# Patient Record
Sex: Male | Born: 1945 | Race: Black or African American | Hispanic: No | Marital: Single | Smoking: Current some day smoker
Health system: Southern US, Community
[De-identification: ages and names within clinical notes are randomized; demographics above are authoritative.]

## PROBLEM LIST (undated history)

## (undated) DIAGNOSIS — F1490 Cocaine use, unspecified, uncomplicated: Secondary | ICD-10-CM

## (undated) DIAGNOSIS — I509 Heart failure, unspecified: Secondary | ICD-10-CM

## (undated) DIAGNOSIS — D696 Thrombocytopenia, unspecified: Secondary | ICD-10-CM

## (undated) DIAGNOSIS — I2510 Atherosclerotic heart disease of native coronary artery without angina pectoris: Secondary | ICD-10-CM

## (undated) DIAGNOSIS — B1920 Unspecified viral hepatitis C without hepatic coma: Secondary | ICD-10-CM

## (undated) DIAGNOSIS — I447 Left bundle-branch block, unspecified: Secondary | ICD-10-CM

## (undated) DIAGNOSIS — I428 Other cardiomyopathies: Secondary | ICD-10-CM

## (undated) DIAGNOSIS — I739 Peripheral vascular disease, unspecified: Secondary | ICD-10-CM

## (undated) DIAGNOSIS — E785 Hyperlipidemia, unspecified: Secondary | ICD-10-CM

## (undated) DIAGNOSIS — Z720 Tobacco use: Secondary | ICD-10-CM

## (undated) DIAGNOSIS — I10 Essential (primary) hypertension: Secondary | ICD-10-CM

## (undated) DIAGNOSIS — T7840XA Allergy, unspecified, initial encounter: Secondary | ICD-10-CM

## (undated) HISTORY — DX: Heart failure, unspecified: I50.9

## (undated) HISTORY — DX: Tobacco use: Z72.0

## (undated) HISTORY — DX: Atherosclerotic heart disease of native coronary artery without angina pectoris: I25.10

## (undated) HISTORY — DX: Cocaine use, unspecified, uncomplicated: F14.90

## (undated) HISTORY — DX: Hyperlipidemia, unspecified: E78.5

## (undated) HISTORY — DX: Allergy, unspecified, initial encounter: T78.40XA

## (undated) HISTORY — PX: OTHER SURGICAL HISTORY: SHX169

## (undated) HISTORY — DX: Unspecified viral hepatitis C without hepatic coma: B19.20

## (undated) HISTORY — DX: Thrombocytopenia, unspecified: D69.6

## (undated) HISTORY — DX: Peripheral vascular disease, unspecified: I73.9

## (undated) HISTORY — DX: Essential (primary) hypertension: I10

## (undated) HISTORY — DX: Left bundle-branch block, unspecified: I44.7

---

## 2005-07-04 ENCOUNTER — Encounter
Admission: RE | Admit: 2005-07-04 | Discharge: 2005-10-02 | Payer: Self-pay | Admitting: Physical Medicine & Rehabilitation

## 2005-08-15 ENCOUNTER — Ambulatory Visit: Payer: Self-pay | Admitting: Physical Medicine & Rehabilitation

## 2005-08-25 ENCOUNTER — Ambulatory Visit: Payer: Self-pay | Admitting: Internal Medicine

## 2005-09-04 ENCOUNTER — Emergency Department (HOSPITAL_COMMUNITY): Admission: EM | Admit: 2005-09-04 | Discharge: 2005-09-05 | Payer: Self-pay | Admitting: Emergency Medicine

## 2005-09-15 ENCOUNTER — Ambulatory Visit: Payer: Self-pay | Admitting: Physical Medicine & Rehabilitation

## 2005-10-06 ENCOUNTER — Encounter: Admission: RE | Admit: 2005-10-06 | Discharge: 2006-01-04 | Payer: Self-pay | Admitting: Internal Medicine

## 2005-10-08 ENCOUNTER — Ambulatory Visit: Payer: Self-pay | Admitting: Internal Medicine

## 2005-10-13 ENCOUNTER — Ambulatory Visit: Payer: Self-pay | Admitting: Physical Medicine & Rehabilitation

## 2005-10-13 ENCOUNTER — Encounter
Admission: RE | Admit: 2005-10-13 | Discharge: 2006-01-11 | Payer: Self-pay | Admitting: Physical Medicine & Rehabilitation

## 2005-10-16 ENCOUNTER — Ambulatory Visit: Payer: Self-pay | Admitting: Internal Medicine

## 2005-11-06 ENCOUNTER — Ambulatory Visit: Payer: Self-pay | Admitting: Physical Medicine & Rehabilitation

## 2005-12-02 ENCOUNTER — Ambulatory Visit (HOSPITAL_COMMUNITY): Admission: RE | Admit: 2005-12-02 | Discharge: 2005-12-02 | Payer: Self-pay | Admitting: Internal Medicine

## 2005-12-02 ENCOUNTER — Encounter (INDEPENDENT_AMBULATORY_CARE_PROVIDER_SITE_OTHER): Payer: Self-pay | Admitting: Cardiology

## 2005-12-02 ENCOUNTER — Encounter (INDEPENDENT_AMBULATORY_CARE_PROVIDER_SITE_OTHER): Payer: Self-pay | Admitting: *Deleted

## 2005-12-08 ENCOUNTER — Ambulatory Visit: Payer: Self-pay | Admitting: Physical Medicine & Rehabilitation

## 2006-01-05 ENCOUNTER — Encounter: Admission: RE | Admit: 2006-01-05 | Discharge: 2006-01-13 | Payer: Self-pay | Admitting: Internal Medicine

## 2006-02-04 ENCOUNTER — Encounter
Admission: RE | Admit: 2006-02-04 | Discharge: 2006-05-05 | Payer: Self-pay | Admitting: Physical Medicine & Rehabilitation

## 2006-02-04 ENCOUNTER — Ambulatory Visit: Payer: Self-pay | Admitting: Physical Medicine & Rehabilitation

## 2006-02-19 ENCOUNTER — Ambulatory Visit: Payer: Self-pay | Admitting: Internal Medicine

## 2006-03-12 ENCOUNTER — Ambulatory Visit: Payer: Self-pay | Admitting: Internal Medicine

## 2006-03-31 ENCOUNTER — Ambulatory Visit: Payer: Self-pay | Admitting: Physical Medicine & Rehabilitation

## 2006-04-22 DIAGNOSIS — I10 Essential (primary) hypertension: Secondary | ICD-10-CM | POA: Insufficient documentation

## 2006-04-22 DIAGNOSIS — J45909 Unspecified asthma, uncomplicated: Secondary | ICD-10-CM | POA: Insufficient documentation

## 2006-04-22 DIAGNOSIS — E785 Hyperlipidemia, unspecified: Secondary | ICD-10-CM | POA: Insufficient documentation

## 2006-04-22 DIAGNOSIS — B182 Chronic viral hepatitis C: Secondary | ICD-10-CM | POA: Insufficient documentation

## 2006-05-13 DIAGNOSIS — J309 Allergic rhinitis, unspecified: Secondary | ICD-10-CM | POA: Insufficient documentation

## 2006-05-27 ENCOUNTER — Encounter
Admission: RE | Admit: 2006-05-27 | Discharge: 2006-08-25 | Payer: Self-pay | Admitting: Physical Medicine & Rehabilitation

## 2006-05-27 ENCOUNTER — Ambulatory Visit: Payer: Self-pay | Admitting: Physical Medicine & Rehabilitation

## 2006-07-07 ENCOUNTER — Ambulatory Visit: Payer: Self-pay | Admitting: Gastroenterology

## 2006-07-22 ENCOUNTER — Ambulatory Visit: Payer: Self-pay | Admitting: Physical Medicine & Rehabilitation

## 2006-09-07 ENCOUNTER — Encounter
Admission: RE | Admit: 2006-09-07 | Discharge: 2006-12-06 | Payer: Self-pay | Admitting: Physical Medicine & Rehabilitation

## 2006-09-07 ENCOUNTER — Ambulatory Visit: Payer: Self-pay | Admitting: Physical Medicine & Rehabilitation

## 2006-09-25 ENCOUNTER — Telehealth: Payer: Self-pay | Admitting: *Deleted

## 2006-10-27 ENCOUNTER — Ambulatory Visit: Payer: Self-pay | Admitting: Physical Medicine & Rehabilitation

## 2006-11-03 ENCOUNTER — Encounter (INDEPENDENT_AMBULATORY_CARE_PROVIDER_SITE_OTHER): Payer: Self-pay | Admitting: *Deleted

## 2006-11-30 ENCOUNTER — Encounter
Admission: RE | Admit: 2006-11-30 | Discharge: 2007-02-28 | Payer: Self-pay | Admitting: Physical Medicine & Rehabilitation

## 2006-11-30 ENCOUNTER — Ambulatory Visit: Payer: Self-pay | Admitting: Physical Medicine & Rehabilitation

## 2007-01-04 ENCOUNTER — Ambulatory Visit: Payer: Self-pay | Admitting: Physical Medicine & Rehabilitation

## 2007-01-29 ENCOUNTER — Telehealth (INDEPENDENT_AMBULATORY_CARE_PROVIDER_SITE_OTHER): Payer: Self-pay | Admitting: Pharmacy Technician

## 2007-02-02 ENCOUNTER — Ambulatory Visit: Payer: Self-pay | Admitting: Infectious Disease

## 2007-02-15 ENCOUNTER — Ambulatory Visit: Payer: Self-pay | Admitting: Internal Medicine

## 2007-02-15 ENCOUNTER — Encounter (INDEPENDENT_AMBULATORY_CARE_PROVIDER_SITE_OTHER): Payer: Self-pay | Admitting: *Deleted

## 2007-02-15 LAB — CONVERTED CEMR LAB
ALT: 38 units/L (ref 0–53)
BUN: 11 mg/dL (ref 6–23)
CO2: 27 meq/L (ref 19–32)
Cholesterol: 138 mg/dL (ref 0–200)
Creatinine, Ser: 0.94 mg/dL (ref 0.40–1.50)
HDL: 30 mg/dL — ABNORMAL LOW (ref >39)
Total Bilirubin: 0.6 mg/dL (ref 0.3–1.2)
Total CHOL/HDL Ratio: 4.6
Triglycerides: 122 mg/dL (ref <150)
VLDL: 24 mg/dL (ref 0–40)

## 2007-02-23 ENCOUNTER — Ambulatory Visit: Payer: Self-pay | Admitting: Hospitalist

## 2007-02-23 DIAGNOSIS — F17200 Nicotine dependence, unspecified, uncomplicated: Secondary | ICD-10-CM | POA: Insufficient documentation

## 2007-02-23 DIAGNOSIS — I739 Peripheral vascular disease, unspecified: Secondary | ICD-10-CM | POA: Insufficient documentation

## 2007-03-05 ENCOUNTER — Encounter
Admission: RE | Admit: 2007-03-05 | Discharge: 2007-06-03 | Payer: Self-pay | Admitting: Physical Medicine & Rehabilitation

## 2007-03-05 ENCOUNTER — Ambulatory Visit: Payer: Self-pay | Admitting: Physical Medicine & Rehabilitation

## 2007-05-06 ENCOUNTER — Ambulatory Visit: Payer: Self-pay | Admitting: Physical Medicine & Rehabilitation

## 2007-06-30 ENCOUNTER — Ambulatory Visit: Payer: Self-pay | Admitting: Physical Medicine & Rehabilitation

## 2007-06-30 ENCOUNTER — Encounter
Admission: RE | Admit: 2007-06-30 | Discharge: 2007-09-28 | Payer: Self-pay | Admitting: Physical Medicine & Rehabilitation

## 2007-07-02 ENCOUNTER — Emergency Department (HOSPITAL_COMMUNITY): Admission: EM | Admit: 2007-07-02 | Discharge: 2007-07-02 | Payer: Self-pay | Admitting: Emergency Medicine

## 2007-07-08 ENCOUNTER — Encounter (INDEPENDENT_AMBULATORY_CARE_PROVIDER_SITE_OTHER): Payer: Self-pay | Admitting: *Deleted

## 2007-07-15 ENCOUNTER — Encounter (INDEPENDENT_AMBULATORY_CARE_PROVIDER_SITE_OTHER): Payer: Self-pay | Admitting: *Deleted

## 2007-08-02 ENCOUNTER — Telehealth (INDEPENDENT_AMBULATORY_CARE_PROVIDER_SITE_OTHER): Payer: Self-pay | Admitting: *Deleted

## 2007-08-10 DIAGNOSIS — E119 Type 2 diabetes mellitus without complications: Secondary | ICD-10-CM

## 2007-08-11 ENCOUNTER — Encounter (INDEPENDENT_AMBULATORY_CARE_PROVIDER_SITE_OTHER): Payer: Self-pay | Admitting: *Deleted

## 2007-08-11 ENCOUNTER — Ambulatory Visit: Payer: Self-pay | Admitting: Cardiology

## 2007-08-11 ENCOUNTER — Inpatient Hospital Stay (HOSPITAL_COMMUNITY): Admission: EM | Admit: 2007-08-11 | Discharge: 2007-08-16 | Payer: Self-pay | Admitting: Emergency Medicine

## 2007-08-11 ENCOUNTER — Ambulatory Visit: Payer: Self-pay | Admitting: *Deleted

## 2007-08-24 ENCOUNTER — Ambulatory Visit: Payer: Self-pay | Admitting: Physical Medicine & Rehabilitation

## 2007-08-26 ENCOUNTER — Ambulatory Visit: Payer: Self-pay | Admitting: Hospitalist

## 2007-08-26 ENCOUNTER — Ambulatory Visit: Payer: Self-pay | Admitting: Infectious Diseases

## 2007-08-26 ENCOUNTER — Encounter (INDEPENDENT_AMBULATORY_CARE_PROVIDER_SITE_OTHER): Payer: Self-pay | Admitting: *Deleted

## 2007-08-26 DIAGNOSIS — D696 Thrombocytopenia, unspecified: Secondary | ICD-10-CM

## 2007-08-26 LAB — CONVERTED CEMR LAB
Blood Glucose, Home Monitor: 2 mg/dL
Eosinophils Absolute: 0.3 10*3/uL (ref 0.0–0.7)
Eosinophils Relative: 4 % (ref 0–5)
HCT: 35.2 % — ABNORMAL LOW (ref 39.0–52.0)
Hemoglobin: 11.7 g/dL — ABNORMAL LOW (ref 13.0–17.0)
Lymphocytes Relative: 30 % (ref 12–46)
Lymphs Abs: 2.3 10*3/uL (ref 0.7–4.0)
MCV: 84.2 fL (ref 78.0–100.0)
Monocytes Absolute: 0.6 10*3/uL (ref 0.1–1.0)
Monocytes Relative: 8 % (ref 3–12)
RBC: 4.18 M/uL — ABNORMAL LOW (ref 4.22–5.81)
WBC: 7.7 10*3/uL (ref 4.0–10.5)

## 2007-09-06 ENCOUNTER — Encounter (INDEPENDENT_AMBULATORY_CARE_PROVIDER_SITE_OTHER): Payer: Self-pay | Admitting: *Deleted

## 2007-09-10 ENCOUNTER — Ambulatory Visit (HOSPITAL_COMMUNITY): Admission: RE | Admit: 2007-09-10 | Discharge: 2007-09-10 | Payer: Self-pay | Admitting: Ophthalmology

## 2007-09-14 ENCOUNTER — Encounter (INDEPENDENT_AMBULATORY_CARE_PROVIDER_SITE_OTHER): Payer: Self-pay | Admitting: *Deleted

## 2007-09-23 ENCOUNTER — Encounter (INDEPENDENT_AMBULATORY_CARE_PROVIDER_SITE_OTHER): Payer: Self-pay | Admitting: *Deleted

## 2007-09-23 ENCOUNTER — Ambulatory Visit: Payer: Self-pay | Admitting: Physical Medicine & Rehabilitation

## 2007-09-23 DIAGNOSIS — I509 Heart failure, unspecified: Secondary | ICD-10-CM | POA: Insufficient documentation

## 2007-09-24 ENCOUNTER — Telehealth (INDEPENDENT_AMBULATORY_CARE_PROVIDER_SITE_OTHER): Payer: Self-pay | Admitting: *Deleted

## 2007-09-27 ENCOUNTER — Encounter (INDEPENDENT_AMBULATORY_CARE_PROVIDER_SITE_OTHER): Payer: Self-pay | Admitting: *Deleted

## 2007-10-15 ENCOUNTER — Encounter
Admission: RE | Admit: 2007-10-15 | Discharge: 2008-01-13 | Payer: Self-pay | Admitting: Physical Medicine & Rehabilitation

## 2007-10-19 ENCOUNTER — Ambulatory Visit: Payer: Self-pay | Admitting: Physical Medicine & Rehabilitation

## 2007-10-27 ENCOUNTER — Ambulatory Visit: Payer: Self-pay | Admitting: *Deleted

## 2007-11-16 ENCOUNTER — Ambulatory Visit: Payer: Self-pay | Admitting: Physical Medicine & Rehabilitation

## 2007-11-30 ENCOUNTER — Ambulatory Visit: Payer: Self-pay | Admitting: Internal Medicine

## 2007-12-14 ENCOUNTER — Ambulatory Visit: Payer: Self-pay | Admitting: Physical Medicine & Rehabilitation

## 2007-12-15 ENCOUNTER — Telehealth: Payer: Self-pay | Admitting: *Deleted

## 2008-01-07 ENCOUNTER — Encounter
Admission: RE | Admit: 2008-01-07 | Discharge: 2008-03-08 | Payer: Self-pay | Admitting: Physical Medicine & Rehabilitation

## 2008-01-10 ENCOUNTER — Ambulatory Visit: Payer: Self-pay | Admitting: Physical Medicine & Rehabilitation

## 2008-01-17 ENCOUNTER — Telehealth (INDEPENDENT_AMBULATORY_CARE_PROVIDER_SITE_OTHER): Payer: Self-pay | Admitting: *Deleted

## 2008-01-24 ENCOUNTER — Encounter (INDEPENDENT_AMBULATORY_CARE_PROVIDER_SITE_OTHER): Payer: Self-pay | Admitting: *Deleted

## 2008-01-25 ENCOUNTER — Ambulatory Visit: Payer: Self-pay | Admitting: *Deleted

## 2008-01-25 ENCOUNTER — Telehealth (INDEPENDENT_AMBULATORY_CARE_PROVIDER_SITE_OTHER): Payer: Self-pay | Admitting: *Deleted

## 2008-01-27 ENCOUNTER — Encounter (INDEPENDENT_AMBULATORY_CARE_PROVIDER_SITE_OTHER): Payer: Self-pay | Admitting: *Deleted

## 2008-02-04 ENCOUNTER — Encounter (INDEPENDENT_AMBULATORY_CARE_PROVIDER_SITE_OTHER): Payer: Self-pay | Admitting: *Deleted

## 2008-02-08 ENCOUNTER — Ambulatory Visit: Payer: Self-pay | Admitting: Physical Medicine & Rehabilitation

## 2008-02-18 ENCOUNTER — Encounter (INDEPENDENT_AMBULATORY_CARE_PROVIDER_SITE_OTHER): Payer: Self-pay | Admitting: *Deleted

## 2008-03-01 ENCOUNTER — Encounter (INDEPENDENT_AMBULATORY_CARE_PROVIDER_SITE_OTHER): Payer: Self-pay | Admitting: *Deleted

## 2008-03-08 ENCOUNTER — Ambulatory Visit: Payer: Self-pay | Admitting: Physical Medicine & Rehabilitation

## 2008-03-14 ENCOUNTER — Encounter (INDEPENDENT_AMBULATORY_CARE_PROVIDER_SITE_OTHER): Payer: Self-pay | Admitting: *Deleted

## 2008-04-04 ENCOUNTER — Encounter
Admission: RE | Admit: 2008-04-04 | Discharge: 2008-07-03 | Payer: Self-pay | Admitting: Physical Medicine & Rehabilitation

## 2008-04-05 ENCOUNTER — Ambulatory Visit: Payer: Self-pay | Admitting: Physical Medicine & Rehabilitation

## 2008-05-03 ENCOUNTER — Ambulatory Visit: Payer: Self-pay | Admitting: Physical Medicine & Rehabilitation

## 2008-05-31 ENCOUNTER — Ambulatory Visit: Payer: Self-pay | Admitting: Physical Medicine & Rehabilitation

## 2008-06-08 ENCOUNTER — Encounter (INDEPENDENT_AMBULATORY_CARE_PROVIDER_SITE_OTHER): Payer: Self-pay | Admitting: *Deleted

## 2008-06-30 ENCOUNTER — Telehealth: Payer: Self-pay | Admitting: Infectious Disease

## 2008-07-03 ENCOUNTER — Encounter
Admission: RE | Admit: 2008-07-03 | Discharge: 2008-10-01 | Payer: Self-pay | Admitting: Physical Medicine & Rehabilitation

## 2008-07-04 ENCOUNTER — Ambulatory Visit: Payer: Self-pay | Admitting: Physical Medicine & Rehabilitation

## 2008-07-07 ENCOUNTER — Encounter (INDEPENDENT_AMBULATORY_CARE_PROVIDER_SITE_OTHER): Payer: Self-pay | Admitting: *Deleted

## 2008-07-07 ENCOUNTER — Ambulatory Visit: Payer: Self-pay | Admitting: Internal Medicine

## 2008-07-07 LAB — CONVERTED CEMR LAB
ALT: 42 units/L (ref 0–53)
Albumin: 4 g/dL (ref 3.5–5.2)
Blood Glucose, Fingerstick: 87
CO2: 25 meq/L (ref 19–32)
Cholesterol: 115 mg/dL (ref 0–200)
Glucose, Bld: 86 mg/dL (ref 70–99)
Hgb A1c MFr Bld: 6.3 %
LDL Cholesterol: 52 mg/dL (ref 0–99)
Potassium: 3.9 meq/L (ref 3.5–5.3)
Sodium: 143 meq/L (ref 135–145)
Total Bilirubin: 0.4 mg/dL (ref 0.3–1.2)
Total Protein: 6.6 g/dL (ref 6.0–8.3)
Triglycerides: 162 mg/dL — ABNORMAL HIGH (ref <150)
VLDL: 32 mg/dL (ref 0–40)

## 2008-08-01 ENCOUNTER — Ambulatory Visit: Payer: Self-pay | Admitting: Physical Medicine & Rehabilitation

## 2008-08-07 ENCOUNTER — Encounter (INDEPENDENT_AMBULATORY_CARE_PROVIDER_SITE_OTHER): Payer: Self-pay | Admitting: *Deleted

## 2008-09-01 ENCOUNTER — Ambulatory Visit: Payer: Self-pay | Admitting: Physical Medicine & Rehabilitation

## 2008-09-11 ENCOUNTER — Encounter (INDEPENDENT_AMBULATORY_CARE_PROVIDER_SITE_OTHER): Payer: Self-pay | Admitting: *Deleted

## 2008-09-15 ENCOUNTER — Encounter (INDEPENDENT_AMBULATORY_CARE_PROVIDER_SITE_OTHER): Payer: Self-pay | Admitting: *Deleted

## 2008-10-12 ENCOUNTER — Emergency Department (HOSPITAL_COMMUNITY): Admission: EM | Admit: 2008-10-12 | Discharge: 2008-10-12 | Payer: Self-pay | Admitting: Emergency Medicine

## 2008-10-19 ENCOUNTER — Ambulatory Visit: Payer: Self-pay | Admitting: Infectious Disease

## 2008-10-19 ENCOUNTER — Encounter (INDEPENDENT_AMBULATORY_CARE_PROVIDER_SITE_OTHER): Payer: Self-pay | Admitting: *Deleted

## 2008-10-19 LAB — CONVERTED CEMR LAB
Blood Glucose, Fingerstick: 143
Hgb A1c MFr Bld: 6.3 %

## 2008-10-31 ENCOUNTER — Encounter (INDEPENDENT_AMBULATORY_CARE_PROVIDER_SITE_OTHER): Payer: Self-pay | Admitting: *Deleted

## 2008-11-15 ENCOUNTER — Telehealth (INDEPENDENT_AMBULATORY_CARE_PROVIDER_SITE_OTHER): Payer: Self-pay | Admitting: *Deleted

## 2008-11-16 ENCOUNTER — Encounter (INDEPENDENT_AMBULATORY_CARE_PROVIDER_SITE_OTHER): Payer: Self-pay | Admitting: *Deleted

## 2008-12-19 ENCOUNTER — Telehealth: Payer: Self-pay | Admitting: *Deleted

## 2008-12-19 ENCOUNTER — Encounter: Payer: Self-pay | Admitting: Internal Medicine

## 2009-01-16 ENCOUNTER — Telehealth: Payer: Self-pay | Admitting: *Deleted

## 2009-01-17 ENCOUNTER — Encounter: Payer: Self-pay | Admitting: Internal Medicine

## 2009-01-18 ENCOUNTER — Encounter (INDEPENDENT_AMBULATORY_CARE_PROVIDER_SITE_OTHER): Payer: Self-pay | Admitting: Internal Medicine

## 2009-01-18 ENCOUNTER — Ambulatory Visit: Payer: Self-pay | Admitting: Internal Medicine

## 2009-01-18 LAB — CONVERTED CEMR LAB
Amphetamine Screen, Ur: NEGATIVE
Barbiturate Quant, Ur: NEGATIVE
Benzodiazepines.: NEGATIVE
Cocaine Metabolites: NEGATIVE
Creatinine,U: 50.6 mg/dL
Methadone: NEGATIVE

## 2009-01-22 ENCOUNTER — Encounter (INDEPENDENT_AMBULATORY_CARE_PROVIDER_SITE_OTHER): Payer: Self-pay | Admitting: Internal Medicine

## 2009-02-12 ENCOUNTER — Telehealth (INDEPENDENT_AMBULATORY_CARE_PROVIDER_SITE_OTHER): Payer: Self-pay | Admitting: Internal Medicine

## 2009-02-12 ENCOUNTER — Encounter (INDEPENDENT_AMBULATORY_CARE_PROVIDER_SITE_OTHER): Payer: Self-pay | Admitting: Internal Medicine

## 2009-02-27 ENCOUNTER — Telehealth (INDEPENDENT_AMBULATORY_CARE_PROVIDER_SITE_OTHER): Payer: Self-pay | Admitting: *Deleted

## 2009-03-16 ENCOUNTER — Ambulatory Visit (HOSPITAL_COMMUNITY): Admission: RE | Admit: 2009-03-16 | Discharge: 2009-03-16 | Payer: Self-pay | Admitting: Ophthalmology

## 2009-03-19 ENCOUNTER — Telehealth: Payer: Self-pay | Admitting: *Deleted

## 2009-03-21 ENCOUNTER — Ambulatory Visit: Payer: Self-pay | Admitting: Internal Medicine

## 2009-03-21 LAB — CONVERTED CEMR LAB
Benzodiazepines.: NEGATIVE
Creatinine,U: 79.4 mg/dL
Methadone: NEGATIVE
Phencyclidine (PCP): NEGATIVE
Propoxyphene: NEGATIVE

## 2009-04-11 ENCOUNTER — Encounter: Payer: Self-pay | Admitting: Internal Medicine

## 2009-04-18 ENCOUNTER — Telehealth (INDEPENDENT_AMBULATORY_CARE_PROVIDER_SITE_OTHER): Payer: Self-pay | Admitting: *Deleted

## 2009-04-26 ENCOUNTER — Ambulatory Visit: Payer: Self-pay | Admitting: Infectious Disease

## 2009-04-26 LAB — CONVERTED CEMR LAB
Blood Glucose, Fingerstick: 95
Hgb A1c MFr Bld: 6.3 %

## 2009-05-03 ENCOUNTER — Encounter (INDEPENDENT_AMBULATORY_CARE_PROVIDER_SITE_OTHER): Payer: Self-pay | Admitting: Internal Medicine

## 2009-05-21 ENCOUNTER — Ambulatory Visit: Payer: Self-pay | Admitting: Internal Medicine

## 2009-05-21 ENCOUNTER — Telehealth: Payer: Self-pay | Admitting: Internal Medicine

## 2009-05-21 ENCOUNTER — Encounter: Payer: Self-pay | Admitting: Internal Medicine

## 2009-05-21 LAB — CONVERTED CEMR LAB
Amphetamine Screen, Ur: NEGATIVE
Barbiturate Quant, Ur: NEGATIVE
Benzodiazepines.: NEGATIVE
Creatinine,U: 135.5 mg/dL
Methadone: NEGATIVE
Propoxyphene: NEGATIVE

## 2009-05-23 LAB — CONVERTED CEMR LAB
BUN: 8 mg/dL (ref 6–23)
CO2: 28 meq/L (ref 19–32)
Calcium: 9.2 mg/dL (ref 8.4–10.5)
Creatinine, Ser: 1.09 mg/dL (ref 0.40–1.50)
Glucose, Bld: 83 mg/dL (ref 70–99)
Microalb, Ur: 27.88 mg/dL — ABNORMAL HIGH (ref 0.00–1.89)

## 2009-06-12 ENCOUNTER — Encounter: Payer: Self-pay | Admitting: Internal Medicine

## 2009-06-19 ENCOUNTER — Telehealth (INDEPENDENT_AMBULATORY_CARE_PROVIDER_SITE_OTHER): Payer: Self-pay | Admitting: *Deleted

## 2009-07-17 ENCOUNTER — Telehealth: Payer: Self-pay | Admitting: Internal Medicine

## 2009-07-19 ENCOUNTER — Encounter (INDEPENDENT_AMBULATORY_CARE_PROVIDER_SITE_OTHER): Payer: Self-pay | Admitting: Internal Medicine

## 2009-07-20 ENCOUNTER — Encounter (INDEPENDENT_AMBULATORY_CARE_PROVIDER_SITE_OTHER): Payer: Self-pay | Admitting: Internal Medicine

## 2009-07-20 ENCOUNTER — Encounter: Payer: Self-pay | Admitting: Internal Medicine

## 2009-08-16 ENCOUNTER — Telehealth: Payer: Self-pay | Admitting: *Deleted

## 2009-08-17 ENCOUNTER — Telehealth (INDEPENDENT_AMBULATORY_CARE_PROVIDER_SITE_OTHER): Payer: Self-pay | Admitting: Internal Medicine

## 2009-08-20 ENCOUNTER — Encounter: Payer: Self-pay | Admitting: Internal Medicine

## 2009-08-20 ENCOUNTER — Telehealth (INDEPENDENT_AMBULATORY_CARE_PROVIDER_SITE_OTHER): Payer: Self-pay | Admitting: *Deleted

## 2009-09-07 ENCOUNTER — Ambulatory Visit: Payer: Self-pay | Admitting: Internal Medicine

## 2009-09-07 ENCOUNTER — Encounter (INDEPENDENT_AMBULATORY_CARE_PROVIDER_SITE_OTHER): Payer: Self-pay | Admitting: Internal Medicine

## 2009-09-07 ENCOUNTER — Encounter (INDEPENDENT_AMBULATORY_CARE_PROVIDER_SITE_OTHER): Payer: Self-pay | Admitting: *Deleted

## 2009-09-07 LAB — CONVERTED CEMR LAB
BUN: 6 mg/dL (ref 6–23)
Blood Glucose, Fingerstick: 81
Calcium: 8.6 mg/dL (ref 8.4–10.5)
Chloride: 105 meq/L (ref 96–112)
Creatinine, Ser: 1.09 mg/dL (ref 0.40–1.50)
Creatinine,U: 59.3 mg/dL
Hgb A1c MFr Bld: 7.4 %
Opiates: POSITIVE — AB
Phencyclidine (PCP): NEGATIVE
Propoxyphene: NEGATIVE

## 2009-09-18 ENCOUNTER — Ambulatory Visit: Payer: Self-pay | Admitting: Internal Medicine

## 2009-09-20 ENCOUNTER — Ambulatory Visit: Payer: Self-pay | Admitting: Internal Medicine

## 2009-09-20 ENCOUNTER — Encounter (INDEPENDENT_AMBULATORY_CARE_PROVIDER_SITE_OTHER): Payer: Self-pay | Admitting: *Deleted

## 2009-09-26 ENCOUNTER — Encounter (INDEPENDENT_AMBULATORY_CARE_PROVIDER_SITE_OTHER): Payer: Self-pay | Admitting: *Deleted

## 2009-09-28 ENCOUNTER — Encounter: Payer: Self-pay | Admitting: Internal Medicine

## 2009-09-28 ENCOUNTER — Telehealth (INDEPENDENT_AMBULATORY_CARE_PROVIDER_SITE_OTHER): Payer: Self-pay | Admitting: *Deleted

## 2009-10-01 ENCOUNTER — Encounter: Payer: Self-pay | Admitting: Internal Medicine

## 2009-10-01 ENCOUNTER — Telehealth: Payer: Self-pay | Admitting: Internal Medicine

## 2009-10-02 ENCOUNTER — Ambulatory Visit: Payer: Self-pay | Admitting: Internal Medicine

## 2009-10-02 LAB — CONVERTED CEMR LAB: Pro B Natriuretic peptide (BNP): 160 pg/mL — ABNORMAL HIGH (ref 0.0–100.0)

## 2009-10-03 ENCOUNTER — Encounter (INDEPENDENT_AMBULATORY_CARE_PROVIDER_SITE_OTHER): Payer: Self-pay | Admitting: Internal Medicine

## 2009-10-09 ENCOUNTER — Ambulatory Visit: Payer: Self-pay | Admitting: Internal Medicine

## 2009-10-09 ENCOUNTER — Encounter (INDEPENDENT_AMBULATORY_CARE_PROVIDER_SITE_OTHER): Payer: Self-pay | Admitting: Internal Medicine

## 2009-10-09 ENCOUNTER — Encounter: Payer: Self-pay | Admitting: Internal Medicine

## 2009-10-09 ENCOUNTER — Ambulatory Visit (HOSPITAL_COMMUNITY): Admission: RE | Admit: 2009-10-09 | Discharge: 2009-10-09 | Payer: Self-pay | Admitting: Internal Medicine

## 2009-10-09 ENCOUNTER — Ambulatory Visit: Payer: Self-pay | Admitting: Vascular Surgery

## 2009-10-10 ENCOUNTER — Encounter (INDEPENDENT_AMBULATORY_CARE_PROVIDER_SITE_OTHER): Payer: Self-pay | Admitting: *Deleted

## 2009-10-10 ENCOUNTER — Encounter (INDEPENDENT_AMBULATORY_CARE_PROVIDER_SITE_OTHER): Payer: Self-pay | Admitting: Internal Medicine

## 2009-10-10 ENCOUNTER — Encounter: Payer: Self-pay | Admitting: Internal Medicine

## 2009-10-17 ENCOUNTER — Encounter (INDEPENDENT_AMBULATORY_CARE_PROVIDER_SITE_OTHER): Payer: Self-pay | Admitting: Internal Medicine

## 2009-10-17 ENCOUNTER — Ambulatory Visit: Payer: Self-pay | Admitting: Internal Medicine

## 2009-10-18 ENCOUNTER — Telehealth: Payer: Self-pay | Admitting: *Deleted

## 2009-10-19 ENCOUNTER — Ambulatory Visit: Payer: Self-pay | Admitting: Internal Medicine

## 2009-10-19 LAB — CONVERTED CEMR LAB
Amphetamine Screen, Ur: NEGATIVE
Barbiturate Quant, Ur: NEGATIVE
Benzodiazepines.: NEGATIVE
Cocaine Metabolites: NEGATIVE
Creatinine,U: 180.2 mg/dL
Marijuana Metabolite: NEGATIVE
Methadone: NEGATIVE
Opiates: POSITIVE — AB

## 2009-10-23 ENCOUNTER — Ambulatory Visit: Payer: Self-pay | Admitting: Internal Medicine

## 2009-10-25 ENCOUNTER — Ambulatory Visit (HOSPITAL_COMMUNITY): Admission: RE | Admit: 2009-10-25 | Discharge: 2009-10-25 | Payer: Self-pay | Admitting: Internal Medicine

## 2009-10-30 ENCOUNTER — Encounter (INDEPENDENT_AMBULATORY_CARE_PROVIDER_SITE_OTHER): Payer: Self-pay | Admitting: *Deleted

## 2009-11-02 ENCOUNTER — Ambulatory Visit: Payer: Self-pay | Admitting: Internal Medicine

## 2009-11-12 ENCOUNTER — Telehealth (INDEPENDENT_AMBULATORY_CARE_PROVIDER_SITE_OTHER): Payer: Self-pay | Admitting: *Deleted

## 2009-11-14 ENCOUNTER — Telehealth (INDEPENDENT_AMBULATORY_CARE_PROVIDER_SITE_OTHER): Payer: Self-pay | Admitting: Internal Medicine

## 2009-11-16 ENCOUNTER — Encounter (INDEPENDENT_AMBULATORY_CARE_PROVIDER_SITE_OTHER): Payer: Self-pay | Admitting: Internal Medicine

## 2009-12-05 ENCOUNTER — Telehealth: Payer: Self-pay | Admitting: Internal Medicine

## 2009-12-17 ENCOUNTER — Telehealth: Payer: Self-pay | Admitting: Internal Medicine

## 2009-12-18 ENCOUNTER — Encounter (INDEPENDENT_AMBULATORY_CARE_PROVIDER_SITE_OTHER): Payer: Self-pay | Admitting: *Deleted

## 2009-12-19 ENCOUNTER — Encounter: Payer: Self-pay | Admitting: Internal Medicine

## 2010-01-03 ENCOUNTER — Telehealth: Payer: Self-pay | Admitting: Internal Medicine

## 2010-01-08 ENCOUNTER — Encounter: Payer: Self-pay | Admitting: Internal Medicine

## 2010-01-16 ENCOUNTER — Telehealth: Payer: Self-pay | Admitting: Internal Medicine

## 2010-01-18 ENCOUNTER — Encounter: Payer: Self-pay | Admitting: Internal Medicine

## 2010-01-25 ENCOUNTER — Encounter: Payer: Self-pay | Admitting: Internal Medicine

## 2010-02-15 ENCOUNTER — Telehealth: Payer: Self-pay | Admitting: Internal Medicine

## 2010-02-15 ENCOUNTER — Encounter: Payer: Self-pay | Admitting: Internal Medicine

## 2010-02-28 ENCOUNTER — Telehealth: Payer: Self-pay | Admitting: Internal Medicine

## 2010-03-04 ENCOUNTER — Ambulatory Visit: Payer: Self-pay | Admitting: Internal Medicine

## 2010-03-04 LAB — CONVERTED CEMR LAB
AST: 42 units/L — ABNORMAL HIGH (ref 0–37)
Albumin: 3.7 g/dL (ref 3.5–5.2)
Amphetamine Screen, Ur: NEGATIVE
BUN: 6 mg/dL (ref 6–23)
Barbiturate Quant, Ur: NEGATIVE
Calcium: 9.1 mg/dL (ref 8.4–10.5)
Chloride: 103 meq/L (ref 96–112)
Cocaine Metabolites: NEGATIVE
Creatinine, Ser: 1.02 mg/dL (ref 0.40–1.50)
Eosinophils Relative: 5 % (ref 0–5)
Glucose, Bld: 122 mg/dL — ABNORMAL HIGH (ref 70–99)
HCT: 44.2 % (ref 39.0–52.0)
Hemoglobin: 14.4 g/dL (ref 13.0–17.0)
Hgb A1c MFr Bld: 7.5 %
Lymphocytes Relative: 43 % (ref 12–46)
Lymphs Abs: 3.7 10*3/uL (ref 0.7–4.0)
Marijuana Metabolite: NEGATIVE
Methadone: NEGATIVE
Monocytes Absolute: 0.7 10*3/uL (ref 0.1–1.0)
Monocytes Relative: 9 % (ref 3–12)
Neutro Abs: 3.7 10*3/uL (ref 1.7–7.7)
Opiates: POSITIVE — AB
Potassium: 3.6 meq/L (ref 3.5–5.3)
RBC: 5.49 M/uL (ref 4.22–5.81)
RDW: 14.1 % (ref 11.5–15.5)

## 2010-03-12 ENCOUNTER — Telehealth: Payer: Self-pay | Admitting: Internal Medicine

## 2010-03-18 ENCOUNTER — Telehealth: Payer: Self-pay | Admitting: Internal Medicine

## 2010-03-18 ENCOUNTER — Encounter: Payer: Self-pay | Admitting: Internal Medicine

## 2010-03-22 ENCOUNTER — Telehealth: Payer: Self-pay | Admitting: Internal Medicine

## 2010-03-28 ENCOUNTER — Telehealth: Payer: Self-pay | Admitting: Internal Medicine

## 2010-04-08 ENCOUNTER — Ambulatory Visit: Payer: Self-pay | Admitting: Internal Medicine

## 2010-04-08 LAB — CONVERTED CEMR LAB
Amphetamine Screen, Ur: NEGATIVE
Barbiturate Quant, Ur: NEGATIVE
Benzodiazepines.: NEGATIVE
Benzodiazepines.: NEGATIVE
Cocaine Metabolites: NEGATIVE
Methadone: NEGATIVE
Opiates: POSITIVE
Phencyclidine (PCP): NEGATIVE
Propoxyphene: NEGATIVE
Specific Gravity, Urine: NORMAL
pH: NORMAL

## 2010-04-09 ENCOUNTER — Encounter: Payer: Self-pay | Admitting: Internal Medicine

## 2010-04-11 ENCOUNTER — Telehealth: Payer: Self-pay | Admitting: Internal Medicine

## 2010-04-11 ENCOUNTER — Ambulatory Visit: Admission: RE | Admit: 2010-04-11 | Discharge: 2010-04-11 | Payer: Self-pay | Admitting: Internal Medicine

## 2010-04-11 ENCOUNTER — Encounter: Payer: Self-pay | Admitting: Internal Medicine

## 2010-04-11 ENCOUNTER — Ambulatory Visit: Payer: Self-pay | Admitting: Surgery

## 2010-04-11 ENCOUNTER — Ambulatory Visit: Payer: Self-pay | Admitting: Internal Medicine

## 2010-04-11 LAB — CONVERTED CEMR LAB
MCHC: 33.7 g/dL (ref 30.0–36.0)
MCV: 80 fL (ref >=78.0)
Platelets: 223 10*3/uL (ref 150–400)
WBC: 9.1 10*3/uL (ref 4.0–10.5)

## 2010-04-17 ENCOUNTER — Encounter: Payer: Self-pay | Admitting: Internal Medicine

## 2010-04-17 ENCOUNTER — Telehealth: Payer: Self-pay | Admitting: Internal Medicine

## 2010-04-29 ENCOUNTER — Ambulatory Visit: Payer: Self-pay | Admitting: Internal Medicine

## 2010-04-30 LAB — CONVERTED CEMR LAB
BUN: 7 mg/dL (ref 6–23)
Calcium: 9 mg/dL (ref 8.4–10.5)
Creatinine, Urine: 61.3 mg/dL
Glucose, Bld: 276 mg/dL — ABNORMAL HIGH (ref 70–99)
Microalb, Ur: 58.41 mg/dL — ABNORMAL HIGH (ref 0.00–1.89)
Sodium: 140 meq/L (ref 135–145)

## 2010-05-20 ENCOUNTER — Ambulatory Visit: Payer: Self-pay | Admitting: Internal Medicine

## 2010-05-20 DIAGNOSIS — I2510 Atherosclerotic heart disease of native coronary artery without angina pectoris: Secondary | ICD-10-CM | POA: Insufficient documentation

## 2010-05-20 LAB — CONVERTED CEMR LAB: Blood Glucose, Fingerstick: 185

## 2010-05-21 ENCOUNTER — Encounter: Payer: Self-pay | Admitting: Internal Medicine

## 2010-05-21 ENCOUNTER — Telehealth: Payer: Self-pay | Admitting: *Deleted

## 2010-05-21 LAB — CONVERTED CEMR LAB: Cholesterol: 130 mg/dL (ref 0–200)

## 2010-05-31 ENCOUNTER — Encounter: Payer: Self-pay | Admitting: Internal Medicine

## 2010-06-13 ENCOUNTER — Encounter
Admission: RE | Admit: 2010-06-13 | Discharge: 2010-06-13 | Payer: Self-pay | Source: Home / Self Care | Attending: Cardiovascular Disease | Admitting: Cardiovascular Disease

## 2010-06-18 ENCOUNTER — Encounter: Payer: Self-pay | Admitting: Internal Medicine

## 2010-06-18 ENCOUNTER — Telehealth: Payer: Self-pay | Admitting: Internal Medicine

## 2010-06-20 ENCOUNTER — Ambulatory Visit (HOSPITAL_COMMUNITY)
Admission: RE | Admit: 2010-06-20 | Discharge: 2010-06-20 | Payer: Self-pay | Source: Home / Self Care | Attending: Cardiovascular Disease | Admitting: Cardiovascular Disease

## 2010-07-17 ENCOUNTER — Telehealth: Payer: Self-pay | Admitting: *Deleted

## 2010-07-19 LAB — CBC
HCT: 40.2 % (ref 39.0–52.0)
MCHC: 33.6 g/dL (ref 30.0–36.0)
MCV: 80.6 fL (ref 78.0–100.0)
RDW: 14 % (ref 11.5–15.5)

## 2010-07-19 LAB — BASIC METABOLIC PANEL
BUN: 4 mg/dL — ABNORMAL LOW (ref 6–23)
CO2: 28 mEq/L (ref 19–32)
Calcium: 9.2 mg/dL (ref 8.4–10.5)
GFR calc non Af Amer: >60 mL/min (ref >60)
Glucose, Bld: 109 mg/dL — ABNORMAL HIGH (ref 70–99)
Potassium: 3.5 mEq/L (ref 3.5–5.1)

## 2010-07-22 ENCOUNTER — Ambulatory Visit (HOSPITAL_COMMUNITY)
Admission: RE | Admit: 2010-07-22 | Discharge: 2010-07-22 | Payer: Self-pay | Source: Home / Self Care | Attending: Oral Surgery | Admitting: Oral Surgery

## 2010-07-22 LAB — GLUCOSE, CAPILLARY

## 2010-07-23 NOTE — Op Note (Signed)
NAME:  Jonathon Galvan, Jonathon Galvan NO.:  [id number]  MEDICAL RECORD NO.:  [id number]          PATIENT TYPE:  AMB  LOCATION:  SDS                          FACILITY:  [facility]  PHYSICIAN:  Georgia Lopes, M.D.  DATE OF BIRTH:  [birth year]  DATE OF PROCEDURE:  07/22/2010 DATE OF DISCHARGE:  07/22/2010                              OPERATIVE REPORT   PREOPERATIVE DIAGNOSIS:  Nonrestorable teeth #22, #23, #24, #25, #26, bilateral mandibular tori.  POSTOPERATIVE DIAGNOSIS:  Nonrestorable teeth #22, #23, #24, #25, #26, bilateral mandibular tori plus nonrestorable teeth #20 and #27.  PROCEDURES:  Removal of teeth #20, #22, #23, #24, #25, #26, #27, removal of bilateral mandibular lingual tori.  SURGEON:  Georgia Lopes, MD  ANESTHESIA:  General nasal.  ASSISTANTS:  Luberta Mutter, DOMA and Marlana Latus, [place].  INDICATIONS FOR PROCEDURE:  The patient is a 65 year old white male with cardiomyopathy, congestive heart failure, history of hypertension, non- insulin-dependent diabetes who presented to the office at the request of his general dentist for removal of multiple nonrestorable lower teeth and reduction of bilateral mandibular tori, so that his lower denture could be fabricated.  Because of the patient's need for general anesthesia and medical risk, it was elected to perform the procedure at [facility], so an endotracheal tube could be used for airway protection.  PROCEDURE:  The patient was taken to the operating room, placed on the table in a supine position.  General anesthesia was administered intravenously and a nasal endotracheal tube was placed and secured.  The eyes were protected.  The patient was draped for the procedure. Posterior pharynx was suctioned.  Throat pack was placed.  2% lidocaine in 1:100,000 epinephrine was infiltrated in inferior alveolar block on the right and left side and in buccal and lingual infiltration around the anterior mandible.  Total of  16 mL was utilized.  Then, a #15 blade was used to make full-thickness incision overlying the crest of the alveolar ridge.  Skin in the area of approximately tooth #19 carried anteriorly and around teeth #22, #23, #24, #25, and #26.  The periosteum was reflected buccally and lingually with a periosteal elevator.  Root of tooth #20 was then visualized below the gingiva and tooth #27 was also visualized when the reflection was carried on to the right side.  A striker handpiece with a crosscut fissure bur was used to remove bone around teeth #20, #22, #27.  Then, the teeth were elevated with a 301 elevator and removed from the mouth with the Ashe forceps.  The sockets were then curetted.  Then, the periosteum was further reflected lingually and buccally to allow for smoothening of the alveolar ridge. Then, a Selden retractor was placed lingually and the bony torus was removed using an egg-shaped bur under copious irrigation.  Then, the area was further smoothed with a bone file and then irrigated and closed up to the midpoint over the midline with 3-0 chromic.  Then, the bite block was repositioned and attention was turned to the right side of the mouth.  The dissection was used with a #15 blade to carry more proximally  from the initial incision to the area of the first molar on the right side.  The periosteum was reflected with a periosteal elevator.  Bone was removed with an egg-shaped bur to smooth the alveolus on the right side and then the bony torus was removed with the egg-shaped bur and the bone file.  The area was irrigated and closed with 3-0 chromic.  The patient was irrigated and suctioned.  This throat pack was removed.  The patient was awakened, extubated, taken to the recovery room breathing spontaneously in good condition.  ESTIMATED BLOOD LOSS:  Minimum.  COMPLICATIONS:  None.  SPECIMENS:  None.     Georgia Lopes, M.D.     SMJ/MEDQ  D:  07/22/2010  T:   07/22/2010  Job:  [id number]  Electronically Signed by Ocie Doyne M.D. on 07/23/2010 07:22:47 AM

## 2010-07-25 NOTE — Assessment & Plan Note (Signed)
Summary: EST-ROUTINE CHECKUP/CH   Vital Signs:  Patient profile:   65 year old male Height:      69.5 inches (176.53 cm) Weight:      217 pounds (98.64 kg) BMI:     31.70 Temp:     97 degrees F (36.11 degrees C) oral Pulse rate:   65 / minute BP sitting:   133 / 72  (right arm)  Vitals Entered By: Angelina Ok RN (September 07, 2009 2:16 PM) Is Patient Diabetic? Yes Did you bring your meter with you today? No Pain Assessment Patient in pain? no      Nutritional Status BMI of > 30 = obese CBG Result 81  Have you ever been in a relationship where you felt threatened, hurt or afraid?No   Does patient need assistance? Functional Status Self care Ambulation Impaired:Risk for fall Comments Uses a walker.  Check up.   Primary Care Provider:  Olene Craven MD   History of Present Illness: Pt is a 65 yo M with DM, HTN, HLD, CHF, PVD who is here for a f/u.  Pt has been taking his DM meds a prescribed.  Denies polyuria/polydipsia.  Also denies any symptoms of hypoglycemia.  His lowest CBG was 86.  He is mostly in the low 100's.  He has an appointment on Tuesday with Dr. Mitzi Davenport.  He has had cataract surgery on his left eye 1 yr ago.  He denies parasthesias or numbness in his lower extremities.  He checks his feet and has not seen any sores.  He has a home health person who comes to help him around the house. He has been taking his BP meds regularly. He denies CP, swelling in his legs, shortness of breath.  No PND/orthopnea. 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  He takes MS contin and percocet which helps him with his pain.  Pain is pretty well controlled on current regimen.  Depression History:      The patient denies a depressed mood most of the day and a diminished interest in his usual daily activities.         Preventive Screening-Counseling & Management  Alcohol-Tobacco     Smoking Status: current     Smoking Cessation Counseling: yes     Packs/Day:  0.5  Current Medications (verified): 1)  Plavix 75 Mg Tabs (Clopidogrel Bisulfate) .... Take One Tablet By Mouth Daily 2)  Enalapril Maleate 20 Mg Tabs (Enalapril Maleate) .... Take One Tablet By Mouth Daily 3)  Norvasc 10 Mg Tabs (Amlodipine Besylate) .... Take One Tablet By Mouth Daily 4)  Lipitor 10 Mg Tabs (Atorvastatin Calcium) .... Take One Tablet  By Mouth Daily 5)  Niacor 500 Mg Tabs (Niacin) .... Take 1 Tablet By Mouth Once A Day 6)  Coreg 25 Mg Tabs (Carvedilol) .... Take One Tablet By Mouth Twice Daily 7)  Ambien 10 Mg Tabs (Zolpidem Tartrate) .... Take One Tablet By Mouth At Bedtime 8)  Neurontin 300 Mg Caps (Gabapentin) .... Take One Tablet By Mouth Three Times A Day 9)  Lasix 40 Mg Tabs (Furosemide) .... Take One Tablet By Mouth Daily 10)  Zyrtec 10 Mg Tabs (Cetirizine Hcl) .... Take One Tablet By Mouth Daily 11)  Venlafaxine Hcl 50 Mg Tabs (Venlafaxine Hcl) .... Take One Talbet By Mouth At Bedtime 12)  Amitriptyline Hcl 100 Mg Tabs (Amitriptyline Hcl) .... Take 1 Tablet By Mouth At Bedtime 13)  Metformin Hcl 1000 Mg  Tabs (Metformin Hcl) .Marland KitchenMarland KitchenMarland Kitchen  Take 1/2  Tablet By Mouth Two Times A Day 14)  Glipizide 10 Mg  Tb24 (Glipizide) .... Take 1 Tablet By Mouth Two Times A Day 15)  Ms Contin 60 Mg Xr12h-Tab (Morphine Sulfate) .... Take 1 Tablet By Mouth Two Times A Day 16)  Hydrocodone-Acetaminophen 10-325 Mg Tabs (Hydrocodone-Acetaminophen) .... Take 1 Tablet By Mouth Every 8 Hours As Needed For Pain  Allergies (verified): No Known Drug Allergies  Past History:  Past Medical History: Last updated: 10/19/2008 DM-II    - dx'd 07/2007: DKA (came in unresponsive)    - initially started on insulin, was switched to oral hypoglycemic agents 6/09 ASTHMA HYPERTENSION NON-ISCHEMIC CMP WITH CHF    - EF 20-25% (echo 11/2005) LBBB DYSLIPIDEMIA    - hyperTG on Niaspan    - dyslipidemia on Lipitor AOCD, baseline now 13.0 PERIPHERAL VASCULAR DISEASE    - s/p L AKA 26-Apr-2004 @ [facility]  (in the setting of a septic arthritis: MRSA)    - sees Dr. Riley Kill for pain mgmt  (painful stump) HEPATITIS C    - doc'd 12/2002 CHOLELITHIASIS - documented in 07/2007 on abdominal CT scan COCAINE USE    - s/p cardiopulmonary arrest 12/25/2002 [name])    - 2D echo: 20-25% LVEF, cardiac cath: 25-30% mid RCA stenosis, LVEF 40%, EPS study: LBBB but      otherwise normal Allergic rhinitis THROMBOCYTOPENIA    - 07/2007 while hospitalized. Went down to 90. Thought to be 2/2 Neurontin.  Past Surgical History: Last updated: 07/07/2008 s/p L AKA 2004-04-26  Family History: Last updated: 2007/10/16 Mother died of an MI in her 60s.  Father died from complications of cirrhosis. Brother had a fatal MI.  Social History: Tobacco: almost 1 PPD x 50 yrs.  Now smokes 5-7 cigarettes/day. Had 5 children, now 82. 103 y/o dgtr died 2008-04-26 from ESRD. His sister constantly checks in on him and comes with him to all his apptmts. Pt has an aide to help him during the day. Alcohol- occ. 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    Physical Exam  General:  alert, well-developed, well-nourished, and well-hydrated.   Head:  normocephalic and atraumatic.   Eyes:  vision grossly intact, pupils equal, and pupils round.   Mouth:  no gingival abnormalities and pharynx pink and moist.   Lungs:  normal respiratory effort, normal breath sounds, no crackles, and no wheezes.   Heart:  normal rate, regular rhythm, no gallop, no rub, and no JVD.  2/6 SEM Abdomen:  soft, non-tender, normal bowel sounds, no distention, no masses, no guarding, no rigidity, and no rebound tenderness.   Extremities:  Left AKA, trace edema in R  LE. Neurologic:  alert & oriented X3 and cranial nerves II-XII intact.     Impression & Recommendations:  Problem # 1:  DIABETES MELLITUS, TYPE II (ICD-250.00) Pt's A1c had previously been at goal but is no longer.  He denies any dietary indiscretions, he reports that he has been taking his meds as prescribed.  He will see his Ophthalmologist next week.  I have increased his Metformin dose which could still be increased further.  I hope this will be enough given his only mildly elevated A1c. His updated medication list for this problem  includes:    Enalapril Maleate 20 Mg Tabs (Enalapril maleate) .Marland Kitchen... Take one tablet by mouth daily    Metformin Hcl 850 Mg Tabs (Metformin hcl) .Marland Kitchen... Take 1 tablet by mouth two times a day    Glipizide 10 Mg Tb24 (Glipizide) .Marland Kitchen... Take 1 tablet by mouth two times a day  Orders: T- Capillary Blood Glucose ([id number]) T-Hgb A1C (in-house) ([id number])  Problem # 2:  LEG PAIN, CHRONIC, LEFT (ICD-729.5) Pt has chronic pain for which he takes MS COnitin and percocet.  He says these meds make him more functional.  I will write his script for his pain meds today to be filled no earlier that 09/17/09.  I will also check a UDS as per his pain contract. (Note: I had to print his pain med scripts twice bc first time was on plain paper, this should have no effect but I just wanted this to be clear in case of confusion)   Problem # 3:  HYPERTENSION (ICD-401.9) His BP is at goal.  I will make no changes. I will check a B-met. His updated medication list for this problem includes:    Enalapril Maleate 20 Mg Tabs (Enalapril maleate) .Marland Kitchen... Take one tablet by mouth daily    Norvasc 10 Mg Tabs (Amlodipine besylate) .Marland Kitchen... Take one tablet by mouth daily    Coreg 25 Mg Tabs (Carvedilol) .Marland Kitchen... Take one tablet by mouth twice daily    Lasix 40 Mg Tabs (Furosemide) .Marland Kitchen... Take one tablet by mouth daily  Orders: T-Basic Metabolic Panel [id number] [phone])  BP today: 133/72 Prior BP: 130/72  (04/26/2009)  Prior 10 Yr Risk Heart Disease: Not enough information (02/02/2007)  Labs Reviewed: K+: 3.8 (04/26/2009) Creat: : 1.09 (04/26/2009)   Chol: 115 (07/07/2008)   HDL: 31 (07/07/2008)   LDL: 52 (07/07/2008)   TG: 162 (07/07/2008)  Problem # 4:  DYSLIPIDEMIA (ICD-272.4) Pt's FLP had been at goal when last checked.  We elected not to recheck today but this will need to be followed up in the near future when he is fasting (especially in the context of worsening DM control). His updated medication list for this problem includes:    Lipitor 10 Mg Tabs (Atorvastatin calcium) .Marland Kitchen... Take one tablet  by mouth daily    Niacor 500 Mg Tabs (Niacin) .Marland Kitchen... Take 1 tablet by mouth once a day  Problem # 5:  CHF (ICD-428.0) Pt has been breathing well and had only minimal peripheral edema.  I will continue with his current medications. His updated medication list for this problem includes:    Plavix 75 Mg Tabs (Clopidogrel bisulfate) .Marland Kitchen... Take one tablet by mouth daily    Enalapril Maleate 20 Mg Tabs (Enalapril maleate) .Marland Kitchen... Take one tablet by mouth daily    Coreg 25 Mg Tabs (Carvedilol) .Marland Kitchen... Take one tablet by mouth twice daily    Lasix 40 Mg Tabs (Furosemide) .Marland Kitchen... Take one tablet by mouth daily  Complete Medication List: 1)  Plavix 75 Mg Tabs (Clopidogrel bisulfate) .... Take one tablet by mouth daily 2)  Enalapril Maleate 20 Mg Tabs (Enalapril maleate) .... Take one tablet by mouth daily 3)  Norvasc 10 Mg Tabs (Amlodipine besylate) .... Take one tablet by mouth daily 4)  Lipitor 10 Mg Tabs (Atorvastatin calcium) .... Take one tablet  by mouth daily 5)  Niacor 500 Mg Tabs (Niacin) .... Take 1 tablet by mouth once a day 6)  Coreg 25 Mg Tabs (Carvedilol) .... Take one tablet by mouth twice daily 7)  Ambien 10 Mg Tabs (Zolpidem tartrate) .... Take one tablet by mouth at bedtime 8)  Neurontin 300 Mg Caps (Gabapentin) .... Take one tablet by mouth three times a day 9)  Lasix 40 Mg Tabs  (Furosemide) .... Take one tablet by mouth daily 10)  Zyrtec 10 Mg Tabs (Cetirizine hcl) .... Take one tablet by mouth daily 11)  Venlafaxine Hcl 50 Mg Tabs (Venlafaxine hcl) .... Take one talbet by mouth at bedtime 12)  Amitriptyline Hcl 100 Mg Tabs (Amitriptyline hcl) .... Take 1 tablet by mouth at bedtime 13)  Metformin Hcl 850 Mg Tabs (Metformin hcl) .... Take 1 tablet by mouth two times a day 14)  Glipizide 10 Mg Tb24 (Glipizide) .... Take 1 tablet by mouth two times a day 15)  Ms Contin 60 Mg Xr12h-tab (Morphine sulfate) .... Take 1 tablet by mouth two times a day 16)  Hydrocodone-acetaminophen 10-325 Mg Tabs (Hydrocodone-acetaminophen) .... Take 1 tablet by mouth every 8 hours as needed for pain  Other Orders: Gastroenterology Referral (GI) T-Drug Screen-Urine, (single) [phone])  Patient Instructions: 1)  Please schedule a follow-up appointment in 3 months. 2)  Continue taking all of your other medicines as you have been.  3)  We will increase your metformin dose today a little bit to try to get your DM under control. 4)  You will have somes labs checked today. I will call you if there is anything in the results that needs to be addressed.  Prescriptions: HYDROCODONE-ACETAMINOPHEN 10-325 MG TABS (HYDROCODONE-ACETAMINOPHEN) Take 1 tablet by mouth every 8 hours as needed for pain  #90 x 0   Entered and Authorized by:   Brooks Sailors MD   Signed by:   Brooks Sailors MD on 09/07/2009   Method used:   Print then Give to Patient   RxID:   [id number] MS CONTIN 60 MG XR12H-TAB (MORPHINE SULFATE) Take 1 tablet by mouth two times a day  #60 x 0   Entered and Authorized by:   Brooks Sailors MD   Signed by:   Brooks Sailors MD on 09/07/2009   Method used:   Print then Give to Patient   RxID:   [id number] HYDROCODONE-ACETAMINOPHEN 10-325 MG TABS (HYDROCODONE-ACETAMINOPHEN) Take 1 tablet by mouth every 8 hours as needed for pain  #90 x 0   Entered and Authorized by:   Brooks Sailors MD    Signed by:   Brooks Sailors MD on 09/07/2009   Method used:   Print then Give to Patient   RxID:   [id number] MS CONTIN 60 MG XR12H-TAB (MORPHINE SULFATE) Take 1 tablet by mouth two times a day  #60 x 0   Entered and Authorized by:   Brooks Sailors MD   Signed by:   Brooks Sailors MD on 09/07/2009   Method used:   Print then Give to Patient   RxID:   [id number] METFORMIN HCL 850 MG TABS (METFORMIN HCL) Take 1 tablet by mouth two times a day  #60 x 3   Entered and Authorized by:   Brooks Sailors MD   Signed by:   Brooks Sailors MD on 09/07/2009   Method used:   Print then Give to Patient   RxID:   [id number] NEURONTIN 300 MG CAPS (GABAPENTIN) take one tablet by mouth three times a day  #90 x 3   Entered and Authorized by:   Brooks Sailors MD   Signed by:   Brooks Sailors MD on 09/07/2009   Method used:   Print then  Give to Patient   RxID:   [id number]   Prevention & Chronic Care Immunizations   Influenza vaccine: Fluvax MCR  (04/26/2009)    Tetanus booster: Not documented    Pneumococcal vaccine: Not documented    H. zoster vaccine: Not documented  Colorectal Screening   Hemoccult: Not documented   Hemoccult action/deferral: Ordered  (04/26/2009)    Colonoscopy: Not documented   Colonoscopy action/deferral: GI referral  (09/07/2009)  Other Screening   PSA: Not documented   Smoking status: current  (09/07/2009)   Smoking cessation counseling: yes  (09/07/2009)  Diabetes Mellitus   HgbA1C: 7.4  (09/07/2009)    Eye exam: Not documented    Foot exam: yes  (10/27/2007)   High risk foot: Not documented   Foot care education: Not documented    Urine microalbumin/creatinine ratio: 419.9  (04/26/2009)    Diabetes flowsheet reviewed?: Yes   Progress toward A1C goal: Deteriorated  Lipids   Total Cholesterol: 115  (07/07/2008)   Lipid panel action/deferral: Lipid Panel ordered   LDL: 52  (07/07/2008)   LDL Direct: Not documented   HDL: 31  (07/07/2008)    Triglycerides: 162  (07/07/2008)    SGOT (AST): 28  (07/07/2008)   SGPT (ALT): 42  (07/07/2008)   Alkaline phosphatase: 47  (07/07/2008)   Total bilirubin: 0.4  (07/07/2008)    Lipid flowsheet reviewed?: Yes   Progress toward LDL goal: Unchanged  Hypertension   Last Blood Pressure: 133 / 72  (09/07/2009)   Serum creatinine: 1.09  (04/26/2009)   BMP action: Ordered   Serum potassium 3.8  (04/26/2009)    Hypertension flowsheet reviewed?: Yes   Progress toward BP goal: At goal  Self-Management Support :    Patient will work on the following items until the next clinic visit to reach self-care goals:     Medications and monitoring: take my medicines every day, check my blood sugar, bring all of my medications to every visit, examine my feet every day  (09/07/2009)     Eating: drink diet soda or water instead of juice or soda, eat more vegetables, eat foods that are low in salt, eat baked foods instead of fried foods, eat fruit for snacks and desserts, limit or avoid alcohol  (09/07/2009)     Activity: take a 30 minute walk every day  (09/07/2009)    Diabetes self-management support: Written self-care plan, Education handout  (09/07/2009)   Diabetes care plan printed   Diabetes education handout printed   Last diabetes self-management training by diabetes educator: 08/26/2007   Last medical nutrition therapy: 08/26/2007    Hypertension self-management support: Written self-care plan, Education handout  (09/07/2009)   Hypertension self-care plan printed.   Hypertension education handout printed    Lipid self-management support: Written self-care plan, Education handout  (09/07/2009)   Lipid self-care plan printed.   Lipid education handout printed   Nursing Instructions: GI referral for screening colonoscopy (see order)   Process Orders Check Orders Results:     [facility]: Check successful Tests Sent for requisitioning (September 07, 2009 5:58 PM):      09/07/2009: [facility] -- T-Basic Metabolic Panel [[id number]] (signed)     09/07/2009: [facility] -- T-Drug Screen-Urine, (single) [[id number]] (signed)    Laboratory Results   Blood Tests   Date/Time Received: September 07, 2009 2:35 PM  Date/Time Reported: Burke Keels  September 07, 2009 2:35 PM   HGBA1C: 7.4%   (Normal Range: Non-Diabetic - 3-6%  Control Diabetic - 6-8%) CBG Random:: 81mg /dL     Appended Document: Orders Update    Clinical Lists Changes  Orders: Added new Service order of Est. Patient Level IV (16109) - Signed

## 2010-07-25 NOTE — Progress Notes (Signed)
Summary: Med clearance for dental extractions//kg  Phone Note From Other Clinic Call back at [phone]   Caller: Jody Call For: Dr Scot Dock Summary of Call: Received call from Jody with Dr Randa Evens dental office.  Pt needs to have 7 teeth extracted under general anesthesia here at [facility].  Office requesting a medical clearance (please refer to scanned request dated 01/08/10).  Please advise. Initial call taken by: Cynda Familia Duncan Dull),  March 12, 2010 2:27 PM  Follow-up for Phone Call        We may need to schedule an appointmnet for him for preop evaluation. Follow-up by: Lars Mage MD,  March 13, 2010 2:08 PM

## 2010-07-25 NOTE — Assessment & Plan Note (Signed)
Summary: EST-ROUTINE CHECKUP/CH   Vital Signs:  Patient profile:   66 year old male Height:      69.5 inches (176.53 cm) Weight:      213.3 pounds (96.95 kg) BMI:     31.16 Temp:     97.4 degrees F (36.33 degrees C) oral Pulse rate:   63 / minute BP sitting:   136 / 68  (right arm) Cuff size:   large  Vitals Entered By: Cynda Familia Duncan Dull) (March 04, 2010 2:44 PM) Is Patient Diabetic? Yes Did you bring your meter with you today? No Pain Assessment Patient in pain? no      Nutritional Status BMI of > 30 = obese CBG Result 165  Have you ever been in a relationship where you felt threatened, hurt or afraid?No   Does patient need assistance? Functional Status Cook/clean, Shopping, Social activities Ambulation Impaired:Risk for fall Comments walker/has home health aide   Diabetic Foot Exam Foot Inspection Is there a history of a foot ulcer?              No Is there a foot ulcer now?              No Can the patient see the bottom of their feet?          No Are the shoes appropriate in style and fit?          Yes Is there swelling or an abnormal foot shape?          No Are the toenails long?                No Are the toenails thick?                Yes Are the toenails ingrown?              No Is there heavy callous build-up?              No  Diabetic Foot Care Education Patient educated on appropriate care of diabetic feet.  Comments: Left AKA High Risk Feet? Yes   10-g (5.07) Semmes-Weinstein Monofilament Test Performed by: Lynn Ito          Right Foot          Left Foot Visual Inspection               Test Control                  Site 1         normal          Site 2         normal          Site 3         normal          Site 4         normal          Site 5         normal          Site 6         normal          Site 7         normal          Site 8         normal          Site 9         normal  Site 10         normal            Impression      normal            Primary Care Provider:  Lars Mage MD   History of Present Illness: Jonathon Galvan is a 65 yeal old man with PMH as described in EMR is here today for a regular follow up..  1. He says that morphine sulphate IR for breakthrough pain is not working well and his pain was well controlled with Percocet prescribed to him by his eye doctor 3-31months ago.  2. BP is well controlled on current meds.  3. Notes edema on his right feet which goes down after rest and build up during the day.  4. Still smoking about 8 cigs a day.   He denies any new sicknesses or hospitalizations, no chest pain episodes, no fevers, no chills, no abdominal or urinary concerns. No recent changes in appetite, weight.   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 Take One Tablet By Mouth Daily 2)  Enalapril Maleate 20 Mg Tabs (Enalapril Maleate) .... Take One Tablet By Mouth Daily 3)  Norvasc 10 Mg Tabs (Amlodipine Besylate) .... Take One Tablet By Mouth Daily 4)  Lipitor 10 Mg Tabs (Atorvastatin Calcium) .... Take One Tablet  By Mouth Daily 5)  Niacor 500 Mg Tabs (Niacin) ....  Take 1 Tablet By Mouth Once A Day 6)  Coreg 25 Mg Tabs (Carvedilol) .... Take One Tablet By Mouth Twice Daily 7)  Ambien 10 Mg Tabs (Zolpidem Tartrate) .... Take One Tablet By Mouth At Bedtime 8)  Neurontin 300 Mg Caps (Gabapentin) .... Take One Tablet By Mouth Three Times A Day 9)  Lasix 40 Mg Tabs (Furosemide) .... Take One Tablet By Mouth Three Times A Day 10)  Zyrtec 10 Mg Tabs (Cetirizine Hcl) .... Take One Tablet By Mouth Daily 11)  Venlafaxine Hcl 50 Mg Tabs (Venlafaxine Hcl) .... Take One Talbet By Mouth At Bedtime 12)  Amitriptyline Hcl 100 Mg Tabs (Amitriptyline Hcl) .... Take 1 Tablet By Mouth At Bedtime 13)  Metformin Hcl 850 Mg Tabs (Metformin Hcl) .... Take 1 Tablet By Mouth Two Times A Day 14)  Glipizide 10 Mg  Tb24 (Glipizide) .... Take 1 Tablet By Mouth Two Times A Day 15)  Ms Contin 60 Mg Xr12h-Tab (Morphine Sulfate) .... Take 1 Tablet By Mouth Two Times A Day 16)  Morphine Sulfate 15 Mg Tabs (Morphine Sulfate) .... Take One Pill By Mouth Three Times A Day As Needed For Pain  Current Medications (verified): 1)  Plavix 75 Mg Tabs (Clopidogrel Bisulfate) .Marland KitchenMarland KitchenMarland Kitchen  Take One Tablet By Mouth Daily 2)  Enalapril Maleate 20 Mg Tabs (Enalapril Maleate) .... Take One Tablet By Mouth Twice Daily. 3)  Norvasc 10 Mg Tabs (Amlodipine Besylate) .... Take One Tablet By Mouth Daily 4)  Lipitor 10 Mg Tabs (Atorvastatin Calcium) .... Take One Tablet  By Mouth Daily 5)  Niacor 500 Mg Tabs (Niacin) .... Take 1 Tablet By Mouth Once A Day 6)  Coreg 25 Mg Tabs (Carvedilol) .... Take One Tablet By Mouth Twice Daily 7)  Ambien 10 Mg Tabs (Zolpidem Tartrate) .... Take One Tablet By Mouth At Bedtime 8)  Neurontin 300 Mg Caps (Gabapentin) .... Take One Tablet By Mouth Three Times A Day 9)  Lasix 40 Mg Tabs (Furosemide) .... Take One Tablet By Mouth Three Times A Day 10)  Zyrtec 10 Mg Tabs (Cetirizine Hcl) .... Take One Tablet By Mouth Daily 11)  Venlafaxine Hcl 50 Mg Tabs (Venlafaxine Hcl) .... Take One  Talbet By Mouth At Bedtime 12)  Amitriptyline Hcl 100 Mg Tabs (Amitriptyline Hcl) .... Take 1 Tablet By Mouth At Bedtime 13)  Metformin Hcl 850 Mg Tabs (Metformin Hcl) .... Take 1 Tablet By Mouth Two Times A Day 14)  Glipizide 10 Mg  Tb24 (Glipizide) .... Take 1 Tablet By Mouth Two Times A Day 15)  Ms Contin 60 Mg Xr12h-Tab (Morphine Sulfate) .... Take 1 Tablet By Mouth Two Times A Day 16)  Morphine Sulfate 15 Mg Tabs (Morphine Sulfate) .... 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 Riley Kill for pain mgmt  (painful stump) HEPATITIS C    - doc'd 12/2002 CHOLELITHIASIS - documented in 07/2007 on abdominal CT scan COCAINE USE    - s/p cardiopulmonary arrest 12/25/2002 [name])    - 2D echo: 20-25% LVEF, cardiac cath: 25-30% mid RCA stenosis, LVEF 40%, EPS study: LBBB but      otherwise normal Allergic rhinitis THROMBOCYTOPENIA    - 07/2007 while hospitalized. Went down to 90. Thought to be 2/2 Neurontin.  Past Surgical History: Last updated: October 09, 2009 s/p L AKA 22-Apr-2004 Cyst removel on left shoulder  Family History: Last updated: Oct 09, 2009 Mother died of an MI in her 93s.  Father died from complications of cirrhosis. Brother had a fatal MI. No FH of Colon Cancer:  Social History: Last updated: 10-09-09 Tobacco: almost 1 PPD x 50 yrs.  Now smokes 5-7 cigarettes/day. Had 5 children,  now 4. 58 y/o dgtr died 04-22-2008 from ESRD. His sister constantly checks in on him and comes with him to all his apptmts. Pt has an aide to help him during the day. Alcohol- occ. 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 Daily Caffeine Use  occ  Risk Factors: Exercise: no (11/02/2009)  Risk Factors: Smoking Status: current (03/04/2010) Packs/Day: 0.5 (  03/04/2010)  Family History: Reviewed history from 09/20/2009 and no changes required. Mother died of an MI in her 75s.  Father died from complications of cirrhosis. Brother had a fatal MI. No FH of Colon Cancer:  Social History: Reviewed history from 09/20/2009 and no changes required. Tobacco: almost 1 PPD x 50 yrs.  Now smokes 5-7 cigarettes/day. Had 5 children, now 70. 31 y/o dgtr died 2008-04-21 from ESRD. His sister constantly checks in on him and comes with him to all his apptmts. Pt has an aide to help him during the day. Alcohol- occ. 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 Daily Caffeine Use  occ Packs/Day:  0.5  Review of Systems      See HPI  Physical Exam  Additional Exam:  Gen: AOx3, in no acute distress Eyes: PERRL, EOMI ENT:MMM, No erythema noted in posterior pharynx Neck: No JVD, No LAP Chest: CTAB with  good respiratory effort CVS: regular rhythmic rate, NO M/R/G, S1 S2 normal Abdo: soft,ND, BS+x4, Non tender and No hepatosplenomegaly EXT: 1+ pitting edema on right feet upto mid shin, left AKA Neuro: Non focal,  Skin: no rashes noted.   Diabetes Management Exam:    Foot Exam (with socks and/or shoes not present):       Sensory-Monofilament:          Right foot: normal   Impression & Recommendations:  Problem # 1:  LEG PAIN, CHRONIC, LEFT (ICD-729.5) Assessment Unchanged New pain contract signed. UDS obatined today. No change in pain pills at this time. Orders: T-Drug Screen-Urine, (single)  [phone])  Problem # 2:  DIABETES MELLITUS, TYPE II (ICD-250.00) Assessment: Unchanged Diabeteic counselling scheduled with Jamison Neighbor. Hba1c 7.5. No change in metformin as patient is already CHF and is at increased risk of Lactic acidosis. Eye exam due. Cmet today. Increase enalapril to 20 two times a day for maximal benefit for chf.  His updated medication list for this problem includes:    Enalapril Maleate 20 Mg Tabs (Enalapril maleate) .Marland Kitchen... Take one tablet by mouth twice daily.    Metformin Hcl 850 Mg Tabs (Metformin hcl) .Marland Kitchen... Take 1 tablet by mouth two times a day    Glipizide 10 Mg Tb24 (Glipizide) .Marland Kitchen... Take 1 tablet by mouth two times a day  Orders: T- Capillary Blood Glucose ([id number]) T-Hgb A1C (in-house) ([id number]) Diabetic Clinic Referral (Diabetic)  Labs Reviewed: Creat: 1.09 (09/07/2009)    Reviewed HgBA1c results: 7.5 (03/04/2010)  7.4 (09/07/2009)  Problem # 3:  THROMBOCYTOPENIA (ICD-287.5) Assessment: Comment Only will check cbc today. Orders: T-CBC w/Diff ([id number])  Problem # 4:  TOBACCO USE (ICD-305.1) Assessment: Comment Only Patient was counseled on smoking cessation strategies including medications and behavior modification options. not interested in counselling.  Problem # 5:  HEPATITIS C (ICD-070.51) Assessment: Comment Only will check cmet today.  Problem # 6:  CHF (ICD-428.0) Assessment: Unchanged increase enalapril to 20 two times a day , coreg already at max, stressed benefit of compliance with lasix and manage his lasix acc to his daily weights. low salt diet. His updated medication list for this problem includes:    Plavix 75 Mg Tabs (Clopidogrel bisulfate) .Marland Kitchen... Take one tablet by mouth daily    Enalapril Maleate 20 Mg Tabs (Enalapril maleate) .Marland Kitchen... Take one tablet by mouth twice daily.    Coreg 25 Mg Tabs (Carvedilol) .Marland Kitchen... Take one tablet by mouth twice daily    Lasix 40 Mg Tabs (Furosemide) .Marland Kitchen... Take one  tablet by mouth three  times a day  LVEF: 20 (12/02/2005)  Echocardiogram:   Study Conclusions    - Left ventricle: Systolic function was moderately to severely     reduced. The estimated ejection fraction was in the range of 30%     to 35%. Features are consistent with a pseudonormal left     ventricular filling pattern, with concomitant abnormal relaxation     and increased filling pressure (grade 2 diastolic dysfunction).     Doppler parameters are consistent with elevated mean left atrial     filling pressure.   - Ventricular septum: Septal motion showed severe dyssynergy and     paradox. These changes are consistent with a left bundle branch     block.   - Mitral valve: Calcified annulus. Mild regurgitation.   - Left atrium: The atrium was moderately dilated.   Transthoracic echocardiography. M-mode, complete 2D, spectral   Doppler, and color Doppler. Height: Height: 176.5cm. Height: 69.5in.   Weight: Weight: 84.5kg. Weight: 186lb. Body mass index: BMI:   27.1kg/m^2. Body surface area: BSA: 2.34m^2. Blood pressure: 130/90.   Patient status: Outpatient. Location: Echo laboratory.  (10/09/2009)  Problem # 7:  Preventive Health Care (ICD-V70.0) Assessment: Comment Only tetanus shot today. denied flu shot. Reviewed preventive care protocols, scheduled due services, and updated immunizations.  Complete Medication List: 1)  Plavix 75 Mg Tabs (Clopidogrel bisulfate) .... Take one tablet by mouth daily 2)  Enalapril Maleate 20 Mg Tabs (Enalapril maleate) .... Take one tablet by mouth twice daily. 3)  Norvasc 10 Mg Tabs (Amlodipine besylate) .... Take one tablet by mouth daily 4)  Lipitor 10 Mg Tabs (Atorvastatin calcium) .... Take one tablet  by mouth daily 5)  Niacor 500 Mg Tabs (Niacin) .... Take 1 tablet by mouth once a day 6)  Coreg 25 Mg Tabs (Carvedilol) .... Take one tablet by mouth twice daily 7)  Ambien 10 Mg Tabs (Zolpidem tartrate) .... Take one tablet by mouth at bedtime 8)  Neurontin 300  Mg Caps (Gabapentin) .... Take one tablet by mouth three times a day 9)  Lasix 40 Mg Tabs (Furosemide) .... Take one tablet by mouth three times a day 10)  Zyrtec 10 Mg Tabs (Cetirizine hcl) .... Take one tablet by mouth daily 11)  Venlafaxine Hcl 50 Mg Tabs (Venlafaxine hcl) .... Take one talbet by mouth at bedtime 12)  Amitriptyline Hcl 100 Mg Tabs (Amitriptyline hcl) .... Take 1 tablet by mouth at bedtime 13)  Metformin Hcl 850 Mg Tabs (Metformin hcl) .... Take 1 tablet by mouth two times a day 14)  Glipizide 10 Mg Tb24 (Glipizide) .... Take 1 tablet by mouth two times a day 15)  Ms Contin 60 Mg Xr12h-tab (Morphine sulfate) .... Take 1 tablet by mouth two times a day 16)  Morphine Sulfate 15 Mg Tabs (Morphine sulfate) .... Take one pill by mouth three times a day as needed for pain  Other Orders: T-CMP with Estimated GFR ([id number])  Patient Instructions: 1)  Please schedule a follow-up appointment in 6 months. 2)  Please schedule a follow-up appointment as needed. 3)  Tobacco is very bad for your health and your loved ones! You Should stop smoking!. 4)  Stop Smoking Tips: Choose a Quit date. Cut down before the Quit date. decide what you will do as a substitute when you feel the urge to smoke(gum,toothpick,exercise). 5)  It is important that your Diabetic A1c level is checked every 3 months. 6)  See your  eye doctor yearly to check for diabetic eye damage. 7)  Check your Blood Pressure regularly. If it is above: 160/100 you should make an appointment. Prescriptions: ENALAPRIL MALEATE 20 MG TABS (ENALAPRIL MALEATE) take one tablet by mouth twice daily.  #60 x 11   Entered and Authorized by:   Lars Mage MD   Signed by:   Lars Mage MD on 03/04/2010   Method used:   Faxed to ...       [facility] (retail)       [address]       [place], [place]  [address]       Ph: [phone]       Fax: [phone]   RxID:   [id number]  Process Orders Check  Orders Results:     [facility]: Check successful Tests Sent for requisitioning (March 04, 2010 9:33 PM):     03/04/2010: [facility] -- T-CMP with Estimated GFR [[id number]] (signed)     03/04/2010: [facility] -- T-CBC w/Diff [[id number]] (signed)     03/04/2010: [facility] -- T-Drug Screen-Urine, (single) [phone] (signed)     Prevention & Chronic Care Immunizations   Influenza vaccine: Fluvax MCR  (04/26/2009)   Influenza vaccine deferral: Deferred  (10/02/2009)    Tetanus booster: Not documented   Td booster deferral: Not indicated  (10/09/2009)    Pneumococcal vaccine: Pneumovax  (10/02/2009)    H. zoster vaccine: Not documented   H. zoster vaccine deferral: Not indicated  (10/09/2009)  Colorectal Screening   Hemoccult: Not documented   Hemoccult action/deferral: Refused  (10/09/2009)    Colonoscopy: DONE  (10/23/2009)   Colonoscopy action/deferral: Refused  (10/09/2009)   Colonoscopy due: 01/2010  Other Screening   PSA: 0.10  (10/02/2009)   PSA action/deferral: Discussed-decision deferred  (10/02/2009)   Smoking status: current  (03/04/2010)   Smoking cessation counseling: yes  (03/04/2010)  Diabetes Mellitus   HgbA1C: 7.5  (03/04/2010)    Eye exam: Not documented   Diabetic eye exam action/deferral: Deferred  (10/09/2009)    Foot exam: yes  (03/04/2010)   Foot exam action/deferral: Do today   High risk foot: Yes  (03/04/2010)   Foot care education: Done  (03/04/2010)    Urine microalbumin/creatinine ratio: 419.9  (04/26/2009)    Diabetes flowsheet reviewed?: Yes   Progress toward A1C goal: At goal  Lipids   Total Cholesterol: 115  (07/07/2008)   Lipid panel action/deferral: Lipid Panel ordered   LDL: 52  (07/07/2008)   LDL Direct: Not documented   HDL: 31  (07/07/2008)   Triglycerides: 162  (07/07/2008)    SGOT (AST): 33  (10/02/2009)   SGPT (ALT): 29  (10/02/2009)    Alkaline phosphatase: 65  (10/02/2009)   Total bilirubin: 0.4  (10/02/2009)    Lipid flowsheet reviewed?: Yes   Progress toward LDL goal: At goal  Hypertension   Last Blood Pressure: 136 / 68  (03/04/2010)   Serum creatinine: 1.09  (09/07/2009)   BMP action: Ordered   Serum potassium 3.7  (09/07/2009)    Hypertension flowsheet reviewed?: Yes   Progress toward BP goal: Unchanged  Self-Management Support :   Personal Goals (by the next clinic visit) :     Personal A1C goal: 7  (10/02/2009)     Personal blood pressure goal: 130/80  (10/02/2009)     Personal LDL goal: 100  (10/02/2009)    Patient will work on  the following items until the next clinic visit to reach self-care goals:     Medications and monitoring: take my medicines every day, check my blood sugar  (03/04/2010)     Eating: drink diet soda or water instead of juice or soda, eat foods that are low in salt, eat baked foods instead of fried foods  (03/04/2010)     Activity: take a 30 minute walk every day  (11/02/2009)    Diabetes self-management support: Resources for patients handout, Written self-care plan, Referred for DM self-management training  (03/04/2010)   Diabetes care plan printed   Last diabetes self-management training by diabetes educator: 08/26/2007   Referred for diabetes self-mgmt training.   Last medical nutrition therapy: 08/26/2007    Hypertension self-management support: Resources for patients handout, Written self-care plan  (03/04/2010)   Hypertension self-care plan printed.    Lipid self-management support: Resources for patients handout, Written self-care plan  (03/04/2010)   Lipid self-care plan printed.      Resource handout printed.   Nursing Instructions: Refer for diabetes self-management training (see order) Diabetic foot exam today    Diabetes Self Management Training Referral Patient Name: Jonathon Galvan Date Of Birth: 1946-03-27 MRN: [id number] Current Diagnosis:  LEG PAIN,  CHRONIC, LEFT (ICD-729.5) SPECIAL SCREENING MALIGNANT NEOPLASM OF PROSTATE (ICD-V76.44) LEG PAIN, CHRONIC, LEFT (ICD-729.5) LBBB (ICD-426.3) OTHER PRIMARY CARDIOMYOPATHIES (ICD-425.4) DIABETES MELLITUS, TYPE II (ICD-250.00) THROMBOCYTOPENIA (ICD-287.5) PVD (ICD-443.9) TOBACCO USE (ICD-305.1) ALLERGIC RHINITIS (ICD-477.9) COCAINE ABUSE (ICD-305.60) COMPLICATION, AMPUTATION STUMP NOS (ICD-997.60) HEPATITIS C (ICD-070.51) ANEMIA (ICD-285.9) DYSLIPIDEMIA (ICD-272.4) HYPERTENSION (ICD-401.9) CHF (ICD-428.0) ASTHMA (ICD-493.90)     Management Training Needs:   Follow-up DSMT(2 hours/year)  Complicating Conditions:  HTN  Dyslipidemia  PVD  Barriers:  Impaired vision   Laboratory Results   Blood Tests   Date/Time Received: March 04, 2010 2:58 PM Date/Time Reported: Alric Quan  March 04, 2010 2:58 PM   HGBA1C: 7.5%   (Normal Range: Non-Diabetic - 3-6%   Control Diabetic - 6-8%) CBG Random:: 165mg /dL     Process Orders Check Orders Results:     [facility]: Check successful Tests Sent for requisitioning (March 04, 2010 9:33 PM):     03/04/2010: [facility] -- T-CMP with Estimated GFR [[id number]] (signed)     03/04/2010: [facility] -- T-CBC w/Diff [[id number]] (signed)     03/04/2010: [facility] -- T-Drug Screen-Urine, (single) [[id number]] (signed)

## 2010-07-25 NOTE — Progress Notes (Signed)
Summary: refill/gg  Phone Note Refill Request  on February 15, 2010 10:40 AM  Refills Requested: Medication #1:  MS CONTIN 60 MG XR12H-TAB Take 1 tablet by mouth two times a day   Last Refilled: 01/16/2010  Medication #2:  MORPHINE SULFATE 15 MG TABS Take one pill by mouth three times a day as needed for pain.   Last Refilled: 01/16/2010 Pt # [id number]   Method Requested: Pick up at Office Initial call taken by: Merrie Roof RN,  February 15, 2010 10:41 AM  Follow-up for Phone Call        Prescription put in the Rx box. Follow-up by: Lars Mage MD,  February 15, 2010 1:20 PM  Additional Follow-up for Phone Call Additional follow up Details #1::        Pt informed Rx is ready for pickup Additional Follow-up by: Merrie Roof RN,  February 15, 2010 2:13 PM    Prescriptions: MORPHINE SULFATE 15 MG TABS (MORPHINE SULFATE) Take one pill by mouth three times a day as needed for pain  #90 x 0   Entered and Authorized by:   Lars Mage MD   Signed by:   Lars Mage MD on 02/15/2010   Method used:   Handwritten   RxID:   [id number] MS CONTIN 60 MG XR12H-TAB (MORPHINE SULFATE) Take 1 tablet by mouth two times a day  #60 x 0   Entered and Authorized by:   Lars Mage MD   Signed by:   Lars Mage MD on 02/15/2010   Method used:   Handwritten   RxID:   [id number]

## 2010-07-25 NOTE — Consult Note (Signed)
Summary: [facility]   Imported By: Margie Billet 12/25/2009 14:11:24  _____________________________________________________________________  External Attachment:    Type:   Image     Comment:   External Document

## 2010-07-25 NOTE — Medication Information (Signed)
Summary: MORPHINE SULFATE/MS,CONTIN  MORPHINE SULFATE/MS,CONTIN   Imported By: Margie Billet 03/19/2010 11:36:14  _____________________________________________________________________  External Attachment:    Type:   Image     Comment:   External Document

## 2010-07-25 NOTE — Progress Notes (Signed)
Summary: Refill/gh  Phone Note Refill Request Message from:  Patient on August 20, 2009 2:41 PM  Refills Requested: Medication #1:  MS CONTIN 60 MG XR12H-TAB Take 1 tablet by mouth two times a day   Last Refilled: 07/20/2009  Medication #2:  HYDROCODONE-ACETAMINOPHEN 10-325 MG TABS Take 1 tablet by mouth every 8 hours as needed for pain.   Last Refilled: 07/20/2009  Method Requested: Electronic Initial call taken by: Angelina Ok RN,  August 20, 2009 2:42 PM  Follow-up for Phone Call        Will refill but must schedule appointment and be seen for next one. Follow-up by: Julaine Fusi  DO,  August 20, 2009 3:08 PM  Additional Follow-up for Phone Call Additional follow up Details #1::        Rx given to patient.Pt was scheduled for an appointment. Additional Follow-up by: Angelina Ok RN,  August 21, 2009 11:06 AM    Prescriptions: HYDROCODONE-ACETAMINOPHEN 10-325 MG TABS (HYDROCODONE-ACETAMINOPHEN) Take 1 tablet by mouth every 8 hours as needed for pain  #90 x 0   Entered and Authorized by:   Julaine Fusi  DO   Signed by:   Julaine Fusi  DO on 08/20/2009   Method used:   Print then Give to Patient   RxID:   [id number] MS CONTIN 60 MG XR12H-TAB (MORPHINE SULFATE) Take 1 tablet by mouth two times a day  #60 x 0   Entered and Authorized by:   Julaine Fusi  DO   Signed by:   Julaine Fusi  DO on 08/20/2009   Method used:   Print then Give to Patient   RxID:   [id number]

## 2010-07-25 NOTE — Medication Information (Signed)
Summary: MS CONTIN/MORPHINE  MS CONTIN/MORPHINE   Imported By: Margie Billet 03/05/2010 11:45:28  _____________________________________________________________________  External Attachment:    Type:   Image     Comment:   External Document

## 2010-07-25 NOTE — Progress Notes (Signed)
Summary: refill/gg  Phone Note Refill Request  on Nov 14, 2009 11:31 AM  Refills Requested: Medication #1:  MS CONTIN 60 MG XR12H-TAB Take 1 tablet by mouth two times a day   Last Refilled: 10/19/2009  Medication #2:  MORPHINE SULFATE 15 MG TABS Take one pill by mouth three times a day as needed for pain.   Last Refilled: 10/19/2009 Call when ready   Method Requested: Pick up at Office Initial call taken by: Merrie Roof RN,  Nov 14, 2009 11:31 AM  Follow-up for Phone Call        Rx written Follow-up by: Acey Lav MD,  Nov 16, 2009 11:22 AM    Prescriptions: MORPHINE SULFATE 15 MG TABS (MORPHINE SULFATE) Take one pill by mouth three times a day as needed for pain  #90 x 0   Entered and Authorized by:   Acey Lav MD   Signed by:   Paulette Blanch Dam MD on 11/16/2009   Method used:   Print then Give to Patient   RxID:   [id number] MS CONTIN 60 MG XR12H-TAB (MORPHINE SULFATE) Take 1 tablet by mouth two times a day  #60 x 0   Entered and Authorized by:   Acey Lav MD   Signed by:   Paulette Blanch Dam MD on 11/16/2009   Method used:   Print then Give to Patient   RxID:   [id number]   Appended Document: refill/gg Rxs. ready for pick-up; pt. called.

## 2010-07-25 NOTE — Progress Notes (Signed)
Summary: Refill/gh  Phone Note Refill Request Message from:  Patient on June 18, 2010 9:56 AM  Refills Requested: Medication #1:  MS CONTIN 60 MG XR12H-TAB Take 1 tablet by mouth two times a day  Medication #2:  MORPHINE SULFATE 15 MG TABS Take one pill by mouth three times a day as needed for pain  Method Requested: Pick up at Office Initial call taken by: Angelina Ok RN,  June 18, 2010 9:56 AM  Follow-up for Phone Call        Rx written and kept in the Rx room. Follow-up by: Lars Mage MD,  June 18, 2010 10:41 AM    Prescriptions: MORPHINE SULFATE 15 MG TABS (MORPHINE SULFATE) Take one pill by mouth three times a day as needed for pain  #90 x 0   Entered and Authorized by:   Lars Mage MD   Signed by:   Lars Mage MD on 06/18/2010   Method used:   Handwritten   RxID:   [id number] MS CONTIN 60 MG XR12H-TAB (MORPHINE SULFATE) Take 1 tablet by mouth two times a day  #60 x 0   Entered and Authorized by:   Lars Mage MD   Signed by:   Lars Mage MD on 06/18/2010   Method used:   Handwritten   RxID:   [id number]

## 2010-07-25 NOTE — Miscellaneous (Signed)
Summary: Moviprep rx  Clinical Lists Changes  Medications: Added new medication of MOVIPREP 100 GM  SOLR (PEG-KCL-NACL-NASULF-NA ASC-C) As per prep instructions. - Signed Rx of MOVIPREP 100 GM  SOLR (PEG-KCL-NACL-NASULF-NA ASC-C) As per prep instructions.;  #1 x 0;  Signed;  Entered by: Karl Bales RN;  Authorized by: Iva Boop MD, FACG;  Method used: Print then Give to Patient    Prescriptions: MOVIPREP 100 GM  SOLR (PEG-KCL-NACL-NASULF-NA ASC-C) As per prep instructions.  #1 x 0   Entered by:   Karl Bales RN   Authorized by:   Iva Boop MD, [facility]   Signed by:   Karl Bales RN on 10/10/2009   Method used:   Print then Give to Patient   RxID:   [id number]   Appended Document: Moviprep rx Called rx into [facility] at [facility]

## 2010-07-25 NOTE — Progress Notes (Signed)
Summary: med refill/gp  Phone Note Refill Request Message from:  Patient on December 17, 2009 11:10 AM  Refills Requested: Medication #1:  MS CONTIN 60 MG XR12H-TAB Take 1 tablet by mouth two times a day   Dosage confirmed as above?Dosage Confirmed   Brand Name Necessary? No   Supply Requested: 1 month   Last Refilled: 11/16/2009  Medication #2:  MORPHINE SULFATE 15 MG TABS Take one pill by mouth three times a day as needed for pain.   Dosage confirmed as above?Dosage Confirmed   Brand Name Necessary? No   Supply Requested: 1 month   Last Refilled: 11/16/2009  Method Requested: Pick up at Office Initial call taken by: Chinita Pester RN,  December 17, 2009 11:10 AM  Follow-up for Phone Call        Rx written, i will drop the prescription in office tomorrow afternoon. Follow-up by: Lars Mage MD,  December 18, 2009 12:28 PM  Additional Follow-up for Phone Call Additional follow up Details #1::        Rx given to patient Additional Follow-up by: Merrie Roof RN,  December 19, 2009 1:22 PM

## 2010-07-25 NOTE — Progress Notes (Signed)
Summary: refill/gg   friday  Phone Note Refill Request  on July 17, 2009 11:03 AM  Refills Requested: Medication #1:  MS CONTIN 60 MG XR12H-TAB Take 1 tablet by mouth two times a day   Last Refilled: 06/19/2009  Medication #2:  HYDROCODONE-ACETAMINOPHEN 10-325 MG TABS Take 1 tablet by mouth every 8 hours as needed for pain.   Last Refilled: 06/19/2009 #[id number]   Method Requested: Pick up at Office Initial call taken by: Merrie Roof RN,  July 17, 2009 11:03 AM  Follow-up for Phone Call        Reviewed chart and indications for narcotics.  Ran name through narcotic database. Follow-up by: Blanch Media MD,  July 20, 2009 10:46 AM    Prescriptions: HYDROCODONE-ACETAMINOPHEN 10-325 MG TABS (HYDROCODONE-ACETAMINOPHEN) Take 1 tablet by mouth every 8 hours as needed for pain  #90 x 0   Entered and Authorized by:   Blanch Media MD   Signed by:   Blanch Media MD on 07/20/2009   Method used:   Print then Give to Patient   RxID:   [id number] MS CONTIN 60 MG XR12H-TAB (MORPHINE SULFATE) Take 1 tablet by mouth two times a day  #60 x 0   Entered and Authorized by:   Blanch Media MD   Signed by:   Blanch Media MD on 07/20/2009   Method used:   Print then Give to Patient   RxID:   [id number]

## 2010-07-25 NOTE — Progress Notes (Signed)
Summary: med refill/gp  Phone Note Refill Request Message from:  Patient on January 16, 2010 11:34 AM  Refills Requested: Medication #1:  MS CONTIN 60 MG XR12H-TAB Take 1 tablet by mouth two times a day   Dosage confirmed as above?Dosage Confirmed   Brand Name Necessary? No   Supply Requested: 1 month  Medication #2:  MORPHINE SULFATE 15 MG TABS Take one pill by mouth three times a day as needed for pain.   Dosage confirmed as above?Dosage Confirmed   Brand Name Necessary? No   Supply Requested: 1 month pt. will like to pick up Rxs by 7/29.   Method Requested: Pick up at Office Initial call taken by: Chinita Pester RN,  January 16, 2010 11:34 AM  Follow-up for Phone Call        Scripts in the Box ready for pick up.  Thank you. Follow-up by: Lars Mage MD,  January 16, 2010 12:31 PM  Additional Follow-up for Phone Call Additional follow up Details #1::        Rx ready to pick up; pt. was called. Additional Follow-up by: Chinita Pester RN,  January 16, 2010 2:56 PM    Prescriptions: MORPHINE SULFATE 15 MG TABS (MORPHINE SULFATE) Take one pill by mouth three times a day as needed for pain  #90 x 0   Entered and Authorized by:   Lars Mage MD   Signed by:   Lars Mage MD on 01/16/2010   Method used:   Handwritten   RxID:   [id number] MS CONTIN 60 MG XR12H-TAB (MORPHINE SULFATE) Take 1 tablet by mouth two times a day  #60 x 0   Entered and Authorized by:   Lars Mage MD   Signed by:   Lars Mage MD on 01/16/2010   Method used:   Handwritten   RxID:   [id number]

## 2010-07-25 NOTE — Medication Information (Signed)
Summary: MS CONTIN /MORPHINE  MS CONTIN /MORPHINE   Imported By: Margie Billet 12/21/2009 11:04:45  _____________________________________________________________________  External Attachment:    Type:   Image     Comment:   External Document

## 2010-07-25 NOTE — Medication Information (Signed)
Summary: Tax adviser   Imported By: Louretta Parma 01/23/2010 15:10:24  _____________________________________________________________________  External Attachment:    Type:   Image     Comment:   External Document

## 2010-07-25 NOTE — Progress Notes (Signed)
Summary: REfill/gh  Phone Note Refill Request Message from:  Fax from Pharmacy on February 28, 2010 12:11 PM  Refills Requested: Medication #1:  METFORMIN HCL 850 MG TABS Take 1 tablet by mouth two times a day   Dosage confirmed as above?Dosage Confirmed   Brand Name Necessary? No   Supply Requested: 1 year  Medication #2:  AMBIEN 10 MG TABS take one tablet by mouth at bedtime   Dosage confirmed as above?Dosage Confirmed   Brand Name Necessary? No   Supply Requested: 1 month  Method Requested: Electronic Initial call taken by: Angelina Ok RN,  February 28, 2010 12:12 PM  Follow-up for Phone Call        Refill approved-nurse to complete Follow-up by: Lars Mage MD,  March 01, 2010 7:38 AM  Additional Follow-up for Phone Call Additional follow up Details #1::        Rx faxed to pharmacy Additional Follow-up by: Merrie Roof RN,  March 01, 2010 5:09 PM    Prescriptions: METFORMIN HCL 850 MG TABS (METFORMIN HCL) Take 1 tablet by mouth two times a day  #60 x 3   Entered and Authorized by:   Lars Mage MD   Signed by:   Lars Mage MD on 03/01/2010   Method used:   Telephoned to ...       [facility] (retail)       [address]       [place], [place]  [address]  [place]       Ph: [phone]       Fax: [phone]   RxID:   [id number] AMBIEN 10 MG TABS (ZOLPIDEM TARTRATE) take one tablet by mouth at bedtime  #30 x 3   Entered and Authorized by:   Lars Mage MD   Signed by:   Lars Mage MD on 03/01/2010   Method used:   Telephoned to ...       [facility] (retail)       [address]       [place], [place]  [address]  [place]       Ph: [phone]       Fax: [phone]   RxID:   [id number]

## 2010-07-25 NOTE — Progress Notes (Signed)
Summary: Home Health  Phone Note From Other Clinic   Caller: Nurse Call For: Dr. Theotis Barrio Summary of Call: Care Jonathon Galvan seeing pt.  Said that pt has an open wound on top of right toe.  Need Diabetic training. Need an order to go out to assess and treat.  Will also do Diabetic training for the pt. Angelina Ok RN  September 28, 2009 4:12 PM  Initial call taken by: Angelina Ok RN,  September 28, 2009 4:12 PM  Follow-up for Phone Call        Spoke with Dr. Aundria Rud.  Order siged to assess wound on foot and treat and for Diabetic teaching. Follow-up by: Angelina Ok RN,  September 28, 2009 4:43 PM

## 2010-07-25 NOTE — Progress Notes (Signed)
Summary: refill/gg  Phone Note Refill Request  on March 22, 2010 3:59 PM  Refills Requested: Medication #1:  NIACOR 500 MG TABS Take 1 tablet by mouth once a day   Dosage confirmed as above?Dosage Confirmed   Brand Name Necessary? No   Supply Requested: 1 year Refill is for niaspan 500 mg    Method Requested: Fax to Local Pharmacy Initial call taken by: Merrie Roof RN,  March 22, 2010 4:00 PM  Follow-up for Phone Call        Refill approved-nurse to complete Follow-up by: Lars Mage MD,  March 25, 2010 12:03 PM  Additional Follow-up for Phone Call Additional follow up Details #1::        Rx faxed to pharmacy Additional Follow-up by: Merrie Roof RN,  March 25, 2010 3:58 PM    Prescriptions: NIACOR 500 MG TABS (NIACIN) Take 1 tablet by mouth once a day  #30 x 11   Entered and Authorized by:   Lars Mage MD   Signed by:   Lars Mage MD on 03/25/2010   Method used:   Telephoned to ...       [facility] (mail-order)       [address]       [place], [place]  [address]       Ph: [phone]       Fax: [phone]   RxID:   [id number]

## 2010-07-25 NOTE — Progress Notes (Signed)
Summary: phone/gg  Phone Note From Other Clinic   Summary of Call: call from Thuy,  student nurse working with pt. calls today stating pt cbg 396 at 1020.  last ate 0800,  he did have metformin today.  9/1 cbg was 204,  9/8 cbg 166, and  9/27 cbg 219   Also pt as a lesion on rt lower leg about 2 inches, with drainage.  noted a week ago.  will see today. Initial call taken by: Merrie Roof RN,  April 11, 2010 10:29 AM  Follow-up for Phone Call        will see him today thanks iskra Follow-up by: Mliss Sax MD,  April 11, 2010 10:56 AM

## 2010-07-25 NOTE — Progress Notes (Signed)
Summary: refill/ hla  Phone Note Refill Request Message from:  Fax from Pharmacy on December 05, 2009 4:21 PM  Refills Requested: Medication #1:  LASIX 40 MG TABS take one tablet by mouth three times a day   Dosage confirmed as above?Dosage Confirmed   Brand Name Necessary? No   Supply Requested: 1 year last visit 10/2009 and labs 08/2008  Initial call taken by: Marin Roberts RN,  December 05, 2009 4:21 PM  Follow-up for Phone Call        Refill approved-nurse to complete Follow-up by: Lars Mage MD,  December 06, 2009 3:12 PM    Prescriptions: LASIX 40 MG TABS (FUROSEMIDE) take one tablet by mouth three times a day  #120 x 11   Entered and Authorized by:   Lars Mage MD   Signed by:   Lars Mage MD on 12/06/2009   Method used:   Telephoned to ...       [facility] (mail-order)       [address]       [place], [place]  [address]       Ph: [phone]       Fax: [phone]   RxID:   [id number]

## 2010-07-25 NOTE — Progress Notes (Signed)
Summary: refill/gg  Phone Note Refill Request  on March 18, 2010 8:36 AM  Refills Requested: Medication #1:  MS CONTIN 60 MG XR12H-TAB Take 1 tablet by mouth two times a day   Last Refilled: 02/15/2010  Medication #2:  MORPHINE SULFATE 15 MG TABS Take one pill by mouth three times a day as needed for pain.   Last Refilled: 02/15/2010 Call when ready  [phone]   Method Requested: Pick up at Office Initial call taken by: Merrie Roof RN,  March 18, 2010 8:37 AM  Follow-up for Phone Call        Rx written, will drop it off during my afternoon clinic at 1.30 Follow-up by: Lars Mage MD,  March 18, 2010 8:45 AM  Additional Follow-up for Phone Call Additional follow up Details #1::        Pt informed Additional Follow-up by: Merrie Roof RN,  March 18, 2010 11:47 AM    Prescriptions: MORPHINE SULFATE 15 MG TABS (MORPHINE SULFATE) Take one pill by mouth three times a day as needed for pain  #90 x 0   Entered and Authorized by:   Lars Mage MD   Signed by:   Lars Mage MD on 03/18/2010   Method used:   Handwritten   RxID:   [id number] MS CONTIN 60 MG XR12H-TAB (MORPHINE SULFATE) Take 1 tablet by mouth two times a day  #60 x 0   Entered and Authorized by:   Lars Mage MD   Signed by:   Lars Mage MD on 03/18/2010   Method used:   Handwritten   RxID:   [id number]

## 2010-07-25 NOTE — Assessment & Plan Note (Signed)
Summary: 2 WEEK F/U VISIT/S[place]/[facility]   Vital Signs:  Patient profile:   65 year old male Height:      69.5 inches (176.53 cm) Weight:      216.2 pounds (97.82 kg) BMI:     31.44 Temp:     97.0 degrees F (36.11 degrees C) oral Pulse rate:   62 / minute BP sitting:   130 / 69  (left arm) Cuff size:   regular  Vitals Entered By: Theotis Barrio NT II (Nov 02, 2009 3:12 PM) CC: 2 WEEK FOLLOW UP APPT.  Is Patient Diabetic? Yes Did you bring your meter with you today? No Pain Assessment Patient in pain? no      Nutritional Status BMI of > 30 = obese  Have you ever been in a relationship where you felt threatened, hurt or afraid?No   Does patient need assistance? Functional Status Self care Ambulation Impaired:Risk for fall   Primary Care Provider:  Brooks Sailors MD  CC:  2 WEEK FOLLOW UP APPT. Jonathon Galvan  History of Present Illness: 65 y/o M with PMH outlined in the EMR who presents today for a follow up of his appointment. He reports feeling good and denies any other problems. His US-abdomen, Colonoscopy repors reviewed.     Preventive Screening-Counseling & Management  Alcohol-Tobacco     Smoking Status: current     Smoking Cessation Counseling: yes     Packs/Day: 5 cig per day  Caffeine-Diet-Exercise     Does Patient Exercise: no  Problems Prior to Update: 1)  Special Screening Malignant Neoplasm of Prostate  (ICD-V76.44) 2)  Leg Pain, Chronic, Left  (ICD-729.5) 3)  Lbbb  (ICD-426.3) 4)  Other Primary Cardiomyopathies  (ICD-425.4) 5)  Diabetes Mellitus, Type II  (ICD-250.00) 6)  Thrombocytopenia  (ICD-287.5) 7)  Pvd  (ICD-443.9) 8)  Tobacco Use  (ICD-305.1) 9)  Allergic Rhinitis  (ICD-477.9) 10)  Cocaine Abuse  (ICD-305.60) 11)  Complication, Amputation Stump Nos  (ICD-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 Take One Tablet By Mouth Daily 2)  Enalapril Maleate 20 Mg Tabs (Enalapril Maleate) .... Take One Tablet By Mouth Daily 3)  Norvasc 10 Mg Tabs (Amlodipine Besylate) .... Take One Tablet By Mouth Daily 4)  Lipitor 10 Mg Tabs (Atorvastatin Calcium) .... Take One Tablet  By Mouth Daily 5)  Niacor 500 Mg Tabs (Niacin) .... Take 1 Tablet By Mouth Once A Day 6)  Coreg 25 Mg Tabs (Carvedilol) .... Take One Tablet By Mouth Twice Daily 7)  Ambien 10 Mg Tabs (Zolpidem Tartrate) .... Take One Tablet By Mouth At Bedtime 8)  Neurontin 300 Mg Caps (Gabapentin) .... Take One Tablet By Mouth Three Times A Day 9)  Lasix 40 Mg Tabs (Furosemide) .... Take One Tablet By Mouth Three Times A Day 10)  Zyrtec 10 Mg Tabs (Cetirizine Hcl) .... Take One Tablet By Mouth Daily 11)  Venlafaxine Hcl 50 Mg Tabs (Venlafaxine Hcl) .... Take One Talbet By Mouth At Bedtime 12)  Amitriptyline Hcl 100 Mg Tabs (Amitriptyline Hcl) .... Take 1 Tablet By Mouth At Bedtime 13)  Metformin Hcl 850 Mg Tabs (Metformin Hcl) .... Take 1 Tablet By Mouth Two Times A Day 14)  Glipizide 10 Mg  Tb24 (Glipizide) .... Take 1 Tablet By  Mouth Two Times A Day 15)  Ms Contin 60 Mg Xr12h-Tab (Morphine Sulfate) .... Take 1 Tablet By Mouth Two Times A Day 16)  Moviprep 100 Gm  Solr (Peg-Kcl-Nacl-Nasulf-Na Asc-C) .... As Per Prep Instructions. 17)  Morphine Sulfate 15 Mg Tabs (Morphine Sulfate) .... Take One Pill By Mouth Three Times A Day As Needed For Pain  Current Medications (verified): 1)  Plavix 75 Mg Tabs (Clopidogrel Bisulfate) .... Take One Tablet By Mouth Daily 2)  Enalapril Maleate 20 Mg Tabs (Enalapril Maleate) .... Take One Tablet By Mouth Daily 3)  Norvasc 10 Mg Tabs (Amlodipine Besylate) .... Take One Tablet By Mouth Daily 4)  Lipitor 10 Mg Tabs (Atorvastatin Calcium) .... Take One Tablet  By Mouth Daily 5)  Niacor 500 Mg Tabs (Niacin) .... Take 1 Tablet By Mouth Once A  Day 6)  Coreg 25 Mg Tabs (Carvedilol) .... Take One Tablet By Mouth Twice Daily 7)  Ambien 10 Mg Tabs (Zolpidem Tartrate) .... Take One Tablet By Mouth At Bedtime 8)  Neurontin 300 Mg Caps (Gabapentin) .... Take One Tablet By Mouth Three Times A Day 9)  Lasix 40 Mg Tabs (Furosemide) .... Take One Tablet By Mouth Three Times A Day 10)  Zyrtec 10 Mg Tabs (Cetirizine Hcl) .... Take One Tablet By Mouth Daily 11)  Venlafaxine Hcl 50 Mg Tabs (Venlafaxine Hcl) .... Take One Talbet By Mouth At Bedtime 12)  Amitriptyline Hcl 100 Mg Tabs (Amitriptyline Hcl) .... Take 1 Tablet By Mouth At Bedtime 13)  Metformin Hcl 850 Mg Tabs (Metformin Hcl) .... Take 1 Tablet By Mouth Two Times A Day 14)  Glipizide 10 Mg  Tb24 (Glipizide) .... Take 1 Tablet By Mouth Two Times A Day 15)  Ms Contin 60 Mg Xr12h-Tab (Morphine Sulfate) .... Take 1 Tablet By Mouth Two Times A Day 16)  Moviprep 100 Gm  Solr (Peg-Kcl-Nacl-Nasulf-Na Asc-C) .... As Per Prep Instructions. 17)  Morphine Sulfate 15 Mg Tabs (Morphine Sulfate) .... Take One Pill By Mouth Three Times A Day As Needed For Pain  Allergies (verified): No Known Drug Allergies  Past History:  Family History: Last updated: 10-18-2009 Mother died of an MI in her 20s.  Father died from complications of cirrhosis. Brother had a fatal MI. No FH of Colon Cancer:  Social History: Last updated: 2009-10-18 Tobacco: almost 1 PPD x 50 yrs.  Now smokes 5-7 cigarettes/day. Had 5 children, now 49. 32 y/o dgtr died 05/01/2008 from ESRD. His sister constantly checks in on him and comes with him to all his apptmts. Pt has an aide to help him during the day. Alcohol- occ. 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 Daily Caffeine Use  occ  Risk Factors: Exercise: no (11/02/2009)  Risk Factors: Smoking Status: current (11/02/2009) Packs/Day: 5 cig per day (11/02/2009)  Past Medical History: Reviewed history from 10/02/2009 and no  changes required. 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 Riley Kill  for pain mgmt  (painful stump) HEPATITIS C    - doc'd 12/2002 CHOLELITHIASIS - documented in 07/2007 on abdominal CT scan COCAINE USE    - s/p cardiopulmonary arrest 12/25/2002 [facility])    - 2D echo: 20-25% LVEF, cardiac cath: 25-30% mid RCA stenosis, LVEF 40%, EPS study: LBBB but      otherwise normal Allergic rhinitis THROMBOCYTOPENIA    - 07/2007 while hospitalized. Went down to 90. Thought to be 2/2 Neurontin.  Past Surgical History: Reviewed history from 09/20/2009 and no changes required. s/p L AKA 06-Apr-2004 Cyst removel on left shoulder  Family History: Reviewed history from 09/20/2009 and no changes required. Mother died of an MI in her 21s.  Father died from complications of cirrhosis. Brother had a fatal MI. No FH of Colon Cancer:  Social History: Reviewed history from 09/20/2009 and no changes required. Tobacco: almost 1 PPD x 50 yrs.  Now smokes 5-7 cigarettes/day. Had 5 children, now 69. 69 y/o dgtr died 2008-04-06 from ESRD. His sister constantly checks in on him and comes with him to all his apptmts. Pt has an aide to help him during the day. Alcohol- occ. 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 Daily Caffeine Use  occ  Review of Systems      See HPI  Physical Exam  General:  alert, well-developed, well-nourished, and well-hydrated.   Head:  normocephalic and atraumatic.   Neck:  no masses.   Lungs:  normal respiratory  effort, normal breath sounds, no crackles, and no wheezes.   Heart:  normal rate, regular rhythm, no gallop, no rub, and no JVD.  2/6 SEM Abdomen:  soft, non-tender, normal bowel sounds, no distention, no masses, no guarding, no rigidity, and no rebound tenderness.   Pulses:  R radial normal.   Extremities:  Left AKA, ++ edema in R LE. Neurologic:  alert & oriented X3.     Impression & Recommendations:  Problem # 1:  HYPERTENSION (ICD-401.9) BP well controlled. Continue current regimen.  His updated medication list for this problem includes:    Enalapril Maleate 20 Mg Tabs (Enalapril maleate) .Jonathon Galvan... Take one tablet by mouth daily    Norvasc 10 Mg Tabs (Amlodipine besylate) .Jonathon Galvan... Take one tablet by mouth daily    Coreg 25 Mg Tabs (Carvedilol) .Jonathon Galvan... Take one tablet by mouth twice daily    Lasix 40 Mg Tabs (Furosemide) .Jonathon Galvan... Take one tablet by mouth three times a day  BP today: 130/69 Prior BP: 137/76 (10/19/2009)  Prior 10 Yr Risk Heart Disease: Not enough information (02/02/2007)  Labs Reviewed: K+: 3.7 (09/07/2009) Creat: : 1.09 (09/07/2009)   Chol: 115 (07/07/2008)   HDL: 31 (07/07/2008)   LDL: 52 (07/07/2008)   TG: 162 (07/07/2008)  Problem # 2:  LEG PAIN, CHRONIC, LEFT (ICD-729.5) Reports not hurting currently. Continue current regimen.   Labs Reviewed: Creat: 1.09 (09/07/2009)    Reviewed HgBA1c results: 7.4 (09/07/2009)  6.3 (04/26/2009)  Complete Medication List: 1)  Plavix 75 Mg Tabs (Clopidogrel bisulfate) .... Take one tablet by mouth daily 2)  Enalapril Maleate 20 Mg Tabs (Enalapril maleate) .... Take one tablet by mouth daily 3)  Norvasc 10 Mg Tabs (Amlodipine besylate) .... Take one tablet by mouth daily 4)  Lipitor 10 Mg Tabs (Atorvastatin calcium) .... Take one tablet  by mouth daily 5)  Niacor 500 Mg Tabs (Niacin) .... Take 1 tablet by mouth once a day 6)  Coreg 25 Mg Tabs (Carvedilol) .Jonathon KitchenMarland KitchenMarland Galvan  Take one tablet by mouth twice daily 7)  Ambien 10 Mg Tabs (Zolpidem  tartrate) .... Take one tablet by mouth at bedtime 8)  Neurontin 300 Mg Caps (Gabapentin) .... Take one tablet by mouth three times a day 9)  Lasix 40 Mg Tabs (Furosemide) .... Take one tablet by mouth three times a day 10)  Zyrtec 10 Mg Tabs (Cetirizine hcl) .... Take one tablet by mouth daily 11)  Venlafaxine Hcl 50 Mg Tabs (Venlafaxine hcl) .... Take one talbet by mouth at bedtime 12)  Amitriptyline Hcl 100 Mg Tabs (Amitriptyline hcl) .... Take 1 tablet by mouth at bedtime 13)  Metformin Hcl 850 Mg Tabs (Metformin hcl) .... Take 1 tablet by mouth two times a day 14)  Glipizide 10 Mg Tb24 (Glipizide) .... Take 1 tablet by mouth two times a day 15)  Ms Contin 60 Mg Xr12h-tab (Morphine sulfate) .... Take 1 tablet by mouth two times a day 16)  Moviprep 100 Gm Solr (Peg-kcl-nacl-nasulf-na asc-c) .... As per prep instructions. 17)  Morphine Sulfate 15 Mg Tabs (Morphine sulfate) .... Take one pill by mouth three times a day as needed for pain  Patient Instructions: 1)  Please schedule a follow-up appointment in 3 months. 2)  Take all the medications as advised below 3)  Tobacco is very bad for your health and your loved ones! You Should stop smoking!. 4)  Stop Smoking Tips: Choose a Quit date. Cut down before the Quit date. decide what you will do as a substitute when you feel the urge to smoke(gum,toothpick,exercise). 5)  Check your blood sugars regularly. If your readings are usually above : 300 or below 70 you should contact our office.   Prevention & Chronic Care Immunizations   Influenza vaccine: Fluvax MCR  (04/26/2009)   Influenza vaccine deferral: Deferred  (10/02/2009)    Tetanus booster: Not documented   Td booster deferral: Not indicated  (10/09/2009)    Pneumococcal vaccine: Pneumovax  (10/02/2009)    H. zoster vaccine: Not documented   H. zoster vaccine deferral: Not indicated  (10/09/2009)  Colorectal Screening   Hemoccult: Not documented   Hemoccult action/deferral:  Refused  (10/09/2009)    Colonoscopy: DONE  (10/23/2009)   Colonoscopy action/deferral: Refused  (10/09/2009)   Colonoscopy due: 01/2010  Other Screening   PSA: 0.10  (10/02/2009)   PSA action/deferral: Discussed-decision deferred  (10/02/2009)   Smoking status: current  (11/02/2009)   Smoking cessation counseling: yes  (11/02/2009)  Diabetes Mellitus   HgbA1C: 7.4  (09/07/2009)    Eye exam: Not documented   Diabetic eye exam action/deferral: Deferred  (10/09/2009)    Foot exam: yes  (10/27/2007)   Foot exam action/deferral: Do today   High risk foot: Not documented   Foot care education: Not documented    Urine microalbumin/creatinine ratio: 419.9  (04/26/2009)  Lipids   Total Cholesterol: 115  (07/07/2008)   Lipid panel action/deferral: Lipid Panel ordered   LDL: 52  (07/07/2008)   LDL Direct: Not documented   HDL: 31  (07/07/2008)   Triglycerides: 162  (07/07/2008)    SGOT (AST): 33  (10/02/2009)   SGPT (ALT): 29  (10/02/2009)   Alkaline phosphatase: 65  (10/02/2009)   Total bilirubin: 0.4  (10/02/2009)  Hypertension   Last Blood Pressure: 130 / 69  (11/02/2009)   Serum creatinine: 1.09  (09/07/2009)   BMP action: Ordered   Serum potassium 3.7  (09/07/2009)  Self-Management Support :   Personal Goals (by the next clinic visit) :  Personal A1C goal: 7  (10/02/2009)     Personal blood pressure goal: 130/80  (10/02/2009)     Personal LDL goal: 100  (10/02/2009)    Patient will work on the following items until the next clinic visit to reach self-care goals:     Medications and monitoring: take my medicines every day, check my blood sugar, bring all of my medications to every visit, examine my feet every day  (11/02/2009)     Eating: drink diet soda or water instead of juice or soda, eat more vegetables, use fresh or frozen vegetables, eat foods that are low in salt, eat fruit for snacks and desserts, limit or avoid alcohol  (11/02/2009)     Activity: take a  30 minute walk every day  (11/02/2009)    Diabetes self-management support: Resources for patients handout  (11/02/2009)   Last diabetes self-management training by diabetes educator: 08/26/2007   Last medical nutrition therapy: 08/26/2007    Hypertension self-management support: Resources for patients handout  (11/02/2009)    Lipid self-management support: Resources for patients handout  (11/02/2009)        Resource handout printed.

## 2010-07-25 NOTE — Assessment & Plan Note (Signed)
Summary: DM TEACHING/CH   Vital Signs:  Patient profile:   65 year old male Weight:      215.9 pounds BMI:     31.54 Is Patient Diabetic? Yes Did you bring your meter with you today? No Comments 114 this morning   Allergies: No Known Drug Allergies   Complete Medication List: 1)  Plavix 75 Mg Tabs (Clopidogrel bisulfate) .... Take one tablet by mouth daily 2)  Enalapril Maleate 20 Mg Tabs (Enalapril maleate) .... Take one tablet by mouth twice daily. 3)  Norvasc 10 Mg Tabs (Amlodipine besylate) .... Take one tablet by mouth daily 4)  Lipitor 10 Mg Tabs (Atorvastatin calcium) .... Take one tablet  by mouth daily 5)  Niacor 500 Mg Tabs (Niacin) .... Take 1 tablet by mouth once a day 6)  Coreg 25 Mg Tabs (Carvedilol) .... Take one tablet by mouth twice daily 7)  Ambien 10 Mg Tabs (Zolpidem tartrate) .... Take one tablet by mouth at bedtime 8)  Neurontin 300 Mg Caps (Gabapentin) .... Take one tablet by mouth three times a day 9)  Lasix 40 Mg Tabs (Furosemide) .... Take one tablet by mouth three times a day 10)  Zyrtec 10 Mg Tabs (Cetirizine hcl) .... Take one tablet by mouth daily 11)  Venlafaxine Hcl 50 Mg Tabs (Venlafaxine hcl) .... Take one talbet by mouth at bedtime 12)  Amitriptyline Hcl 100 Mg Tabs (Amitriptyline hcl) .... Take 1 tablet by mouth at bedtime 13)  Metformin Hcl 850 Mg Tabs (Metformin hcl) .... Take 1 tablet by mouth two times a day 14)  Glipizide 10 Mg Tb24 (Glipizide) .... Take 1 tablet by mouth two times a day 15)  Ms Contin 60 Mg Xr12h-tab (Morphine sulfate) .... Take 1 tablet by mouth two times a day 16)  Morphine Sulfate 15 Mg Tabs (Morphine sulfate) .... Take one pill by mouth three times a day as needed for pain  Other Orders: DSMT(Medicare) Individual, 30 Minutes (G0108)   Orders Added: 1)  DSMT(Medicare) Individual, 30 Minutes [G0108]    Diabetes Self Management Training  PCP: Lars Mage MD Date diagnosed with diabetes: 08/13/2007 Diabetes  Type: Type 2 non-insulin Other persons present: sister - chi chi Current smoking Status: current  Vital Signs Todays Weight: 215.9lb  in BMI 31.54in-lbs   Diabetes Medications:  Comments: glipizide 8 am and 9 Pm, metformin- 8 am and pm, on sundays takes the morning dose at 1 PM after church, does not miss medicine.  Patient reports home Blood sugars: breakfast - ~ 99, dinner- highest  ~142, bedtime readings-  ~ 130- we discussed where his A1C is currently and trend upwards. Patient seems intersteding in food, diet to help improve his diabetes. Sister who is his support and also has diabetes seems resistant to assistance with making changes. She assistes patient with food shopping and cooking as well as diabetes knowledge/support.     Estimated /Usual Carb Intake Breakfast # of Carbs/Grams frsoted flakes, milk, banana  Nutrition assessment Who does the food shopping? Other- sister chi chi Who does the cooking?  Other- sister chi chi Do you add salt to food? Yes What beverages do you drink?  regualr soda daily- 2-4 glasses, he is tryign to drink more water Biggest challenge to eating healthy: Eating/drinking too much  Activity Limitations  Inadequate physical activity Diabetes Disease Process  Discussed today Define diabetes in simple terms: No knowledgeState own type of diabetes: Needs review/assistance   State diabetes is treated by meal plan-exercise-medication-monitoring-education: Needs  review/assistance    Medications State name-action-dose-duration-side effects-and time to take medication: Needs review/assistance   State appropriate timing of food related to medication: Needs review/assistance Nutritional Management Identify what foods most often affect blood glucose: Needs review/assistance    State changes planned for home meals/snacks: Needs review/assistance    Monitoring State purpose and frequency of monitoring BG-ketones-HgbA1C  : Needs review/assistance     State target blood glucose and HgbA1C goals: Needs review/assistance    Complications State the causes-signs and symptoms and prevention of Hyperglycemia: Needs review/assistance   Explain proper treatment of hyperglycemia: Needs review/assistance    Exercise  Lifestyle changes:Goal setting and Problem solving State benefits of making appropriate lifestyle changes: No knowledge   Identify lifestyle behaviors that need to change: Needs review/assistance   Identify risk factors that interfere with health: Needs review/assistance   Develop strategies to reduce risk factors: Needs review/assistance   Verbalize need for and frequency of health care follow-up: Needs review/assistance   Diabetes Management Education Done: 04/08/2010    BEHAVIORAL GOALS INITIAL Incorporating appropriate nutritional management: currently working on decreasing regular soda- we did not get to discuss speciific goal today

## 2010-07-25 NOTE — Assessment & Plan Note (Signed)
Summary: est-one mth f/u visit//ch   Vital Signs:  Patient profile:   65 year old male Height:      69.5 inches (176.53 cm) Weight:      212.0 pounds (96.36 kg) BMI:     30.97 Temp:     97.3 degrees F (36.28 degrees C) oral Pulse rate:   69 / minute BP sitting:   143 / 80  (right arm) Cuff size:   large  Vitals Entered By: Cynda Familia Duncan Dull) (May 20, 2010 1:57 PM)    CC: routine f/u, refill on pain med Is Patient Diabetic? Yes Did you bring your meter with you today? No Pain Assessment Patient in pain? yes     Location: left thigh Intensity: 6 Type: sharp Onset of pain  Chronic Nutritional Status BMI of > 30 = obese CBG Result 185  Does patient need assistance? Functional Status Self care Ambulation Impaired:Risk for fall, Wheelchair Comments arrived in w/c and cane   Primary Care Provider:  Lars Mage MD  CC:  routine f/u and refill on pain med.  History of Present Illness: Patient is here today for a routine follow up.  Leg lesions: The lesions have healed up by itself and his skin looks good with no new complaints.  Hyperlipidemia: Will check lipid panel today.  HTN: At goal.  CHF: Refer to cardiology for assessment for ICD placement.  CAD: Plavix is not required at this time. Will switch it to aspirin.  Pain meds: Refillpain meds today.  Patient was counseled on smoking cessation strategies including medications and behavior modification options. trying toquit and hant smoked since 4 pm yesterday.  Preventive Screening-Counseling & Management  Alcohol-Tobacco     Alcohol drinks/day: 0     Smoking Status: current     Smoking Cessation Counseling: yes     Smoke Cessation Stage: ready     Packs/Day: <0.25     Year Started: 1990  Problems Prior to Update: 1)  CHF  (ICD-428.0) 2)  Hypertension  (ICD-401.9) 3)  Diabetes Mellitus, Type II  (ICD-250.00) 4)  Dyslipidemia  (ICD-272.4) 5)  Pvd  (ICD-443.9) 6)  Tobacco Use  (ICD-305.1) 7)   Thrombocytopenia  (ICD-287.5) 8)  Hepatitis C  (ICD-070.51) 9)  Asthma  (ICD-493.90) 10)  Allergic Rhinitis  (ICD-477.9)  Medications Prior to Update: 1)  Plavix 75 Mg Tabs (Clopidogrel Bisulfate) .... Take One Tablet By Mouth Daily 2)  Enalapril Maleate 20 Mg Tabs (Enalapril Maleate) .... Take One Tablet By Mouth Twice Daily. 3)  Norvasc 10 Mg Tabs (Amlodipine Besylate) .... Take One Tablet By Mouth Daily 4)  Lipitor 10 Mg Tabs (Atorvastatin Calcium) .... Take One Tablet  By Mouth Daily 5)  Niacor 500 Mg Tabs (Niacin) .... Take 1 Tablet By Mouth Once A Day 6)  Coreg 25 Mg Tabs (Carvedilol) .... Take One Tablet By Mouth Twice Daily 7)  Ambien 10 Mg Tabs (Zolpidem Tartrate) .... Take One Tablet By Mouth At Bedtime 8)  Neurontin 300 Mg Caps (Gabapentin) .... Take One Tablet By Mouth Three Times A Day 9)  Lasix 40 Mg Tabs (Furosemide) .... Take One Tablet By Mouth Three Times A Day 10)  Zyrtec 10 Mg Tabs (Cetirizine Hcl) .... Take One Tablet By Mouth Daily 11)  Venlafaxine Hcl 50 Mg Tabs (Venlafaxine Hcl) .... Take One Talbet By Mouth At Bedtime 12)  Amitriptyline Hcl 100 Mg Tabs (Amitriptyline Hcl) .... Take 1 Tablet By Mouth At Bedtime 13)  Metformin Hcl 850 Mg Tabs (Metformin  Hcl) .... Take 1 Tablet By Mouth Two Times A Day 14)  Glipizide 10 Mg  Tb24 (Glipizide) .... Take 1 Tablet By Mouth Two Times A Day 15)  Ms Contin 60 Mg Xr12h-Tab (Morphine Sulfate) .... Take 1 Tablet By Mouth Two Times A Day 16)  Morphine Sulfate 15 Mg Tabs (Morphine Sulfate) .... Take One Pill By Mouth Three Times A Day As Needed For Pain  Current Medications (verified): 1)  Plavix 75 Mg Tabs (Clopidogrel Bisulfate) .... Take One Tablet By Mouth Daily 2)  Enalapril Maleate 20 Mg Tabs (Enalapril Maleate) .... Take One Tablet By Mouth Twice Daily. 3)  Norvasc 10 Mg Tabs (Amlodipine Besylate) .... Take One Tablet By Mouth Daily 4)  Lipitor 10 Mg Tabs (Atorvastatin Calcium) .... Take One Tablet  By Mouth Daily 5)   Niacor 500 Mg Tabs (Niacin) .... Take 1 Tablet By Mouth Once A Day 6)  Coreg 25 Mg Tabs (Carvedilol) .... Take One Tablet By Mouth Twice Daily 7)  Ambien 10 Mg Tabs (Zolpidem Tartrate) .... Take One Tablet By Mouth At Bedtime 8)  Neurontin 300 Mg Caps (Gabapentin) .... Take One Tablet By Mouth Three Times A Day 9)  Lasix 40 Mg Tabs (Furosemide) .... Take One Tablet By Mouth Three Times A Day 10)  Zyrtec 10 Mg Tabs (Cetirizine Hcl) .... Take One Tablet By Mouth Daily 11)  Venlafaxine Hcl 50 Mg Tabs (Venlafaxine Hcl) .... Take One Talbet By Mouth At Bedtime 12)  Amitriptyline Hcl 100 Mg Tabs (Amitriptyline Hcl) .... Take 1 Tablet By Mouth At Bedtime 13)  Metformin Hcl 850 Mg Tabs (Metformin Hcl) .... Take 1 Tablet By Mouth Two Times A Day 14)  Glipizide 10 Mg  Tb24 (Glipizide) .... Take 1 Tablet By Mouth Two Times A Day 15)  Ms Contin 60 Mg Xr12h-Tab (Morphine Sulfate) .... Take 1 Tablet By Mouth Two Times A Day 16)  Morphine Sulfate 15 Mg Tabs (Morphine Sulfate) .... 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 Riley Kill for pain mgmt  (painful stump) HEPATITIS C    - doc'd 12/2002 CHOLELITHIASIS - documented in 07/2007 on abdominal CT scan COCAINE USE    - s/p cardiopulmonary arrest 12/25/2002 [name])    - 2D echo: 20-25% LVEF, cardiac cath: 25-30% mid RCA stenosis, LVEF 40%, EPS study: LBBB but       otherwise normal Allergic rhinitis THROMBOCYTOPENIA    - 07/2007 while hospitalized. Went down to 90. Thought to be 2/2 Neurontin.  Past Surgical History: Last updated: 2009/10/06 s/p L AKA 03/2004 Cyst removel on left shoulder  Family History: Last updated: 10-06-2009 Mother died of an MI in her 84s.  Father died from complications of cirrhosis. Brother had a fatal MI. No FH of Colon Cancer:  Social History: Last updated: October 06, 2009 Tobacco: almost  1 PPD x 50 yrs.  Now smokes 5-7 cigarettes/day. Had 5 children, now 39. 2 y/o dgtr died May 01, 2008 from ESRD. His sister constantly checks in on him and comes with him to all his apptmts. Pt has an aide to help him during the day. Alcohol- occ. 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 Daily Caffeine Use  occ  Risk Factors: Alcohol Use: 0 (05/20/2010) Exercise: no (04/11/2010)  Risk Factors: Smoking Status: current (05/20/2010) Packs/Day: <0.25 (05/20/2010)  Family History: Reviewed history from 09/20/2009 and no changes required. Mother died of an MI in her 22s.  Father died from complications of cirrhosis. Brother had a fatal MI. No FH of Colon Cancer:  Social History: Reviewed history from 09/20/2009 and no changes required. Tobacco: almost 1 PPD x 50 yrs.  Now smokes 5-7 cigarettes/day. Had 5 children, now 41. 83 y/o dgtr died May 01, 2008 from ESRD. His sister constantly checks in on him and comes with him to all his apptmts. Pt has an aide to help him during the day. Alcohol- occ. 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 Daily Caffeine Use  occ  Review of Systems      See HPI  Physical Exam  Additional Exam:  Gen: AOx3, in no acute distress Eyes: PERRL, EOMI ENT:MMM, No erythema noted in posterior pharynx Neck: No JVD, No LAP Chest: CTAB with  good respiratory effort CVS: regular rhythmic rate, NO M/R/G, S1 S2 normal Abdo:  soft,ND, BS+x4, Non tender and No hepatosplenomegaly EXT: No odema noted, left AKA Neuro: Non focal, gait is normal Skin: no rashes noted.    Impression & Recommendations:  Problem # 1:  CHF (ICD-428.0) Assessment Unchanged Refer to cardiology.  The following medications were removed from the medication list:    Plavix 75 Mg Tabs (Clopidogrel bisulfate) .Marland Kitchen... Take one tablet by mouth daily His updated medication list for this problem includes:    Enalapril Maleate 20 Mg Tabs (Enalapril maleate) .Marland Kitchen... Take one tablet by mouth twice daily.    Coreg 25 Mg Tabs (Carvedilol) .Marland Kitchen... Take one tablet by mouth twice daily    Lasix 40 Mg Tabs (Furosemide) .Marland Kitchen... Take one tablet by mouth three times a day    Aspir-low 81 Mg Tbec (Aspirin) .Marland Kitchen... Take 1 tablet by mouth once a day  Orders: Cardiology Referral (Cardiology)  Problem # 2:  CAD (ICD-414.00) Assessment: Unchanged Will change plavix to aspirin as he does not have a reason to be on plavix.  The following medications were removed from the medication list:    Plavix 75 Mg Tabs (Clopidogrel bisulfate) .Marland Kitchen... Take one tablet by mouth daily His updated medication list for this problem includes:    Enalapril Maleate 20 Mg Tabs (Enalapril maleate) .Marland Kitchen... Take one tablet by mouth twice daily.    Norvasc 10 Mg Tabs (Amlodipine besylate) .Marland Kitchen... Take one tablet by mouth daily    Coreg 25 Mg Tabs (Carvedilol) .Marland Kitchen... Take one tablet by mouth twice daily    Lasix 40 Mg Tabs (Furosemide) .Marland Kitchen... Take one tablet by mouth three times a day    Aspir-low 81 Mg Tbec (Aspirin) .Marland Kitchen... Take 1 tablet by mouth once a day  Labs Reviewed: Chol: 115 (07/07/2008)   HDL: 31 (07/07/2008)   LDL: 52 (07/07/2008)   TG: 162 (07/07/2008)  Problem # 3:  HYPERTENSION (ICD-401.9) Assessment: Unchanged At goal. His updated medication list for this problem includes:  Enalapril Maleate 20 Mg Tabs (Enalapril maleate) .Marland Kitchen... Take one tablet by mouth twice daily.    Norvasc 10  Mg Tabs (Amlodipine besylate) .Marland Kitchen... Take one tablet by mouth daily    Coreg 25 Mg Tabs (Carvedilol) .Marland Kitchen... Take one tablet by mouth twice daily    Lasix 40 Mg Tabs (Furosemide) .Marland Kitchen... Take one tablet by mouth three times a day  Problem # 4:  DYSLIPIDEMIA (ICD-272.4) Assessment: Unchanged Lipid profile today.  His updated medication list for this problem includes:    Lipitor 10 Mg Tabs (Atorvastatin calcium) .Marland Kitchen... Take one tablet  by mouth daily    Niacor 500 Mg Tabs (Niacin) .Marland Kitchen... Take 1 tablet by mouth once a day  Orders: T-Lipid Profile ([id number])  Problem # 5:  Preventive Health Care (ICD-V70.0) Patient was counseled on smoking cessation strategies including medications and behavior modification options.  rest uptodate.  Problem # 6:  DIABETES MELLITUS, TYPE II (ICD-250.00) Assessment: Unchanged  Recently checked.  Will increae metformin to 1000 two times a day. His updated medication list for this problem includes:    Enalapril Maleate 20 Mg Tabs (Enalapril maleate) .Marland Kitchen... Take one tablet by mouth twice daily.    Metformin Hcl 1000 Mg Tabs (Metformin hcl) .Marland Kitchen... Take 1 tablet by mouth two times a day    Glipizide 10 Mg Tb24 (Glipizide) .Marland Kitchen... Take 1 tablet by mouth two times a day    Aspir-low 81 Mg Tbec (Aspirin) .Marland Kitchen... Take 1 tablet by mouth once a day  Labs Reviewed: Creat: 1.10 (04/30/2010)    Reviewed HgBA1c results: 7.5 (03/04/2010)  7.4 (09/07/2009)  Orders: T- Capillary Blood Glucose (30865)  Problem # 7:  PVD (ICD-443.9) Assessment: Improved Skin changes have healed up. Will continue to see him as needed.  Complete Medication List: 1)  Enalapril Maleate 20 Mg Tabs (Enalapril maleate) .... Take one tablet by mouth twice daily. 2)  Norvasc 10 Mg Tabs (Amlodipine besylate) .... Take one tablet by mouth daily 3)  Lipitor 10 Mg Tabs (Atorvastatin calcium) .... Take one tablet  by mouth daily 4)  Niacor 500 Mg Tabs (Niacin) .... Take 1 tablet by mouth once a day 5)   Coreg 25 Mg Tabs (Carvedilol) .... Take one tablet by mouth twice daily 6)  Ambien 10 Mg Tabs (Zolpidem tartrate) .... Take one tablet by mouth at bedtime 7)  Neurontin 300 Mg Caps (Gabapentin) .... Take one tablet by mouth three times a day 8)  Lasix 40 Mg Tabs (Furosemide) .... Take one tablet by mouth three times a day 9)  Zyrtec 10 Mg Tabs (Cetirizine hcl) .... Take one tablet by mouth daily 10)  Venlafaxine Hcl 50 Mg Tabs (Venlafaxine hcl) .... Take one talbet by mouth at bedtime 11)  Amitriptyline Hcl 100 Mg Tabs (Amitriptyline hcl) .... Take 1 tablet by mouth at bedtime 12)  Metformin Hcl 1000 Mg Tabs (Metformin hcl) .... Take 1 tablet by mouth two times a day 13)  Glipizide 10 Mg Tb24 (Glipizide) .... Take 1 tablet by mouth two times a day 14)  Ms Contin 60 Mg Xr12h-tab (Morphine sulfate) .... Take 1 tablet by mouth two times a day 15)  Morphine Sulfate 15 Mg Tabs (Morphine sulfate) .... Take one pill by mouth three times a day as needed for pain 16)  Aspir-low 81 Mg Tbec (Aspirin) .... Take 1 tablet by mouth once a day  Patient Instructions: 1)  Please schedule a follow-up appointment in 3 months. 2)  Limit your Sodium (Salt). 3)  Tobacco is very bad for your health and your loved ones! You Should stop smoking!. 4)  Stop Smoking Tips: Choose a Quit date. Cut down before the Quit date. decide what you will do as a substitute when you feel the urge to smoke(gum,toothpick,exercise). 5)  It is important that you exercise regularly at least 20 minutes 5 times a week. If you develop chest pain, have severe difficulty breathing, or feel very tired , stop exercising immediately and seek medical attention. 6)  It is important that your Diabetic A1c level is checked every 3 months. 7)  Check your Blood Pressure regularly. If it is above: 180/100 you should make an appointment. Prescriptions: MORPHINE SULFATE 15 MG TABS (MORPHINE SULFATE) Take one pill by mouth three times a day as needed for  pain  #90 x 0   Entered and Authorized by:   Lars Mage MD   Signed by:   Lars Mage MD on 05/20/2010   Method used:   Reprint   RxID:   [id number] MS CONTIN 60 MG XR12H-TAB (MORPHINE SULFATE) Take 1 tablet by mouth two times a day  #60 x 0   Entered and Authorized by:   Lars Mage MD   Signed by:   Lars Mage MD on 05/20/2010   Method used:   Reprint   RxID:   [id number] METFORMIN HCL 1000 MG TABS (METFORMIN HCL) Take 1 tablet by mouth two times a day  #60 x 11   Entered and Authorized by:   Lars Mage MD   Signed by:   Lars Mage MD on 05/20/2010   Method used:   Faxed to ...       [facility] (retail)       [address]       [place], [place]  [address]       Ph: [phone]       Fax: [phone]   RxID:   [id number] ASPIR-LOW 81 MG TBEC (ASPIRIN) Take 1 tablet by mouth once a day  #30 x 11   Entered and Authorized by:   Lars Mage MD   Signed by:   Lars Mage MD on 05/20/2010   Method used:   Faxed to ...       [facility] (retail)       [address]       [place], [place]  [address]       Ph: [phone]       Fax: [phone]   RxID:   [id number] MORPHINE SULFATE 15 MG TABS (MORPHINE SULFATE) Take one pill by mouth three times a day as needed for pain  #90 x 0   Entered and Authorized by:   Lars Mage MD   Signed by:   Lars Mage MD on 05/20/2010   Method used:   Print then Give to Patient   RxID:   [id number] MS CONTIN 60 MG XR12H-TAB (MORPHINE SULFATE) Take 1 tablet by mouth two times a day  #60 x 0   Entered and Authorized by:   Lars Mage MD   Signed by:   Lars Mage MD on 05/20/2010   Method used:   Print then Give to Patient   RxID:   [id number]    Orders Added: 1)  T-Lipid Profile [[id number]] 2)  Cardiology Referral [Cardiology] 3)  Est. Patient Level IV [89381] 4)  T- Capillary  Blood Glucose [[id number]]   Process Orders Check Orders Results:     [facility]: Check successful Tests Sent for requisitioning (May 20, 2010 4:58 PM):     05/20/2010: [facility] -- T-Lipid Profile [phone] (signed)     Prevention & Chronic Care Immunizations   Influenza vaccine: Fluvax 3+  (04/08/2010)   Influenza vaccine deferral: Deferred  (10/02/2009)    Tetanus booster: 04/29/2010: Tdap   Td booster deferral: Not indicated  (10/09/2009)    Pneumococcal vaccine: Pneumovax  (10/02/2009)    H. zoster vaccine: Not documented   H. zoster vaccine deferral: Not indicated  (10/09/2009)  Colorectal Screening   Hemoccult: Not documented   Hemoccult action/deferral: Refused  (10/09/2009)    Colonoscopy: DONE  (10/23/2009)   Colonoscopy action/deferral: Refused  (10/09/2009)   Colonoscopy due: 01/2010  Other Screening   PSA: 0.10  (10/02/2009)   PSA action/deferral: Discussed-decision deferred  (10/02/2009)   Smoking status: current  (05/20/2010)   Smoking cessation counseling: yes  (05/20/2010)  Diabetes Mellitus   HgbA1C: 7.5  (03/04/2010)    Eye exam: Not documented   Diabetic eye exam action/deferral: Deferred  (10/09/2009)    Foot exam: yes  (03/04/2010)   Foot exam action/deferral: Do today   High risk foot: Yes  (03/04/2010)   Foot care education: Done  (03/04/2010)    Urine microalbumin/creatinine ratio: 952.9  (04/30/2010)   Urine microalbumin action/deferral: Ordered  Lipids   Total Cholesterol: 115  (07/07/2008)   Lipid panel action/deferral: Lipid Panel ordered   LDL: 52  (07/07/2008)   LDL Direct: Not documented   HDL: 31  (07/07/2008)   Triglycerides: 162  (07/07/2008)    SGOT (AST): 42  (03/04/2010)   SGPT (ALT): 31  (03/04/2010)   Alkaline phosphatase: 72  (03/04/2010)   Total bilirubin: 0.5  (03/04/2010)  Hypertension   Last Blood Pressure: 143 / 80  (05/20/2010)   Serum creatinine: 1.10  (04/30/2010)   BMP action: Ordered   Serum potassium 3.7   (04/30/2010)  Self-Management Support :   Personal Goals (by the next clinic visit) :     Personal A1C goal: 7  (10/02/2009)     Personal blood pressure goal: 130/80  (10/02/2009)     Personal LDL goal: 100  (10/02/2009)    Patient will work on the following items until the next clinic visit to reach self-care goals:     Medications and monitoring: take my medicines every day  (05/20/2010)     Eating: eat foods that are low in salt, eat baked foods instead of fried foods  (05/20/2010)     Activity: take a 30 minute walk every day  (11/02/2009)    Diabetes self-management support: Written self-care plan  (05/20/2010)   Diabetes care plan printed   Last diabetes self-management training by diabetes educator: 04/08/2010   Last medical nutrition therapy: 08/26/2007    Hypertension self-management support: Written self-care plan  (05/20/2010)   Hypertension self-care plan printed.    Lipid self-management support: Written self-care plan  (05/20/2010)   Lipid self-care plan printed.    Process Orders Check Orders Results:     [facility]: Check successful Tests Sent for requisitioning (May 20, 2010 4:58 PM):     05/20/2010: [facility] -- T-Lipid Profile [phone] (signed)     Laboratory Results   Blood Tests   Date/Time Received: May 20, 2010 2:46 PM Date/Time Reported: Alric Quan  May 20, 2010 2:46 PM   CBG  Random:: 185mg /dL

## 2010-07-25 NOTE — Assessment & Plan Note (Signed)
Summary: R leg, mid, dark w/ ecchomosis to shin area per HHN/pcp-sitko...   Vital Signs:  Patient profile:   65 year old male Height:      69.5 inches (176.53 cm) Weight:      215.6 pounds (98.64 kg) BMI:     31.50 Temp:     97.3 degrees F (36.28 degrees C) oral Pulse (ortho):   69 / minute BP sitting:   135 / 77  (right arm) Cuff size:   large  Vitals Entered By: Theotis Barrio NT II (October 02, 2009 2:42 PM) CC: PHANTOM PAIN LEFT LEG- PAIN MED NOT HELPING   Is Patient Diabetic? Yes Did you bring your meter with you today? UNABLE TO DOWNLOAD METER Pain Assessment Patient in pain? yes     Location: LEFT LEG Intensity:      6 Type: burning Onset of pain  THIS MORNING Nutritional Status BMI of > 30 = obese CBG Result 95 CBG Device ID Pt OWN DEVICE  Have you ever been in a relationship where you felt threatened, hurt or afraid?No   Does patient need assistance? Functional Status Self care Ambulation Impaired:Risk for fall Comments LEFT LEG PHANTOM PAIN / PAIN SINCE THIS MORNING   Primary Care Provider:  Brooks Sailors MD  CC:  PHANTOM PAIN LEFT LEG- PAIN MED NOT HELPING  .  History of Present Illness: Pt is a 65 yo M with DM, HTN, HLD, CHF, PVD who is here for a f/u. He is very sleepy at the time of interview. He says he has lot of phantom pain in the left leg that has been amputated and he is on multiple medications for the same. Which at times makes him sleepy.  He also reports swelling in right lower extremity and tightness of skin. He has no sob at rest. He is not very mobile and does not exert himself, so he can not tell more about his ability to sustain any rigerous acitivity. He does not report any chest pain. He does not report oprthopnea, PND, urine or bladder habit changes. He has no fever.    Preventive Screening-Counseling & Management  Alcohol-Tobacco     Smoking Status: current     Smoking Cessation Counseling: yes     Packs/Day:  0.5  Caffeine-Diet-Exercise     Does Patient Exercise: no  Current Medications (verified): 1)  Plavix 75 Mg Tabs (Clopidogrel Bisulfate) .... Take One Tablet By Mouth Daily 2)  Enalapril Maleate 20 Mg Tabs (Enalapril Maleate) .... Take One Tablet By Mouth Daily 3)  Norvasc 10 Mg Tabs (Amlodipine Besylate) .... Take One Tablet By Mouth Daily 4)  Lipitor 10 Mg Tabs (Atorvastatin Calcium) .... Take One Tablet  By Mouth Daily 5)  Niacor 500 Mg Tabs (Niacin) .... Take 1 Tablet By Mouth Once A Day 6)  Coreg 25 Mg Tabs (Carvedilol) .... Take One Tablet By Mouth Twice Daily 7)  Ambien 10 Mg Tabs (Zolpidem Tartrate) .... Take One Tablet By Mouth At Bedtime 8)  Neurontin 300 Mg Caps (Gabapentin) .... Take One Tablet By Mouth Three Times A Day 9)  Lasix 40 Mg Tabs (Furosemide) .... Take One Tablet By Mouth Daily 10)  Zyrtec 10 Mg Tabs (Cetirizine Hcl) .... Take One Tablet By Mouth Daily 11)  Venlafaxine Hcl 50 Mg Tabs (Venlafaxine Hcl) .... Take One Talbet By Mouth At Bedtime 12)  Amitriptyline Hcl 100 Mg Tabs (Amitriptyline Hcl) .... Take 1 Tablet By Mouth At Bedtime 13)  Metformin Hcl 850 Mg Tabs (  Metformin Hcl) .... Take 1 Tablet By Mouth Two Times A Day 14)  Glipizide 10 Mg  Tb24 (Glipizide) .... Take 1 Tablet By Mouth Two Times A Day 15)  Ms Contin 60 Mg Xr12h-Tab (Morphine Sulfate) .... Take 1 Tablet By Mouth Two Times A Day 16)  Hydrocodone-Acetaminophen 10-325 Mg Tabs (Hydrocodone-Acetaminophen) .... Take 1 Tablet By Mouth Every 8 Hours As Needed For Pain 17)  Moviprep 100 Gm  Solr (Peg-Kcl-Nacl-Nasulf-Na Asc-C) .... As Per Prep Instructions.  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  Father died from complications of cirrhosis. Brother had a fatal MI. No FH of Colon Cancer:  Social  History: Last updated: 07-Oct-2009 Tobacco: almost 1 PPD x 50 yrs.  Now smokes 5-7 cigarettes/day. Had 5 children, now 85. 28 y/o dgtr died Apr 20, 2008 from ESRD. His sister constantly checks in on him and comes with him to all his apptmts. Pt has an aide to help him during the day. Alcohol- occ. 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 Daily Caffeine Use  occ  Risk Factors: Exercise: no (10/02/2009)  Risk Factors: Smoking Status: current (10/02/2009) Packs/Day: 0.5 (10/02/2009)  Past Medical History: DM-II    - dx'd 07/2007: DKA (came in unresponsive)    - initially started on insulin, was switched to oral hypoglycemic agents 6/09 ASTHMA HYPERTENSION NON-ISCHEMIC CMP WITH CHF    - EF 20-25% (echo 11/2005)   -EF not estimated and very poor in 2009 LBBB DYSLIPIDEMIA    - hyperTG on Niaspan    - dyslipidemia on Lipitor AOCD, baseline now 13.0 PERIPHERAL VASCULAR DISEASE    - s/p L AKA 2004-04-20 @ [facility] (in the setting of a septic arthritis: MRSA)    - sees Dr. Riley Kill for pain mgmt  (painful stump) HEPATITIS C    - doc'd 12/2002 CHOLELITHIASIS - documented in 07/2007 on abdominal CT scan COCAINE USE    - s/p cardiopulmonary arrest 12/25/2002 [name])    - 2D echo: 20-25% LVEF, cardiac cath: 25-30% mid RCA stenosis, LVEF 40%, EPS study: LBBB but      otherwise normal Allergic rhinitis THROMBOCYTOPENIA    - 07/2007 while hospitalized. Went down to 90. Thought to be 2/2 Neurontin.  Social History: Does Patient Exercise:  no  Review of Systems      See HPI  Physical Exam  General:  alert, well-developed, well-nourished, and well-hydrated.   Head:  normocephalic and atraumatic.   Eyes:  vision grossly intact, pupils equal, and pupils round.   Mouth:  no gingival abnormalities and pharynx pink and moist.   Neck:  no masses.   Lungs:  normal respiratory effort, normal breath sounds, no crackles, and no wheezes.   Heart:  normal  rate, regular rhythm, no gallop, no rub, and no JVD.  2/6 SEM Abdomen:  soft, non-tender, normal bowel sounds, no distention, no masses, no guarding, no rigidity, and no rebound tenderness.   Msk:  left AKA Extremities:  Left AKA, ++ edema in R LE. Neurologic:  alert & oriented X3 and cranial nerves II-XII intact.   Psych:  Oriented X3.  sleepy.   Impression & Recommendations:  Problem # 1:  DIABETES MELLITUS, TYPE II (ICD-250.00) His A1C is getting worse. He does not know why. He reports compliance. He  might need to go up on metforin if his A1c remains above goal.  His updated medication list for this problem includes:    Enalapril Maleate 20 Mg Tabs (Enalapril maleate) .Marland Kitchen... Take one tablet by mouth daily    Metformin Hcl 850 Mg Tabs (Metformin hcl) .Marland Kitchen... Take 1 tablet by mouth two times a day    Glipizide 10 Mg Tb24 (Glipizide) .Marland Kitchen... Take 1 tablet by mouth two times a day  Orders: 2 D Echo (2 D Echo)  Labs Reviewed: Creat: 1.09 (09/07/2009)    Reviewed HgBA1c results: 7.4 (09/07/2009)  6.3 (04/26/2009)  Problem # 2:  CHF (ICD-428.0) Needs workup. He has not been to cardiologist in years. His Echo last done in 2009 could not estimate EF. It reported sever akinesis. His workup with cath is >6 yrs old. He likely needs ICD. I will refer him to cardiologist for further workup. Given his increasing pedal edema I will go up on lasix 3 times a day. I have asked him to be seen again in 1 week. He does not have much of pulmonary edema and he is preload dependent so I will not over diurese him. If his pedal edema improves next week, we can cut back on lasix. He is on beta blocker and ACE inhibitor. He is on plavix instead of aspirin. (likely for PVD).  His updated medication list for this problem includes:    Plavix 75 Mg Tabs (Clopidogrel bisulfate) .Marland Kitchen... Take one tablet by mouth daily    Enalapril Maleate 20 Mg Tabs (Enalapril maleate) .Marland Kitchen... Take one tablet by mouth daily    Coreg 25 Mg Tabs  (Carvedilol) .Marland Kitchen... Take one tablet by mouth twice daily    Lasix 40 Mg Tabs (Furosemide) .Marland Kitchen... Take one tablet by mouth three times a day  Orders: 2 D Echo (2 D Echo) Cardiology Referral (Cardiology) T-BNP  (B Natriuretic Peptide) [phone])  Problem # 3:  PVD (ICD-443.9) His right leg has weak pulses. I will get ABI on right leg to see if he needs further vascular management.  Orders: LE Arterial Doppler/ABI (LE arterial doppler)  Problem # 4:  COCAINE ABUSE (ICD-305.60) Ordered UDS again. He says he used cocaine only for four years and since has been clean. I am not so sure about the duration of his use. But I do think that that could have been the reason why cardiologist at [facility] might have not given him ICD. Since now he is clean, it will be improtant to document this on continued bases which should facilitate ICD placement.  Orders: T-Drug Screen-Urine, (single) [id number]) 2 D Echo (2 D Echo)  Problem # 5:  HEPATITIS C (ICD-070.51) repeated liver function test. Enzymes are normal. He will need screening for hepatocellular carcinoma with ultrasound and/or AFP. I have not scheduled this yet. This should be addressed on next visit. Orders: T-Hepatic Function [id number])  Complete Medication List: 1)  Plavix 75 Mg Tabs (Clopidogrel bisulfate) .... Take one tablet by mouth daily 2)  Enalapril Maleate 20 Mg Tabs (Enalapril maleate) .... Take one tablet by mouth daily 3)  Norvasc 10 Mg Tabs (Amlodipine besylate) .... Take one tablet by mouth daily 4)  Lipitor 10 Mg Tabs (Atorvastatin calcium) .... Take one tablet  by mouth daily 5)  Niacor 500 Mg Tabs (Niacin) .... Take 1 tablet by mouth once a day 6)  Coreg 25 Mg Tabs (Carvedilol) .... Take one tablet by mouth twice daily 7)  Ambien 10 Mg Tabs (Zolpidem tartrate) .... Take  one tablet by mouth at bedtime 8)  Neurontin 300 Mg Caps (Gabapentin) .... Take one tablet by mouth three times a day 9)  Lasix 40 Mg Tabs (Furosemide)  .... Take one tablet by mouth three times a day 10)  Zyrtec 10 Mg Tabs (Cetirizine hcl) .... Take one tablet by mouth daily 11)  Venlafaxine Hcl 50 Mg Tabs (Venlafaxine hcl) .... Take one talbet by mouth at bedtime 12)  Amitriptyline Hcl 100 Mg Tabs (Amitriptyline hcl) .... Take 1 tablet by mouth at bedtime 13)  Metformin Hcl 850 Mg Tabs (Metformin hcl) .... Take 1 tablet by mouth two times a day 14)  Glipizide 10 Mg Tb24 (Glipizide) .... Take 1 tablet by mouth two times a day 15)  Ms Contin 60 Mg Xr12h-tab (Morphine sulfate) .... Take 1 tablet by mouth two times a day 16)  Hydrocodone-acetaminophen 10-325 Mg Tabs (Hydrocodone-acetaminophen) .... Take 1 tablet by mouth every 8 hours as needed for pain 17)  Moviprep 100 Gm Solr (Peg-kcl-nacl-nasulf-na asc-c) .... As per prep instructions.  Other Orders: T-PSA ([id number]) Pneumococcal Vaccine (09811) Admin 1st Vaccine (91478)  Patient Instructions: 1)  Please schedule a follow-up appointment in 1 week. Process Orders Check Orders Results:     [facility]: Order checked:     23780 -- T-PSA -- ABN required due to diagnosis (CPT: [phone]) Tests Sent for requisitioning (October 03, 2009 9:34 AM):     10/02/2009: [facility] -- T-Hepatic Function [phone] (signed)     10/02/2009: [facility] -- T-Drug Screen-Urine, (single) [[id number]] (signed)     10/02/2009: [facility] -- T-PSA [phone] (signed)     10/02/2009: [facility] -- T-BNP  (B Natriuretic Peptide) [phone] (signed)    Prevention & Chronic Care Immunizations   Influenza vaccine: Fluvax MCR  (04/26/2009)   Influenza vaccine deferral: Deferred  (10/02/2009)    Tetanus booster: Not documented   Td booster deferral: Deferred  (10/02/2009)    Pneumococcal vaccine: Pneumovax  (10/02/2009)    H. zoster vaccine: Not documented   H. zoster vaccine deferral: Deferred   (10/02/2009)  Colorectal Screening   Hemoccult: Not documented   Hemoccult action/deferral: Ordered  (04/26/2009)    Colonoscopy: Not documented   Colonoscopy action/deferral: GI referral  (09/07/2009)  Other Screening   PSA: Not documented   PSA ordered.   PSA action/deferral: Discussed-decision deferred  (10/02/2009)   Smoking status: current  (10/02/2009)   Smoking cessation counseling: yes  (10/02/2009)  Diabetes Mellitus   HgbA1C: 7.4  (09/07/2009)    Eye exam: Not documented    Foot exam: yes  (10/27/2007)   High risk foot: Not documented   Foot care education: Not documented    Urine microalbumin/creatinine ratio: 419.9  (04/26/2009)    Diabetes flowsheet reviewed?: Yes   Progress toward A1C goal: Unchanged  Lipids   Total Cholesterol: 115  (07/07/2008)   Lipid panel action/deferral: Lipid Panel ordered   LDL: 52  (07/07/2008)   LDL Direct: Not documented   HDL: 31  (07/07/2008)   Triglycerides: 162  (07/07/2008)    SGOT (AST): 28  (07/07/2008)   SGPT (ALT): 42  (07/07/2008)   Alkaline phosphatase: 47  (07/07/2008)   Total bilirubin: 0.4  (07/07/2008)    Lipid flowsheet reviewed?: Yes   Progress toward LDL goal: At goal  Hypertension   Last Blood Pressure: 135 / 77  (10/02/2009)   Serum creatinine: 1.09  (09/07/2009)   BMP action: Ordered   Serum potassium 3.7  (  09/07/2009)    Hypertension flowsheet reviewed?: Yes   Progress toward BP goal: At goal  Self-Management Support :   Personal Goals (by the next clinic visit) :     Personal A1C goal: 7  (10/02/2009)     Personal blood pressure goal: 130/80  (10/02/2009)     Personal LDL goal: 100  (10/02/2009)    Patient will work on the following items until the next clinic visit to reach self-care goals:     Medications and monitoring: take my medicines every day, check my blood sugar, examine my feet every day  (10/02/2009)     Eating: eat more vegetables, use fresh or frozen vegetables, eat foods  that are low in salt, eat baked foods instead of fried foods, eat fruit for snacks and desserts, limit or avoid alcohol  (10/02/2009)     Activity: take a 30 minute walk every day  (09/07/2009)    Diabetes self-management support: Resources for patients handout  (10/02/2009)   Last diabetes self-management training by diabetes educator: 08/26/2007   Last medical nutrition therapy: 08/26/2007    Hypertension self-management support: Resources for patients handout  (10/02/2009)    Lipid self-management support: Resources for patients handout  (10/02/2009)        Resource handout printed.   Nursing Instructions: Give Pneumovax today     Immunizations Administered:  Pneumonia Vaccine:    Vaccine Type: Pneumovax    Site: right deltoid    Mfr: Merck    Dose: 0.5 ml    Route: IM    Given by: Shapale Watlington    Exp. Date: 06/01/2010    Lot #: [id number]    VIS given: 01/19/96 version given October 02, 2009. Marland KitchenCynda Familia [facility])  October 02, 2009 4:16 PM

## 2010-07-25 NOTE — Progress Notes (Signed)
Summary: Refill/gh  Phone Note Refill Request Message from:  Patient on July 17, 2010 11:59 AM  Refills Requested: Medication #1:  MORPHINE SULFATE 15 MG TABS Take one pill by mouth three times a day as needed for pain  Medication #2:  MS CONTIN 60 MG XR12H-TAB Take 1 tablet by mouth two times a day Last visit was 04/2010.   Method Requested: Pick up at Office Initial call taken by: Angelina Ok RN,  July 17, 2010 11:59 AM  Follow-up for Phone Call        Rx printed and signed - nurse to complete. Follow-up by: Margarito Liner MD,  July 18, 2010 11:43 AM  Additional Follow-up for Phone Call Additional follow up Details #1::        Rx given to patient Additional Follow-up by: Marin Roberts RN,  July 18, 2010 11:53 AM    Prescriptions: MORPHINE SULFATE 15 MG TABS (MORPHINE SULFATE) Take one pill by mouth three times a day as needed for pain  #90 x 0   Entered and Authorized by:   Margarito Liner MD   Signed by:   Margarito Liner MD on 07/18/2010   Method used:   Reprint   RxID:   [id number] MS CONTIN 60 MG XR12H-TAB (MORPHINE SULFATE) Take 1 tablet by mouth two times a day  #60 x 0   Entered and Authorized by:   Margarito Liner MD   Signed by:   Margarito Liner MD on 07/18/2010   Method used:   Reprint   RxID:   [id number] MS CONTIN 60 MG XR12H-TAB (MORPHINE SULFATE) Take 1 tablet by mouth two times a day  #60 x 0   Entered and Authorized by:   Margarito Liner MD   Signed by:   Margarito Liner MD on 07/18/2010   Method used:   Print then Give to Patient   RxID:   [id number] MORPHINE SULFATE 15 MG TABS (MORPHINE SULFATE) Take one pill by mouth three times a day as needed for pain  #90 x 0   Entered and Authorized by:   Margarito Liner MD   Signed by:   Margarito Liner MD on 07/18/2010   Method used:   Print then Give to Patient   RxID:   [id number]

## 2010-07-25 NOTE — Letter (Signed)
Summary: Diabetic Instructions  [facility]  [address]   [place], [place] [address]   Phone: [phone]  Fax: [phone]    BORIS ENGELMANN 09/15/1945 MRN: [id number]   x    ORAL DIABETIC MEDICATION INSTRUCTIONS  The day before your procedure:   Take your diabetic pill as you do normally  The day of your procedure:   Do not take your diabetic pill    We will check your blood sugar levels during the admission process and again in Recovery before discharging you home  ________________________________________________________________________

## 2010-07-25 NOTE — Miscellaneous (Signed)
Summary: moviprep rx  Clinical Lists Changes

## 2010-07-25 NOTE — Medication Information (Signed)
Summary: MS. CONTIN/ MORPHINE SULFATE  MS. CONTIN/ MORPHINE SULFATE   Imported By: Margie Billet 11/21/2009 11:51:16  _____________________________________________________________________  External Attachment:    Type:   Image     Comment:   External Document

## 2010-07-25 NOTE — Letter (Signed)
Summary: Anticoagulation Modification Letter  [facility]  [address] [place], [place] [address]   Phone: [phone]  Fax: [phone]    September 26, 2009  Re:    Jonathon Galvan DOB:    [birth year] MRN:  [id number]    Dear Dr. Theotis Barrio:   We have scheduled the above patient for an endoscopic procedure. Our records show that  he/she is on anticoagulation therapy. Please advise as to how long the patient may come off their therapy of Plavix prior to the scheduled procedure on October 10, 2009.  Please append and route the completed form to Francee Piccolo, CMA.  Thank you for your help with this matter.  Sincerely,  Francee Piccolo CMA [facility])   Physician Recommendation:  Hold Plavix 7 days prior ________________  Hold Coumadin 5 days prior ____________  Other ______________________________    Appended Document: Anticoagulation Modification Letter Thank you for seeing Jonathon Galvan and thank you in advance for performing his Colonoscopy.  I am comfortable with him stopping his Plavix 7 days prior to the procedure and then restarting afterwards.  Appended Document: Anticoagulation Modification Letter Pt notified to stop Plavix on 10/04/09 and hold until procedure on 10/10/09.  Pt voices understanding and repeats instructions.  Hardcopy of this letter placed in [facility] chart.

## 2010-07-25 NOTE — Progress Notes (Signed)
Summary: Refill/gh  Phone Note Refill Request Message from:  Fax from Pharmacy on December 05, 2009 10:36 AM  Refills Requested: Medication #1:  AMBIEN 10 MG TABS take one tablet by mouth at bedtime   Dosage confirmed as above?Dosage Confirmed   Brand Name Necessary? No   Supply Requested: 3 months  Medication #2:  NEURONTIN 300 MG CAPS take one tablet by mouth three times a day   Dosage confirmed as above?Dosage Confirmed   Brand Name Necessary? No   Supply Requested: 1 year Pharmacy would like to get 6 month refills on this medication if possible.   Method Requested: Electronic Initial call taken by: Angelina Ok RN,  December 05, 2009 10:37 AM  Follow-up for Phone Call        Refill approved-nurse to complete Follow-up by: Lars Mage MD,  December 06, 2009 3:13 PM  Additional Follow-up for Phone Call Additional follow up Details #1::        Rx called to pharmacy to [facility] per H. Alfredo Bach, RN Additional Follow-up by: Angelina Ok RN,  December 07, 2009 11:14 AM    Prescriptions: NEURONTIN 300 MG CAPS (GABAPENTIN) take one tablet by mouth three times a day  #90 x 11   Entered and Authorized by:   Lars Mage MD   Signed by:   Lars Mage MD on 12/06/2009   Method used:   Telephoned to ...       [facility] (retail)       [address]       [place], [place]  [address]       Ph: [phone]       Fax: [phone]   RxID:   [id number] AMBIEN 10 MG TABS (ZOLPIDEM TARTRATE) take one tablet by mouth at bedtime  #30 x 3   Entered and Authorized by:   Lars Mage MD   Signed by:   Lars Mage MD on 12/06/2009   Method used:   Telephoned to ...       [facility] (retail)       [address]       [place], [place]  [address]       Ph: [phone]       Fax: [phone]   RxID:   [id number]

## 2010-07-25 NOTE — Progress Notes (Signed)
Summary: refill/ hla  Phone Note Refill Request Message from:  Fax from Pharmacy on May 21, 2010 5:30 PM  Refills Requested: Medication #1:  GLIPIZIDE 10 MG  TB24 Take 1 tablet by mouth two times a day   Dosage confirmed as above?Dosage Confirmed   Brand Name Necessary? No   Supply Requested: 1 year  Medication #2:  LIPITOR 10 MG TABS take one tablet  by mouth daily   Dosage confirmed as above?Dosage Confirmed   Brand Name Necessary? No   Supply Requested: 1 year  Medication #3:  VENLAFAXINE HCL 50 MG TABS take one talbet by mouth at bedtime   Dosage confirmed as above?Dosage Confirmed   Brand Name Necessary? No   Supply Requested: 1 year page 2  Initial call taken by: Marin Roberts RN,  May 21, 2010 5:31 PM  Follow-up for Phone Call        Rx faxed to pharmacy Follow-up by: Lars Mage MD,  May 24, 2010 12:25 PM    Prescriptions: GLIPIZIDE 10 MG  TB24 (GLIPIZIDE) Take 1 tablet by mouth two times a day  #60 x 11   Entered and Authorized by:   Lars Mage MD   Signed by:   Lars Mage MD on 05/24/2010   Method used:   Faxed to ...       [facility] (retail)       [address]       [place], [place]  [address]       Ph: [phone]       Fax: [phone]   RxID:   [id number] VENLAFAXINE HCL 50 MG TABS (VENLAFAXINE HCL) take one talbet by mouth at bedtime  #30 x 11   Entered and Authorized by:   Lars Mage MD   Signed by:   Lars Mage MD on 05/24/2010   Method used:   Faxed to ...       [facility] (retail)       [address]       [place], [place]  [address]       Ph: [phone]       Fax: [phone]   RxID:   [id number] LIPITOR 10 MG TABS (ATORVASTATIN CALCIUM) take one tablet  by mouth daily  #30 x 11   Entered and Authorized by:   Lars Mage MD   Signed by:   Lars Mage MD on 05/24/2010   Method used:   Faxed to ...       [facility] (retail)       [address]       [place], [place]  [address]       Ph: [phone]       Fax: [phone]   RxID:   [id number]

## 2010-07-25 NOTE — Progress Notes (Signed)
Summary: refill/gg   monday  Phone Note Refill Request  on August 16, 2009 8:58 AM  Refills Requested: Medication #1:  MS CONTIN 60 MG XR12H-TAB Take 1 tablet by mouth two times a day   Last Refilled: 07/20/2009  Medication #2:  HYDROCODONE-ACETAMINOPHEN 10-325 MG TABS Take 1 tablet by mouth every 8 hours as needed for pain.   Last Refilled: 07/20/2009  Method Requested: Pick up at Office Initial call taken by: Merrie Roof RN,  August 16, 2009 8:59 AM  Follow-up for Phone Call        Rx denied because it was already signed by Dr. Phillips Odor yesterday.  Please see note.  Thank You. Follow-up by: Doneen Poisson MD,  August 21, 2009 9:40 AM

## 2010-07-25 NOTE — Progress Notes (Signed)
Summary: appt/ hla  Phone Note Other Incoming   Summary of Call: HHN calls stating this is his admission visit w/ pt and in his assessment it is noted that on, around L shin area there is a dark area w/ ecchymosis. states he has good pedal pulse. 1+ nonpitting edema and no other complaints. states he feels pt needs to be seen, appt is given for 4/12.  Initial call taken by: Marin Roberts RN,  October 01, 2009 4:59 PM  Follow-up for Phone Call        ok Follow-up by: Clerance Lav MD,  October 02, 2009 9:20 AM

## 2010-07-25 NOTE — Assessment & Plan Note (Signed)
Summary: leg wound/gg   Vital Signs:  Patient profile:   65 year old male Temp:     97.5 degrees F Pulse rate:   60 / minute BP sitting:   125 / 71  Vitals Entered By: Theotis Barrio NT II (April 11, 2010 2:17 PM) CC: PATIENT WAS CHECKED IN BY GAYLE /  PATIENT IS HERE FOR LEG WOUND Nutritional Status BMI of > 30 = obese  Have you ever been in a relationship where you felt threatened, hurt or afraid?No   Primary Care Provider:  Lars Mage MD  CC:  PATIENT WAS CHECKED IN BY GAYLE /  PATIENT IS HERE FOR LEG WOUND.  History of Present Illness: 65 yo male with PMH outlined below presents to [facility] with main concern of sudden onset of feeling sick. He reports right leg pain that initially started last night  while he was at rest and was sitting in the chair. This was all associated with some nausea and generalized stomach discomfort, sharp, nonradiating,10/10 in severity, and felt like he was going to have a BM. He did have a BM,no diarrhea, no blood in stool. He denies any urinary discomfort. He denies any fever, chills, no cough or chest pain, no vomiting. He was able to get up and walk but he felt generalized weakness. In addition he reports his right leg started to itch and it felt warmer than usual, and he felt like he was having hives in his whole body. He tells me that now he feels better today and yesterday this felt better after he had put some peroxide and alcohol on it. He did feel his skin burning after that. He was able to sleep. He denies recent sicknesses or hospitalizations, no sick contacts or exposures. He denies having similar problem in his legs before. He has had AKA in 2005 and he denies any major problem with his amputation but he does report having chronic pain in that leg which has lasted for several years (he does report that doctor who did amputation did tell him that he will have chronic pain).   He repots compliance with BP and diabetic medications.   Preventive  Screening-Counseling & Management  Alcohol-Tobacco     Alcohol drinks/day: 0     Smoking Status: current     Smoking Cessation Counseling: yes     Packs/Day: <0.25     Year Started: 1990  Caffeine-Diet-Exercise     Does Patient Exercise: no  Problems Prior to Update: 1)  Preoperative Examination  (ICD-V72.84) 2)  Leg Pain, Chronic, Left  (ICD-729.5) 3)  Special Screening Malignant Neoplasm of Prostate  (ICD-V76.44) 4)  Leg Pain, Chronic, Left  (ICD-729.5) 5)  CHF  (ICD-428.0) 6)  Hypertension  (ICD-401.9) 7)  Other Primary Cardiomyopathies  (ICD-425.4) 8)  Diabetes Mellitus, Type II  (ICD-250.00) 9)  Dyslipidemia  (ICD-272.4) 10)  Lbbb  (ICD-426.3) 11)  Pvd  (ICD-443.9) 12)  Cocaine Abuse  (ICD-305.60) 13)  Tobacco Use  (ICD-305.1) 14)  Anemia  (ICD-285.9) 15)  Thrombocytopenia  (ICD-287.5) 16)  Complication, Amputation Stump Nos  (ICD-997.60) 17)  Hepatitis C  (ICD-070.51) 18)  Asthma  (ICD-493.90) 19)  Allergic Rhinitis  (ICD-477.9)  Medications Prior to Update: 1)  Plavix 75 Mg Tabs (Clopidogrel Bisulfate) .... Take One Tablet By Mouth Daily 2)  Enalapril Maleate 20 Mg Tabs (Enalapril Maleate) .... Take One Tablet By Mouth Twice Daily. 3)  Norvasc 10 Mg Tabs (Amlodipine Besylate) .... Take One Tablet By Mouth Daily  4)  Lipitor 10 Mg Tabs (Atorvastatin Calcium) .... Take One Tablet  By Mouth Daily 5)  Niacor 500 Mg Tabs (Niacin) .... Take 1 Tablet By Mouth Once A Day 6)  Coreg 25 Mg Tabs (Carvedilol) .... Take One Tablet By Mouth Twice Daily 7)  Ambien 10 Mg Tabs (Zolpidem Tartrate) .... Take One Tablet By Mouth At Bedtime 8)  Neurontin 300 Mg Caps (Gabapentin) .... Take One Tablet By Mouth Three Times A Day 9)  Lasix 40 Mg Tabs (Furosemide) .... Take One Tablet By Mouth Three Times A Day 10)  Zyrtec 10 Mg Tabs (Cetirizine Hcl) .... Take One Tablet By Mouth Daily 11)  Venlafaxine Hcl 50 Mg Tabs (Venlafaxine Hcl) .... Take One Talbet By Mouth At Bedtime 12)   Amitriptyline Hcl 100 Mg Tabs (Amitriptyline Hcl) .... Take 1 Tablet By Mouth At Bedtime 13)  Metformin Hcl 850 Mg Tabs (Metformin Hcl) .... Take 1 Tablet By Mouth Two Times A Day 14)  Glipizide 10 Mg  Tb24 (Glipizide) .... Take 1 Tablet By Mouth Two Times A Day 15)  Ms Contin 60 Mg Xr12h-Tab (Morphine Sulfate) .... Take 1 Tablet By Mouth Two Times A Day 16)  Morphine Sulfate 15 Mg Tabs (Morphine Sulfate) .... Take One Pill By Mouth Three Times A Day As Needed For Pain  Current Medications (verified): 1)  Plavix 75 Mg Tabs (Clopidogrel Bisulfate) .... Take One Tablet By Mouth Daily 2)  Enalapril Maleate 20 Mg Tabs (Enalapril Maleate) .... Take One Tablet By Mouth Twice Daily. 3)  Norvasc 10 Mg Tabs (Amlodipine Besylate) .... Take One Tablet By Mouth Daily 4)  Lipitor 10 Mg Tabs (Atorvastatin Calcium) .... Take One Tablet  By Mouth Daily 5)  Niacor 500 Mg Tabs (Niacin) .... Take 1 Tablet By Mouth Once A Day 6)  Coreg 25 Mg Tabs (Carvedilol) .... Take One Tablet By Mouth Twice Daily 7)  Ambien 10 Mg Tabs (Zolpidem Tartrate) .... Take One Tablet By Mouth At Bedtime 8)  Neurontin 300 Mg Caps (Gabapentin) .... Take One Tablet By Mouth Three Times A Day 9)  Lasix 40 Mg Tabs (Furosemide) .... Take One Tablet By Mouth Three Times A Day 10)  Zyrtec 10 Mg Tabs (Cetirizine Hcl) .... Take One Tablet By Mouth Daily 11)  Venlafaxine Hcl 50 Mg Tabs (Venlafaxine Hcl) .... Take One Talbet By Mouth At Bedtime 12)  Amitriptyline Hcl 100 Mg Tabs (Amitriptyline Hcl) .... Take 1 Tablet By Mouth At Bedtime 13)  Metformin Hcl 850 Mg Tabs (Metformin Hcl) .... Take 1 Tablet By Mouth Two Times A Day 14)  Glipizide 10 Mg  Tb24 (Glipizide) .... Take 1 Tablet By Mouth Two Times A Day 15)  Ms Contin 60 Mg Xr12h-Tab (Morphine Sulfate) .... Take 1 Tablet By Mouth Two Times A Day 16)  Morphine Sulfate 15 Mg Tabs (Morphine Sulfate) .... Take One Pill By Mouth Three Times A Day As Needed For Pain  Allergies (verified): No  Known Drug Allergies  Past History:  Past Medical History: Last updated: 10/02/2009 DM-II    - dx'd 07/2007: DKA (came in unresponsive)    - initially started on insulin, was switched to oral hypoglycemic agents 6/09 ASTHMA HYPERTENSION NON-ISCHEMIC CMP WITH CHF    - EF 20-25% (echo 11/2005)   -EF not estimated and very poor in 2009 LBBB DYSLIPIDEMIA    - hyperTG on Niaspan    - dyslipidemia on Lipitor AOCD, baseline now 13.0 PERIPHERAL VASCULAR DISEASE    - s/p L AKA  2004-04-07 @ [facility] (in the setting of a septic arthritis: MRSA)    - sees Dr. Riley Kill for pain mgmt  (painful stump) HEPATITIS C    - doc'd 12/2002 CHOLELITHIASIS - documented in 07/2007 on abdominal CT scan COCAINE USE    - s/p cardiopulmonary arrest 12/25/2002 [name])    - 2D echo: 20-25% LVEF, cardiac cath: 25-30% mid RCA stenosis, LVEF 40%, EPS study: LBBB but      otherwise normal Allergic rhinitis THROMBOCYTOPENIA    - 07/2007 while hospitalized. Went down to 90. Thought to be 2/2 Neurontin.  Past Surgical History: Last updated: 2009/09/24 s/p L AKA Apr 07, 2004 Cyst removel on left shoulder  Family History: Last updated: 24-Sep-2009 Mother died of an MI in her 56s.  Father died from complications of cirrhosis. Brother had a fatal MI. No FH of Colon Cancer:  Social History: Last updated: 2009-09-24 Tobacco: almost 1 PPD x 50 yrs.  Now smokes 5-7 cigarettes/day. Had 5 children, now 39. 48 y/o dgtr died 04-07-2008 from ESRD. His sister constantly checks in on him and comes with him to all his apptmts. Pt has an aide to help him during the day. Alcohol- occ. 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 Daily Caffeine Use  occ  Risk Factors: Alcohol Use: 0 (04/11/2010) Exercise: no (04/11/2010)  Risk Factors: Smoking Status: current (04/11/2010) Packs/Day: <0.25 (04/11/2010)  Family History: Reviewed history from 09/24/09 and no changes required. Mother  died of an MI in her 53s.  Father died from complications of cirrhosis. Brother had a fatal MI. No FH of Colon Cancer:  Social History: Reviewed history from 2009/09/24 and no changes required. Tobacco: almost 1 PPD x 50 yrs.  Now smokes 5-7 cigarettes/day. Had 5 children, now 58. 64 y/o dgtr died April 07, 2008 from ESRD. His sister constantly checks in on him and comes with him to all his apptmts. Pt has an aide to help him during the day. Alcohol- occ. 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 Daily Caffeine Use  occ Packs/Day:  <0.25  Review of Systems       per HPI  Physical Exam  General:  tired appearing, vitals stable, alet and well developed  Head:  atraumatic, no abnormalities observed, and no abnormalities palpated.   Eyes:  pupils equal, pupils round, and pupils reactive to light.   Mouth:  fair dentition, clean, no OP erythema, dry MM Neck:  No deformities, masses, or tenderness noted. Lungs:  normal respiratory effort, normal breath sounds, no crackles, and no wheezes.   Heart:  normal rate, regular rhythm, no gallop, no rub, and no JVD.  2/6 SEM, distal heart sounds Abdomen:  Bowel sounds positive,abdomen soft and non-tender without masses, obese, organomegaly or hernias noted. Msk:  normal range of motion, no joint deformities or swelling in upper extremity joints, right lower extremity swollen, tight, warm to touch and extending from the forefoot to right below the knee  Pulses:  radial pusles present bilaterally but weak, dorsalis pedis and posterior tibial pulse on the right leg present Extremities:  left AKA, righ lower extremity swollen, tight, +2 pitting edema Neurologic:  alert & oriented X3, cranial nerves II-XII intact, and strength normal in upper extremities and right lower extremity, normal finger to nose Skin:  right lower extremity swollen and multiple small lesions heald and some opne are located over the right calf,  shin, and sides, chronic venous stasis  changes, shiny and no hair growth noted Cervical Nodes:  No lymphadenopathy noted Psych:  Cognition and judgment appear intact. Alert and cooperative with normal attention span and concentration. No apparent delusions, illusions, hallucinations   Impression & Recommendations:  Problem # 1:  LEG EDEMA (ICD-782.3)  Unclear etiology, differential includes arterial or venous clots, compartment syndrome, cellulitis. Will start with venous and arterial doppler study, will check CBC and if the above tests are negative will treat for presumptive cellulitis with doxycycline. Pt will go upstairs to radiology to have venous doppler study today but arterial doppler was scheduled for Oct 27th, at 9 am.  I have encouraged patient to keep leg elevated. His updated medication list for this problem includes:    Lasix 40 Mg Tabs (Furosemide) .Marland Kitchen... Take one tablet by mouth three times a day  Orders: LE Arterial Doppler/ABI (LE arterial doppler) LE Venous Duplex (DVT) (DVT) T-CBC No Diff ([id number])  Problem # 2:  HYPERTENSION (ICD-401.9) Good control, cont the same regimen.  His updated medication list for this problem includes:    Enalapril Maleate 20 Mg Tabs (Enalapril maleate) .Marland Kitchen... Take one tablet by mouth twice daily.    Norvasc 10 Mg Tabs (Amlodipine besylate) .Marland Kitchen... Take one tablet by mouth daily    Coreg 25 Mg Tabs (Carvedilol) .Marland Kitchen... Take one tablet by mouth twice daily    Lasix 40 Mg Tabs (Furosemide) .Marland Kitchen... Take one tablet by mouth three times a day  BP today: 125/71 Prior BP: 122/71 (04/08/2010)  Prior 10 Yr Risk Heart Disease: Not enough information (02/02/2007)  Labs Reviewed: K+: 3.6 (03/04/2010) Creat: : 1.02 (03/04/2010)   Chol: 115 (07/07/2008)   HDL: 31 (07/07/2008)   LDL: 52 (07/07/2008)   TG: 162 (07/07/2008)  Problem # 3:  DIABETES MELLITUS, TYPE II (ICD-250.00) Good control, will cont the same regimen.  His updated medication list for  this problem includes:    Enalapril Maleate 20 Mg Tabs (Enalapril maleate) .Marland Kitchen... Take one tablet by mouth twice daily.    Metformin Hcl 850 Mg Tabs (Metformin hcl) .Marland Kitchen... Take 1 tablet by mouth two times a day    Glipizide 10 Mg Tb24 (Glipizide) .Marland Kitchen... Take 1 tablet by mouth two times a day  Labs Reviewed: Creat: 1.02 (03/04/2010)    Reviewed HgBA1c results: 7.5 (03/04/2010)  7.4 (09/07/2009)  Problem # 4:  DYSLIPIDEMIA (ICD-272.4) Will check his FLP on his next visit per patient's preference.  His updated medication list for this problem includes:    Lipitor 10 Mg Tabs (Atorvastatin calcium) .Marland Kitchen... Take one tablet  by mouth daily    Niacor 500 Mg Tabs (Niacin) .Marland Kitchen... Take 1 tablet by mouth once a day  Labs Reviewed: SGOT: 42 (03/04/2010)   SGPT: 31 (03/04/2010)  Prior 10 Yr Risk Heart Disease: Not enough information (02/02/2007)   HDL:31 (07/07/2008), 30 (02/15/2007)  LDL:52 (07/07/2008), 84 (02/15/2007)  Chol:115 (07/07/2008), 138 (02/15/2007)  Trig:162 (07/07/2008), 122 (02/15/2007)  Complete Medication List: 1)  Plavix 75 Mg Tabs (Clopidogrel bisulfate) .... Take one tablet by mouth daily 2)  Enalapril Maleate 20 Mg Tabs (Enalapril maleate) .... Take one tablet by mouth twice daily. 3)  Norvasc 10 Mg Tabs (Amlodipine besylate) .... Take one tablet by mouth daily 4)  Lipitor 10 Mg Tabs (Atorvastatin calcium) .... Take one tablet  by mouth daily 5)  Niacor 500 Mg Tabs (Niacin) .... Take 1 tablet by mouth once a day 6)  Coreg 25 Mg Tabs (Carvedilol) .... Take one tablet by mouth twice daily  7)  Ambien 10 Mg Tabs (Zolpidem tartrate) .... Take one tablet by mouth at bedtime 8)  Neurontin 300 Mg Caps (Gabapentin) .... Take one tablet by mouth three times a day 9)  Lasix 40 Mg Tabs (Furosemide) .... Take one tablet by mouth three times a day 10)  Zyrtec 10 Mg Tabs (Cetirizine hcl) .... Take one tablet by mouth daily 11)  Venlafaxine Hcl 50 Mg Tabs (Venlafaxine hcl) .... Take one talbet by  mouth at bedtime 12)  Amitriptyline Hcl 100 Mg Tabs (Amitriptyline hcl) .... Take 1 tablet by mouth at bedtime 13)  Metformin Hcl 850 Mg Tabs (Metformin hcl) .... Take 1 tablet by mouth two times a day 14)  Glipizide 10 Mg Tb24 (Glipizide) .... Take 1 tablet by mouth two times a day 15)  Ms Contin 60 Mg Xr12h-tab (Morphine sulfate) .... Take 1 tablet by mouth two times a day 16)  Morphine Sulfate 15 Mg Tabs (Morphine sulfate) .... Take one pill by mouth three times a day as needed for pain 17)  Doxycycline Hyclate 100 Mg Tabs (Doxycycline hyclate) .... Take 1 tablet every 12 hours  Patient Instructions: 1)  Please schedule a follow-up appointment in 2 weeks or sooner if your symptoms get worse or if you experience fever, chills, and worsening pain in your leg.  2)  Keep you leg elevated when siiting, you can put few pillows under your leg. 3)  YOur appointment for leg study (arterial doppler) is scheduled for OCT 27th, 2011 at 9 am and you will go to the same place, admitting as you did today.  Prescriptions: DOXYCYCLINE HYCLATE 100 MG TABS (DOXYCYCLINE HYCLATE) take 1 tablet every 12 hours  #20 x 0   Entered and Authorized by:   Mliss Sax MD   Signed by:   Mliss Sax MD on 04/11/2010   Method used:   Faxed to ...       [facility] (retail)       [address]       [place], [place]  [address]       Ph: [phone]       Fax: [phone]   RxID:   [id number]    Orders Added: 1)  LE Arterial Doppler/ABI [LE arterial doppler] 2)  LE Venous Duplex (DVT) [DVT] 3)  T-CBC No Diff [[id number]] 4)  Est. Patient Level III [40347]   Process Orders Check Orders Results:     [facility]: Check successful Order queued for requisitioning for [facility]: April 11, 2010 3:02 PM  Tests Sent for requisitioning (April 11, 2010 3:02 PM):     04/11/2010: [facility] -- T-CBC No Diff [[id number]]  (signed)     Prevention & Chronic Care Immunizations   Influenza vaccine: Fluvax 3+  (04/08/2010)   Influenza vaccine deferral: Deferred  (10/02/2009)    Tetanus booster: Not documented   Td booster deferral: Not indicated  (10/09/2009)    Pneumococcal vaccine: Pneumovax  (10/02/2009)    H. zoster vaccine: Not documented   H. zoster vaccine deferral: Not indicated  (10/09/2009)  Colorectal Screening   Hemoccult: Not documented   Hemoccult action/deferral: Refused  (10/09/2009)    Colonoscopy: DONE  (10/23/2009)   Colonoscopy action/deferral: Refused  (10/09/2009)   Colonoscopy due: 01/2010  Other Screening   PSA: 0.10  (10/02/2009)   PSA action/deferral: Discussed-decision deferred  (10/02/2009)   Smoking status: current  (04/11/2010)   Smoking cessation counseling:  yes  (04/11/2010)  Diabetes Mellitus   HgbA1C: 7.5  (03/04/2010)    Eye exam: Not documented   Diabetic eye exam action/deferral: Deferred  (10/09/2009)    Foot exam: yes  (03/04/2010)   Foot exam action/deferral: Do today   High risk foot: Yes  (03/04/2010)   Foot care education: Done  (03/04/2010)    Urine microalbumin/creatinine ratio: 419.9  (04/26/2009)    Diabetes flowsheet reviewed?: Yes   Progress toward A1C goal: At goal  Lipids   Total Cholesterol: 115  (07/07/2008)   Lipid panel action/deferral: Lipid Panel ordered   LDL: 52  (07/07/2008)   LDL Direct: Not documented   HDL: 31  (07/07/2008)   Triglycerides: 162  (07/07/2008)    SGOT (AST): 42  (03/04/2010)   SGPT (ALT): 31  (03/04/2010)   Alkaline phosphatase: 72  (03/04/2010)   Total bilirubin: 0.5  (03/04/2010)    Lipid flowsheet reviewed?: Yes   Progress toward LDL goal: At goal  Hypertension   Last Blood Pressure: 125 / 71  (04/11/2010)   Serum creatinine: 1.02  (03/04/2010)   BMP action: Ordered   Serum potassium 3.6  (03/04/2010)    Hypertension flowsheet reviewed?: Yes   Progress toward BP goal: At  goal  Self-Management Support :   Personal Goals (by the next clinic visit) :     Personal A1C goal: 7  (10/02/2009)     Personal blood pressure goal: 130/80  (10/02/2009)     Personal LDL goal: 100  (10/02/2009)    Diabetes self-management support: Resources for patients handout, Written self-care plan, Referred for DM self-management training  (03/04/2010)   Last diabetes self-management training by diabetes educator: 04/08/2010   Last medical nutrition therapy: 08/26/2007    Hypertension self-management support: Resources for patients handout, Written self-care plan  (03/04/2010)    Lipid self-management support: Resources for patients handout, Written self-care plan  (03/04/2010)

## 2010-07-25 NOTE — Letter (Signed)
Summary: [facility] Instructions  [facility]  [address] [place], [place] [address]   Phone: [phone]  Fax: [phone]       Jonathon Galvan    [birth year]    MRN: [id number]        Procedure Day Dorna BloomLulu Riding, 10/10/09     Arrival Time: 1:30 PM      Procedure Time: 2:30 PM      Location of Procedure:                    _X_  [facility] (4th Floor)                      PREPARATION FOR COLONOSCOPY WITH MOVIPREP   Starting 5 days prior to your procedure 10/05/09 do not eat nuts, seeds, popcorn, corn, beans, peas,  salads, or any raw vegetables.  Do not take any fiber supplements (e.g. Metamucil, Citrucel, and Benefiber).  THE DAY BEFORE YOUR PROCEDURE         TUESDAY, 10/09/09  1.  Drink clear liquids the entire day-NO SOLID FOOD  2.  Do not drink anything colored red or purple.  Avoid juices with pulp.  No orange juice.  3.  Drink at least 64 oz. (8 glasses) of fluid/clear liquids during the day to prevent dehydration and help the prep work efficiently.  CLEAR LIQUIDS INCLUDE: Water Jello Ice Popsicles Tea (sugar ok, no milk/cream) Powdered fruit flavored drinks Coffee (sugar ok, no milk/cream) Gatorade Juice: apple, white grape, white cranberry  Lemonade Clear bullion, consomm, broth Carbonated beverages (any kind) Strained chicken noodle soup Hard Candy                             4.  In the morning, mix first dose of MoviPrep solution:    Empty 1 Pouch A and 1 Pouch B into the disposable container    Add lukewarm drinking water to the top line of the container. Mix to dissolve    Refrigerate (mixed solution should be used within 24 hrs)  5.  Begin drinking the prep at 5:00 p.m. The MoviPrep container is divided by 4 marks.   Every 15 minutes drink the solution down to the next mark (approximately 8 oz) until the full liter is complete.   6.  Follow completed prep with 16 oz of clear liquid of your choice (Nothing red or  purple).  Continue to drink clear liquids until bedtime.  7.  Before going to bed, mix second dose of MoviPrep solution:    Empty 1 Pouch A and 1 Pouch B into the disposable container    Add lukewarm drinking water to the top line of the container. Mix to dissolve    Refrigerate  THE DAY OF YOUR PROCEDURE      WEDNESDAY, 10/10/09  Beginning at 8:30a.m. (5 hours before procedure):         1. Every 15 minutes, drink the solution down to the next mark (approx 8 oz) until the full liter is complete.  2. Follow completed prep with 16 oz. of clear liquid of your choice.    3. You may drink clear liquids until 11: 30 AM (2 HOURS BEFORE PROCEDURE).   MEDICATION INSTRUCTIONS  Unless otherwise instructed, you should take regular prescription medications with a small sip of water   as early as possible the morning of your procedure.  Diabetic patients -  see separate instructions.  Stop taking Plavix or Aggrenox on  _  _  (7 days before procedure).   You will be contacted by our office prior to your procedure for directions on holding your Plavix.  If you do not hear from our office 1 week prior to your scheduled procedure, please call [phone] to discuss.        OTHER INSTRUCTIONS  You will need a responsible adult at least 66 years of age to accompany you and drive you home.   This person must remain in the waiting room during your procedure.  Wear loose fitting clothing that is easily removed.  Leave jewelry and other valuables at home.  However, you may wish to bring a book to read or  an iPod/MP3 player to listen to music as you wait for your procedure to start.  Remove all body piercing jewelry and leave at home.  Total time from sign-in until discharge is approximately 2-3 hours.  You should go home directly after your procedure and rest.  You can resume normal activities the  day after your procedure.       The day of your procedure you should not:   Drive    Make legal decisions   Operate machinery   Drink alcohol   Return to work  You will receive specific instructions about eating, activities and medications before you leave.    The above instructions have been reviewed and explained to me by   _______________________    I fully understand and can verbalize these instructions _____________________________ Date _________

## 2010-07-25 NOTE — Progress Notes (Signed)
Summary: refill/gg   due today  Phone Note Refill Request  on April 17, 2010 8:53 AM  Refills Requested: Medication #1:  MS CONTIN 60 MG XR12H-TAB Take 1 tablet by mouth two times a day   Dosage confirmed as above?Dosage Confirmed   Brand Name Necessary? No   Supply Requested: 1 month   Last Refilled: 03/18/2010  Medication #2:  MORPHINE SULFATE 15 MG TABS Take one pill by mouth three times a day as needed for pain   Dosage confirmed as above?Dosage Confirmed   Brand Name Necessary? No   Supply Requested: 1 month   Last Refilled: 03/18/2010  Method Requested: Pick up at Office Initial call taken by: Merrie Roof RN,  April 17, 2010 8:53 AM  Follow-up for Phone Call         in the box at sample room. Follow-up by: Lars Mage MD,  April 17, 2010 1:39 PM  Additional Follow-up for Phone Call Additional follow up Details #1::        pt informed Rx is ready Additional Follow-up by: Merrie Roof RN,  April 17, 2010 2:29 PM    Prescriptions: MORPHINE SULFATE 15 MG TABS (MORPHINE SULFATE) Take one pill by mouth three times a day as needed for pain  #90 x 0   Entered and Authorized by:   Lars Mage MD   Signed by:   Lars Mage MD on 04/17/2010   Method used:   Print then Give to Patient   RxID:   [id number] MS CONTIN 60 MG XR12H-TAB (MORPHINE SULFATE) Take 1 tablet by mouth two times a day  #60 x 0   Entered and Authorized by:   Lars Mage MD   Signed by:   Lars Mage MD on 04/17/2010   Method used:   Print then Give to Patient   RxID:   [id number] MORPHINE SULFATE 15 MG TABS (MORPHINE SULFATE) Take one pill by mouth three times a day as needed for pain  #90 x 0   Entered and Authorized by:   Lars Mage MD   Signed by:   Lars Mage MD on 04/17/2010   Method used:   Print then Give to Patient   RxID:   [id number] MS CONTIN 60 MG XR12H-TAB (MORPHINE SULFATE) Take 1 tablet by mouth two times a day  #60 x 0   Entered and Authorized by:   Lars Mage  MD   Signed by:   Lars Mage MD on 04/17/2010   Method used:   Print then Give to Patient   RxID:   [id number]

## 2010-07-25 NOTE — Medication Information (Signed)
Summary: [facility] [employer] [facility]   Imported By: Florinda Marker 07/23/2009 10:41:24  _____________________________________________________________________  External Attachment:    Type:   Image     Comment:   External Document

## 2010-07-25 NOTE — Miscellaneous (Signed)
Summary: [facility]: [facility]  [facility]   Imported By: Florinda Marker 07/23/2009 12:19:27  _____________________________________________________________________  External Attachment:    Type:   Image     Comment:   External Document

## 2010-07-25 NOTE — Assessment & Plan Note (Signed)
Summary: ACUTE/SITKO-1 WEEK RECHECK PER [facility]   Vital Signs:  Patient profile:   65 year old male Height:      69.5 inches (176.53 cm) Weight:      216.5 pounds (98.41 kg) BMI:     31.63 Temp:     96.8 degrees F (36 degrees C) oral Pulse rate:   59 / minute BP sitting:   137 / 77  (right arm)  Vitals Entered By: Stanton Kidney Ditzler RN (October 09, 2009 3:46 PM) Is Patient Diabetic? Yes Did you bring your meter with you today? No Pain Assessment Patient in pain? no      Nutritional Status BMI of > 30 = obese Nutritional Status Detail appetite good  Have you ever been in a relationship where you felt threatened, hurt or afraid?Denies   Does patient need assistance? Functional Status Self care Ambulation Wheelchair Comments FU - sch for colonoscopy 10/10/09 with [facility]. CBG this AM 146.   Primary Care Provider:  Brooks Sailors MD   History of Present Illness: 65 yo male with PMH outlined below presents to [facility] for regular follow up appointment. He was told to come in but is not sure why. He has no concerns at the time. No recent sicknesses or hospitalizaitons. No episodes of chest pain, SOB, palpitations. No specific abdominal or urinary concerns. No recent changes in appetite, weight, sleep patterns, mood.    Depression History:      The patient denies a depressed mood most of the day and a diminished interest in his usual daily activities.  The patient denies significant weight loss, significant weight gain, insomnia, hypersomnia, psychomotor agitation, psychomotor retardation, fatigue (loss of energy), feelings of worthlessness (guilt), impaired concentration (indecisiveness), and recurrent thoughts of death or suicide.        The patient denies that he feels like life is not worth living, denies that he wishes that he were dead, and denies that he has thought about ending his life.         Preventive Screening-Counseling & Management  Alcohol-Tobacco     Smoking Status:  current     Smoking Cessation Counseling: yes     Packs/Day: 5 cig per day  Caffeine-Diet-Exercise     Does Patient Exercise: no  Problems Prior to Update: 1)  Special Screening Malignant Neoplasm of Prostate  (ICD-V76.44) 2)  Leg Pain, Chronic, Left  (ICD-729.5) 3)  Lbbb  (ICD-426.3) 4)  Other Primary Cardiomyopathies  (ICD-425.4) 5)  Diabetes Mellitus, Type II  (ICD-250.00) 6)  Thrombocytopenia  (ICD-287.5) 7)  Pvd  (ICD-443.9) 8)  Tobacco Use  (ICD-305.1) 9)  Allergic Rhinitis  (ICD-477.9) 10)  Cocaine Abuse  (ICD-305.60) 11)  Complication, Amputation Stump Nos  (ICD-997.60) 12)  Hepatitis C  (ICD-070.51) 13)  Anemia  (ICD-285.9) 14)  Dyslipidemia  (ICD-272.4) 15)  Hypertension  (ICD-401.9) 16)  CHF  (ICD-428.0) 17)  Asthma  (ICD-493.90)  Medications Prior to Update: 1)  Plavix 75 Mg Tabs (Clopidogrel Bisulfate) .... Take One Tablet By Mouth Daily 2)  Enalapril Maleate 20 Mg Tabs (Enalapril Maleate) .... Take One Tablet By Mouth Daily 3)  Norvasc 10 Mg Tabs (Amlodipine Besylate) .... Take One Tablet By Mouth Daily 4)  Lipitor 10 Mg Tabs (Atorvastatin Calcium) .... Take One Tablet  By Mouth Daily 5)  Niacor 500 Mg Tabs (Niacin) .... Take 1 Tablet By Mouth Once A Day 6)  Coreg 25 Mg Tabs (Carvedilol) .... Take One Tablet By Mouth Twice Daily 7)  Ambien  10 Mg Tabs (Zolpidem Tartrate) .... Take One Tablet By Mouth At Bedtime 8)  Neurontin 300 Mg Caps (Gabapentin) .... Take One Tablet By Mouth Three Times A Day 9)  Lasix 40 Mg Tabs (Furosemide) .... Take One Tablet By Mouth Three Times A Day 10)  Zyrtec 10 Mg Tabs (Cetirizine Hcl) .... Take One Tablet By Mouth Daily 11)  Venlafaxine Hcl 50 Mg Tabs (Venlafaxine Hcl) .... Take One Talbet By Mouth At Bedtime 12)  Amitriptyline Hcl 100 Mg Tabs (Amitriptyline Hcl) .... Take 1 Tablet By Mouth At Bedtime 13)  Metformin Hcl 850 Mg Tabs (Metformin Hcl) .... Take 1 Tablet By Mouth Two Times A Day 14)  Glipizide 10 Mg  Tb24 (Glipizide)  .... Take 1 Tablet By Mouth Two Times A Day 15)  Ms Contin 60 Mg Xr12h-Tab (Morphine Sulfate) .... Take 1 Tablet By Mouth Two Times A Day 16)  Hydrocodone-Acetaminophen 10-325 Mg Tabs (Hydrocodone-Acetaminophen) .... Take 1 Tablet By Mouth Every 8 Hours As Needed For Pain 17)  Moviprep 100 Gm  Solr (Peg-Kcl-Nacl-Nasulf-Na Asc-C) .... As Per Prep Instructions.  Current Medications (verified): 1)  Plavix 75 Mg Tabs (Clopidogrel Bisulfate) .... Take One Tablet By Mouth Daily 2)  Enalapril Maleate 20 Mg Tabs (Enalapril Maleate) .... Take One Tablet By Mouth Daily 3)  Norvasc 10 Mg Tabs (Amlodipine Besylate) .... Take One Tablet By Mouth Daily 4)  Lipitor 10 Mg Tabs (Atorvastatin Calcium) .... Take One Tablet  By Mouth Daily 5)  Niacor 500 Mg Tabs (Niacin) .... Take 1 Tablet By Mouth Once A Day 6)  Coreg 25 Mg Tabs (Carvedilol) .... Take One Tablet By Mouth Twice Daily 7)  Ambien 10 Mg Tabs (Zolpidem Tartrate) .... Take One Tablet By Mouth At Bedtime 8)  Neurontin 300 Mg Caps (Gabapentin) .... Take One Tablet By Mouth Three Times A Day 9)  Lasix 40 Mg Tabs (Furosemide) .... Take One Tablet By Mouth Three Times A Day 10)  Zyrtec 10 Mg Tabs (Cetirizine Hcl) .... Take One Tablet By Mouth Daily 11)  Venlafaxine Hcl 50 Mg Tabs (Venlafaxine Hcl) .... Take One Talbet By Mouth At Bedtime 12)  Amitriptyline Hcl 100 Mg Tabs (Amitriptyline Hcl) .... Take 1 Tablet By Mouth At Bedtime 13)  Metformin Hcl 850 Mg Tabs (Metformin Hcl) .... Take 1 Tablet By Mouth Two Times A Day 14)  Glipizide 10 Mg  Tb24 (Glipizide) .... Take 1 Tablet By Mouth Two Times A Day 15)  Ms Contin 60 Mg Xr12h-Tab (Morphine Sulfate) .... Take 1 Tablet By Mouth Two Times A Day 16)  Hydrocodone-Acetaminophen 10-325 Mg Tabs (Hydrocodone-Acetaminophen) .... Take 1 Tablet By Mouth Every 8 Hours As Needed For Pain 17)  Moviprep 100 Gm  Solr (Peg-Kcl-Nacl-Nasulf-Na Asc-C) .... As Per Prep Instructions.  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 Riley Kill for pain mgmt  (painful stump) HEPATITIS C    -  doc'd 12/2002 CHOLELITHIASIS - documented in 07/2007 on abdominal CT scan COCAINE USE    - s/p cardiopulmonary arrest 12/25/2002 [facility])    - 2D echo: 20-25% LVEF, cardiac cath: 25-30% mid RCA stenosis, LVEF 40%, EPS study: LBBB but      otherwise normal Allergic rhinitis THROMBOCYTOPENIA    - 07/2007 while hospitalized. Went down to 90. Thought to be 2/2 Neurontin.  Family History: Last updated: 09/28/09 Mother died of an MI in her 30s.  Father died from complications of cirrhosis. Brother had a fatal MI. No FH of Colon Cancer:  Social History: Last updated: 2009-09-28 Tobacco: almost 1 PPD x 50 yrs.  Now smokes 5-7 cigarettes/day. Had 5 children, now 27. 56 y/o dgtr died 2008-04-11 from ESRD. His sister constantly checks in on him and comes with him to all his apptmts. Pt has an aide to help him during the day. Alcohol- occ. 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 Daily Caffeine Use  occ  Risk Factors: Exercise: no (10/09/2009)  Risk Factors: Smoking Status: current (10/09/2009) Packs/Day: 5 cig per day (10/09/2009)  Family History: Reviewed history from 09/28/09 and no changes required. Mother died of an MI in her 70s.  Father died from complications of cirrhosis. Brother had a fatal MI. No FH of Colon Cancer:  Social  History: Reviewed history from 2009/09/28 and no changes required. Tobacco: almost 1 PPD x 50 yrs.  Now smokes 5-7 cigarettes/day. Had 5 children, now 31. 64 y/o dgtr died 2008-04-11 from ESRD. His sister constantly checks in on him and comes with him to all his apptmts. Pt has an aide to help him during the day. Alcohol- occ. 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 Daily Caffeine Use  occ Packs/Day:  5 cig per day  Review of Systems       per HPI  General:  alert, well-developed, well-nourished, and well-hydrated.   Head:  normocephalic and atraumatic.   Eyes:  vision grossly intact, pupils equal, and pupils round.   Mouth:  no gingival abnormalities and pharynx pink and moist.   Neck:  no masses.   Lungs:  normal respiratory effort, normal breath sounds, no crackles, and no wheezes.   Heart:  normal rate, regular rhythm, no gallop, no rub, and no JVD.  2/6 SEM Abdomen:  soft, non-tender, normal bowel sounds, no distention, no masses, no guarding, no rigidity, and no rebound tenderness.   Msk:  left AKA Extremities:  Left AKA, ++ edema in R LE. Neurologic:  alert & oriented X3 and cranial nerves II-XII intact.   Psych:  Oriented X3.  sleepy.  Impression & Recommendations:  Problem # 1:  DIABETES MELLITUS, TYPE II (ICD-250.00) addressed on last appointment, will cont the same regimen.  His updated medication list for this problem includes:    Enalapril Maleate 20 Mg Tabs (Enalapril maleate) .Marland Kitchen... Take one tablet by mouth daily    Metformin Hcl 850 Mg Tabs (Metformin hcl) .Marland Kitchen... Take 1 tablet by mouth two times a day    Glipizide 10 Mg Tb24 (Glipizide) .Marland Kitchen... Take 1 tablet by mouth two times a day  Problem # 2:  TOBACCO USE (ICD-305.1)  Encouraged smoking cessation and discussed different methods for smoking cessation.   Problem # 3:  HEPATITIS C (ICD-070.51)  Recommended ABD [place] for eval of hepatocellular carcinoma. Will order it today  and will follow up and will also check  AFP toady.   Orders: T- * Misc. Laboratory test [id number]) Ultrasound (Ultrasound)  Problem # 4:  HYPERTENSION (ICD-401.9) Well controlled, will continue the same regimen. His updated medication list for this problem includes:    Enalapril Maleate 20 Mg Tabs (Enalapril maleate) .Marland Kitchen... Take one tablet by mouth daily    Norvasc 10 Mg Tabs (Amlodipine besylate) .Marland Kitchen... Take one tablet by mouth daily    Coreg 25 Mg Tabs (Carvedilol) .Marland Kitchen... Take one tablet by mouth twice daily    Lasix 40 Mg Tabs (Furosemide) .Marland Kitchen... Take one tablet by mouth three times a day  BP today: 137/77 Prior BP: 135/77 (10/02/2009)  Prior 10 Yr Risk Heart Disease: Not enough information (02/02/2007)  Labs Reviewed: K+: 3.7 (09/07/2009) Creat: : 1.09 (09/07/2009)   Chol: 115 (07/07/2008)   HDL: 31 (07/07/2008)   LDL: 52 (07/07/2008)   TG: 162 (07/07/2008)  Problem # 5:  DYSLIPIDEMIA (ICD-272.4) LDL improving, cont the same regimen.  His updated medication list for this problem includes:    Lipitor 10 Mg Tabs (Atorvastatin calcium) .Marland Kitchen... Take one tablet  by mouth daily    Niacor 500 Mg Tabs (Niacin) .Marland Kitchen... Take 1 tablet by mouth once a day  Labs Reviewed: SGOT: 33 (10/02/2009)   SGPT: 29 (10/02/2009)  Prior 10 Yr Risk Heart Disease: Not enough information (02/02/2007)   HDL:31 (07/07/2008), 30 (02/15/2007)  LDL:52 (07/07/2008), 84 (02/15/2007)  Chol:115 (07/07/2008), 138 (02/15/2007)  Trig:162 (07/07/2008), 122 (02/15/2007)  Complete Medication List: 1)  Plavix 75 Mg Tabs (Clopidogrel bisulfate) .... Take one tablet by mouth daily 2)  Enalapril Maleate 20 Mg Tabs (Enalapril maleate) .... Take one tablet by mouth daily 3)  Norvasc 10 Mg Tabs (Amlodipine besylate) .... Take one tablet by mouth daily 4)  Lipitor 10 Mg Tabs (Atorvastatin calcium) .... Take one tablet  by mouth daily 5)  Niacor 500 Mg Tabs (Niacin) .... Take 1 tablet by mouth once a day 6)  Coreg 25 Mg Tabs  (Carvedilol) .... Take one tablet by mouth twice daily 7)  Ambien 10 Mg Tabs (Zolpidem tartrate) .... Take one tablet by mouth at bedtime 8)  Neurontin 300 Mg Caps (Gabapentin) .... Take one tablet by mouth three times a day 9)  Lasix 40 Mg Tabs (Furosemide) .... Take one tablet by mouth three times a day 10)  Zyrtec 10 Mg Tabs (Cetirizine hcl) .... Take one tablet by mouth daily 11)  Venlafaxine Hcl 50 Mg Tabs (Venlafaxine hcl) .... Take one talbet by mouth at bedtime 12)  Amitriptyline Hcl 100 Mg Tabs (Amitriptyline hcl) .... Take 1 tablet by mouth at bedtime 13)  Metformin Hcl 850 Mg Tabs (Metformin hcl) .... Take 1 tablet by mouth two times a day 14)  Glipizide 10 Mg Tb24 (Glipizide) .... Take 1 tablet by mouth two times a day 15)  Ms Contin 60 Mg Xr12h-tab (Morphine sulfate) .... Take 1 tablet by mouth two times a day 16)  Hydrocodone-acetaminophen 10-325 Mg Tabs (Hydrocodone-acetaminophen) .... Take 1 tablet by mouth every 8 hours as needed for pain 17)  Moviprep 100 Gm Solr (Peg-kcl-nacl-nasulf-na asc-c) .... As per prep instructions.  Patient Instructions: 1)  Please schedule a follow-up appointment in 3 months. 2)  Please check your blood pressure regularly, if it is >170 please call clinic at [phone] 3)  Please check your sugar levels regularly and remember to bring the meter with you to the next clinic appointment, if the sugars are > 350 or < 60 please call us at [phone]  4)  Cigarette smoking is estimated to be responsible for over five million premature deaths worldwide, making it the leading preventable cause of death. The most important causes of smoking-related mortality include atherosclerotic cardiovascular disease, lung cancer, and chronic obstructive pulmonary disease (COPD).  Process Orders Check Orders Results:     [facility]: Order checked:     [phone] -- T- * Misc. Laboratory test -- No CPT codes found (CPT: ) Order queued for requisitioning for  [facility]: October 09, 2009 4:10 PM  Tests Sent for requisitioning (October 09, 2009 4:10 PM):     10/09/2009: [facility] -- T- * Misc. Laboratory test [phone] (signed)     Prevention & Chronic Care Immunizations   Influenza vaccine: Fluvax MCR  (04/26/2009)   Influenza vaccine deferral: Deferred  (10/02/2009)    Tetanus booster: Not documented   Td booster deferral: Not indicated  (10/09/2009)    Pneumococcal vaccine: Pneumovax  (10/02/2009)    H. zoster vaccine: Not documented   H. zoster vaccine deferral: Not indicated  (10/09/2009)  Colorectal Screening   Hemoccult: Not documented   Hemoccult action/deferral: Refused  (10/09/2009)    Colonoscopy: Not documented   Colonoscopy action/deferral: Refused  (10/09/2009)  Other Screening   PSA: 0.10  (10/02/2009)   PSA action/deferral: Discussed-decision deferred  (10/02/2009)   Smoking status: current  (10/09/2009)   Smoking cessation counseling: yes  (10/09/2009)  Diabetes Mellitus   HgbA1C: 7.4  (09/07/2009)    Eye exam: Not documented   Diabetic eye exam action/deferral: Deferred  (10/09/2009)    Foot exam: yes  (10/27/2007)   Foot exam action/deferral: Do today   High risk foot: Not documented   Foot care education: Not documented    Urine microalbumin/creatinine ratio: 419.9  (04/26/2009)    Diabetes flowsheet reviewed?: Yes   Progress toward A1C goal: Deteriorated  Lipids   Total Cholesterol: 115  (07/07/2008)   Lipid panel action/deferral: Lipid Panel ordered   LDL: 52  (07/07/2008)   LDL Direct: Not documented   HDL: 31  (07/07/2008)   Triglycerides: 162  (07/07/2008)    SGOT (AST): 33  (10/02/2009)   SGPT (ALT): 29  (10/02/2009)   Alkaline phosphatase: 65  (10/02/2009)   Total bilirubin: 0.4  (10/02/2009)    Lipid flowsheet reviewed?: Yes   Progress toward LDL goal: At goal  Hypertension   Last Blood Pressure: 137 / 77  (10/09/2009)   Serum creatinine: 1.09  (09/07/2009)   BMP  action: Ordered   Serum potassium 3.7  (09/07/2009)    Hypertension flowsheet reviewed?: Yes   Progress toward BP goal: At goal  Self-Management Support :   Personal Goals (by the next clinic visit) :     Personal A1C goal: 7  (10/02/2009)     Personal blood pressure goal: 130/80  (10/02/2009)     Personal LDL goal: 100  (10/02/2009)    Patient will work on the following items until the next clinic visit to reach self-care goals:     Medications and monitoring: take my medicines every day, check my blood sugar, examine my feet every day  (10/09/2009)     Eating: eat more vegetables, use fresh or frozen vegetables, eat foods that are low in salt, eat fruit for snacks and desserts, limit or avoid alcohol  (10/09/2009)     Activity: take a 30 minute walk every day  (10/09/2009)    Diabetes self-management support: Written self-care plan, Education handout, Resources for patients handout  (10/09/2009)  Diabetes care plan printed   Diabetes education handout printed   Last diabetes self-management training by diabetes educator: 08/26/2007   Last medical nutrition therapy: 08/26/2007    Hypertension self-management support: Written self-care plan, Education handout, Resources for patients handout  (10/09/2009)   Hypertension self-care plan printed.   Hypertension education handout printed    Lipid self-management support: Written self-care plan, Education handout, Resources for patients handout  (10/09/2009)   Lipid self-care plan printed.   Lipid education handout printed      Resource handout printed.   Nursing Instructions: Diabetic foot exam today

## 2010-07-25 NOTE — Letter (Signed)
[facility]   Imported By: Margie Billet 06/12/2010 13:46:37  _____________________________________________________________________  External Attachment:    Type:   Image     Comment:   External Document

## 2010-07-25 NOTE — Miscellaneous (Signed)
Summary: [facility] previsit  Clinical Lists Changes  Pt was on Plavix,the previsit was cancelled and colonoscopy was cancelled for 10/02/09 with Dr Leone Payor. Pt was scheduled to see Dr Leone Payor in the office on 09/20/09.

## 2010-07-25 NOTE — Letter (Signed)
[facility]   Imported By: Margie Billet 10/15/2009 14:21:43  _____________________________________________________________________  External Attachment:    Type:   Image     Comment:   External Document

## 2010-07-25 NOTE — Letter (Signed)
Summary: Moviprep Instructions  [facility]  [address]   [place], [place] [address]   Phone: [phone]  Fax: [phone]       Jonathon Galvan    Jun 15, 1946    MRN: [id number]        Procedure Day /Date: Tuesday 10-23-09     Arrival Time: 1:30 p.m.      Procedure Time: 2:30 p.m.     Location of Procedure:                    _x _  [facility] (4th Floor)   PREPARATION FOR COLONOSCOPY WITH MOVIPREP   Starting 5 days prior to your procedure 10-18-09  do not eat nuts, seeds, popcorn, corn, beans, peas,  salads, or any raw vegetables.  Do not take any fiber supplements (e.g. Metamucil, Citrucel, and Benefiber).  THE DAY BEFORE YOUR PROCEDURE         DATE: 10-22-09    DAY: Monday  1.  Drink clear liquids the entire day-NO SOLID FOOD  2.  Do not drink anything colored red or purple.  Avoid juices with pulp.  No orange juice.  3.  Drink at least 64 oz. (8 glasses) of fluid/clear liquids during the day to prevent dehydration and help the prep work efficiently.  CLEAR LIQUIDS INCLUDE: Water Jello Ice Popsicles Tea (sugar ok, no milk/cream) Powdered fruit flavored drinks Coffee (sugar ok, no milk/cream) Gatorade Juice: apple, white grape, white cranberry  Lemonade Clear bullion, consomm, broth Carbonated beverages (any kind) Strained chicken noodle soup Hard Candy                             4.  In the morning, mix first dose of MoviPrep solution:    Empty 1 Pouch A and 1 Pouch B into the disposable container    Add lukewarm drinking water to the top line of the container. Mix to dissolve    Refrigerate (mixed solution should be used within 24 hrs)  5.  Begin drinking the prep at 5:00 p.m. The MoviPrep container is divided by 4 marks.   Every 15 minutes drink the solution down to the next mark (approximately 8 oz) until the full liter is complete.   6.  Follow completed prep with 16 oz of clear liquid of your choice (Nothing red or purple).   Continue to drink clear liquids until bedtime.  7.  Before going to bed, mix second dose of MoviPrep solution:    Empty 1 Pouch A and 1 Pouch B into the disposable container    Add lukewarm drinking water to the top line of the container. Mix to dissolve    Refrigerate  THE DAY OF YOUR PROCEDURE      DATE: 10-23-09  DAY: Tuesday  Beginning at 10:30  a.m. (5 hours before procedure):         1. Every 15 minutes, drink the solution down to the next mark (approx 8 oz) until the full liter is complete.  2. Follow completed prep with 16 oz. of clear liquid of your choice.    3. You may drink clear liquids until 12:30 p.m.  (2 HOURS BEFORE PROCEDURE).   MEDICATION INSTRUCTIONS  Unless otherwise instructed, you should take regular prescription medications with a small sip of water   as early as possible the morning of your procedure.  Diabetic patients - see separate instructions.  Stop taking Plavix or  Aggrenox on  10-16-09    (7 days before procedure).              OTHER INSTRUCTIONS  You will need a responsible adult at least 67 years of age to accompany you and drive you home.   This person must remain in the waiting room during your procedure.  Wear loose fitting clothing that is easily removed.  Leave jewelry and other valuables at home.  However, you may wish to bring a book to read or  an iPod/MP3 player to listen to music as you wait for your procedure to start.  Remove all body piercing jewelry and leave at home.  Total time from sign-in until discharge is approximately 2-3 hours.  You should go home directly after your procedure and rest.  You can resume normal activities the  day after your procedure.  The day of your procedure you should not:   Drive   Make legal decisions   Operate machinery   Drink alcohol   Return to work  You will receive specific instructions about eating, activities and medications before you leave.    The above  instructions have been reviewed and explained to me by   Karl Bales RN  October 10, 2009 2:03 PM     I fully understand and can verbalize these instructions _____________________________ Date _________  Appended Document: Moviprep Instructions Pt didn't follow instructions for his colonoscopy prep today at all.  Colonscopy rescheduled for 10-23-09.  Per Dr. Leone Payor, pt told to restart his Plavix today.  Instructions given to both pt and sister and both voiced understanding.  New rx for Moviprep called to [facility]

## 2010-07-25 NOTE — Progress Notes (Signed)
Summary: med refill/gp  Phone Note Refill Request Message from:  Fax from Pharmacy on January 03, 2010 12:05 PM  Refills Requested: Medication #1:  COREG 25 MG TABS take one tablet by mouth twice daily   Dosage confirmed as above?Dosage Confirmed   Brand Name Necessary? No   Supply Requested: 1 year   Last Refilled: 02/27/2009 Last appt. May 13.   Method Requested: Fax to Local Pharmacy Initial call taken by: Chinita Pester RN,  January 03, 2010 12:05 PM  Follow-up for Phone Call        Refill approved-nurse to complete Follow-up by: Lars Mage MD,  January 03, 2010 12:15 PM  Additional Follow-up for Phone Call Additional follow up Details #1::        Rx refill request faxed to [facility]. Additional Follow-up by: Chinita Pester RN,  January 04, 2010 10:23 AM    Prescriptions: COREG 25 MG TABS (CARVEDILOL) take one tablet by mouth twice daily  #30 x 11   Entered and Authorized by:   Lars Mage MD   Signed by:   Lars Mage MD on 01/03/2010   Method used:   Faxed to ...       [facility] (retail)       [address]       [place], [place]  [address]  [place]       Ph: [phone]       Fax: [phone]   RxID:   [id number]

## 2010-07-25 NOTE — Miscellaneous (Signed)
Summary: [facility]  [facility]   Imported By: Margie Billet 10/01/2009 10:09:12  _____________________________________________________________________  External Attachment:    Type:   Image     Comment:   External Document

## 2010-07-25 NOTE — Medication Information (Signed)
Summary: RX HISTORY REPORT  RX HISTORY REPORT   Imported By: Margie Billet 10/18/2009 10:36:22  _____________________________________________________________________  External Attachment:    Type:   Image     Comment:   External Document

## 2010-07-25 NOTE — Medication Information (Signed)
Summary: Tax adviser   Imported By: Florinda Marker 08/23/2009 09:30:42  _____________________________________________________________________  External Attachment:    Type:   Image     Comment:   External Document

## 2010-07-25 NOTE — Letter (Signed)
Summary: Appointment - Missed  [facility], Main Office  [address]   [place], [place] [address]   Phone: [phone]  Fax: [phone]     Oct 30, 2009 MRN: [id number]   Jonathon Galvan [address] [place], [place]  [address]   Dear Jonathon Galvan,  Our records indicate you missed your appointment on  10/26/2009 with Dr. Shirlee Latch.                       It is very important that we reach you to reschedule this appointment. We look forward to participating in your health care needs. Please contact us at the number listed above at your earliest convenience to reschedule this appointment.     Sincerely,   Migdalia Dk [facility] Scheduling Team

## 2010-07-25 NOTE — Medication Information (Signed)
Summary: MS CONTIN/MORPHINE SULFATE  MS CONTIN/MORPHINE SULFATE   Imported By: Margie Billet 04/25/2010 09:15:12  _____________________________________________________________________  External Attachment:    Type:   Image     Comment:   External Document

## 2010-07-25 NOTE — Medication Information (Signed)
Summary: Tax adviser   Imported By: Margie Billet 09/13/2009 11:44:56  _____________________________________________________________________  External Attachment:    Type:   Image     Comment:   External Document

## 2010-07-25 NOTE — Miscellaneous (Signed)
Summary: [facility]   Imported By: Margie Billet 10/18/2009 12:13:51  _____________________________________________________________________  External Attachment:    Type:   Image     Comment:   External Document

## 2010-07-25 NOTE — Consult Note (Signed)
Summary: Medical Clearance: Dr. Barbette Merino  Medical Clearance: Dr. Barbette Merino   Imported By: Shon Hough 01/11/2010 15:57:39  _____________________________________________________________________  External Attachment:    Type:   Image     Comment:   External Document

## 2010-07-25 NOTE — Medication Information (Signed)
Summary: RX/ DOWN METER  RX/ DOWN METER   Imported By: Margie Billet 10/03/2009 14:01:40  _____________________________________________________________________  External Attachment:    Type:   Image     Comment:   External Document

## 2010-07-25 NOTE — Assessment & Plan Note (Signed)
Summary: to go over lab results per Dr. Dolan Galvan)   Vital Signs:  Patient profile:   65 year old male Height:      69.5 inches Weight:      215.2 pounds BMI:     31.44 Temp:     97.9 degrees F oral Pulse rate:   70 / minute BP sitting:   137 / 76  (right arm)  Vitals Entered By: Jonathon Galvan NT II (October 19, 2009 3:01 PM) CC: see note Is Patient Diabetic? Yes Did you bring your meter with you today? No Nutritional Status BMI of > 30 = obese  Have you ever been in a relationship where you felt threatened, hurt or afraid?No   Does patient need assistance? Functional Status Self care Ambulation Normal   Primary Care Provider:  Brooks Sailors MD  CC:  see note.  History of Present Illness: Jonathon Galvan is a 65 y/o M with PMH outlined in the EMR who presents today for f/u UDS and continued management of his chronic pain.  Please refer to 10/17/09 office visit and appended UDS lab results for complete pertinent information.  He is no longer taking the brand name Percocet prescribed by ophtho.  He can recall picking the rx up "a few weeks ago" (prescribed by dr. Mitzi Galvan on 10/08/09 #30) but cannot recall the date he last took a Percocet pill.  He states he simply took them as needed and did not pay attention to the date they ran out.  He reports that after the percocet ran out, he increased the use of his MS Contin and took his last MS Contin on Wed evening.  He has not taken any pain medication since.  He has no other concerns/complaints at this time.   Preventive Screening-Counseling & Management  Alcohol-Tobacco     Smoking Status: current     Smoking Cessation Counseling: yes     Packs/Day: 5 cig per day  Caffeine-Diet-Exercise     Does Patient Exercise: no  Current Problems (verified): 1)  Special Screening Malignant Neoplasm of Prostate  (ICD-V76.44) 2)  Leg Pain, Chronic, Left  (ICD-729.5) 3)  Lbbb  (ICD-426.3) 4)  Other Primary Cardiomyopathies  (ICD-425.4) 5)   Diabetes Mellitus, Type II  (ICD-250.00) 6)  Thrombocytopenia  (ICD-287.5) 7)  Pvd  (ICD-443.9) 8)  Tobacco Use  (ICD-305.1) 9)  Allergic Rhinitis  (ICD-477.9) 10)  Cocaine Abuse  (ICD-305.60) 11)  Complication, Amputation Stump Nos  (ICD-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 Take One Tablet By Mouth Daily 2)  Enalapril Maleate 20 Mg Tabs (Enalapril Maleate) .... Take One Tablet By Mouth Daily 3)  Norvasc 10 Mg Tabs (Amlodipine Besylate) .... Take One Tablet By Mouth Daily 4)  Lipitor 10 Mg Tabs (Atorvastatin Calcium) .... Take One Tablet  By Mouth Daily 5)  Niacor 500 Mg Tabs (Niacin) .... Take 1 Tablet By Mouth Once A Day 6)  Coreg 25 Mg Tabs (Carvedilol) .... Take One Tablet By Mouth Twice Daily 7)  Ambien 10 Mg Tabs (Zolpidem Tartrate) .... Take One Tablet By Mouth At Bedtime 8)  Neurontin 300 Mg Caps (Gabapentin) .... Take One Tablet By Mouth Three Times A Day 9)  Lasix 40 Mg Tabs (Furosemide) .... Take One Tablet By Mouth Three Times A  Day 10)  Zyrtec 10 Mg Tabs (Cetirizine Hcl) .... Take One Tablet By Mouth Daily 11)  Venlafaxine Hcl 50 Mg Tabs (Venlafaxine Hcl) .... Take One Talbet By Mouth At Bedtime 12)  Amitriptyline Hcl 100 Mg Tabs (Amitriptyline Hcl) .... Take 1 Tablet By Mouth At Bedtime 13)  Metformin Hcl 850 Mg Tabs (Metformin Hcl) .... Take 1 Tablet By Mouth Two Times A Day 14)  Glipizide 10 Mg  Tb24 (Glipizide) .... Take 1 Tablet By Mouth Two Times A Day 15)  Ms Contin 60 Mg Xr12h-Tab (Morphine Sulfate) .... Take 1 Tablet By Mouth Two Times A Day 16)  Moviprep 100 Gm  Solr (Peg-Kcl-Nacl-Nasulf-Na Asc-C) .... As Per Prep Instructions. 17)  Moviprep 100 Gm  Solr (Peg-Kcl-Nacl-Nasulf-Na Asc-C) .... As Per Prep Instructions. 18)  Morphine Sulfate 15 Mg  Tabs (Morphine Sulfate) .... Take One Pill By Mouth Three Times A Day As Needed For Pain  Allergies (verified): No Known Drug Allergies  Past History:  Past medical, surgical, family and social histories (including risk factors) reviewed for relevance to current acute and chronic problems.  Past Medical History: Reviewed history from 10/02/2009 and no changes required. 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 Riley Galvan for pain mgmt  (painful stump) HEPATITIS C    - doc'd 12/2002 CHOLELITHIASIS - documented in 07/2007 on abdominal CT scan COCAINE USE    - s/p cardiopulmonary arrest 12/25/2002 Jonathon Galvan)    - 2D echo: 20-25% LVEF, cardiac cath: 25-30% mid RCA stenosis, LVEF 40%, EPS study: LBBB but      otherwise normal Allergic rhinitis THROMBOCYTOPENIA    - 07/2007 while hospitalized. Went down to 90. Thought to be 2/2 Neurontin.  Past Surgical History: Reviewed history from 09/20/2009 and no changes required. 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 Mother died of an MI in her 7s.  Father died from complications of cirrhosis. Brother had a fatal MI. No FH of Colon Cancer:  Social History: Reviewed history from 09/20/2009 and no changes required. Tobacco: almost 1 PPD x 50 yrs.  Now smokes 5-7 cigarettes/day. Had 5 children, now 45. 61 y/o dgtr died 2008-04-20 from ESRD. His sister constantly checks in on him and comes with him to all his apptmts. Pt has an aide to help  him during the day. Alcohol- occ. 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 Daily Caffeine Use  occ  Review of Systems MS:  Complains of joint pain and muscle aches; denies joint redness, joint swelling, and loss of strength.  Physical Exam  General:  alert, well-developed, well-nourished, and well-hydrated.   Head:  normocephalic and atraumatic.   Lungs:  normal respiratory effort, normal breath sounds, no crackles, and no wheezes.   Msk:  left AKA Neurologic:  alert & oriented X3.     Impression & Recommendations:  Problem # 1:  LEG PAIN,  CHRONIC, LEFT (ICD-729.5) Pts repeat UDS was positive, as expected.  Per Dr.Butcher'sfollow up converstation with the lab, it is possible that the urine sample may have been mislabled or mishandled in some way that would result in a false negative morphine/opiate result.   The timeline pt reports regarding his Percocet rx is consistent with that reported by Dr. Mitzi Galvan.  Regarding his increased use of MS Contin; at his appointment with Dr. Rogelia Boga on 4/27 pt reported having sufficienct medication to last until his appointment today.  While I doubt he took all of his remaining medicine on 4/27, it is conceivable that he potentially ran out on thursday due to his reported increased use of MS Contin after running out of breakthrough medications.  Reviewed the use of narcotic medications and the increased risk for death, coma, and other adverse effect from narcotic overdose, particularly with long acting agents such as MS Contin.  Pt aknowledged understanding of this and agrees to not take more MS Contin than directed and to call the clinic if his pain is not well controlled.  Reviewed pts pain contract.  He expresses understanding that any and all narcotics are to be prescribed by [facility] alone and to be filled at the same pharmacy.   He understands that if any other physicians,surgeons, or specialist need/recommend any  additional narcotics that he is reponsible for informing the physician about his pain contract and the need for that physician to contact [facility] directly regarding any narcotic prescriptions.  He understands that he is to take his medications only as directed and will not receive any refills before the rx is due.  He understands that failure to do any of these things will result in violation of his contract with the result that the [facility] will no longer prescribe any narcotic medications for him.   Spent approx 15 minutes reviewing old records and lab data and an additional discussing use of narcotics, pain contract, and plan for continued pain regimen with pt.  Problem # 2:  LEG PAIN, CHRONIC, LEFT (ICD-729.5) Continued from above..  Regarding pts continued pain regimen:  I am not comfortable prescribing him any Percocet as part of his pain regimen.  This medication has a very high street value and Jonathon Galvan pain was previously well controlled with MS Contin and hydrocodone prior to the Percocet rx given by his eye doctor.  His sudded need for brand name Percocet for continued pain management when he was previously so well controlled seems slightly suspicious to me.  Percocet is also not the idea agent with long acting morphine as it is best to provide breakthrough coverage with a medication that is pharmacologically similar to the long acting agent.  As he is using MS Contin for long acting pain control, and he feels his pain is no longer controlled with hydrocodone, will use immediate release morphine for breakthough pain.   Will start with an initial dose of morphine sulfate IR 15mg  three times a day.  Advised pt to use this as directed.  If he finds this dose does not control his pain and he needs to take it more frequently, then he needs to be seen in clinic for adjustment of his pain regimen.  Will schedule him a f/u appt in 2 weeks to review his pain control and use of IR morphine.  If he is using  the IR pill every 4-6 hours for breakthrough, would increase his MS Contin dose and continue with the current IR dose  of 15mg  three times a day.  Complete Medication List: 1)  Plavix 75 Mg Tabs (Clopidogrel bisulfate) .... Take one tablet by mouth daily 2)  Enalapril Maleate 20 Mg Tabs (Enalapril maleate) .... Take one tablet by mouth daily 3)  Norvasc 10 Mg Tabs (Amlodipine besylate) .... Take one tablet by mouth daily 4)  Lipitor 10 Mg Tabs (Atorvastatin calcium) .... Take one tablet  by mouth daily 5)  Niacor 500 Mg Tabs (Niacin) .... Take 1 tablet by mouth once a day 6)  Coreg 25 Mg Tabs (Carvedilol) .... Take one tablet by mouth twice daily 7)  Ambien 10 Mg Tabs (Zolpidem tartrate) .... Take one tablet by mouth at bedtime 8)  Neurontin 300 Mg Caps (Gabapentin) .... Take one tablet by mouth three times a day 9)  Lasix 40 Mg Tabs (Furosemide) .... Take one tablet by mouth three times a day 10)  Zyrtec 10 Mg Tabs (Cetirizine hcl) .... Take one tablet by mouth daily 11)  Venlafaxine Hcl 50 Mg Tabs (Venlafaxine hcl) .... Take one talbet by mouth at bedtime 12)  Amitriptyline Hcl 100 Mg Tabs (Amitriptyline hcl) .... Take 1 tablet by mouth at bedtime 13)  Metformin Hcl 850 Mg Tabs (Metformin hcl) .... Take 1 tablet by mouth two times a day 14)  Glipizide 10 Mg Tb24 (Glipizide) .... Take 1 tablet by mouth two times a day 15)  Ms Contin 60 Mg Xr12h-tab (Morphine sulfate) .... Take 1 tablet by mouth two times a day 16)  Moviprep 100 Gm Solr (Peg-kcl-nacl-nasulf-na asc-c) .... As per prep instructions. 17)  Moviprep 100 Gm Solr (Peg-kcl-nacl-nasulf-na asc-c) .... As per prep instructions. 18)  Morphine Sulfate 15 Mg Tabs (Morphine sulfate) .... Take one pill by mouth three times a day as needed for pain Prescriptions: MS CONTIN 60 MG XR12H-TAB (MORPHINE SULFATE) Take 1 tablet by mouth two times a day  #60 x 0   Entered and Authorized by:   Nelda Bucks DO   Signed by:   Nelda Bucks DO on  10/19/2009   Method used:   Print then Give to Patient   RxID:   [id number] MORPHINE SULFATE 15 MG TABS (MORPHINE SULFATE) Take one pill by mouth three times a day as needed for pain  #90 x 0   Entered and Authorized by:   Nelda Bucks DO   Signed by:   Nelda Bucks DO on 10/19/2009   Method used:   Print then Give to Patient   RxID:   [id number]

## 2010-07-25 NOTE — Progress Notes (Signed)
Summary: refill, new pharmacy/ hla  Phone Note Refill Request Message from:  Fax from Pharmacy on October 18, 2009 4:30 PM  Refills Requested: Medication #1:  LASIX 40 MG TABS take one tablet by mouth three times a day   Dosage confirmed as above?Dosage Confirmed  Medication #2:  AMBIEN 10 MG TABS take one tablet by mouth at bedtime   Dosage confirmed as above?Dosage Confirmed new pharm  Initial call taken by: Marin Roberts RN,  October 18, 2009 4:30 PM  Follow-up for Phone Call        I faxed in Lasix, Remus Loffler will have to be called in. Follow-up by: Brooks Sailors MD,  October 20, 2009 2:17 PM    Prescriptions: AMBIEN 10 MG TABS (ZOLPIDEM TARTRATE) take one tablet by mouth at bedtime  #30 x 3   Entered and Authorized by:   Brooks Sailors MD   Signed by:   Brooks Sailors MD on 10/20/2009   Method used:   Telephoned to ...       [facility] (retail)       [address]       [place], [place]  [address]  [place]       Ph: [phone]       Fax: [phone]   RxID:   [id number] LASIX 40 MG TABS (FUROSEMIDE) take one tablet by mouth three times a day  #120 x 0   Entered and Authorized by:   Brooks Sailors MD   Signed by:   Brooks Sailors MD on 10/20/2009   Method used:   Faxed to ...       [facility] (retail)       [address]       [place], [place]  [address]  [place]       Ph: [phone]       Fax: [phone]   RxID:   [id number]

## 2010-07-25 NOTE — Assessment & Plan Note (Signed)
Primary Care Provider:  Brooks Sailors MD   History of Present Illness: Mr Jonathon Galvan came to front desk to ask Tyler Aas about getting name brand Percocets.  He has pain contract for MSContin and hydrocodone.    I investigated chart and talked to [place].  Percs have higher resale value.  Has gotten brand name percs from Dr Mitzi Davenport (optho) on 07/06/09 and 10/08/09 (#30).  I confirmed this with Dr Mitzi Davenport who Rx'd it for iritis.    I spoke to pt and his sister who was present.  Pt stated that about six months ago he was changed from percs to hydrodocone.  This is not verified by the EMR.  He was on Percs here but they were D/C in 2008 for economic reasons.  He has been on hydrocodone since 4/10 when we took over narc Rx when pain clinic dismissed him for a UDS + for cocaine.  Pt states after this alleged med change his pain has been increasing and he has had to increase his use of hydrocone from 3 per day (when he was supposedly on perc) to 8 per day now that he is on "generic hydrocodone".  He has had to increase his use of MSContin to keep pain under control.  He is requesting we switch him back to name brand percs.  I had Venita Sheffield ask him initially and then I asked him about three different ways whether he ever ran out of meds, skipped a dose or two and his answer was always no, he never missed a dose.   His UDS 4/12 was negative for opiates.  When asked about this he doesn't know why this would happen.    When asked about getting narcs from other doctors, he said no but his sister said yes from the eye MD.  He then explained why he got this and for what condition.  When I explained this was a violation of his signed contract that he brought in to clinic with him, they exclamed amazament that this was part of the contract.  I have to wonder if now that he got brand name percs from optho, he now knows to request brand name from [place].  The timing certainly fits.    Today, I am repeating UDS.  I am also adding  oxycodone screen.  If UDS negative for opiates, will consider quantitative screen for opiates.  Will schedule pt for appt Thur PM or Friday AM so we can discuss face to face the results and decide whether to persue the quantitative screen.    20 minutes were spent face to face.  An additional 30-45 minutes were required to coordinate care and document.    Allergies: No Known Drug Allergies   Impression & Recommendations:  Problem # 1:  LEG PAIN, CHRONIC, LEFT (ICD-729.5) See HPI  Complete Medication List: 1)  Plavix 75 Mg Tabs (Clopidogrel bisulfate) .... Take one tablet by mouth daily 2)  Enalapril Maleate 20 Mg Tabs (Enalapril maleate) .... Take one tablet by mouth daily 3)  Norvasc 10 Mg Tabs (Amlodipine besylate) .... Take one tablet by mouth daily 4)  Lipitor 10 Mg Tabs (Atorvastatin calcium) .... Take one tablet  by mouth daily 5)  Niacor 500 Mg Tabs (Niacin) .... Take 1 tablet by mouth once a day 6)  Coreg 25 Mg Tabs (Carvedilol) .... Take one tablet by mouth twice daily 7)  Ambien 10 Mg Tabs (Zolpidem tartrate) .... Take one tablet by mouth at bedtime 8)  Neurontin  300 Mg Caps (Gabapentin) .... Take one tablet by mouth three times a day 9)  Lasix 40 Mg Tabs (Furosemide) .... Take one tablet by mouth three times a day 10)  Zyrtec 10 Mg Tabs (Cetirizine hcl) .... Take one tablet by mouth daily 11)  Venlafaxine Hcl 50 Mg Tabs (Venlafaxine hcl) .... Take one talbet by mouth at bedtime 12)  Amitriptyline Hcl 100 Mg Tabs (Amitriptyline hcl) .... Take 1 tablet by mouth at bedtime 13)  Metformin Hcl 850 Mg Tabs (Metformin hcl) .... Take 1 tablet by mouth two times a day 14)  Glipizide 10 Mg Tb24 (Glipizide) .... Take 1 tablet by mouth two times a day 15)  Ms Contin 60 Mg Xr12h-tab (Morphine sulfate) .... Take 1 tablet by mouth two times a day 16)  Hydrocodone-acetaminophen 10-325 Mg Tabs (Hydrocodone-acetaminophen) .... Take 1 tablet by mouth every 8 hours as needed for pain 17)   Moviprep 100 Gm Solr (Peg-kcl-nacl-nasulf-na asc-c) .... As per prep instructions. 18)  Moviprep 100 Gm Solr (Peg-kcl-nacl-nasulf-na asc-c) .... As per prep instructions.  Other Orders: T-Drug Screen-Urine, (single) [phone]) T- * Misc. Laboratory test [phone]) T- * Misc. Laboratory test [id number])  Process Orders Check Orders Results:     [facility]: Order checked:     [phone] -- T- * Misc. Laboratory test -- No CPT codes found (CPT: ) Tests Sent for requisitioning (October 17, 2009 2:46 PM):     10/17/2009: [facility] -- T-Drug Screen-Urine, (single) [[id number]] (signed)     10/17/2009: [facility] -- T- * Misc. Laboratory test [phone] (signed)     10/17/2009: [facility] -- T- * Misc. Laboratory test [phone] (signed)

## 2010-07-25 NOTE — Progress Notes (Signed)
Summary: Diabetes Testing frequency/dmr  Phone Note Outgoing Call   Call placed by: Jamison Neighbor RD,CDE,  Nov 12, 2009 9:44 AM Summary of Call: Called patient about second mail order company requesting diabetes supplies in the past year and this notice is for increased times a day testing from 1x- 3x a day. Patient is not on insulin- so 1x is what is allowed, but says he tests 3x a day. He agreed to bring meter to [place] for download so we can document in his chart # of times a day he is testing. Also,  for more than 1x/day not on insulin Medicare requires reason for increased testing be documented in chart. will put notice in Dr. Janalyn Harder box awaiting meter download and signature if appropriate.Marland Kitchen

## 2010-07-25 NOTE — Letter (Signed)
Summary: Diabetic Instructions  [facility]  [address] [place], [place] [address]   Phone: [phone]  Fax: [phone]    Jonathon Galvan 1946-01-10 MRN: [id number]   _X_   ORAL DIABETIC MEDICATION INSTRUCTIONS  The day before your procedure:   Take your diabetic pill as you do normally  The day of your procedure:   Do not take your diabetic pill    We will check your blood sugar levels during the admission process and again in Recovery before discharging you home

## 2010-07-25 NOTE — Progress Notes (Signed)
Summary: med change/gp  Phone Note Other Incoming   Summary of Call: Received fax from [facility],  the prior authorization for  Niaspan has been denied.  The preferred drug is Niacor; please change oryou  can file an appeal.  Thanks Initial call taken by: Chinita Pester RN,  August 17, 2009 10:19 AM  Follow-up for Phone Call        I called in the new Rx to [facility].  Thanks. Follow-up by: Brooks Sailors MD,  August 17, 2009 4:55 PM    New/Updated Medications: NIACOR 500 MG TABS (NIACIN) Take 1 tablet by mouth once a day Prescriptions: NIACOR 500 MG TABS (NIACIN) Take 1 tablet by mouth once a day  #30 x 5   Entered and Authorized by:   Brooks Sailors MD   Signed by:   Brooks Sailors MD on 08/17/2009   Method used:   Telephoned to ...       [facility] (retail)       [address]       [place], [place]  [address]       Ph: [phone]       Fax: [phone]   RxID:   [id number]

## 2010-07-25 NOTE — Assessment & Plan Note (Signed)
Summary: EST-MED CLEAR FOR DENTAL EXTRACTIONS/CH   Vital Signs:  Patient profile:   65 year old male Height:      69.5 inches (176.53 cm) Weight:      215.9 pounds (98.14 kg) BMI:     31.54 Temp:     92 degrees F oral Pulse rate:   59 / minute BP sitting:   122 / 71  (left arm)  Vitals Entered By: Cynda Familia Duncan Dull) (April 08, 2010 2:46 PM) CC: med clearance for dental extractions, discuss pain med Is Patient Diabetic? Yes Did you bring your meter with you today? No Nutritional Status BMI of > 30 = obese  Have you ever been in a relationship where you felt threatened, hurt or afraid?No   Does patient need assistance? Ambulation Impaired:Risk for fall Comments w/c   Primary Care Provider:  Lars Mage MD  CC:  med clearance for dental extractions and discuss pain med.  History of Present Illness: Patient is here today to discuss his medical clearance for dental extractions and also discuss his pain meds.  He says that the pain meds that have him on Morphine IR is not helping him with the pain in his left leg and he specifically asks for percocet number 10 for pain relief as it worked in the past. (Though I do not see it prescribed in our system).  He denies any new sicknesses or hospitalizations, no chest pain episodes, no fevers, no chills, no abdominal or urinary concerns. No recent changes in appetite, weight.   Preventive Screening-Counseling & Management  Alcohol-Tobacco     Smoking Status: current     Smoking Cessation Counseling: yes     Packs/Day: 0.5  Problems Prior to Update: 1)  Leg Pain, Chronic, Left  (ICD-729.5) 2)  Special Screening Malignant Neoplasm of Prostate  (ICD-V76.44) 3)  Leg Pain, Chronic, Left  (ICD-729.5) 4)  Lbbb  (ICD-426.3) 5)  Other Primary Cardiomyopathies  (ICD-425.4) 6)  Diabetes Mellitus, Type II  (ICD-250.00) 7)  Thrombocytopenia  (ICD-287.5) 8)  Pvd  (ICD-443.9) 9)  Tobacco Use  (ICD-305.1) 10)  Allergic Rhinitis   (ICD-477.9) 11)  Cocaine Abuse  (ICD-305.60) 12)  Complication, 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 Take One Tablet By Mouth Daily 2)  Enalapril Maleate 20 Mg Tabs (Enalapril Maleate) .... Take One Tablet By Mouth Twice Daily. 3)  Norvasc 10 Mg Tabs (Amlodipine Besylate) .... Take One Tablet By Mouth Daily 4)  Lipitor 10 Mg Tabs (Atorvastatin Calcium) .... Take One Tablet  By Mouth Daily 5)  Niacor 500 Mg Tabs (Niacin) .... Take 1 Tablet By Mouth Once A Day 6)  Coreg 25 Mg Tabs (Carvedilol) .... Take One Tablet By Mouth Twice Daily 7)  Ambien 10 Mg Tabs (Zolpidem Tartrate) .... Take One Tablet By Mouth At Bedtime 8)  Neurontin 300 Mg Caps (Gabapentin) .... Take One Tablet By Mouth Three Times A Day 9)  Lasix 40 Mg Tabs (Furosemide) .... Take One Tablet By Mouth Three Times A Day 10)  Zyrtec 10 Mg Tabs (Cetirizine Hcl) .... Take One Tablet By Mouth Daily 11)  Venlafaxine Hcl 50 Mg Tabs (Venlafaxine Hcl) .... Take One Talbet By Mouth At Bedtime 12)  Amitriptyline Hcl 100 Mg Tabs (Amitriptyline Hcl) .Marland KitchenMarland KitchenMarland Kitchen  Take 1 Tablet By Mouth At Bedtime 13)  Metformin Hcl 850 Mg Tabs (Metformin Hcl) .... Take 1 Tablet By Mouth Two Times A Day 14)  Glipizide 10 Mg  Tb24 (Glipizide) .... Take 1 Tablet By Mouth Two Times A Day 15)  Ms Contin 60 Mg Xr12h-Tab (Morphine Sulfate) .... Take 1 Tablet By Mouth Two Times A Day 16)  Morphine Sulfate 15 Mg Tabs (Morphine Sulfate) .... Take One Pill By Mouth Three Times A Day As Needed For Pain  Current Medications (verified): 1)  Plavix 75 Mg Tabs (Clopidogrel Bisulfate) .... Take One Tablet By Mouth Daily 2)  Enalapril Maleate 20 Mg Tabs (Enalapril Maleate) .... Take One Tablet By Mouth Twice Daily. 3)  Norvasc 10 Mg Tabs  (Amlodipine Besylate) .... Take One Tablet By Mouth Daily 4)  Lipitor 10 Mg Tabs (Atorvastatin Calcium) .... Take One Tablet  By Mouth Daily 5)  Niacor 500 Mg Tabs (Niacin) .... Take 1 Tablet By Mouth Once A Day 6)  Coreg 25 Mg Tabs (Carvedilol) .... Take One Tablet By Mouth Twice Daily 7)  Ambien 10 Mg Tabs (Zolpidem Tartrate) .... Take One Tablet By Mouth At Bedtime 8)  Neurontin 300 Mg Caps (Gabapentin) .... Take One Tablet By Mouth Three Times A Day 9)  Lasix 40 Mg Tabs (Furosemide) .... Take One Tablet By Mouth Three Times A Day 10)  Zyrtec 10 Mg Tabs (Cetirizine Hcl) .... Take One Tablet By Mouth Daily 11)  Venlafaxine Hcl 50 Mg Tabs (Venlafaxine Hcl) .... Take One Talbet By Mouth At Bedtime 12)  Amitriptyline Hcl 100 Mg Tabs (Amitriptyline Hcl) .... Take 1 Tablet By Mouth At Bedtime 13)  Metformin Hcl 850 Mg Tabs (Metformin Hcl) .... Take 1 Tablet By Mouth Two Times A Day 14)  Glipizide 10 Mg  Tb24 (Glipizide) .... Take 1 Tablet By Mouth Two Times A Day 15)  Ms Contin 60 Mg Xr12h-Tab (Morphine Sulfate) .... Take 1 Tablet By Mouth Two Times A Day 16)  Morphine Sulfate 15 Mg Tabs (Morphine Sulfate) .... 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 Riley Kill for pain mgmt  (painful stump) HEPATITIS C    - doc'd 12/2002 CHOLELITHIASIS - documented in 07/2007 on abdominal CT scan COCAINE USE     - s/p cardiopulmonary arrest 12/25/2002 [name])    - 2D echo: 20-25% LVEF, cardiac cath: 25-30% mid RCA stenosis, LVEF 40%, EPS study: LBBB but      otherwise normal Allergic rhinitis THROMBOCYTOPENIA    - 07/2007 while hospitalized. Went down to 90. Thought to be 2/2 Neurontin.  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  Father died from complications of cirrhosis. Brother had a  fatal MI. No FH of Colon Cancer:  Social History: Last updated: 09/20/2009 Tobacco: almost 1 PPD x 50 yrs.  Now smokes 5-7 cigarettes/day. Had 5 children, now 4. 18 y/o dgtr died Apr 20, 2008 from ESRD. His sister constantly checks in on him and comes with him to all his apptmts. Pt has an aide to help him during the day. Alcohol- occ. 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 Daily Caffeine Use  occ  Risk Factors: Exercise: no (11/02/2009)  Risk Factors: Smoking Status: current (04/08/2010) Packs/Day: 0.5 (04/08/2010)  Family History: Reviewed history from 09/20/2009 and no changes required. Mother died of an MI in her 57s.  Father died from complications of cirrhosis. Brother had a fatal MI. No FH of Colon Cancer:  Social History: Reviewed history from 09/20/2009 and no changes required. Tobacco: almost 1 PPD x 50 yrs.  Now smokes 5-7 cigarettes/day. Had 5 children, now 43. 54 y/o dgtr died 20-Apr-2008 from ESRD. His sister constantly checks in on him and comes with him to all his apptmts. Pt has an aide to help him during the day. Alcohol- occ. 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 Daily Caffeine Use  occ  Review of Systems      See HPI  Physical Exam  Additional Exam:  Gen: AOx3, in no acute distress Eyes: PERRL, EOMI ENT:MMM, No erythema noted in posterior pharynx Neck: No  JVD, No LAP Chest: CTAB with  good respiratory effort CVS: regular rhythmic rate, NO M/R/G, S1 S2 normal Abdo: soft,ND, BS+x4, Non tender and No hepatosplenomegaly EXT: No odema noted, left AKA Neuro: Non focal, gait is normal Skin: no rashes noted.    Impression & Recommendations:  Problem # 1:  PREOPERATIVE EXAMINATION (ICD-V72.84) Assessment New Reviwing the preoperative risk score revised Elonda Husky, he has just 1 rick factor postive and in the view his low risk procedure(dental extraction) his risk of having a heart attack is 0.9%. We will clear him for undergoing the dental extraction without any further evaluation. We do need him to stop taking plavix 5 days before the procedure which will decrease his risk of bleeding. He may start it right back after his dental procedure is done.        Problem # 2:  CHF (ICD-428.0) Assessment: Unchanged Patient does not have any symptoms of SOB, increased dyspnoea or use of increased pillow. No changes. Study Conclusions    - Left ventricle: Systolic function was moderately to severely     reduced. The estimated ejection fraction was in the range of 30%     to 35%. Features are consistent with a pseudonormal left     ventricular filling pattern, with concomitant abnormal relaxation     and increased filling pressure (grade 2 diastolic dysfunction).     Doppler parameters are consistent with elevated mean left atrial     filling pressure.   - Ventricular septum: Septal motion showed severe dyssynergy and     paradox. These changes are consistent with a left bundle branch     block.   - Mitral valve: Calcified annulus. Mild regurgitation.   - Left atrium: The atrium was moderately dilated.  His updated medication list for this problem includes:    Plavix 75 Mg Tabs (Clopidogrel bisulfate) .Marland Kitchen... Take one tablet by mouth daily    Enalapril Maleate 20 Mg Tabs (Enalapril maleate) .Marland Kitchen... Take one tablet by mouth twice daily.  Coreg 25 Mg Tabs  (Carvedilol) .Marland Kitchen... Take one tablet by mouth twice daily    Lasix 40 Mg Tabs (Furosemide) .Marland Kitchen... Take one tablet by mouth three times a day  Problem # 3:  DIABETES MELLITUS, TYPE II (ICD-250.00) Assessment: Comment Only Well controlled. His updated medication list for this problem includes:    Enalapril Maleate 20 Mg Tabs (Enalapril maleate) .Marland Kitchen... Take one tablet by mouth twice daily.    Metformin Hcl 850 Mg Tabs (Metformin hcl) .Marland Kitchen... Take 1 tablet by mouth two times a day    Glipizide 10 Mg Tb24 (Glipizide) .Marland Kitchen... Take 1 tablet by mouth two times a day  Labs Reviewed: Creat: 1.02 (03/04/2010)    Reviewed HgBA1c results: 7.5 (03/04/2010)  7.4 (09/07/2009)  Problem # 4:  TOBACCO USE (ICD-305.1) Assessment: Comment Only  Encouraged smoking cessation and discussed different methods for smoking cessation.   Complete Medication List: 1)  Plavix 75 Mg Tabs (Clopidogrel bisulfate) .... Take one tablet by mouth daily 2)  Enalapril Maleate 20 Mg Tabs (Enalapril maleate) .... Take one tablet by mouth twice daily. 3)  Norvasc 10 Mg Tabs (Amlodipine besylate) .... Take one tablet by mouth daily 4)  Lipitor 10 Mg Tabs (Atorvastatin calcium) .... Take one tablet  by mouth daily 5)  Niacor 500 Mg Tabs (Niacin) .... Take 1 tablet by mouth once a day 6)  Coreg 25 Mg Tabs (Carvedilol) .... Take one tablet by mouth twice daily 7)  Ambien 10 Mg Tabs (Zolpidem tartrate) .... Take one tablet by mouth at bedtime 8)  Neurontin 300 Mg Caps (Gabapentin) .... Take one tablet by mouth three times a day 9)  Lasix 40 Mg Tabs (Furosemide) .... Take one tablet by mouth three times a day 10)  Zyrtec 10 Mg Tabs (Cetirizine hcl) .... Take one tablet by mouth daily 11)  Venlafaxine Hcl 50 Mg Tabs (Venlafaxine hcl) .... Take one talbet by mouth at bedtime 12)  Amitriptyline Hcl 100 Mg Tabs (Amitriptyline hcl) .... Take 1 tablet by mouth at bedtime 13)  Metformin Hcl 850 Mg Tabs (Metformin hcl) .... Take 1 tablet by mouth  two times a day 14)  Glipizide 10 Mg Tb24 (Glipizide) .... Take 1 tablet by mouth two times a day 15)  Ms Contin 60 Mg Xr12h-tab (Morphine sulfate) .... Take 1 tablet by mouth two times a day 16)  Morphine Sulfate 15 Mg Tabs (Morphine sulfate) .... Take one pill by mouth three times a day as needed for pain  Other Orders: Flu Vaccine 5yrs + MEDICARE PATIENTS (N5621) Administration Flu vaccine - MCR (G0008) T-Drug Screen-Urine, (single) ([id number])  Patient Instructions: 1)  Please schedule a follow-up appointment in 1 month.   Orders Added: 1)  Flu Vaccine 40yrs + MEDICARE PATIENTS [Q2039] 2)  Administration Flu vaccine - MCR [G0008] 3)  T-Drug Screen-Urine, (single) [[id number]] 4)  Est. Patient Level IV [62952]  Flu Vaccine Consent Questions     Do you have a history of severe allergic reactions to this vaccine? no    Any prior history of allergic reactions to egg and/or gelatin? no    Do you have a sensitivity to the preservative Thimersol? no    Do you have a past history of Guillan-Barre Syndrome? no    Do you currently have an acute febrile illness? no    Have you ever had a severe reaction to latex? no    Vaccine information given and explained to patient? yes    Are you currently pregnant? no  Lot Number:[id number]   Exp Date:12/21/2010   Manufacturer: [employer]    Site Given  right Deltoid IM.Cynda Familia [facility])  April 08, 2010 3:33 PM Process Orders Check Orders Results:     [facility]: Check successful Tests Sent for requisitioning (April 10, 2010 12:10 PM):     04/08/2010: [facility] -- T-Drug Screen-Urine, (single) [phone] (signed)     Process Orders Check Orders Results:     [facility]: Check successful Tests Sent for requisitioning (April 10, 2010 12:10 PM):     04/08/2010: [facility] -- T-Drug Screen-Urine, (single) [[id number]] (signed)   .medflu   Appended  Document: EST-MED CLEAR FOR DENTAL EXTRACTIONS/[facility] Office note faxed to Dr Ocie Doyne for medical clearance for dental extractions

## 2010-07-25 NOTE — Medication Information (Signed)
Summary: MS CONTIN  MS CONTIN   Imported By: Margie Billet 06/24/2010 13:50:47  _____________________________________________________________________  External Attachment:    Type:   Image     Comment:   External Document

## 2010-07-25 NOTE — Letter (Signed)
Summary: Previsit letter  [facility]  [address] [place], [place] [address]   Phone: [phone]  Fax: [phone]       09/07/2009 MRN: [id number]  Jonathon Galvan [address] [place], [place]  [address]  Dear [name],  Welcome to the Gastroenterology Division at [facility].    You are scheduled to see a nurse for your pre-procedure visit on 09-18-09 at 1:00p.m. on the 3rd floor at [facility], [address]  We ask that you try to arrive at our office 15 minutes prior to your appointment time to allow for check-in.  Your nurse visit will consist of discussing your medical and surgical history, your immediate family medical history, and your medications.    Please bring a complete list of all your medications or, if you prefer, bring the medication bottles and we will list them.  We will need to be aware of both prescribed and over the counter drugs.  We will need to know exact dosage information as well.  If you are on blood thinners (Coumadin, Plavix, Aggrenox, Ticlid, etc.) please call our office today/prior to your appointment, as we need to consult with your physician about holding your medication.   Please be prepared to read and sign documents such as consent forms, a financial agreement, and acknowledgement forms.  If necessary, and with your consent, a friend or relative is welcome to sit-in on the nurse visit with you.  Please bring your insurance card so that we may make a copy of it.  If your insurance requires a referral to see a specialist, please bring your referral form from your primary care physician.  No co-pay is required for this nurse visit.     If you cannot keep your appointment, please call [phone] to cancel or reschedule prior to your appointment date.  This allows [place] the opportunity to schedule an appointment for another patient in need of care.    Thank you for choosing [facility] for your  medical needs.  We appreciate the opportunity to care for you.  Please visit [place] at our website  to learn more about our practice.                     Sincerely.                                                                                                                   The Gastroenterology Division

## 2010-07-25 NOTE — Letter (Signed)
Summary: Office Visit Letter  [facility]  [address] [place], [place] [address]   Phone: [phone]  Fax: [phone]      December 18, 2009 MRN: [id number]   RAYNARD MAPPS [address] [place], [place]  [address]   Dear Mr. Andreen,   According to our records, it is time for you to schedule a follow-up office visit with [place].   At your convenience, please call [phone] (option #2)to schedule an office visit. If you have any questions, concerns, or feel that this letter is in error, we would appreciate your call.   Sincerely,    Iva Boop, M.D.  [facility] [phone]

## 2010-07-25 NOTE — Progress Notes (Signed)
Summary: Niaspan PA//kg  Phone Note Outgoing Call Call back at [phone]   Call placed by: Cynda Familia [facility] Hights),  March 28, 2010 4:47 PM Call placed to: [facility] Summary of Call: [facility] after receiving a prior authorization for Niaspan 500mg  .  Drug is not on the preferred list, they recommend rx for Niacor.  If you agree, please put in rx.  If you feel pt can only take the Niaspan, then I will have the insurance company fax form for you to complete to get it approved.  Please advise Initial call taken by: Cynda Familia Duncan Dull),  March 28, 2010 5:09 PM  Follow-up for Phone Call        The drug is listed as Niacor on the EMR. I am ok with trying Niacor first before going to Niaspan.  Thanks Follow-up by: Lars Mage MD,  March 29, 2010 12:08 PM  Additional Follow-up for Phone Call Additional follow up Details #1::        Contacted [facility]-they had Niaspan on file and that's why rx would not go through.  Rx refilled as Niacor 500mg  tabs once daily by Dr Eben Burow on 10/03/11with 11 refills and that is what I called into pharmacy. They will contact the office should additional info needed, phone call complete Additional Follow-up by: Cynda Familia Duncan Dull),  March 29, 2010 12:24 PM

## 2010-07-25 NOTE — Miscellaneous (Signed)
  Clinical Lists Changes  Medications: Rx of COREG 25 MG TABS (CARVEDILOL) take one tablet by mouth twice daily;  #60 x 11;  Signed;  Entered by: Lars Mage MD;  Authorized by: Lars Mage MD;  Method used: Faxed to [facility], [address] [place], [place], [place]  [address], Ph: [phone], Fax: [phone]    Prescriptions: COREG 25 MG TABS (CARVEDILOL) take one tablet by mouth twice daily  #60 x 11   Entered and Authorized by:   Lars Mage MD   Signed by:   Lars Mage MD on 01/25/2010   Method used:   Faxed to ...       [facility] (retail)       [address]       [place], [place]  [address]       Ph: [phone]       Fax: [phone]   RxID:   [id number]

## 2010-07-25 NOTE — Procedures (Signed)
Summary: Colonoscopy  Patient: Jonathon Galvan Note: All result statuses are Final unless otherwise noted.  Tests: (1) Colonoscopy (COL)   COL Colonoscopy           DONE      [facility]     [address]     [place], [place]  [address]           COLONOSCOPY PROCEDURE REPORT           PATIENT:  [name], [name]  MR#:  [id number]     BIRTHDATE:  Oct 28, 1945, 63 yrs. old  GENDER:  male     ENDOSCOPIST:  Iva Boop, MD, [facility]     REF. BY:          Brooks Sailors, MD     PROCEDURE DATE:  10/23/2009     PROCEDURE:  Colonoscopy [id number]     ASA CLASS:  Class III     INDICATIONS:  Routine Risk Screening     MEDICATIONS:   Fentanyl 50 mcg IV, Versed 7 mg IV           DESCRIPTION OF PROCEDURE:   After the risks benefits and     alternatives of the procedure were thoroughly explained, informed     consent was obtained.  Digital rectal exam was performed and     revealed no abnormalities.   The LB CF-[id number] endoscope     was introduced through the anus and advanced to the cecum, which     was identified by the ileocecal valve, limited by poor     preparation.    The quality of the prep was poor, using MoviPrep.     The instrument was then slowly withdrawn as the colon was fully     examined.     <<PROCEDUREIMAGES>>     FINDINGS:  incomplete exam throughout the colon. Prep was poor. No     large lesions seen but not an adequate screening exam.     Retroflexed views in the rectum revealed no abnormalities.    The     scope was then withdrawn from the patient and the procedure comp     leted.           COMPLICATIONS:  None     ENDOSCOPIC IMPRESSION:     1) Incomplete exam throughout the colon - poor prep           RECOMMENDATIONS:     He will need to return to my office (visit) and we will discuss     other prep options to allow for better preparation and     visualization of the colon. He should see me in the next 2-3     months.     resume Plavix now.           REPEAT  EXAM:  In 3 month(s) for Colonoscopy.           Iva Boop, MD, [facility]           CC:  The Patient     Brooks Sailors, MD           n.     Rosalie Doctor:   Iva Boop at 10/23/2009 03:09 PM           Colman Cater, [id number]  Note: An exclamation mark (!) indicates a result that was not dispersed into the flowsheet. Document Creation Date: 10/23/2009 3:10 PM _______________________________________________________________________  (1) Order result status: Final Collection or  observation date-time: 10/23/2009 14:54 Requested date-time:  Receipt date-time:  Reported date-time:  Referring Physician:   Ordering Physician: Stan Head [id number]) Specimen Source:  Source: Launa Grill Order Number: [id number] Lab site:   Appended Document: Colonoscopy     Procedures Next Due Date:    Colonoscopy: 01/2010

## 2010-07-25 NOTE — Progress Notes (Signed)
Summary: refill/ hla  Phone Note Refill Request Message from:  Fax from Pharmacy on May 21, 2010 5:29 PM  Refills Requested: Medication #1:  AMITRIPTYLINE HCL 100 MG TABS Take 1 tablet by mouth at bedtime   Dosage confirmed as above?Dosage Confirmed   Brand Name Necessary? No   Supply Requested: 1 year  Medication #2:  NORVASC 10 MG TABS take one tablet by mouth daily   Dosage confirmed as above?Dosage Confirmed   Brand Name Necessary? No   Supply Requested: 1 year  Medication #3:  ZYRTEC 10 MG TABS take one tablet by mouth daily   Dosage confirmed as above?Dosage Confirmed   Brand Name Necessary? No   Supply Requested: 1 year  Medication #4:  ENALAPRIL MALEATE 20 MG TABS take one tablet by mouth twice daily.   Dosage confirmed as above?Dosage Confirmed   Brand Name Necessary? No   Supply Requested: 1 year page 1  Initial call taken by: Marin Roberts RN,  May 21, 2010 5:29 PM  Follow-up for Phone Call        Rx faxed to pharmacy Follow-up by: Lars Mage MD,  May 24, 2010 12:23 PM    Prescriptions: AMITRIPTYLINE HCL 100 MG TABS (AMITRIPTYLINE HCL) Take 1 tablet by mouth at bedtime  #30 x 11   Entered and Authorized by:   Lars Mage MD   Signed by:   Lars Mage MD on 05/24/2010   Method used:   Faxed to ...       [facility] (retail)       [address]       [place], [place]  [address]       Ph: [phone]       Fax: [phone]   RxID:   [id number] ZYRTEC 10 MG TABS (CETIRIZINE HCL) take one tablet by mouth daily  #30 x 11   Entered and Authorized by:   Lars Mage MD   Signed by:   Lars Mage MD on 05/24/2010   Method used:   Faxed to ...       [facility] (retail)       [address]       [place], [place]  [address]       Ph: [phone]       Fax: [phone]   RxID:   [id number] NORVASC 10 MG TABS (AMLODIPINE BESYLATE) take one tablet by mouth daily  #30 x 11  Entered and Authorized by:   Lars Mage MD   Signed by:   Lars Mage MD on 05/24/2010   Method used:   Faxed to ...       [facility] (retail)       [address]       [place], [place]  [address]       Ph: [phone]       Fax: [phone]   RxID:   [id number] ENALAPRIL MALEATE 20 MG TABS (ENALAPRIL MALEATE) take one tablet by mouth twice daily.  #60 x 11   Entered and Authorized by:   Lars Mage MD   Signed by:   Lars Mage MD on 05/24/2010   Method used:   Faxed to ...       [facility] (retail)       [address]       [place], [place]  [address]       Ph: [phone]       Fax: [phone]   RxID:   [id number]

## 2010-07-25 NOTE — Assessment & Plan Note (Signed)
Summary: screening colon age-/pt on Plavix/yf   History of Present Illness Visit Type: Initial Consult Primary GI MD: Stan Head MD [facility] Primary Provider: Olene Craven MD Requesting Provider: Janeth Rase Chief Complaint: Screening colonoscopy, Pt has No GI complaints History of Present Illness:   65 yo African-american man. Has never had a screening colonoscopy. He is on Plavix for CAD and PVD> No active GI symptoms or signs. No angina and no claudication reported.   GI Review of Systems      Denies abdominal pain, acid reflux, belching, bloating, chest pain, dysphagia with liquids, dysphagia with solids, heartburn, loss of appetite, nausea, vomiting, vomiting blood, weight loss, and  weight gain.        Denies anal fissure, black tarry stools, change in bowel habit, constipation, diarrhea, diverticulosis, fecal incontinence, heme positive stool, hemorrhoids, irritable bowel syndrome, jaundice, light color stool, liver problems, rectal bleeding, and  rectal pain.    Current Medications (verified): 1)  Plavix 75 Mg Tabs (Clopidogrel Bisulfate) .... Take One Tablet By Mouth Daily 2)  Enalapril Maleate 20 Mg Tabs (Enalapril Maleate) .... Take One Tablet By Mouth Daily 3)  Norvasc 10 Mg Tabs (Amlodipine Besylate) .... Take One Tablet By Mouth Daily 4)  Lipitor 10 Mg Tabs (Atorvastatin Calcium) .... Take One Tablet  By Mouth Daily 5)  Niacor 500 Mg Tabs (Niacin) .... Take 1 Tablet By Mouth Once A Day 6)  Coreg 25 Mg Tabs (Carvedilol) .... Take One Tablet By Mouth Twice Daily 7)  Ambien 10 Mg Tabs (Zolpidem Tartrate) .... Take One Tablet By Mouth At Bedtime 8)  Neurontin 300 Mg Caps (Gabapentin) .... Take One Tablet By Mouth Three Times A Day 9)  Lasix 40 Mg Tabs (Furosemide) .... Take One Tablet By Mouth Daily 10)  Zyrtec 10 Mg Tabs (Cetirizine Hcl) .... Take One Tablet By Mouth Daily 11)  Venlafaxine Hcl 50 Mg Tabs (Venlafaxine Hcl) .... Take One Talbet By Mouth At Bedtime 12)   Amitriptyline Hcl 100 Mg Tabs (Amitriptyline Hcl) .... Take 1 Tablet By Mouth At Bedtime 13)  Metformin Hcl 850 Mg Tabs (Metformin Hcl) .... Take 1 Tablet By Mouth Two Times A Day 14)  Glipizide 10 Mg  Tb24 (Glipizide) .... Take 1 Tablet By Mouth Two Times A Day 15)  Ms Contin 60 Mg Xr12h-Tab (Morphine Sulfate) .... Take 1 Tablet By Mouth Two Times A Day 16)  Hydrocodone-Acetaminophen 10-325 Mg Tabs (Hydrocodone-Acetaminophen) .... 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 Riley Kill for pain mgmt  (painful stump) HEPATITIS C    - doc'd 12/2002 CHOLELITHIASIS - documented in 07/2007 on abdominal CT scan COCAINE USE    - s/p cardiopulmonary arrest 12/25/2002 ([facility])    - 2D echo: 20-25% LVEF, cardiac cath: 25-30% mid RCA stenosis, LVEF 40%, EPS study: LBBB but      otherwise normal Allergic rhinitis THROMBOCYTOPENIA    -  07/2007 while hospitalized. Went down to 90. Thought to be 2/2 Neurontin.  Past Surgical History: s/p L AKA 04/12/04 Cyst removel on left shoulder  Family History: Mother died of an MI in her 36s.  Father died from complications of cirrhosis. Brother had a fatal MI. No FH of Colon Cancer:  Social History: Tobacco: almost 1 PPD x 50 yrs.  Now smokes 5-7 cigarettes/day. Had 5 children, now 45. 28 y/o dgtr died 2008-04-12 from ESRD. His sister constantly checks in on him and comes with him to all his apptmts. Pt has an  aide to help him during the day. Alcohol- occ. 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 Daily Caffeine Use  occ  Review of Systems       no dyspnea or chest pain ambulates with walker   Vital Signs:  Patient profile:   65 year old male Height:      69.5 inches Weight:      217 pounds BMI:     31.70 BSA:     2.15 Pulse rate:   66 / minute Pulse rhythm:   regular BP sitting:   122 / 78  Vitals Entered By: Merri Ray CMA Duncan Dull) (September 20, 2009 3:05 PM)  Physical Exam  General:  alert, well-developed, well-nourished, and well-hydrated.   Eyes:  anicteric Mouth:  upper dentures some lower teeth remain clear Lungs:  Clear throughout to auscultation. Heart:  normal rate, regular rhythm, no gallop, no rub, and no JVD.  2/6 SEM Abdomen:  soft, non-tender, normal bowel sounds, no distention, no masses, no guarding, no rigidity, and no rebound tenderness.   Extremities:  Left AKA, trace edema in R LE. Neurologic:  alert & oriented X3    Impression & Recommendations:  Problem # 1:  SCREENING COLORECTAL-CANCER (ICD-V76.51) Risks, benefits,and indications of endoscopic procedure(s) were reviewed with the patient and all questions answered.  Problem # 2:  PREOPERATIVE VISIT (ICD-V72.84)  Problem # 3:  PVD (ICD-443.9) Assessment: Unchanged on Plavix for this and CAD should be reasonable to temporarily hold Plavix will check with PCP  Patient Instructions: 1)  Please pick up your medications at your pharmacy. MOVIPREP 2)  We will see you at your procedure on 10/10/09. 3)  [facility] Patient Information Guide given to patient.  4)  Colonoscopy and Flexible Sigmoidoscopy brochure given.  5)  Copy sent to : Brooks Sailors, MD 6)  The medication list was reviewed and reconciled.  All changed / newly prescribed medications were explained.  A complete medication list was provided to the patient /  caregiver. Prescriptions: MOVIPREP 100 GM  SOLR (PEG-KCL-NACL-NASULF-NA ASC-C) As per prep instructions.  #1 x 0   Entered by:   Francee Piccolo CMA (AAMA)   Authorized by:   Iva Boop MD, [facility]   Signed by:   Francee Piccolo CMA (AAMA) on 09/20/2009   Method used:   Faxed to ...       [facility] (retail)       [address]       [place], [place]  [address]       Ph: [phone]       Fax: [phone]   RxID:   [id number]   Appended Document: Orders Update    Clinical Lists Changes  Orders: Added new Test order of Colonoscopy (Colon) - Signed

## 2010-07-25 NOTE — Medication Information (Signed)
Summary: Tax adviser   Imported By: Florinda Marker 07/23/2009 10:44:11  _____________________________________________________________________  External Attachment:    Type:   Image     Comment:   External Document

## 2010-07-25 NOTE — Progress Notes (Signed)
Summary: niaspan PA/ hla  Phone Note Outgoing Call   Summary of Call: prior auth initiated for niaspan, will be 72 hrs from 1600  today 2/24 Initial call taken by: Marin Roberts RN,  August 16, 2009 5:24 PM

## 2010-07-25 NOTE — Assessment & Plan Note (Signed)
Summary: EST-2 WEEK RECHECK/CH   Vital Signs:  Patient profile:   65 year old male Height:      69.5 inches (176.53 cm) Weight:      222.0 pounds (100.91 kg) BMI:     32.43 Temp:     97.3 degrees F (36.28 degrees C) oral Pulse rate:   61 / minute BP sitting:   127 / 66  (left arm)  Vitals Entered By: Cynda Familia Duncan Dull) (April 29, 2010 2:57 PM) CC: f/u right lower leg wound Is Patient Diabetic? Yes Pain Assessment Patient in pain? no      Nutritional Status BMI of > 30 = obese  Does patient need assistance? Functional Status Cook/clean Ambulation Impaired:Risk for fall, Wheelchair   Primary Care Provider:  Lars Mage MD  CC:  f/u right lower leg wound.  History of Present Illness: Jonathon Galvan is here today for follow up for his right leg edema.  He has completed his course of antibiotics and has not had any pain or wierd feeling in his right leg lately. His dopplers studies were negative.  CHF; patient does have significant h/o heart failure with EF 30-35% from may 2011. he hasnt seen any cardiologist.  HTN: Well controlled.  Chronic pain: Well controlled.  No other complaints today.  Will give him tetanus today.  Preventive Screening-Counseling & Management  Alcohol-Tobacco     Alcohol drinks/day: 0     Smoking Status: current     Smoking Cessation Counseling: yes     Packs/Day: <0.25     Year Started: 1990  Problems Prior to Update: 1)  Leg Edema  (ICD-782.3) 2)  CHF  (ICD-428.0) 3)  Hypertension  (ICD-401.9) 4)  Diabetes Mellitus, Type II  (ICD-250.00) 5)  Dyslipidemia  (ICD-272.4) 6)  Lbbb  (ICD-426.3) 7)  Pvd  (ICD-443.9) 8)  Cocaine Abuse  (ICD-305.60) 9)  Tobacco Use  (ICD-305.1) 10)  Anemia  (ICD-285.9) 11)  Thrombocytopenia  (ICD-287.5) 12)  Complication, Amputation Stump Nos  (ICD-997.60) 13)  Hepatitis C  (ICD-070.51) 14)  Asthma  (ICD-493.90) 15)  Allergic Rhinitis  (ICD-477.9)  Medications Prior to Update: 1)  Plavix 75 Mg  Tabs (Clopidogrel Bisulfate) .... Take One Tablet By Mouth Daily 2)  Enalapril Maleate 20 Mg Tabs (Enalapril Maleate) .... Take One Tablet By Mouth Twice Daily. 3)  Norvasc 10 Mg Tabs (Amlodipine Besylate) .... Take One Tablet By Mouth Daily 4)  Lipitor 10 Mg Tabs (Atorvastatin Calcium) .... Take One Tablet  By Mouth Daily 5)  Niacor 500 Mg Tabs (Niacin) .... Take 1 Tablet By Mouth Once A Day 6)  Coreg 25 Mg Tabs (Carvedilol) .... Take One Tablet By Mouth Twice Daily 7)  Ambien 10 Mg Tabs (Zolpidem Tartrate) .... Take One Tablet By Mouth At Bedtime 8)  Neurontin 300 Mg Caps (Gabapentin) .... Take One Tablet By Mouth Three Times A Day 9)  Lasix 40 Mg Tabs (Furosemide) .... Take One Tablet By Mouth Three Times A Day 10)  Zyrtec 10 Mg Tabs (Cetirizine Hcl) .... Take One Tablet By Mouth Daily 11)  Venlafaxine Hcl 50 Mg Tabs (Venlafaxine Hcl) .... Take One Talbet By Mouth At Bedtime 12)  Amitriptyline Hcl 100 Mg Tabs (Amitriptyline Hcl) .... Take 1 Tablet By Mouth At Bedtime 13)  Metformin Hcl 850 Mg Tabs (Metformin Hcl) .... Take 1 Tablet By Mouth Two Times A Day 14)  Glipizide 10 Mg  Tb24 (Glipizide) .... Take 1 Tablet By Mouth Two Times A Day 15)  Ms Contin 52 Mg Xr12h-Tab (Morphine Sulfate) .... Take 1 Tablet By Mouth Two Times A Day 16)  Morphine Sulfate 15 Mg Tabs (Morphine Sulfate) .... Take One Pill By Mouth Three Times A Day As Needed For Pain 17)  Doxycycline Hyclate 100 Mg Tabs (Doxycycline Hyclate) .... Take 1 Tablet Every 12 Hours  Current Medications (verified): 1)  Plavix 75 Mg Tabs (Clopidogrel Bisulfate) .... Take One Tablet By Mouth Daily 2)  Enalapril Maleate 20 Mg Tabs (Enalapril Maleate) .... Take One Tablet By Mouth Twice Daily. 3)  Norvasc 10 Mg Tabs (Amlodipine Besylate) .... Take One Tablet By Mouth Daily 4)  Lipitor 10 Mg Tabs (Atorvastatin Calcium) .... Take One Tablet  By Mouth Daily 5)  Niacor 500 Mg Tabs (Niacin) .... Take 1 Tablet By Mouth Once A Day 6)  Coreg 25 Mg  Tabs (Carvedilol) .... Take One Tablet By Mouth Twice Daily 7)  Ambien 10 Mg Tabs (Zolpidem Tartrate) .... Take One Tablet By Mouth At Bedtime 8)  Neurontin 300 Mg Caps (Gabapentin) .... Take One Tablet By Mouth Three Times A Day 9)  Lasix 40 Mg Tabs (Furosemide) .... Take One Tablet By Mouth Three Times A Day 10)  Zyrtec 10 Mg Tabs (Cetirizine Hcl) .... Take One Tablet By Mouth Daily 11)  Venlafaxine Hcl 50 Mg Tabs (Venlafaxine Hcl) .... Take One Talbet By Mouth At Bedtime 12)  Amitriptyline Hcl 100 Mg Tabs (Amitriptyline Hcl) .... Take 1 Tablet By Mouth At Bedtime 13)  Metformin Hcl 850 Mg Tabs (Metformin Hcl) .... Take 1 Tablet By Mouth Two Times A Day 14)  Glipizide 10 Mg  Tb24 (Glipizide) .... Take 1 Tablet By Mouth Two Times A Day 15)  Ms Contin 60 Mg Xr12h-Tab (Morphine Sulfate) .... Take 1 Tablet By Mouth Two Times A Day 16)  Morphine Sulfate 15 Mg Tabs (Morphine Sulfate) .... 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 Riley Kill for pain mgmt  (painful stump) HEPATITIS C    - doc'd 12/2002 CHOLELITHIASIS - documented in 07/2007 on abdominal CT scan COCAINE USE    - s/p cardiopulmonary arrest 12/25/2002 [name])    - 2D echo: 20-25% LVEF, cardiac cath: 25-30% mid RCA stenosis, LVEF 40%, EPS study: LBBB but      otherwise normal Allergic rhinitis THROMBOCYTOPENIA    - 07/2007 while  hospitalized. Went down to 90. Thought to be 2/2 Neurontin.  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  Father died from complications of cirrhosis. Brother had a fatal MI. No FH of Colon Cancer:  Social History: Last updated: 2009-10-13 Tobacco: almost 1 PPD x 50 yrs.  Now smokes 5-7 cigarettes/day. Had 5 children, now 3.  89 y/o dgtr died 04/12/08 from ESRD. His sister constantly checks in on him and comes with him to all his apptmts. Pt has an aide to help him during the day. Alcohol- occ. 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 Daily Caffeine Use  occ  Risk Factors: Alcohol Use: 0 (04/29/2010) Exercise: no (04/11/2010)  Risk Factors: Smoking Status: current (04/29/2010) Packs/Day: <0.25 (04/29/2010)  Family History: Reviewed history from 09/20/2009 and no changes required. Mother died of an MI in her 57s.  Father died from complications of cirrhosis. Brother had a fatal MI. No FH of Colon Cancer:  Social History: Reviewed history from 09/20/2009 and no changes required. Tobacco: almost 1 PPD x 50 yrs.  Now smokes 5-7 cigarettes/day. Had 5 children, now 44. 83 y/o dgtr died 12-Apr-2008 from ESRD. His sister constantly checks in on him and comes with him to all his apptmts. Pt has an aide to help him during the day. Alcohol- occ. 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 Daily Caffeine Use  occ  Review of Systems      See HPI  Physical Exam  Additional Exam:  Gen: AOx3, in no acute distress, sitting comfortably in wheelchair. Eyes: PERRL, EOMI ENT:MMM, No erythema noted in posterior pharynx Neck: No JVD, No LAP Chest: CTAB with  good respiratory effort CVS: regular rhythmic rate, NO M/R/G, S1 S2 normal Abdo: soft,ND, BS+x4, Non tender and No  hepatosplenomegaly EXT: trace edema noted on right leg until knee, few ulcers noted in healed stage noted over right shin,left AKA,  Ext: 1+ pulses tibial and DP Neuro: Non focal, gait is normal Skin: no rashes noted.    Impression & Recommendations:  Problem # 1:  CHF (ICD-428.0) Assessment Unchanged He will benefit from a cardiology consult at this point as he is good candidate for ICD placement. Already maxed on his ace's and BB therapy. If he still continues to have edema, will go up on lasix. His updated medication list for this problem includes:    Plavix 75 Mg Tabs (Clopidogrel bisulfate) .Marland Kitchen... Take one tablet by mouth daily    Enalapril Maleate 20 Mg Tabs (Enalapril maleate) .Marland Kitchen... Take one tablet by mouth twice daily.    Coreg 25 Mg Tabs (Carvedilol) .Marland Kitchen... Take one tablet by mouth twice daily    Lasix 40 Mg Tabs (Furosemide) .Marland Kitchen... Take one tablet by mouth three times a day  Orders: T-Basic Metabolic Panel [phone]) Cardiology Referral (Cardiology)  LVEF: 20 (12/02/2005)  Echocardiogram: 4/11   Study Conclusions    - Left ventricle: Systolic function was moderately to severely     reduced. The estimated ejection fraction was in the range of 30%     to 35%. Features are consistent with a pseudonormal left     ventricular filling pattern, with concomitant abnormal relaxation     and increased filling pressure (grade 2 diastolic dysfunction).     Doppler parameters are consistent with elevated mean left atrial     filling pressure.   - Ventricular septum: Septal motion showed severe dyssynergy and     paradox. These changes are consistent with a left bundle branch     block.   - Mitral valve: Calcified annulus. Mild regurgitation.   - Left atrium: The atrium was moderately dilated.   Transthoracic echocardiography. M-mode, complete 2D, spectral   Doppler, and color Doppler. Height: Height: 176.5cm. Height: 69.5in.   Weight: Weight:  84.5kg. Weight: 186lb. Body mass  index: BMI:   27.1kg/m^2. Body surface area: BSA: 2.20m^2. Blood pressure: 130/90.   Patient status: Outpatient. Location: Echo laboratory.  (10/09/2009)  Problem # 2:  HYPERTENSION (ICD-401.9) Assessment: Unchanged at goal. His updated medication list for this problem includes:    Enalapril Maleate 20 Mg Tabs (Enalapril maleate) .Marland Kitchen... Take one tablet by mouth twice daily.    Norvasc 10 Mg Tabs (Amlodipine besylate) .Marland Kitchen... Take one tablet by mouth daily    Coreg 25 Mg Tabs (Carvedilol) .Marland Kitchen... Take one tablet by mouth twice daily    Lasix 40 Mg Tabs (Furosemide) .Marland Kitchen... Take one tablet by mouth three times a day  BP today: 127/66 Prior BP: 125/71 (04/11/2010)  Prior 10 Yr Risk Heart Disease: Not enough information (02/02/2007)  Labs Reviewed: K+: 3.6 (03/04/2010) Creat: : 1.02 (03/04/2010)   Chol: 115 (07/07/2008)   HDL: 31 (07/07/2008)   LDL: 52 (07/07/2008)   TG: 162 (07/07/2008)  Problem # 3:  DIABETES MELLITUS, TYPE II (ICD-250.00) Assessment: Improved Working towards the goal.  Will check urine microalbumin ratio today. Foot exam is recent. BP at goal. His updated medication list for this problem includes:    Enalapril Maleate 20 Mg Tabs (Enalapril maleate) .Marland Kitchen... Take one tablet by mouth twice daily.    Metformin Hcl 850 Mg Tabs (Metformin hcl) .Marland Kitchen... Take 1 tablet by mouth two times a day    Glipizide 10 Mg Tb24 (Glipizide) .Marland Kitchen... Take 1 tablet by mouth two times a day  Orders: T-Urine Microalbumin w/creat. ratio [id number] [phone])  Labs Reviewed: Creat: 1.02 (03/04/2010)    Reviewed HgBA1c results: 7.5 (03/04/2010)  7.4 (09/07/2009)  Problem # 4:  DYSLIPIDEMIA (ICD-272.4) Assessment: Unchanged Due for Lipid profile. will check it fasting next time. His updated medication list for this problem includes:    Lipitor 10 Mg Tabs (Atorvastatin calcium) .Marland Kitchen... Take one tablet  by mouth daily    Niacor 500 Mg Tabs (Niacin) .Marland Kitchen... Take 1 tablet by mouth once a day  Labs  Reviewed: SGOT: 42 (03/04/2010)   SGPT: 31 (03/04/2010)  Prior 10 Yr Risk Heart Disease: Not enough information (02/02/2007)   HDL:31 (07/07/2008), 30 (02/15/2007)  LDL:52 (07/07/2008), 84 (02/15/2007)  Chol:115 (07/07/2008), 138 (02/15/2007)  Trig:162 (07/07/2008), 122 (02/15/2007)  Problem # 5:  TOBACCO USE (ICD-305.1) Assessment: Unchanged Patient was counseled on smoking cessation strategies including medications and behavior modification options. He denied social work consult at this time.  Problem # 6:  Preventive Health Care (ICD-V70.0) Assessment: Comment Only Tetanus shot today. Agreed to enroll into colonoscopy study.  Complete Medication List: 1)  Plavix 75 Mg Tabs (Clopidogrel bisulfate) .... Take one tablet by mouth daily 2)  Enalapril Maleate 20 Mg Tabs (Enalapril maleate) .... Take one tablet by mouth twice daily. 3)  Norvasc 10 Mg Tabs (Amlodipine besylate) .... Take one tablet by mouth daily 4)  Lipitor 10 Mg Tabs (Atorvastatin calcium) .... Take one tablet  by mouth daily 5)  Niacor 500 Mg Tabs (Niacin) .... Take 1 tablet by mouth once a day 6)  Coreg 25 Mg Tabs (Carvedilol) .... Take one tablet by mouth twice daily 7)  Ambien 10 Mg Tabs (Zolpidem tartrate) .... Take one tablet by mouth at bedtime 8)  Neurontin 300 Mg Caps (Gabapentin) .... Take one tablet by mouth three times a day 9)  Lasix 40 Mg Tabs (Furosemide) .... Take one tablet by mouth three times a day 10)  Zyrtec 10 Mg Tabs (Cetirizine hcl) .... Take one tablet  by mouth daily 11)  Venlafaxine Hcl 50 Mg Tabs (Venlafaxine hcl) .... Take one talbet by mouth at bedtime 12)  Amitriptyline Hcl 100 Mg Tabs (Amitriptyline hcl) .... Take 1 tablet by mouth at bedtime 13)  Metformin Hcl 850 Mg Tabs (Metformin hcl) .... Take 1 tablet by mouth two times a day 14)  Glipizide 10 Mg Tb24 (Glipizide) .... Take 1 tablet by mouth two times a day 15)  Ms Contin 60 Mg Xr12h-tab (Morphine sulfate) .... Take 1 tablet by mouth two  times a day 16)  Morphine Sulfate 15 Mg Tabs (Morphine sulfate) .... Take one pill by mouth three times a day as needed for pain  Other Orders: Tdap => 61yrs IM (16109) Admin 1st Vaccine (60454)  Patient Instructions: 1)  Follow up as needed. 2)  Please weigh your self everyday and if your weight is up by more then 5 pounds, report to your PCP or make an appointment. 3)  Please be complaint with salt restricted diet. 4)  Tobacco is very bad for your health and your loved ones! You Should stop smoking!. 5)  Stop Smoking Tips: Choose a Quit date. Cut down before the Quit date. decide what you will do as a substitute when you feel the urge to smoke(gum,toothpick,exercise).   Orders Added: 1)  T-Urine Microalbumin w/creat. ratio [[id number]] 2)  T-Basic Metabolic Panel [[id number]] 3)  Est. Patient Level III [09811] 4)  Cardiology Referral [Cardiology] 5)  Tdap => 69yrs IM [90715] 6)  Admin 1st Vaccine [91478]   Immunizations Administered:  Tetanus Vaccine:    Vaccine Type: Tdap    Site: left deltoid    Mfr: GlaxoSmithKline    Dose: 0.5 ml    Route: IM    Given by: Cynda Familia (AAMA)    Exp. Date: 04/11/2012    Lot #: [id number]    VIS given: 05/10/08 version given April 29, 2010.   Immunizations Administered:  Tetanus Vaccine:    Vaccine Type: Tdap    Site: left deltoid    Mfr: GlaxoSmithKline    Dose: 0.5 ml    Route: IM    Given by: Cynda Familia (AAMA)    Exp. Date: 04/11/2012    Lot #: [id number]    VIS given: 05/10/08 version given April 29, 2010. Process Orders Check Orders Results:     [facility]: Check successful Tests Sent for requisitioning (April 29, 2010 6:01 PM):     04/29/2010: [facility] -- T-Urine Microalbumin w/creat. ratio [[id number]] (signed)     04/29/2010: [facility] -- T-Basic Metabolic Panel [phone] (signed)     Prevention & Chronic  Care Immunizations   Influenza vaccine: Fluvax 3+  (04/08/2010)   Influenza vaccine deferral: Deferred  (10/02/2009)    Tetanus booster: 04/29/2010: Tdap   Td booster deferral: Not indicated  (10/09/2009)    Pneumococcal vaccine: Pneumovax  (10/02/2009)    H. zoster vaccine: Not documented   H. zoster vaccine deferral: Not indicated  (10/09/2009)  Colorectal Screening   Hemoccult: Not documented   Hemoccult action/deferral: Refused  (10/09/2009)    Colonoscopy: DONE  (10/23/2009)   Colonoscopy action/deferral: Refused  (10/09/2009)   Colonoscopy due: 01/2010  Other Screening   PSA: 0.10  (10/02/2009)   PSA action/deferral: Discussed-decision deferred  (10/02/2009)   Smoking status: current  (04/29/2010)   Smoking cessation counseling: yes  (04/29/2010)  Diabetes Mellitus   HgbA1C: 7.5  (03/04/2010)    Eye exam: Not documented  Diabetic eye exam action/deferral: Deferred  (10/09/2009)    Foot exam: yes  (03/04/2010)   Foot exam action/deferral: Do today   High risk foot: Yes  (03/04/2010)   Foot care education: Done  (03/04/2010)    Urine microalbumin/creatinine ratio: 419.9  (04/26/2009)   Urine microalbumin action/deferral: Ordered    Diabetes flowsheet reviewed?: Yes   Progress toward A1C goal: At goal  Lipids   Total Cholesterol: 115  (07/07/2008)   Lipid panel action/deferral: Lipid Panel ordered   LDL: 52  (07/07/2008)   LDL Direct: Not documented   HDL: 31  (07/07/2008)   Triglycerides: 162  (07/07/2008)    SGOT (AST): 42  (03/04/2010)   SGPT (ALT): 31  (03/04/2010)   Alkaline phosphatase: 72  (03/04/2010)   Total bilirubin: 0.5  (03/04/2010)    Lipid flowsheet reviewed?: Yes   Progress toward LDL goal: At goal  Hypertension   Last Blood Pressure: 127 / 66  (04/29/2010)   Serum creatinine: 1.02  (03/04/2010)   BMP action: Ordered   Serum potassium 3.6  (03/04/2010)    Hypertension flowsheet reviewed?: Yes   Progress toward BP goal: At  goal  Self-Management Support :   Personal Goals (by the next clinic visit) :     Personal A1C goal: 7  (10/02/2009)     Personal blood pressure goal: 130/80  (10/02/2009)     Personal LDL goal: 100  (10/02/2009)    Diabetes self-management support: Written self-care plan  (04/29/2010)   Diabetes care plan printed   Last diabetes self-management training by diabetes educator: 04/08/2010   Last medical nutrition therapy: 08/26/2007    Hypertension self-management support: Written self-care plan  (04/29/2010)   Hypertension self-care plan printed.    Lipid self-management support: Written self-care plan  (04/29/2010)   Lipid self-care plan printed.   Nursing Instructions: Give tetanus booster today     Process Orders Check Orders Results:     [facility]: Check successful Tests Sent for requisitioning (April 29, 2010 6:01 PM):     04/29/2010: [facility] -- T-Urine Microalbumin w/creat. ratio [[id number]] (signed)     04/29/2010: [facility] -- T-Basic Metabolic Panel [phone] (signed)

## 2010-08-20 ENCOUNTER — Other Ambulatory Visit: Payer: Self-pay | Admitting: *Deleted

## 2010-08-20 MED ORDER — MORPHINE SULFATE 15 MG PO TABS: 15.0000 mg | ORAL_TABLET | Freq: Three times a day (TID) | ORAL | Status: DC | PRN

## 2010-08-20 MED ORDER — MORPHINE SULFATE CR 60 MG PO TB12: 60.0000 mg | ORAL_TABLET | Freq: Two times a day (BID) | ORAL | Status: DC

## 2010-08-20 NOTE — Telephone Encounter (Signed)
Please recheck there 2 Rx I put in to be sure it's what you want.

## 2010-09-02 LAB — CBC
HCT: 44.4 % (ref 39.0–52.0)
Hemoglobin: 15.1 g/dL (ref 13.0–17.0)
MCHC: 34 g/dL (ref 30.0–36.0)

## 2010-09-02 LAB — COMPREHENSIVE METABOLIC PANEL
ALT: 64 U/L — ABNORMAL HIGH (ref 0–53)
Alkaline Phosphatase: 74 U/L (ref 39–117)
CO2: 30 mEq/L (ref 19–32)
Calcium: 9.6 mg/dL (ref 8.4–10.5)
GFR calc non Af Amer: >60 mL/min (ref >60)
Glucose, Bld: 142 mg/dL — ABNORMAL HIGH (ref 70–99)
Sodium: 141 mEq/L (ref 135–145)

## 2010-09-02 LAB — URINALYSIS, ROUTINE W REFLEX MICROSCOPIC
Bilirubin Urine: NEGATIVE
Hgb urine dipstick: NEGATIVE
Nitrite: NEGATIVE
Specific Gravity, Urine: 1.011 (ref 1.005–1.030)
pH: 7.5 (ref 5.0–8.0)

## 2010-09-02 LAB — URINE MICROSCOPIC-ADD ON

## 2010-09-02 LAB — GLUCOSE, CAPILLARY: Glucose-Capillary: 137 mg/dL — ABNORMAL HIGH (ref 70–99)

## 2010-09-02 LAB — SURGICAL PCR SCREEN: MRSA, PCR: NEGATIVE

## 2010-09-03 ENCOUNTER — Encounter: Payer: Self-pay | Admitting: Internal Medicine

## 2010-09-03 ENCOUNTER — Ambulatory Visit (INDEPENDENT_AMBULATORY_CARE_PROVIDER_SITE_OTHER): Payer: MEDICARE | Admitting: Internal Medicine

## 2010-09-03 ENCOUNTER — Telehealth: Payer: Self-pay | Admitting: *Deleted

## 2010-09-03 VITALS — BP 132/74 | HR 57 | Temp 98.1°F | Ht 69.5 in | Wt 217.0 lb

## 2010-09-03 DIAGNOSIS — E119 Type 2 diabetes mellitus without complications: Secondary | ICD-10-CM

## 2010-09-03 DIAGNOSIS — E785 Hyperlipidemia, unspecified: Secondary | ICD-10-CM

## 2010-09-03 DIAGNOSIS — J309 Allergic rhinitis, unspecified: Secondary | ICD-10-CM

## 2010-09-03 DIAGNOSIS — I509 Heart failure, unspecified: Secondary | ICD-10-CM

## 2010-09-03 DIAGNOSIS — I10 Essential (primary) hypertension: Secondary | ICD-10-CM

## 2010-09-03 DIAGNOSIS — F329 Major depressive disorder, single episode, unspecified: Secondary | ICD-10-CM

## 2010-09-03 DIAGNOSIS — L02419 Cutaneous abscess of limb, unspecified: Secondary | ICD-10-CM

## 2010-09-03 DIAGNOSIS — L03115 Cellulitis of right lower limb: Secondary | ICD-10-CM

## 2010-09-03 LAB — BASIC METABOLIC PANEL
BUN: 8 mg/dL (ref 6–23)
Chloride: 103 mEq/L (ref 96–112)
Creat: 1.19 mg/dL (ref 0.40–1.50)
Glucose, Bld: 141 mg/dL — ABNORMAL HIGH (ref 70–99)

## 2010-09-03 LAB — POCT GLYCOSYLATED HEMOGLOBIN (HGB A1C): Hemoglobin A1C: 6.7

## 2010-09-03 LAB — GLUCOSE, CAPILLARY: Glucose-Capillary: 185 mg/dL — ABNORMAL HIGH (ref 70–99)

## 2010-09-03 MED ORDER — CLINDAMYCIN HCL 300 MG PO CAPS: 300.0000 mg | ORAL_CAPSULE | Freq: Three times a day (TID) | ORAL | Status: DC

## 2010-09-03 MED ORDER — FUROSEMIDE 40 MG PO TABS: 80.0000 mg | ORAL_TABLET | Freq: Two times a day (BID) | ORAL | Status: DC

## 2010-09-03 NOTE — Patient Instructions (Signed)
Please take all your medications as instructed and elevate the your right leg. Will call you if any abnormal labs.  If has fever or symptoms worse, need to return for further evaluation.  Please stop your smoking and this is bad to your health, including

## 2010-09-03 NOTE — Assessment & Plan Note (Addendum)
Lab Results  Component Value Date   NA 143 07/19/2010   K 3.5 07/19/2010   CL 104 07/19/2010   CO2 28 07/19/2010   BUN 4* 07/19/2010   CREATININE 1.14 07/19/2010    BP Readings from Last 3 Encounters:  09/03/10 132/74  05/20/10 143/80  04/29/10 127/66   BP well controlled and almost at goal. Since his right leg swelling, will increase slightly his lasix to 80 mg bid and continue other meds. Will recheck BP at next visit.

## 2010-09-03 NOTE — Telephone Encounter (Signed)
Pt called with c/o wound on rt leg.  Knot popped  yesterday and is now open area with drainage. Denies fever. Hx amputee of left leg.  Will see today

## 2010-09-03 NOTE — Progress Notes (Signed)
Subjective:    Patient ID: Jonathon Galvan, male    DOB: Jan 22, 1946, 65 y.o.   MRN: [id number]  HPI Pt is a 65 yo AAM with PMH of DM, NICM with EF 30-35%, PVD, HTN and HLD who comes here for right leg swelling, redness and warmth. This has been 3 days and usually he has no leg swelling. He tried to elevate the right leg and it is better and but after he scratched his leg and the skin was broken and started to have mild yellowish exudates. This is his first time of right leg infection. He denies fever, chest pain, shortness of breath, abdominal pain, nausea, vomiting, diarrhea, melena, dysuria or hematuria. Current smoker about 3 cigs per day, no alcohol or drug abuse. He checked his CBG daily and usually runs about 130s.     Review of Systems Per HPI.  Current Outpatient Prescriptions  Medication Sig Dispense Refill  . amitriptyline (ELAVIL) 100 MG tablet Take 100 mg by mouth at bedtime.        Marland Kitchen amLODipine (NORVASC) 10 MG tablet Take 10 mg by mouth daily.        Marland Kitchen aspirin 81 MG tablet Take 81 mg by mouth daily.        Marland Kitchen atorvastatin (LIPITOR) 10 MG tablet Take 10 mg by mouth daily.        . carvedilol (COREG) 25 MG tablet Take 25 mg by mouth 2 (two) times daily with a meal.        . cetirizine (ZYRTEC) 10 MG tablet Take 10 mg by mouth daily.        . enalapril (VASOTEC) 20 MG tablet Take 20 mg by mouth 2 (two) times daily.        . furosemide (LASIX) 40 MG tablet Take 40 mg by mouth 3 (three) times daily.        Marland Kitchen gabapentin (NEURONTIN) 300 MG capsule Take 300 mg by mouth 3 (three) times daily.        Marland Kitchen glipiZIDE (GLUCOTROL) 10 MG tablet Take 10 mg by mouth 2 (two) times daily before a meal.        . metFORMIN (GLUCOPHAGE) 1000 MG tablet Take 1,000 mg by mouth 2 (two) times daily with a meal.        . niacin 500 MG tablet Take 500 mg by mouth daily with breakfast.        . venlafaxine (EFFEXOR) 50 MG tablet Take 50 mg by mouth at bedtime.        Marland Kitchen zolpidem (AMBIEN) 10 MG tablet Take 10 mg  by mouth at bedtime.        Marland Kitchen morphine (MS CONTIN) 60 MG 12 hr tablet Take 1 tablet (60 mg total) by mouth 2 (two) times daily.  60 tablet  0  . morphine (MSIR) 15 MG tablet Take 1 tablet (15 mg total) by mouth 3 (three) times daily as needed for pain.  90 tablet  0    Allergies Review of patient's allergies indicates no known allergies.  Past Medical History  Diagnosis Date  . CHF (congestive heart failure)     EF 30-35%, nonischemic  . CAD (coronary artery disease)   . Hyperlipidemia   . Asthma   . Allergy   . PVD (peripheral vascular disease)   . Diabetes mellitus   . Tobacco abuse   . Hepatitis C   . Hypertension     Past Surgical History  Procedure Date  . Left  leg mrsa infection s/p aka        Objective:   Physical Exam General: Vital signs reviewed and noted. Well-developed,well-nourished,in no acute distress; alert,appropriate and cooperative throughout examination. Head: normocephalic, atraumatic. Neck: No deformities, masses, or tenderness noted. Lungs: Normal respiratory effort. Clear to auscultation BL without crackles or wheezes.  Heart: RRR. S1 and S2 normal without gallop, murmur, or rubs.  Abdomen: BS normoactive. Soft, Nondistended, non-tender.  No masses or organomegaly. Extremities: Right low leg swelling, pitting edema 2+ uo to the mid of low leg, erythema with warmth and a 2 com wound w/o pus drainge. Very right mild foot swelling and pedal dorsal pulse slightly decreased. Left AKA.          Assessment & Plan:

## 2010-09-03 NOTE — Assessment & Plan Note (Addendum)
His right leg skin lesion is likely cellulitis on top of CHF and PVD, DM. Will increase his lasix dose and start clindamycin for 10 days. Recheck in 2 weeks and told him to elevate the affected leg. If has fever or symptoms worse, need to return for further evaluation, including check AKI.

## 2010-09-03 NOTE — Assessment & Plan Note (Signed)
Since he has right leg swelling with cellulitis and will increase lasix. Continue other meds including BB, ACEIs, ASA, statin.

## 2010-09-03 NOTE — Assessment & Plan Note (Signed)
Lab Results  Component Value Date   HGBA1C 6.7 09/03/2010   HGBA1C 7.5 03/04/2010   CREATININE 1.14 07/19/2010   MICROALBUR 58.41* 04/30/2010   MICRALBCREAT 952.9* 04/30/2010   CHOL 130 05/21/2010   HDL 30* 05/21/2010   TRIG 161 05/21/2010    A1C at goal and CBG usually runs 130s and will continue current meds.

## 2010-09-04 LAB — CBC WITH DIFFERENTIAL/PLATELET
HCT: 41.3 % (ref 39.0–52.0)
Hemoglobin: 13.9 g/dL (ref 13.0–17.0)
Lymphocytes Relative: 36 % (ref 12–46)
MCHC: 33.7 g/dL (ref 30.0–36.0)
Monocytes Absolute: 1 10*3/uL (ref 0.1–1.0)
Monocytes Relative: 11 % (ref 3–12)
Neutro Abs: 4.5 10*3/uL (ref 1.7–7.7)

## 2010-09-05 ENCOUNTER — Encounter: Payer: Self-pay | Admitting: Internal Medicine

## 2010-09-10 LAB — GLUCOSE, CAPILLARY
Glucose-Capillary: 111 mg/dL — ABNORMAL HIGH (ref 70–99)
Glucose-Capillary: 129 mg/dL — ABNORMAL HIGH (ref 70–99)

## 2010-09-16 ENCOUNTER — Ambulatory Visit (INDEPENDENT_AMBULATORY_CARE_PROVIDER_SITE_OTHER): Payer: MEDICARE | Admitting: Internal Medicine

## 2010-09-16 ENCOUNTER — Encounter: Payer: Self-pay | Admitting: Internal Medicine

## 2010-09-16 DIAGNOSIS — L02419 Cutaneous abscess of limb, unspecified: Secondary | ICD-10-CM

## 2010-09-16 DIAGNOSIS — M79609 Pain in unspecified limb: Secondary | ICD-10-CM

## 2010-09-16 DIAGNOSIS — L03115 Cellulitis of right lower limb: Secondary | ICD-10-CM

## 2010-09-16 DIAGNOSIS — I10 Essential (primary) hypertension: Secondary | ICD-10-CM

## 2010-09-16 DIAGNOSIS — E119 Type 2 diabetes mellitus without complications: Secondary | ICD-10-CM

## 2010-09-16 DIAGNOSIS — G8929 Other chronic pain: Secondary | ICD-10-CM

## 2010-09-16 DIAGNOSIS — E785 Hyperlipidemia, unspecified: Secondary | ICD-10-CM

## 2010-09-16 LAB — BASIC METABOLIC PANEL
Potassium: 3.7 mEq/L (ref 3.5–5.3)
Sodium: 140 mEq/L (ref 135–145)

## 2010-09-16 MED ORDER — MORPHINE SULFATE 15 MG PO TABS: 15.0000 mg | ORAL_TABLET | Freq: Three times a day (TID) | ORAL | Status: DC | PRN

## 2010-09-16 MED ORDER — FUROSEMIDE 80 MG PO TABS: 80.0000 mg | ORAL_TABLET | Freq: Two times a day (BID) | ORAL | Status: DC

## 2010-09-16 MED ORDER — MORPHINE SULFATE CR 60 MG PO TB12: 60.0000 mg | ORAL_TABLET | Freq: Two times a day (BID) | ORAL | Status: DC

## 2010-09-16 MED ORDER — SULFAMETHOXAZOLE-TRIMETHOPRIM 800-160 MG PO TABS: 1.0000 | ORAL_TABLET | Freq: Two times a day (BID) | ORAL | Status: AC

## 2010-09-16 NOTE — Assessment & Plan Note (Signed)
Patient's cellulitis looks much better today. Dr. Threasa Beards also had a look at patient's right leg. Dr. Threasa Beards was the one who initially started the patient on clindamycin for 2 weeks. I still feel that patient has some infection left in right leg. I would start patient on Bactrim for next 10 days asked him to continue doing supportive measures like raising up his leg to increase the drainage of fluid. And also follow him back in 10 days.

## 2010-09-16 NOTE — Assessment & Plan Note (Signed)
Patient's blood pressure is well controlled N. Today's visit. I would continue the patient on the current blood pressure medications. We are increasing Lasix to 80 mg twice daily for at least next 10 days and I would change that to his normal dose when he comes up for a followup visit.

## 2010-09-16 NOTE — Patient Instructions (Signed)
Cellulitis Cellulitis is an infection of the skin and the tissue beneath it. The area is typically red and tender. It is caused by germs (bacteria) (usually staph or strep) that enter the body through cuts or sores. Cellulitis most commonly occurs in the arms or lower legs.  HOME CARE INSTRUCTIONS  If you are given a prescription for medications which kill germs (antibiotics), take as directed until finished.   If the infection is on the arm or leg, keep the limb elevated as able.   Use a warm cloth several times per day to relieve pain and encourage healing.   See your caregiver for recheck of the infected site in right leg or sooner if problems arise.   Only take over-the-counter or prescription medicines for pain, discomfort, or fever as directed by your caregiver.  SEEK MEDICAL CARE IF:  An oral temperature above 101.0 F develops, not controlled by medication.   The area of redness (inflammation) is spreading, there are red streaks coming from the infected site, or if a part of the infection begins to turn dark in color.   The joint or bone underneath the infected skin becomes painful after the skin has healed.   The infection returns in the same or another area after it seems to have gone away.   A boil or bump swells up. This may be an abscess.   New, unexplained problems such as pain or fever develop.  SEEK IMMEDIATE MEDICAL CARE IF:  You or your child feels drowsy or lethargic.   There is vomiting, diarrhea, or lasting discomfort or feeling ill (malaise) with muscle aches and pains.  MAKE SURE YOU:   Understand these instructions.   Will watch your condition.   Will get help right away if you are not doing well or get worse.  Document Released: 03/19/2005 Document Re-Released: 04/06/2009 [facility] Patient Information 2011 [place], [place].

## 2010-09-16 NOTE — Assessment & Plan Note (Signed)
Excellent control. Hba1c 6.7 the last visit. Continue current meds.

## 2010-09-16 NOTE — Progress Notes (Signed)
  Subjective:    Patient ID: Jonathon Galvan, male    DOB: 07-06-1945, 65 y.o.   MRN: [id number]  HPI Jonathon Galvan is a 65 year old man past medical history as described in the chart. Patient is here today for a followup appointment for his right leg cellulitis. Patient was last seen by Dr. Threasa Beards 2 weeks ago and was prescribed clindamycin which he claims he took for a last 2 weeks. Patient's Lasix was also increased to 40 mg twice a day. Patient says that he does not have any fevers chills. Patient states that he has seen improvement in his right leg cellulitis in last 2 weeks.   I got Dr. Threasa Beards from the other hallway and showed Jonathon Galvan leg, both according to Jonathon Galvan and Dr. Threasa Beards the leg looks a lot better than what it looked to 2 weeks ago.  I still feel that there is little bit of infection left since there is skin bleb and the leg still looks red. No other complaints at this time.    Patient has edema in his right leg which has been a problem for him he did Lasix was increased to twice daily 40 mg last 2 weeks ago. I would increase the dose of Lasix today he did    Patient is due for his pain medication refills both morphine long-term and instant release.    Patient is still smoking about 2-3  Cigarettes a day. Patient also mentions about smoking marijuana on the weekend when he was partying with his friends. I will obtain urine drug screen today to confirm opiate use and also look for other substances of abuse.    Review of Systems  Constitutional: Negative for fever, activity change and appetite change.  HENT: Negative for sore throat.   Respiratory: Negative for cough and shortness of breath.   Cardiovascular: Negative for chest pain and leg swelling.  Gastrointestinal: Negative for nausea, abdominal pain, diarrhea, constipation and abdominal distention.  Genitourinary: Negative for frequency, hematuria and difficulty urinating.  Neurological: Negative for dizziness and headaches.    Psychiatric/Behavioral: Negative for suicidal ideas and behavioral problems.       Objective:   Physical Exam  Constitutional: He is oriented to person, place, and time. He appears well-developed and well-nourished.  HENT:  Head: Normocephalic and atraumatic.  Eyes: Conjunctivae and EOM are normal. Pupils are equal, round, and reactive to light. No scleral icterus.  Neck: Normal range of motion. Neck supple. No JVD present. No thyromegaly present.  Cardiovascular: Normal rate, regular rhythm, normal heart sounds and intact distal pulses.  Exam reveals no gallop and no friction rub.   No murmur heard. Pulmonary/Chest: Effort normal and breath sounds normal. No respiratory distress. He has no wheezes. He has no rales.  Abdominal: Soft. Bowel sounds are normal. He exhibits no distension and no mass. There is no tenderness. There is no rebound and no guarding.  Musculoskeletal: Normal range of motion. He exhibits edema. He exhibits no tenderness.       Right shoulder: He exhibits swelling and effusion.       Left AKA.  Lymphadenopathy:    He has no cervical adenopathy.  Neurological: He is alert and oriented to person, place, and time.  Skin: Skin is intact. Lesion and rash noted. Rash is vesicular. There is erythema.  Psychiatric: He has a normal mood and affect. His behavior is normal.          Assessment & Plan:

## 2010-09-16 NOTE — Assessment & Plan Note (Signed)
Patient is currently taking Lipitor 10 mg daily. Last LDL was 4 months ago and it showed dull low HDL. We may consider starting the patient on niacin but again this is something which has not shown any mortality benefit. We would continue to follow yearly lipid profiles.  Patient's LDL is at goal

## 2010-09-17 LAB — DRUGS OF ABUSE SCREEN W/O ALC, ROUTINE URINE
Amphetamine Screen, Ur: NEGATIVE
Benzodiazepines.: NEGATIVE
Marijuana Metabolite: NEGATIVE
Phencyclidine (PCP): NEGATIVE

## 2010-09-19 LAB — OPIATE, QUANTITATIVE, URINE
Hydrocodone: 266 NG/ML — ABNORMAL HIGH
Morphine Urine: 29269 NG/ML — ABNORMAL HIGH
Oxycodone - Total: NEGATIVE NG/ML
Oxymorphone: NEGATIVE NG/ML

## 2010-09-26 ENCOUNTER — Ambulatory Visit (INDEPENDENT_AMBULATORY_CARE_PROVIDER_SITE_OTHER): Payer: MEDICARE | Admitting: Internal Medicine

## 2010-09-26 ENCOUNTER — Other Ambulatory Visit: Payer: Self-pay | Admitting: Internal Medicine

## 2010-09-26 VITALS — BP 116/70 | HR 65 | Temp 98.4°F | Wt 212.4 lb

## 2010-09-26 DIAGNOSIS — G8929 Other chronic pain: Secondary | ICD-10-CM

## 2010-09-26 DIAGNOSIS — I509 Heart failure, unspecified: Secondary | ICD-10-CM

## 2010-09-26 DIAGNOSIS — M79609 Pain in unspecified limb: Secondary | ICD-10-CM

## 2010-09-26 DIAGNOSIS — F329 Major depressive disorder, single episode, unspecified: Secondary | ICD-10-CM

## 2010-09-26 DIAGNOSIS — G4700 Insomnia, unspecified: Secondary | ICD-10-CM | POA: Insufficient documentation

## 2010-09-26 MED ORDER — ZOLPIDEM TARTRATE 5 MG PO TABS: 10.0000 mg | ORAL_TABLET | Freq: Every day | ORAL | Status: DC

## 2010-09-26 NOTE — Progress Notes (Signed)
  Subjective:    Patient ID: Jonathon Galvan, male    DOB: 11-Jul-1945, 65 y.o.   MRN: [id number]  HPI Patient was here today for a followup visit for his right leg cellulitis. Patient states that the pain is completely resolved and also he does not see any open wounds.  Patient is on him on these dozes off narcotics and Dr. 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  This patient should not be on such high doses of narcotics  For phantom pain for which narcotics are not indicated. We confronted the patient and told him that we would like to cut down on his narcotic meds and would slowly taper her down. Patient was not happy with this news and was visibly angry. We would taper the narcotics down slowly as mentioned in the [facility] section.  No other complaints today.  A total of 30 minutes was spent for counseling and management of pain meds.   Review of Systems  Constitutional: Negative for fever, activity change and appetite change.  HENT: Negative for sore throat.   Respiratory: Negative for cough and shortness of breath.   Cardiovascular: Negative for chest pain and leg swelling.  Gastrointestinal: Negative for nausea, abdominal pain, diarrhea, constipation and abdominal distention.  Genitourinary: Negative for frequency, hematuria and difficulty urinating.  Neurological: Negative for dizziness and headaches.  Psychiatric/Behavioral: Negative for suicidal ideas and behavioral problems.       Objective:   Physical Exam  Constitutional: He is oriented to person, place, and time. He appears well-developed and well-nourished.  HENT:  Head: Normocephalic and atraumatic.  Eyes: Conjunctivae and EOM are normal. Pupils are equal, round, and reactive to light. No scleral icterus.  Neck: Normal range of motion. Neck supple. No JVD present. No thyromegaly present.    Cardiovascular: Normal rate, regular rhythm, normal heart sounds and intact distal pulses.  Exam reveals no gallop and no friction rub.   No murmur heard. Pulmonary/Chest: Effort normal and breath sounds normal. No respiratory distress. He has no wheezes. He has no rales.  Abdominal: Soft. Bowel sounds are normal. He exhibits no distension and no mass. There is no tenderness. There is no rebound and no guarding.  Musculoskeletal: Normal range of motion. He exhibits no edema and no tenderness.       Left AKA. Right leg had pitting edema but no open wounds.   Lymphadenopathy:    He has no cervical adenopathy.  Neurological: He is alert and oriented to person, place, and time.  Psychiatric: He has a normal mood and affect. His behavior is normal.          Assessment & Plan:

## 2010-09-26 NOTE — Assessment & Plan Note (Signed)
The plan to taper down on his narcotics. Plan to taper down instant release morphine by one tablet per day every 2-3 weeks until it comes down to 0. And then his sustained release morphine should be tapered down by 15 mg every 2-3 weeks.

## 2010-09-26 NOTE — Assessment & Plan Note (Signed)
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 Patient's ejection fraction is 30% and he was thought to be eligible for ICD placement as per records from the chart. Patient has not followed up with his cardiologist. I would ask K. to make an appointment for him for evaluation and management of his CHF with ICD.

## 2010-09-26 NOTE — Patient Instructions (Signed)
We will cut down on your Narcotic pain meds every month to ultimately taper it down to just long term meds. You need to set up an appointment with your cardiologist for ICD placement. I will refill your other meds today. Set up appointment in 1 month or earlier as necessary.     Peripheral Edema You have swelling in your legs (peripheral edema). This swelling is due to excess accumulation of salt and water in your body. Edema may be a sign of heart, kidney or liver disease, or a side effect of a medication. It may also be due to problems in the leg veins. Elevating your legs and using special support stockings may be very helpful, if the cause of the swelling is due to poor venous circulation. Avoid long periods of standing, whatever the cause. Treatment of edema depends on identifying the cause. Chips, pretzels, pickles and other salty foods should be avoided. Restricting salt in your diet is almost always needed. Water pills (diuretics) are often used to remove the excess salt and water from your body via urine. These medicines prevent the kidney from reabsorbing sodium. This increases urine flow. Diuretic treatment may also result in lowering of potassium levels in your body. Potassium supplements may be needed if you have to use diuretics daily. Daily weights can help you keep track of your progress in clearing your edema. You should call your caregiver for follow up care as recommended. SEEK IMMEDIATE MEDICAL CARE IF:  There is markedly increased swelling, pain, redness or heat in your legs.   You develop shortness of breath, especially when lying down.   You develop chest or abdominal pain, weakness or fainting.   You have an oral temperature above 102 F (38.9 C), not controlled by medicine.  Document Released: 07/17/2004 Document Re-Released: 07/01/2009 [facility] Patient Information 2011 [place], [place].

## 2010-10-07 LAB — GLUCOSE, CAPILLARY: Glucose-Capillary: 87 mg/dL (ref 70–99)

## 2010-10-16 ENCOUNTER — Other Ambulatory Visit: Payer: Self-pay | Admitting: *Deleted

## 2010-10-16 MED ORDER — MORPHINE SULFATE 15 MG PO TABS: 15.0000 mg | ORAL_TABLET | Freq: Two times a day (BID) | ORAL | Status: DC | PRN

## 2010-10-16 MED ORDER — MORPHINE SULFATE CR 60 MG PO TB12: 60.0000 mg | ORAL_TABLET | Freq: Two times a day (BID) | ORAL | Status: DC

## 2010-10-16 NOTE — Telephone Encounter (Signed)
Refill due on Saturday 4/28, will you refill on Friday? Call when ready [phone]

## 2010-10-16 NOTE — Telephone Encounter (Signed)
Pt informed

## 2010-10-16 NOTE — Telephone Encounter (Signed)
Plan is go down on his MSIR by 1 tab per day every month for next 3 months and then taper down his MSSR.

## 2010-10-18 ENCOUNTER — Ambulatory Visit (INDEPENDENT_AMBULATORY_CARE_PROVIDER_SITE_OTHER): Payer: MEDICARE | Admitting: Internal Medicine

## 2010-10-18 ENCOUNTER — Encounter: Payer: Self-pay | Admitting: Internal Medicine

## 2010-10-18 VITALS — BP 121/65 | HR 60 | Temp 97.6°F | Ht 69.0 in | Wt 212.7 lb

## 2010-10-18 DIAGNOSIS — G8929 Other chronic pain: Secondary | ICD-10-CM

## 2010-10-18 DIAGNOSIS — M79606 Pain in leg, unspecified: Secondary | ICD-10-CM

## 2010-10-18 DIAGNOSIS — M79609 Pain in unspecified limb: Secondary | ICD-10-CM

## 2010-10-22 NOTE — Assessment & Plan Note (Signed)
Patient and her sister are both not happy about the fact that the narcotics for phantom pain are being tapered. After extensive discussion with them, the sister was somewhat agreeable to the fact that Mr Ransford on exorbitant amount of pain meds which may endanger his life. Mr Miyamoto left the room saying that he will be going to a different doctor in town to see if could get his pain meds and asked me about the procedure to transfer his chart.

## 2010-10-22 NOTE — Progress Notes (Signed)
  Subjective:    Patient ID: Jonathon Galvan, male    DOB: 1946-03-10, 64 y.o.   MRN: [id number]  HPI  Patient is 65 yo man with pmh of left aka and on chronic narcotics for pain meds.  He is here today with his sister as he is concerned that we are cutting down on his pain meds. Patient is currently in 165mg  of morphine a day and we had cut it down to 150mg  a day since last week. They are concerned that patient may use up his long acting morphine.   sReview of Systems  Constitutional: Negative for fever, activity change and appetite change.  HENT: Negative for sore throat.   Respiratory: Negative for cough and shortness of breath.   Cardiovascular: Negative for chest pain and leg swelling.  Gastrointestinal: Negative for nausea, abdominal pain, diarrhea, constipation and abdominal distention.  Genitourinary: Negative for frequency, hematuria and difficulty urinating.  Neurological: Negative for dizziness and headaches.  Psychiatric/Behavioral: Negative for suicidal ideas and behavioral problems.       Objective:   Physical Exam  Constitutional: He is oriented to person, place, and time. He appears well-developed and well-nourished.  HENT:  Head: Normocephalic and atraumatic.  Eyes: Conjunctivae and EOM are normal. Pupils are equal, round, and reactive to light. No scleral icterus.  Neck: Normal range of motion. Neck supple. No JVD present. No thyromegaly present.  Cardiovascular: Normal rate, regular rhythm, normal heart sounds and intact distal pulses.  Exam reveals no gallop and no friction rub.   No murmur heard. Pulmonary/Chest: Effort normal and breath sounds normal. No respiratory distress. He has no wheezes. He has no rales.  Abdominal: Soft. Bowel sounds are normal. He exhibits no distension and no mass. There is no tenderness. There is no rebound and no guarding.  Musculoskeletal: Normal range of motion. He exhibits edema. He exhibits no tenderness.       Left AKA    Lymphadenopathy:    He has no cervical adenopathy.  Neurological: He is alert and oriented to person, place, and time.  Psychiatric: He has a normal mood and affect. His behavior is normal.          Assessment & Plan:

## 2010-11-05 NOTE — Assessment & Plan Note (Signed)
HISTORY:  Jonathon Galvan is back regarding his left above-the-knee  amputation.  Generally from a pain standpoint, he has been doing fairly  well.  His biggest issue seems to be fitting of his left leg.  He notes  hyperextension in stance phase with gait, and it has caused him  instability and he has had a fall on at least one occasion.  He states  that he is wearing about 6-ply socks.  He does not notice any particular  movement of the leg within the sock.  His sugars he states are under  good control.  The pain ranges from a 7-8/10.  This causes burning and  aching.   REVIEW OF SYSTEMS:  Notable for the above as well as occasional limb  swelling.  A full review is in the written health and history section of  the chart.   SOCIAL HISTORY:  The patient is single and living alone.  He smokes 1/2  pack of cigarettes per day.   PHYSICAL EXAMINATION:  VITAL SIGNS:  Blood pressure 152/89, pulse 85,  respirations 16, saturation 97% on room air.  GENERAL:  The patient is pleasant, alert and oriented x3.  It appears  that he has lost some weight.  NEUROLOGIC:  He is antalgic on the left side, although there still seems  to be hyperextension of the left knee and stance.  I saw no pistoning or  twisting in the socket.  It did not appear as if there is inappropriate  spacing between the socket and his leg.  Strength is generally 5/5 in  all extremities today, except the left hip which is 3+ to 4/5.  HEART:  Regular.  CHEST:  Clear.  ABDOMEN:  Soft, nontender.   ASSESSMENT:  1. 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  2. Phantom pain.  3. Insulin-requiring diabetes.   PLAN:  1. Will change MS Contin for simplicity's sake to 30 mg q.8h.  2. Percocet 10 mg, one q.8h. p.r.n. for break-through symptoms.  3. Continue gait and exercise.  4. I will send him to [facility] for evaluation of his      socket.  I do not know that he needs a socket replacement.  He may      need  adjustment of his knee to avoid the hyper-extension moment.  5. I will see him back here in three months.  6. A nurse clinic followup in one month's time.      Ranelle Oyster, M.D.  Electronically Signed     ZTS/MedQ  D:  10/19/2007 13:02:11  T:  10/19/2007 14:01:36  Job #:  [id number]

## 2010-11-05 NOTE — Assessment & Plan Note (Signed)
Jonathon Galvan is back regarding his left above-knee amputation and ongoing  phantom and stump pain.  He states that over the last few weeks, his  stump and phantom pain in particular have increased.  He rates his pain  8 to 10 out of 10 depending on whether another issues.  He had a new  [facility] a few weeks ago [facility]  He complains of some blisters  on the leg as well.  He remains on MS Contin 30 mg q.8 h, Percocet 10  one q.8 h p.r.n. for breakthrough symptoms.  We also have him on Elavil  100 mg nightly, Neurontin 300 mg t.i.d.  He uses Lidoderm patches for  local relief as well.   On review of systems, the patient reports fair sleep.  He reports  trouble walking and elevated sugars at times.  Mood has been decreased  with worsening pain.  Full 14-point review is in the written health and  history section of the chart.   SOCIAL HISTORY:  The patient lives alone and is still smoking.  His  sister assists him on a regular basis with his meds or other matters.   PHYSICAL EXAMINATION:  VITAL SIGNS:  Blood pressure is 128/71, pulse 74,  respiratory rate 18.  He is sating 98% on room air.  GENERAL:  The patient is very flat, affect is poor.  After talking with  a buddy, he seemed to perk up.  Left leg was examined and remains tender  to touch distally.  He had a 3-4 blisters over the distal end of the  stump where he had some redundant tissue.  The socket appeared to  grossly fit.  Strength remains 5/5 right leg, 4-5/5 left leg, today at  the hip.  HEART:  Regular.  CHEST:  Clear.  ABDOMEN:  Soft, nontender.  NEUROLOGIC:  Cognitively, I felt he was at his baseline.  He walked with  his prosthesis today and had antalgia to the left.   ASSESSMENT:  1. Left above-knee amputation.  2. Phantom pain.  3. Insulin-requiring diabetes.   PLAN:  1. We will adjust MS Contin to 60 mg q.12 h, #60 were written.  2. Continue Percocet 10/650 one q.8 h p.r.n. #90.  3. The patient is to go to  [facility] for socket adjustment      and refitting.  Obviously, he is not getting full contact and      likely he is having some pistoning in the socket.  4. Continue Elavil and Lidoderm as prescribed.  5. We will increase Neurontin to 300 mg q.i.d.  6. I will see him back in about a month.      Ranelle Oyster, M.D.  Electronically Signed     ZTS/MedQ  D:  07/04/2008 11:39:27  T:  07/05/2008 02:01:15  Job #:  [id number]

## 2010-11-05 NOTE — Consult Note (Signed)
NAME:  Jonathon Galvan, Jonathon Galvan NO.:  [id number]   MEDICAL RECORD NO.:  [id number]          PATIENT TYPE:  INP   LOCATION:  2115                         FACILITY:  [facility]   PHYSICIAN:  Cristy Hilts. Jacinto Halim, MD       DATE OF BIRTH:  12/03/1945   DATE OF CONSULTATION:  08/12/2007  DATE OF DISCHARGE:                                 CONSULTATION   REQUESTING PHYSICIAN:  Manning Charity, M.D.   REASON FOR CONSULTATION:  Left bundle branch block, cardiomyopathy,  positive cardiac markers i.e., troponin.   IMPRESSIONS:  1. Chronic systolic heart failure, well compensated.  2. Cardiomyopathy.  I suspect intravenous drug abuse and cocaine use      as an etiology for his cardiomyopathy.  I doubt that he has      ischemic cardiomyopathy.  3. Left bundle branch block, question new.  The patient had an      echocardiogram done in June 2007, and this had revealed a septal      dyssynchrony and severe left ventricular systolic dysfunction.  I      suspect this left bundle branch is probably old.  No evidence of      any nonsustained ventricular tachycardia.  No primary ST-T wave      changes of ischemia.  4. History of methicillin-resistant Staphylococcus aureus arthropathy,      left knee, status post above knee amputation in 2005.  5. Chronic hepatitis C.  6. Congestive heart failure.  7. Hypertension, presently borderline blood pressure.  8. Diabetic ketoacidosis.   RECOMMENDATIONS:  I highly doubt this is acute coronary syndrome.  I  would treat his primary issues, i.e., the diabetic ketoacidosis and  acute renal failure, possibly his mental status will also improve with  controlling hyponatremia, DKA.  Eventually he may benefit from a stress  Myoview to exclude ischemic etiology, however, he may need cardiac  catheterization for definitive diagnosis of coronary disease or i.e.,  reversible causes of cardiomyopathy.  However, clinically I suspect that  he probably has substance abuse  cardiomyopathy, i.e., cocaine  cardiomyopathy.  There are no acute cardiac issues at this point.  Please call us if  there is any help that you need from [place].  The patient does have chronic systolic heart failure and presently is  well compensated.  Once he is stable and tolerating p.o. medications and  confusion has resolved, would consider switching him over to Coreg and  addition of BiDil for cardiomyopathy.   HISTORY:  Mr. Mirarchi is a 65 year old gentleman who was admitted to [facility] on February 17th with altered mental status and diabetic  ketoacidosis.  While in the process of evaluation of his altered mental  status, an EKG was obtained and this had revealed a left bundle branch  block and cardiac markers revealed his troponin to be minimally elevated  at 0.09.  However, his total CPK and MB have been negative for  myocardial injury.   The patient at present denies any chest pain, shortness of breath,  paroxysmal nocturnal dyspnea, or orthopnea.   REVIEW  OF SYSTEMS:  He is confused and is drowsy but otherwise responds  appropriately.  He denies any chest pain in the past.  Denies  hemoptysis.  Denies any loss of consciousness or syncope.  The review of  systems is limited.   PAST MEDICAL HISTORY:  As stated above.   SOCIAL HISTORY:  He is single, lives by himself.  The patient recently  moved to [place] a couple of years ago from [place].  There is a  history of polysubstance abuse in the past and he also is a smoker.   FAMILY HISTORY:  Not obtainable at this point.   PRESENT MEDICATIONS:  See the hospital records for the same.   ALLERGIES:  No known drug allergies.   PHYSICAL EXAMINATION:  GENERAL:  He is well built, well nourished,  appears to be in no acute distress.  VITAL SIGNS:  Include heart rate 87 to 94 beats per minute regular,  respirations 18, blood pressure 101/69-mmHg.  CARDIAC:  S1 is normal.  S2 is paradoxically split.  There is no  murmur  appreciated.  CHEST:  Bilateral clear breath sounds.  No crackles.  ABDOMEN:  Soft.  EXTREMITIES:  Right lower extremity no edema.  Left has an above the  knee amputation.   His EKG demonstrates a left bundle branch block.  His labs were reviewed  and he has got renal insufficiency which is improving with creatinine  today 1.72.  He still has hypernatremia.   Thank you for the consultation.  Please call us if there are any  questions regarding his care.      Cristy Hilts. Jacinto Halim, MD  Electronically Signed     JRG/MEDQ  D:  08/12/2007  T:  08/13/2007  Job:  [id number]   cc:   Manning Charity, MD

## 2010-11-05 NOTE — Assessment & Plan Note (Signed)
HISTORY OF PRESENT ILLNESS:  Kynan is back regarding his left above-  the-knee amputation.  He ran out of his OxyContin a few days ago and has  had to stretch himself with his breakthrough medication.  His pain has  been increased as a result 8/10.  With the pain medication, he is more a  6/10 . He is walking regularly on his prosthesis.  He had a fall a few  weeks ago where he bumped his head.  He has not had any repercussions of  this.  Pain interferes with general activity, relationship with others,  enjoyment of life on a moderate level.  Pain is described as sharp,  burning, tingling and aching.  Sleep is fair.   REVIEW OF SYSTEMS:  Notable for the above, as well as anxiety and weight  gain.  Full review is in the written Health and History section.   SOCIAL HISTORY:  The patient is single and living alone.  Smokes half  pack of cigarettes per day.   PHYSICAL EXAMINATION:  VITAL SIGNS:  Blood pressure 148/69, pulse 76,  respirations 20, saturation 94% on room air.  GENERAL:  The patient is pleasant, alert and oriented x3.  Affect is  generally appropriate.  It is a bit flatter today due to his pain, but  otherwise stable.  Left leg had no change in its appearance.  He is  wearing his prosthesis today and sitting in a wheelchair.  HEART:  Regular .  CHEST:  Clear.  ABDOMEN:  Soft, nontender.   ASSESSMENT:  1. 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  2. Left lower extremity phantom pain.   PLAN:  1. Continue Lidoderm patch to stop.  2. OxyContin 40 mg q.12 h #60, Percocet 10 one q.8 h #80, Lyrica 75 mg      t.i.d., Neurontin 300 mg t.i.d.  3. I will see the patient back in three months with the nurse follow      up visit in one month.      Ranelle Oyster, M.D.  Electronically Signed     ZTS/MedQ  D:  12/07/2006 14:40:33  T:  12/07/2006 20:04:59  Job #:  [id number]

## 2010-11-05 NOTE — Assessment & Plan Note (Signed)
FOLLOW-UP NOTE   SUBJECTIVE:  Jonathon Galvan is back regarding his left leg/stump pain and  phantom symptoms. His pain has been a lot worse over the last month or  so. We switched him over to Fentanyl patches. His aide had called up and  stated that his MS Contin was not helping. The patient states that the  Fentanyl patches have not worked for him. He states that the MS Contin  was working and he is not sure why his aide had called and given [place] that  information. He rates his pain at 9/10 today. He has pain at the stump  as well as distally in the previous region of his limb. He has some left  hand numbness and tingling as well. He states that the pain interferes  with his general activity, relationship with others and enjoyment of  life on a severe level. He uses Percocet for his breakthrough pain. He  is not out of his allotment of monthly Percocet as of today. The  pharmacy apparently will not cover his Neurontin and Lyrica any further  and will only cover one medication.   REVIEW OF SYSTEMS:  Notable for numbness, trouble walking, limb  swelling. He is limited with his leg use as the leg causes pain in the  distal stump. Anxiety has been a problem as well as depression and poor  sleep.   SOCIAL HISTORY:  Patient is single and lives alone with an aide in the  house to help him.   PHYSICAL EXAMINATION:  VITAL SIGNS: Blood pressure 123/68, pulse 71,  respiratory rate 18, oxygen saturation 97% on room air.  GENERAL: Patient is pleasant, a bit anxious, but otherwise appropriate.  HEART: Regular.  CHEST:  Clear.  ABDOMEN:  Soft, nontender.  EXTREMITIES:  He has numbness and tingling in the ulnar distribution  over the lateral side of the right hand. Tinel's test is positive at the  elbow. Strength is intact. No wasting of the muscles was seen. He has  good grip. Left leg is painful distally. Skin is intact.   ASSESSMENT:  1. Left above knee amputation with residual stump pain.  2.  Phantom pain.  3. Right ulnar nerve compression at the elbow which appears mild at      this point.   PLAN:  1. Switch back to MS Contin 30 mg every 12 hours.  2. Percocet 10/650 one every 8 hours p.r.n.  3. We will stop Neurontin and increase Lyrica to 100 mg three times a      day watching cognitive status closely.  4. I will see the patient back in two months with R.N. follow-up in      one.      Ranelle Oyster, M.D.  Electronically Signed     ZTS/MedQ  D:  03/08/2007 11:06:26  T:  03/08/2007 11:28:34  Job #:  [id number]

## 2010-11-05 NOTE — Assessment & Plan Note (Signed)
Jonathon Galvan is back regarding his left stump and phantom pain.  He has been  having a rough time recently as his daughter died a few days ago from  complications from diabetes.  The patient is excited about receiving a  new prosthesis which with trails felt extremely better, could be  substantially better on his leg.  Currently his MS Contin is 30 mg q.8  h. with Percocet 10 mg for breakthrough pain 1 q.8 h. p.r.n.  The  patient rates his stump pain at 9/10 and his phantom pain at 7-8/10.  Pain is burning and constant and more related to activity, bending,  standing, and walking etc.  He is walking more and can walk up to 2  blocks at a time.  He is trying to walk daily with his sister.  Pain  interferes with general activity, relationships with others, and  enjoyment of life on a moderate-to-severe level.   REVIEW OF SYSTEMS:  Notable for the above.  Full review is in the  written health and history section of the chart.   SOCIAL HISTORY:  As noted above.  He still smokes 1 pack of cigarettes  per day.   PHYSICAL EXAMINATION:  VITAL SIGNS:  Blood pressure is 148/83, pulse is  73, respiratory rate 18, and he is sating 97% on room air.  GENERAL:  The patient is pleasant, alert, and oriented x3.  Affect is  bright and appropriate.  Left leg remains somewhat sensitive to touch.  He has good range of motion, however, there.  MUSCULOSKELETAL:  His strength is 5/5 really throughout except for the  left hip which is more 4/5.  HEART:  Regular rate.  CHEST:  Clear.  ABDOMEN:  Soft and nontender.  The patient is walking with his old  prosthesis and still walks with an antalgia to the left side.  He has  some hyperextension of his knee as well.   ASSESSMENT:  1. Left above-knee amputation with stump pain.  2. Phantom pain.  3. Insulin-requiring diabetes.   PLAN:  Continue with MS Contin and Percocet as prescribed for now.   GOAL:  1. After socket/leg refitting to wean medications perhaps in  the New      Year.  If the pain does not improve over the longer term with the      new leg, we will need to look at other interventional measures      perhaps injections.  2. Encouraged exercise and better health care and hygiene.  3. I will see him back in about 3 months' time with nurse clinic      follow up next month.      Ranelle Oyster, M.D.  Electronically Signed     ZTS/MedQ  D:  04/05/2008 11:33:12  T:  04/06/2008 02:27:57  Job #:  [id number]

## 2010-11-05 NOTE — Op Note (Signed)
NAME:  Jonathon Galvan, Jonathon Galvan NO.:  [id number]   MEDICAL RECORD NO.:  [id number]          PATIENT TYPE:  AMB   LOCATION:  SDS                          FACILITY:  [facility]   PHYSICIAN:  Salley Scarlet., M.D.DATE OF BIRTH:  19-May-1946   DATE OF PROCEDURE:  DATE OF DISCHARGE:                               OPERATIVE REPORT   PREOPERATIVE DIAGNOSIS:  Mature cataract, left eye.   POSTOPERATIVE DIAGNOSIS:  Mature cataract, left eye.   OPERATIONS:  Kelman phacoemulsification cataract, left eye, with  intraocular lens implantation.   ANESTHESIA:  Local using Xylocaine 2% Marcaine 0.75% and Wydase.   JUSTIFICATION FOR PROCEDURE:  This is a 65 year old gentleman who  complains of blurring of vision with difficulty seeing to read.  He was  evaluated and found to have a visual acuity best corrected to 20/50 on  the right, 20/70 on the left.  There were bilateral posterior  subcapsular cataracts, worse on the left than the right.  Cataract  extraction with intraocular lens implantation was recommended.  He is  admitted at this time for that purpose.   PROCEDURE:  Under the influence of IV sedation, Van Lint akinesia and  retrobulbar anesthesia was given.  The patient was prepped and draped in  the usual manner.  The lid speculum was inserted under the upper and  lower lid of the left eye and a 4-0 silk traction suture was passed  through the belly of the superior rectus muscle retraction.  A fornix-  based conjunctival flap was turned and hemostasis achieved using  cautery.  An incision made in the sclera at the limbus.  This incision  was dissected down to clear cornea using a crescent blade.  A sideport  incision made at the 1:30 o'clock position.  Ocucoat was injected into  the eye through the sideport incision.  The anterior chamber was entered  through the corneoscleral tunnel incision at the 11:30 o'clock position.  An anterior capsulotomy was made using a bent 25-gauge  needle.  The KPE  was passed into the eye and the nucleus was emulsified without  difficulty.  The the cortical material was aspirated.  Near the end of  the aspiration of the cortical material, it as found that a small tear  had been obtained into the posterior capsule.  An anterior vitrectomy  was done.  After ascertaining there was no vitreous in anterior chamber,  the wound was widened slightly to accommodate a PMMA lens of the EZE  variety which was inserted behind the iris without difficulty.  The  anterior chamber was reformed and the pupil constricted using Miochol.  The corneoscleral wound was closed by using a single horizontal suture  of 10-0 nylon.  It was ascertained that the wound was airtight and  watertight.  The conjunctiva was closed using thermal cautery.  Then 1  mL of Celestone and 0.5 mL of gentamicin were injected  subconjunctivally.  Maxitrol ophthalmic ointment and Pilocarpine  ointment were applied, along with a patch and Fox shield.  The patient  tolerated the procedure well  and was discharged to the  post anesthesia recovery room in satisfactory  condition.  He is instructed to rest today, to take Vicodin every 4  hours as need for pain and see me in the office tomorrow for further  evaluation.   DISCHARGE DIAGNOSIS:  Mature cataract, left eye, phacoemulsification.      Salley Scarlet., M.D.  Electronically Signed     TB/MEDQ  D:  09/11/2007  T:  09/11/2007  Job:  [id number]

## 2010-11-05 NOTE — Assessment & Plan Note (Signed)
King is back regarding his stump and phantom pain.  He has been doing  a bit better with the increase in MS Contin as well as Neurontin  increase.  He has socket revised by [facility] and that is  working as well.  With a recent cold weather, he has had a bit more  pain, but as a whole, his pain is improved.  He rates his pain today in  the 8-9/10.  He described it as sharp, burning, and stabbing.  The pain  interferes with general activity, relations with others, and enjoyment  of life on a moderate level.   REVIEW OF SYSTEMS:  Notable for some high sugars.  Other pertinent  positives are above and full 14 point review is in the written health  and history section of chart.  The patient's Oswestry score is 66%  today.   SOCIAL HISTORY:  The patient is single and no specific changes are noted  there.  His sister continues to help with his meds and other matters.   PHYSICAL EXAMINATION:  VITAL SIGNS:  Blood pressure is 137/74, pulse is  60, respiratory rate 18, and sating 93% on room air.  GENERAL:  The patient's affect is much improved.  He is pleasant.  EXTREMITIES:  He walks with a bit of limp on the left side, but has good  control.  He is able to change directions without that particular  problem.  He has a 3-wheeled walker that seems to work well for him.  Left leg has healed nicely.  Socket appears to be fitting without  pistoning or rotation.  Strength is 5/5 in right leg and 4-5/5 still in  the residual left limb.  HEART:  Regular.  CHEST:  Clear.  ABDOMEN:  Soft and nontender. Left distal stump is tender to palpation.  NEUROLOGIC:  Cognition is normal.   ASSESSMENT:  1. Left above-knee amputation.  2. Phantom pain.  3. Insulin-requiring diabetes.   PLAN:  1. Continue MS Contin 60 mg q.12 h., #60 and Percocet 10/650 q.8 h.      p.r.n., #90.  Check UDS next month.  2. Continue with close observation of residual left limb.  I      encouraged ambulation and  walking as possible.  3. Maintain Elavil, Lidoderm, and Neurontin at current doses.  4. I will see him back in 3 months with nurse clinic follow up in 1      month time.      Ranelle Oyster, M.D.  Electronically Signed     ZTS/MedQ  D:  08/01/2008 10:50:19  T:  08/01/2008 23:32:44  Job #:  [id number]

## 2010-11-05 NOTE — Assessment & Plan Note (Signed)
HISTORY:  Jonathon Galvan is back regarding his left stump pain.  He notes some  pain in the left leg when he is in cool weather and the air  conditioning.  Otherwise, his leg has been stable.  He will like an  occasional extra Percocet for when pain increases.  He rates his pain as  8-9/10, described as sharp, burning, and stabbing.  Pain interferes with  general activity, relations with others, and enjoyment of life on a  moderate level.  He has not had his leg adjusted by [facility]  as of this visit, although he is planning to see them soon.   REVIEW OF SYSTEMS:  Notable for trouble walking and high sugars.  Full  review is in the written health and history section.  Other pertinent  positives are below.   SOCIAL HISTORY:  The patient is single.  Still smoking half-pack  cigarettes per day.   PHYSICAL EXAMINATION:  VITAL SIGNS:  Blood pressure is 133/78, pulse 66,  respiratory rate 16, and he is sating 92% on room air.  GENERAL:  The patient is pleasant, alert, and oriented x3.  Weight  stable.  EXTREMITIES:  He walks with better weight shift, although still favors  the right side.  It appears as if his left leg maybe a bit too long for  him as well.  He tends to hyperextend the knee and stands.  Strength is  generally 5/5.  He has some sensitivity around the left hip.  Hip  strength maybe closer to 4/5.  HEART:  Regular.  CHEST:  Clear.  ABDOMEN:  Soft and nontender.   ASSESSMENT:  1. Left above-knee amputation with stump pain.  2. Phantom pain.  3. Insulin-requiring diabetes.   PLAN:  1. MS Contin 30 mg q.8 h.  2. We will give him extra 10 Percocet, so he now will have Percocet      10/325, 1 q.8 h. p.r.n. #90.  3. [facility] for leg length adjustment and knee tension      adjustment.  4. We will see him back in 3 months with nurse clinic followup in 1      month's time.      Ranelle Oyster, M.D.  Electronically Signed     ZTS/MedQ  D:   01/10/2008 13:05:07  T:  01/11/2008 06:43:30  Job #:  [id number]

## 2010-11-05 NOTE — Assessment & Plan Note (Signed)
HISTORY:  Jonathon Galvan is back regarding his left above-the-knee amputation  and residual stump and phantom pain.  He has been doing well with the MS  Contin up until the last month or two, and the pain seems to be  increasing again.  He does not know if it is the cold weather or not.  His pain is averaging about 8/10 now.  He was recently diagnosed with  diabetes, after presenting to the hospital with a blood sugar of 1000.  He is now on Lantus and NovoLog insulin for coverage and his sugars have  been running in the 90's to low 100's.  He uses Percocet for break-  through pain.  He has not been back to physical therapy for his  amputation and prosthesis for some time.  He is interested in doing that  today.  His sister is with him today and remains supportive and helps  him with his medications.   REVIEW OF SYSTEMS:  Notable for the above.  The patient has lost some  weight.  A full review of systems is in the written health and history  section of the chart.   SOCIAL HISTORY:  Without change.   PHYSICAL EXAMINATION:  VITAL SIGNS:  Blood pressure 134/83, pulse 91,  respirations 18, saturation 96% on room air.  GENERAL:  The patient is pleasant, alert, oriented x3.  Affect is bright  and appropriate.  NEUROLOGIC:  He walks with antalgic gait still on the left leg.  He has  some hesitation with full weightbearing on that side still as well.  The  prosthesis appears to be fitting him appropriately.  No pistoning or  twisting noted in the socket.  The leg remains somewhat tender to touch.  No swelling or breakdown is noted in the leg.  HEART:  Regular.  CHEST:  Clear.  ABDOMEN:  Soft, nontender.  EXTREMITIES:  Upper extremity strength is excellent today near 5/5  strength.  Normal sensation in the hands and fingers.   ASSESSMENT:  1. 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  2. Phantom pain.  3. Insulin-requiring diabetes.   PLAN:  1. I will increase his MS Contin  to 45 mg q.12h, #180 of the 15 mg      tablets.  2. Continue Percocet 10/650 mg for break-through pain, one q.8h.      p.r.n., #80.  3. Will send the patient back to outpatient PT, to work on gait and      balance.  4. I will see him back in three months.  5. A nurse clinic followup in one month's time.      Ranelle Oyster, M.D.  Electronically Signed     ZTS/MedQ  D:  08/25/2007 12:53:13  T:  08/25/2007 13:22:45  Job #:  [id number]

## 2010-11-05 NOTE — Discharge Summary (Signed)
NAME:  NOAL, ABSHIER NO.:  [id number]   MEDICAL RECORD NO.:  [id number]          PATIENT TYPE:  INP   LOCATION:  5511                         FACILITY:  [facility]   PHYSICIAN:  Carlus Pavlov, M.D. DATE OF BIRTH:  July 29, 1945   DATE OF ADMISSION:  08/10/2007  DATE OF DISCHARGE:  08/16/2007                               DISCHARGE SUMMARY   ADDENDUM DISCHARGE SUMMARY   CONSULTATIONS:  The consulting physician was Sherilyn Banker, M.D.      Carlus Pavlov, M.D.  Electronically Signed     CG/MEDQ  D:  08/18/2007  T:  08/19/2007  Job:  [id number]

## 2010-11-05 NOTE — Assessment & Plan Note (Signed)
Jonathon Galvan is back regarding his phantom and residual stump pain. He is  doing fairly well there at this point. He had some adjustments made to  his suspension system and is using sleeve suspension, and this seems to  be working a bit better. Pain will range from a 5-10/10 depending amount  of activity.   REVIEW OF SYSTEMS:  The patient reports occasional swelling and anxiety,  but otherwise stable. Full reviews in the written health and history  section.   SOCIAL HISTORY:  Patient is single and lives alone and he has a friend  who assists him intermittently.   PHYSICAL EXAMINATION:  Blood pressure 138/80, pulse 84, respiratory rate  16, satting 96% on room air. Patient is pleasant; alert and oriented  times three. Affect is bright and appropriate. He walks less antalgic  today and is moving much better overall.  HEART: Regular.  CHEST: Clear.  ABDOMEN: Soft, nontender.  No focal upper extremity neurological signs are noted today. He has good  grip strength in movement of the hands on the left as well as the right.   ASSESSMENT:  (1) Left above knee amputation with residual stump pain.  (2) Phantom pain.   PLAN:  (1) Continue MS Contin 30 mg q.12 hours #60. I gave him a second  months' prescription as well today. (2) Refill Percocet one q.8 hours  p.r.n. with a second months' prescription. (3) Continue Neurontin for  now as he is using this apparently due to restrictions within his health  care pharmacy plan. (4) I will see him back in about four months with  nurse clinic followup in two months' time.      Ranelle Oyster, M.D.  Electronically Signed     ZTS/MedQ  D:  05/07/2007 16:28:35  T:  05/08/2007 18:31:12  Job #:  [id number]

## 2010-11-05 NOTE — Discharge Summary (Signed)
NAME:  Jonathon Galvan, Jonathon Galvan NO.:  [id number]   MEDICAL RECORD NO.:  [id number]          PATIENT TYPE:  INP   LOCATION:  5511                         FACILITY:  [facility]   PHYSICIAN:  Olene Craven, M.D.  DATE OF BIRTH:  07-Jan-1946   DATE OF ADMISSION:  08/10/2007  DATE OF DISCHARGE:  08/16/2007                               DISCHARGE SUMMARY   DISCHARGE DIAGNOSES:  1. Diabetic ketoacidosis with altered mental status.  1'. Extreme hypernatremia, max 168 corr. Na during this admission.  1. New diagnosis of diabetes mellitus.  2. Congestive heart failure with an ejection fraction of 20%-25%.  3. Peripheral vascular disease.  4. History of cocaine/tobacco/alcohol abuse.  5. Left above-the-knee amputation.  6. Hepatitis-C.  7. Hyperlipidemia.  8. Hypertension.  9. Asthma.  10.Allergic rhinitis.   DISCHARGE MEDICATIONS:  1. Venlafaxine 50 mg orally daily.  2. Amitriptyline 100 mg orally q.h.s.  3. Niacin 500 mg orally daily.  4. Lipitor 10 mg orally daily.  5. Lasix 40 mg orally daily.  6. Omeprazole 20 mg orally twice daily.  7. Enalapril 20 mg orally daily.  8. Coreg 25 mg orally daily.  9. Lantus 15 units subcu q.h.s.  10.NovoLog per sliding scale with 4 units injected before meals.   CONDITION ON DISCHARGE:  Improved, stable.   CONSULTATIONS:  The consulting physician was Sherilyn Banker, M.D.   FOLLOWUP:  1. Follow up with Dr. Olene Craven on August 26, 2007, at 2 p.m.  2. Follow up with Jamison Neighbor August 26, 2007, at 2:30 p.m.  Followup      should include CBC for thrombocytopenia which developed over the      last few days of admission (platelets 90).  CBG as well for new      diagnosis of diabetes mellitus.   PROCEDURES:  1. A CT of the head on August 10, 2007, normal.  2. Chest x-ray on August 10, 2007:  Widened severe mediastinum,      mediastinal mass effect not excluded.  Needs an upright PA and      lateral.  3. CT on August 11, 2007:  No  acute process in the chest, probable      calcification along the right hemidiaphragm, probably secondary to      prior hemithorax, mild cardiophrenic angle and sub-diaphragmatic      adenopathy, likely related to liver process, a 5 mm right lung base      nodule warrants followup at approximately 12 months.  4. CT of the abdomen on August 11, 2007:  Suspicious for cirrhosis,      indeterminate liver lesion.  Follow-up abdominal MRI should be      considered.  Cholelithiasis.  5. CT of the pelvis on August 11, 2007:  Normal bowel loops, small      prostatomegaly.  6. MRI of the brain on August 11, 2007:  Mild chronic microvascular      disease and mild atrophy.  No acute abnormalities.  7. Chest x-ray on August 12, 2007:  Bibasilar volume loss, definite      congestive heart failure.  Percutaneous indwelling central catheter      line placed.  Arterial line placed.   CONSULTATIONS:  Cardiology regarding mild elevation and cardiac enzymes,  with left bundle branch block.  Cardiology felt that no further workup  was needed in this setting, given the downward trending enzymes, in the  setting of acute renal failure.   HISTORY OF PRESENT ILLNESS:  The patient is a 65 year old male with a  past medical history of congestive heart failure and with an  echocardiogram in June 2007, with an ejection fraction of 20%-25%, who  presented to the emergency room with blood sugars over 1000,  unresponsive.  His sister noted that she spoke with him about five days  PTA and he said he was not feeling well, but did not say what was  bothering him, other than he indicated that he was weak.  She spoke with  him on the phone again three days PTA, and he did not sound like himself  and said he did not feel very good.  She went to visit him the day of  admission and found him unresponsive in his home.   PHYSICAL EXAMINATION:  VITAL SIGNS:  Temperature 97.3 degrees, going  up  to 101.5 degrees, blood  pressure 146/103, pulse 123, respirations 32, O2  saturation 95% on 2 liters.  GENERAL:  Minimally responsive to pain.  HEENT:  Eyes:  Mildly icteric.  Pupils 5 mm bilaterally, very sluggish.  ENT:  Will not allow me to open his mouth.  NECK:  No jugular venous distention, no bruits.  LUNGS:  Clear to auscultation bilaterally.  Anteriorly decreased air  movement, not tachypneic.  CARDIOVASCULAR:  Tachycardic, regular rhythm.  A systolic ejection  murmur 2/6, best heard at the right sternal border.  ABDOMEN:  Hyperactive bowel sounds, soft, nondistended but tender to  palpation in the right upper quadrant and the right side with positive  mild guarding.  EXTREMITIES:  Left AKA, right lower extremity edema, 2+ dorsalis pedis  pulse.  SKIN:  No rashes, mild ecchymosis on the forehead on the left side.  LYMPH:  No cervical lymphadenopathy.  MUSCULOSKELETAL:  Moving all extremities.  NEUROLOGIC:  Hyperflexion globally.  Pupils sluggish 5 mm.  Positive  responsive to pain initially, then post-Narcan said no to every question  asked.   LABORATORY DATA:  On admission BMET:  Sodium 145, corrected to 160,  potassium 5.1, chloride 94, bicarb 24, BUN 74, creatinine 3.5, glucose  1108.  White blood cell count 21.2, H&H 19.4 and 60.5 respectively,  platelets 234, ANC 17.4, MCV 87.  Anion gap was 27, bilirubin 2.7,  alkaline phosphatase 90, AST 46, ALT 45, protein 7.7, albumin 2.5,  calcium 9.4.  Alcohol less than 5.  Lactic acid 4.2.  Urine drug screen  was positive for opiates.  Specific gravity on urinalysis was 1.024,  ketones 15, leukocyte esterase and nitrites negative.  D-dimer 0.44.   HOSPITAL COURSE:  1. Altered mental status:  The patient came into the emergency      department unresponsive, down for an unknown amount of time.      Initial glucose was 1108, and it was thought that the altered      mental status was due to DKA.  The patient did not have a diagnosis      of diabetes.   Otherwise he is up-to-date with health care followup.      His correct serum sodium three hours after admission was 168.  Initial ABG was normal with elevated bicarb at 27.  A workup for      sepsis, a CT of the abdomen and chest, as well as a chest x-ray      were negative for acute infectious process.  The patient was      febrile at 101.5 degrees early during admission.  He had a white      count of 21.2.  Blood cultures were drawn.  Urinalysis is negative      for infection.  Flu screen was negative.  A CT and MRI of the head      showed no lesions suspicious for a CVA.  The altered mental status      also could be secondary to drug ingestion.  Alcohol was less than      5.  Urine drug screen was positive for opiates, which was expected.      Methanol and ethylene glycol were drawn 12 hours after admission      and found to be positive for propylene glycol at a level consistent      with the excitement used for sedation before the MRI.  A myocardial      infarction was ruled out with only mildly elevated cardiac enzymes      and negative electrocardiograms, as well as a consultation from      cardiology.  The patient was initially started on vancomycin and      Zosyn and Rocephin (in the emergency room).  He was given Narcan      which initially increased his level of alertness, but briefly.      Over the course of the next 24 hours, the patient regained mental      function.  It was felt at that time that neither LP or more Narcan      were necessary.  Within the first three days the patient's mental      function had returned completely to his baseline.  2. Acute renal failure:  Likely this is pre-renal.  Admission      creatinine was 3.5 (with baseline at 0.6, six months prior).      Fractional excretion of sodium was 0.19%.  This acute renal failure      resolved with a creatinine of 0.93 at discharge.  3. Left bundle branch block:  We did not know when this evolved.  He       had mildly elevated troponin at 0.09 with normal CK-MB on      admission.  This bumped up to 0.14, but then trended back down.      Cardiology was consulted and they determined that the elevation was      likely secondary to acute renal failure.  4. Increased liver function tests:  Increased total bilirubin and      white count with right upper quadrant on exam.  Abdominal CT      positive for cholelithiasis and probable cirrhosis.  The LT trended      down over the next days and abdominal pain was no longer present.  5. Lactic acidemia (4.2).  This was possibly thought to be from bowel      ischemia, versus sepsis; however, the patient was not acidotic.      May have been from combined metabolic acidosis and metabolic      alkalosis, secondary to dehydration.  6. Leukocytosis with fever, of questionable etiology:  Treated with      broad spectrum antibiotics,  especially given the fever.  Treated      empirically for meningitis.  It resolved with hydration slowly over      the admission, (and the patient was afebrile after admission day).      Discharge white blood cell count 11.9.  Blood cultures had no      growth.  7. Hyperglycemia:  No history of diabetes.  The patient's glucose was      decreased slowly with an insulin drip with a Glucommander for the      first few days.  It never resolved completely and the patient was      sent home with CBG ranging from 150-300 on Lantus 15 units q.h.s.      and sliding scale insulin with 4 units injected, before meal      coverage with NovoLog, as well as diabetes education and followup      with [facility].  8. Thrombocytopenia:  Developed during the last two days of admission      with discharge platelets at 90.  He felt this was secondary to      Neurontin, which was started on August 14, 2007, for his chronic      left stump pain.  (He was on this prior to admission.)  If his      thrombocytopenia is  resolved at the next follow-up appointment, it      is felt that he could possibly restart this.   DISCHARGE PHYSICAL EXAMINATION:  VITAL SIGNS:  T-Max 99 degrees, blood  pressure 129/77, pulse 84, respirations 20, 98% on room air.   DISCHARGE LABORATORY DATA:  CBG 138, 215, 171 and 245.  White blood  count 9.3, H&H 11.9, 34.9 respectively, platelets 90.  BMET:  Sodium  140, potassium 3.6, chloride 110, CO2 of 24, BUN 7, creatinine 0.93,  glucose 234, calcium 7.8.   PENDING LABORATORY DATA:  None.   Dictated by Dorise Bullion for Dr. Elvera Lennox.      Carlus Pavlov, M.D.  Electronically Signed     ______________________________  Olene Craven, M.D.    CG/MEDQ  D:  08/17/2007  T:  08/18/2007  Job:  [id number]

## 2010-11-08 NOTE — Assessment & Plan Note (Signed)
[name] is here in followup of his left above knee amputation and  chronic pain.  The cold weather has really increased his pain symptoms,  particularly at nighttime.  He has also had some problems with his  socket with revision this Fall.  It sounds as if he is still having some  rotation within the socket and is due for another assessment by  Prosthetics today.  His pain ranges from a 7 to 8 out of 10.  He does  feel that the OxyContin helps at 40 mg every 12 hours.  Lyrica, Elavil  and Percocet are also helpful for the interval pain in addition to the  Neurontin.  He does shy away from touching the leg while it hurts and he  does not use the prosthesis when it bothers him either.  He tried to do  some massage and some desensitization, but is not regular with this.   REVIEW OF SYSTEMS:  Positive for numbness, tremor, night sweats,  constipation, limb swelling.  Other pertinent positives above are fully  reviewed in the health and history section.   SOCIAL HISTORY:  The patient is single and lives alone.  He smokes 10  cigarettes a day.   PHYSICAL EXAMINATION:  VITAL SIGNS:  Blood pressure is 153/76.  Pulse is  92.  Respiratory rate is 16.  He is satting at 96% on room air.  GENERAL:  The patient is pleasant, in no acute distress.  He is alert  and oriented times 3.  Affect is bright and appropriate and he is  actually walking with his prosthesis today with a limp.  I did not  remove the leg from the socket today.  HEART:  Regular.  CHEST:  Clear.  ABDOMEN:  Soft and nontender.   ASSESSMENT:  1. Left above-knee amputation with residual limb pain.  2. Left lower extremity phantom pain.   PLAN:  1. Add Lidoderm patch to the distal stump nightly at bedtime, on 12      hours, off 12 hours.  2. OxyContin 40 mg every 12 hours.  3. Percocet 10/650 1 every 8 hours p.r.n.  I dispensed #120 OxyContin      20 mg and #80 Percocet tablets today.  4. Maintain Lyrica 75 mg t.i.d. and  Neurontin 300 mg t.i.d.  5. May benefit from TENS trial versus cream.  6. We will see the patient back in the __________ nurseClinic in one      month's time.  I will see him back in 3 months.  Await prosthetic      evaluation as well.      Jonathon Galvan, M.D.  Electronically Signed     ZTS/MedQ  D:  06/26/2006 12:45:31  T:  06/26/2006 13:20:20  Job #:  [id number]

## 2010-11-08 NOTE — Assessment & Plan Note (Signed)
Jonathon Galvan is back regarding his left stump pain and phantom symptoms.  He has  actually stayed with the medications we have prescribed at last visit, which  are Lyrica 75 mg b.i.d., OxyContin 40 mg q.12h, Percocet 10/650 one q.8h  p.r.n., Elavil 50 mg q.h.s.  His family physician stopped his Neurontin  without consulting [place].  He has noticed an increase in his pain without the  Neurontin.  Currently, his pain is a 10, but he states it is noticeably  better.  It still bothers him more so when he is up and about as well as in  the evening hours.  He describes his pain as sharp, burning, stabbing,  tingling, and aching.  The pain is more at the distal stump now than phantom  pain.  He states that he is sleeping about five or six hours a night.  The  pain will awaken him.  He can walk about 45 minutes at a time without having  to stop.  He says that this does increase his pain quite dramatically.  He  is working with outpatient physiotherapy on his gait.   REVIEW OF SYSTEMS:  The patient denies any new neurological, psychiatric,  constitutional, GU, GI, or cardiorespiratory complaints.  He does have a  nurse, who is helping him with his medications.   SOCIAL HISTORY:  The patient is single.  Other pertinent positives are  listed above.   PHYSICAL EXAMINATION:  VITAL SIGNS:  Blood pressure is 153/64, pulse of 76,  respiratory rate 17, his saturation is 97% on room air.  GENERAL:  The patient is pleasant and in no acute distress.  PAIN AND REHAB EVALUATION:  He is alert and oriented x3.  Affect is bright  and appropriate.  Gait is slightly antalgic on the left leg.  Coordination  is fair.  Reflexes are 2-plus.  Sensation is notable for hyperalgesia at the  left above-knee stump.  He seemed to have better pain tolerance today  compared to last visit, however.  Skin is intact with no breakdown.  His  most sensitive area is a residual femur tip.  Cognitively, the patient is  more appropriate and  focused.  He is hard-of-hearing, which confuses things  at times.   ASSESSMENT:  1.  Left-sided above-knee amputation with residual limb pain.  2.  Left lower extremity phantom pain.   PLAN:  1.  Increase Elavil to 100 mg q.h.s.  2.  Resume Neurontin at 300 mg t.i.d.  3.  Continue OxyContin 40 mg q.12h, #60, Percocet 10/650 one q.8h p.r.n.,      #90, and Lyrica 75 mg b.i.d.  4.  Encouraged ongoing desensitization activities and use of leg.  5.  Continue physical therapy.  6.  I will see the patient back in two months time.  He will see the Nurse      Clinic in one month.      Ranelle Oyster, M.D.  Electronically Signed     ZTS/MedQ  D:  11/10/2005 12:02:40  T:  11/10/2005 04:54:09  Job #:  [id number]

## 2010-11-08 NOTE — Assessment & Plan Note (Signed)
Jonathon Galvan is back regarding his left above knee amputation and pain  syndrome.  He is awaiting his permanent socket, apparently at the end of  this week or beginning of next.  His current regimen seems to work fairly  well for pain.  He ran a bit short before seeing [place] here today.  The pain is  worse in the evening and early morning hours.  The pain is worse the longer  he wears his socket.  He has areas of pressure and redness when he wears his  socket for prolonged periods of time.  He rates his pain as 9 out of 10  today, described as burning and stabbing.  The pain interferes with general  activity, relationship with others, and enjoyment of life on a 7 to 9 out of  10 level.  Sleep is fair.  The pain definitely increases the more he walks  on the leg.   REVIEW OF SYSTEMS:  The patient reports trouble walking.  He denies other  constitutional, GU, GI, or cardiorespiratory complaints other than those  mentioned above.   SOCIAL HISTORY:  The patient has an aide at home and lives in a high rise  adult apartment complex.   PHYSICAL EXAMINATION:  VITAL SIGNS:  Blood pressure 124/68, pulse 66,  respiratory rate 16, and he is saturating 96% on room air.  GENERAL:  The patient is stoic and sometimes difficult to converse with.  His memory is poor still.  His left leg is tender to palpation still,  particularly at the distal femur.  Skin is intact.  He has some trace  swelling still at the residual limb.  Mood was definitely decreased from our  last visit.  HEART:  Regular.  CHEST:  Clear.  ABDOMEN:  Soft and nontender.   ASSESSMENT:  1. Left above knee amputation with residual limb pain.  2. Left lower extremity phantom pain.   PLAN:  1. I will make no changes today as he is doing fairly well with the      current regimen.  The most important issue is getting appropriately      fitting socket which is taking place.  Hopefully as this happens and he      is on this leg for longer  periods of time, this will decrease his limb      pain.  Goal is to decrease his OxyContin in the longterm as well as his      Percocet usage.  2. For now, we will continue OxyContin 20 mg two tablets q.12 hours #120,      Percocet 10/650 one q.8 hours p.r.n. #80, Elavil 100 mg q.h.s., Lyrica      75 mg t.i.d., and Neurontin 300 mg t.i.d.  3. I will see the patient's back in 3 months' time.  He will see the RN      Clinic in 1 months' time.      Ranelle Oyster, M.D.  Electronically Signed     ZTS/MedQ  D:  04/01/2006 10:30:43  T:  04/02/2006 22:51:39  Job #:  [id number]

## 2010-11-08 NOTE — Assessment & Plan Note (Signed)
Jonathon Galvan is back regarding his phantom leg pain on the left.  We increased  his Lyrica at last visit and he was unable to tolerate the increase and  therefore, he stopped the medication.  He has not been taking the OxyContin  as I prescribed, nor has he been taking the oxycodone.  He states he does  not like the oxycodone as was not as helpful as the Percocet he has used.  Apparently, he was using Percocet 10 mg previously.  The patient is  receiving therapy at our outpatient center.  The patient rates his pain as a  9 out of 10 and describes it as burning, tingling, and aching.  The pain  interferes with general activity, relations with others, enjoyment of life  on a moderate to severe level.  Sleep is poor.  The patient is also on  Elavil 50 mg q.h.s. for rest.   REVIEW OF SYSTEMS:  The patient reports trouble walking, tremor, and  tingling, depression, poor appetite.  A full review of systems is in the  health and history section of the chart.   SOCIAL HISTORY:  The patient is single and smoking daily.  He has caregivers  assisting him at home.   PHYSICAL EXAMINATION:  VITAL SIGNS:  Blood pressure is 153/84, pulse is 86,  respiratory rate is 17.  He is sating 97% on room air.  GENERAL:  The patient is in mild to moderate distress.  MUSCULOSKELETAL:  Gait is antalgic to the left today.  Coordination is fair.  Reflexes are 1+.  Sensation is decreased somewhat in the distal right lower  extremity.  The left leg remains hyper sensitive to the touch.  MENTAL STATUS:  The patient was very anxious and irritable at times today.   ASSESSMENT:  Left phantom and residual stump pain.   PLAN:  1.  Resume Lyrica titrating up to 75 mg b.i.d. where he was tolerating the      medication fairly well last visit.  I encouraged appropriate attempts at      communication with our office if any problems or questions arise.  2.  Refill OxyContin 40 mg q.12h.  3.  Add Percocet 10/650 for breakthrough  pain, one q.8h. p.r.n.  4.  The patient may continue his Neurontin as well as Elavil.  5.  Continue therapy.  6.  I will see the patient back in about one month's time.      Ranelle Oyster, M.D.  Electronically Signed     ZTS/MedQ  D:  10/13/2005 16:59:15  T:  10/14/2005 16:28:35  Job #:  [id number]

## 2010-11-08 NOTE — Assessment & Plan Note (Signed)
INTERVAL HISTORY:  Mr. Litaker is back regarding his left-sided leg pain and  phantom symptoms.  [name] has been feeling much better.  He has been  working with physical therapy on his gait.  He has been wearing his  prosthesis 8 hours or more a day.  He is on OxyContin 40 mg q.12 h.  currently as well as Percocet 10/650 one q.8 h. p.r.n. for breakthrough  pain.  Lyrica 75 mg b.i.d. and Neurontin 300 mg t.i.d.  Elavil is 100 mg  q.h.s.  Sleep has improved.  He rates his pain 8/10 today, although he does  not have the appearance of that level of pain.  His pain interferes with  general activity, relations with others and enjoyment of life on a moderate  level.  Phantom symptoms seem much improved.  He is wearing 15-ply of sock  on his current socket.   REVIEW OF SYSTEMS:  The patient is without changes today.  He has an aid  that helps him at home.   SOCIAL HISTORY:  Patient has an aide at home.   PHYSICAL EXAMINATION:  Blood pressure is 148/81, pulse is 73, respiratory  rate 16, he is saturating 95% in room air.  The patient is pleasant in no  acute distress.  He is alert and oriented x3.  Affect is bright and  appropriate.  Gait is stable with prosthesis.  He does tend to lean still a  bit to the left but this expected.  Left residual limb is minimally tender  on the distal femur.  No skin breakdown is seen.  He has some trace swelling  and redundant tissue although the size of his limb has changed dramatically.  Mood is excellent.  He was smiling and laughing today.  He was carrying on a  coherent conversation and was much aware of my recommendations and words.   ASSESSMENT:  1.  Left-sided above-knee-amputation with residual limb pain.  2.  Left lower extremity phantom pain.   PLAN:  1.  We will keep medications the same for now except decrease Percocet to      #80.  He will use the Percocet 10/650 one q.8 h. p.r.n., OxyContin 40 mg      q.12 h. scheduled, Lyrica 75 mg b.i.d.,  Neurontin 300 mg t.i.d., Elavil      100 mg q.h.s.  2.  Continue ongoing desensitization of left limb.  He can continue      increasing his socket wear as tolerated over the next few weeks' time.  3.  He will eventually get a new smaller socket.  4.  I will see the patient back in 3 months' time.  He will see the RN      clinic in 1 month.      Ranelle Oyster, M.D.  Electronically Signed     ZTS/MedQ  D:  01/05/2006 15:55:15  T:  01/05/2006 23:22:40  Job #:  [id number]

## 2010-11-08 NOTE — Group Therapy Note (Signed)
[name] is here for evaluation of left lower extremity pain.   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  The  patient had a problem with persistent leg pain since the surgery.  Apparently he had no complications with healing.  He complains of  significant phantom pain which bothers him mostly in the morning and evening  hours.  He rates the pain at a 10/10 at its worst.  The patient also  complains of some stump pain as well as which is a much smaller component  overall to his picture.  He states that his stump pain is approximately 20%  and phantom pain is 80% of the total pain picture.  He is on Trileptal  currently 300 mg at bedtime as well as Neurontin 300 mg t.i.d. and Flexeril  5 mg twice a day.  He also tells me he takes oxycodone 5 mg twice a day  although I do not have that listed on the documentation available to me.  He  is in the process of having his socket adjusted by [facility] as  he is having some pain in the gluteal and inguinal folds.  He denies any  skin breakdown on the distal limb.  Pain is described as sharp, burning,  intermittent, stabbing as well as aching.  It interferes with enjoyment of  life, relations with others and general activity on a 10/10 level.  Sleep is  fair.  He uses crutches, walker and wheelchair.  He currently is at an  assisted living facility but would like to graduate to an apartment of his  own.  The only thing really holding him back is balance and endurance on the  current leg.   PAST MEDICAL HISTORY:  1.  Positive for left above knee amputation.  2.  Hypertension.  3.  Hepatitis C.  4.  CHF.  5.  Anemia.  6.  Neuropathic pain.  7.  History of septic arthritis with MRSA.  8.  History of IV drug abuse.   CURRENT MEDICATIONS:  1.  Prevacid 30 mg a day.  2.  Lipitor 10 mg a day.  3.  Norvasc 10 mg a day.  4.   Elavil 25 mg nightly.  5.  Effexor 50 mg nightly.  6.  Iron sulfate 325 mg a day.  7.  Lasix 40 mg daily.  8.  Coreg 25 mg daily.  9.  Vasotec 20 mg daily.  10. Trileptal 300 mg daily.  11. Flexeril 5 mg b.i.d.  12. Neurontin 300 mg three times a day.   ALLERGIES:  NO KNOWN DRUG ALLERGIES.   SOCIAL HISTORY:  Pertinent positives listed above.  Patient still smokes but  under a half pack per day currently.   FAMILY HISTORY:  Patient reports diabetes, hypertension and disability in  his family past.   REVIEW OF SYSTEMS:  Patient reports numbness, tingling, trouble walking,  history of fall in the past, coughing.   PHYSICAL EXAMINATION:  GENERAL APPEARANCE:  Patient is pleasant, no acute  distress.  He is slightly overweight.  He is alert and oriented x3.  Affect  is bright and appropriate.  VITAL SIGNS:  Blood pressure is 148/69, pulse 75, respiratory rate 16, he is  sating 96% on room air.  LUNGS:  Clear.  CARDIOVASCULAR:  Regular rate and rhythm.  ABDOMEN:  Soft and nontender.  EXTREMITIES:  On examination  of the left lower extremity, he had full range  of motion at the hip.  Strength was 5/5 generally.  Left leg surgical site  was clean, dry and intact.  No signs of breakdown or pressure points with  the socket.  He was tender along the anterior and distal femur.  No focal  masses or lumps were palpated today.  NEUROLOGIC:  The patient walks with his crutches and had fair balance.  He  tends to hyperextend left leg and favor the left with gait still at his  point.  He does shift fairly well to the left and right when cued.  Deep  tendon reflexes are 1+ to 2+ throughout.  Sensation generally intact.  Cognitively, the patient was appropriate today.  Strength overall was 5/5  elsewhere.   ASSESSMENT:  Status post left above knee amputation with chronic phantom  pain and stump pain.  Patient is currently on Elavil, Trileptal and  Neurontin at submaximal doses with poor results.   He uses oxycodone twice a  day which seems to be of the most benefit.  His socket appears to be fitting  fairly well in the distal portion of the limb.   PLAN:  1.  Will begin a trial of Lyrica titrating up to 75 mg t.i.d. over a week's      time.  2.  Will stop Trileptal.  3.  Continue Neurontin at current dose 300 mg t.i.d. as well as the Elavil      at bedtime.  4.  Will increase oxycodone 5 mg q.8h. p.r.n.  5.  Follow-up for socket revision with [facility].  6.  I will see the patient back in about one month's time.      Ranelle Oyster, M.D.  Electronically Signed     ZTS/MedQ  D:  08/18/2005 10:35:54  T:  08/18/2005 16:23:03  Job #:  [id number]   cc:   Roland Earl  [facility]  [place], [place]

## 2010-11-08 NOTE — Assessment & Plan Note (Signed)
HISTORY OF PRESENT ILLNESS:  [name] is back regarding his left sided  stump and phantom pain. We began a trial of Lyrica, which seemed to help the  symptoms. He titrated up to 75 mg t.i.d. without problems. We stopped his  Trileptal and continued Neurontin. He did not tell me last time that he was  on OxyContin extended release 40 mg q.12 hours. That ran out a week ago. He  received that from another physician before he came over here. He had been  doing fairly well until the OxyContin ran out. He ran out of his Oxycodone  that I gave him a few days ago. The patient has a socket revised per  [facility] and this seems to be fitting fairly well. The patient  has some difficulty sleeping due to his pain at this point. Often the pain  will awaken him in the middle of the night. The patient rates his pain an 8  out of 10, describes it as sharp and constant. It interferes with his  general activity, relations with others, enjoyment of life on a moderate to  severe level.   REVIEW OF SYSTEMS:  The patient reports weakness, tingling, trouble walking,  limb swelling, poor appetite, and labile sugars.   SOCIAL HISTORY:  The patient is single and living alone, although his sister  helps him a bit with the organization of his medications, etc.   PHYSICAL EXAMINATION:  VITAL SIGNS:  Blood pressure 179/87, pulse 89,  respiratory rate 17. He is satting 98% on room air.  GENERAL:  Pleasant. No acute distress.  NEUROLOGIC:  Alert and oriented times three. Affect bright and appropriate.  Gait was antalgic to the left side.  EXTREMITIES:  He seemed to have good suction and fit of the socket on the  limb today. There was trace edema around residual limb. The limb was painful  on the distal tip of the tibia. I palpated no neuromas or nodules around the  wound line. Wound was intact and no signs of skin breakdown were seen.  HEART:  Regular rate and rhythm.  LUNGS:  Clear.  ABDOMEN:  Soft,  nontender.  MUSCULOSKELETAL:  Motor examination was within normal limits and patient had  good active and passive range of motion.   ASSESSMENT:  Left phantom and residual stump pain.   PLAN:  1.  Increase Lyrica ultimately up to 150 mg t.i.d.  2.  I refilled his OxyContin 40 mg q.12 hours, which also will be helpful      here in this picture.  3.  Continue Neurontin 300 mg t.i.d.  4.  I increased Elavil to 50 mg nightly.  5.  I urged desensitization and ambulation with the limb.  6.  I will see the patient back in 1 months time.      Ranelle Oyster, M.D.  Electronically Signed     ZTS/MedQ  D:  09/15/2005 10:41:18  T:  09/16/2005 16:16:31  Job #:  [id number]   cc:   Roland Earl  Fax: [phone]   [facility]  [place], [place].

## 2010-11-08 NOTE — Assessment & Plan Note (Signed)
FOLLOWUP OFFICE NOTE   Adnan is back regarding his left above knee amputation.  He has been  fitted with a new suspension system using a waist belt sleeve  suspension.  The shuttle pin-lock setup was not working for him due to  excessive contact around the pin.  He is much more happy with the sleeve  and waist belt suspension.  He rates his pain on average a 7 out of 10.  His phantom pain is most bothersome in the morning hours when he first  awakens.  He describes the pain as sharp, tingling and aching.  Pain  interferes with general activity, relations with others and enjoyment of  life on a moderate level.  He is walking regularly during the day.  He  goes out and shops, etc.  He can walk about 30 minutes without having to  stop.  He remains on OxyContin 40 mg q.12 hours as well as Lyrica,  Elavil and Percocet.  He has found a lot of satisfaction and support out  of going to the amputee support group meetings monthly.   SOCIAL HISTORY:  Patient is without significant change.  Continues to  smoke regularly.   REVIEW OF SYSTEMS:  Positive for numbness, tremor, trouble walking, limb  swelling, coughing, some weight gain.   PHYSICAL EXAMINATION:  Blood pressure is 160/90, pulse is 80,  respiratory rate 16, sating 95% on room air.  The patient is pleasant,  in no acute distress.  He is alert and oriented times three.  Affect is  bright and appropriate.  Patient walks with a limp, although he tends to  not shift weight completely to the left side and there is elevation of  left hemipelvis still.  Left leg was stable from a standpoint of  appearance.  No skin breakdown was seen.  Heart was regular.  Chest was  clear.  Abdomen soft, nontender.   ASSESSMENT:  1. Left above knee amputation with residual limb pain that is improved      with new suspension system.  2. Left lower extremity phantom pain.   PLAN:  1. Continue Lidoderm patch to stump as needed daily.  2. Refill OxyContin  40 mg q.12 hours.  3. Refill Percocet 10/650 1 q.8. hours p.r.n.  Dispensed 120 OxyContin      and 80 Percocet today.  4. Continue Lyrica 75 t.i.d. and Neurontin 300 t.i.d.  5. Patient will follow up in the nurse clinic in 1 month's time.  I      will see him back in 3 months.  I did mention to him that he may      benefit from mirror therapy for his left-sided phantom pain.      Ranelle Oyster, M.D.  Electronically Signed     ZTS/MedQ  D:  09/08/2006 12:39:03  T:  09/08/2006 17:59:13  Job #:  [id number]

## 2011-01-15 ENCOUNTER — Other Ambulatory Visit: Payer: Self-pay | Admitting: *Deleted

## 2011-01-15 DIAGNOSIS — E785 Hyperlipidemia, unspecified: Secondary | ICD-10-CM

## 2011-01-15 DIAGNOSIS — F329 Major depressive disorder, single episode, unspecified: Secondary | ICD-10-CM

## 2011-01-15 MED ORDER — NIACIN 500 MG PO TABS: 500.0000 mg | ORAL_TABLET | Freq: Every day | ORAL | Status: DC

## 2011-01-15 MED ORDER — ZOLPIDEM TARTRATE 5 MG PO TABS: 10.0000 mg | ORAL_TABLET | Freq: Every day | ORAL | Status: DC

## 2011-01-15 NOTE — Telephone Encounter (Signed)
Rxs refilled; request forms faxed to [facility].

## 2011-01-16 ENCOUNTER — Other Ambulatory Visit: Payer: Self-pay | Admitting: *Deleted

## 2011-01-16 DIAGNOSIS — F329 Major depressive disorder, single episode, unspecified: Secondary | ICD-10-CM

## 2011-01-16 MED ORDER — ZOLPIDEM TARTRATE 10 MG PO TABS: 10.0000 mg | ORAL_TABLET | Freq: Every day | ORAL | Status: AC

## 2011-01-16 NOTE — Telephone Encounter (Signed)
For insurance purpose, [facility] request Ambien be change to 10mg  daily at bedtime instead of 5mg  2 tablets at bedtime.

## 2011-01-16 NOTE — Telephone Encounter (Signed)
Ambien rx called to [facility] .

## 2011-03-13 ENCOUNTER — Other Ambulatory Visit: Payer: Self-pay | Admitting: *Deleted

## 2011-03-13 DIAGNOSIS — F329 Major depressive disorder, single episode, unspecified: Secondary | ICD-10-CM

## 2011-03-13 DIAGNOSIS — E785 Hyperlipidemia, unspecified: Secondary | ICD-10-CM

## 2011-03-13 DIAGNOSIS — I10 Essential (primary) hypertension: Secondary | ICD-10-CM

## 2011-03-13 NOTE — Telephone Encounter (Signed)
Last OV 4.27.12; BMP 3.26.12

## 2011-03-13 NOTE — Telephone Encounter (Signed)
Talked to Jonathon Galvan, he stated his primary doctor is Dr. Marcelline Deist Hassan([phone]). I called his office for fax#[phone]) and will fax medication refill request form. And let [facility] know also.

## 2011-03-13 NOTE — Telephone Encounter (Signed)
According to Dr. Sumner Boast note on 10/18/10, patient indicated that he was transferring his care to another practice.  As noted below, patient confirmed that today to refill nurse, who will forward the refill request to his current PCP and will also notify [facility].

## 2011-03-14 LAB — CBC
HCT: 60.5 — ABNORMAL HIGH
HCT: 40.8
Hemoglobin: 19.4 — ABNORMAL HIGH
MCHC: 34
MCHC: 34.2
MCV: 83.4
MCV: 84.2
MCV: 85.2
Platelets: 234
Platelets: 110 — ABNORMAL LOW
Platelets: 151
Platelets: 90 — ABNORMAL LOW
RBC: 4.85
RDW: 13.5
RDW: 12.3
RDW: 13.3
WBC: 21.2 — ABNORMAL HIGH
WBC: 15.2 — ABNORMAL HIGH
WBC: 19.3 — ABNORMAL HIGH
WBC: 9.3
WBC: 9.4

## 2011-03-14 LAB — BASIC METABOLIC PANEL
BUN: 76 — ABNORMAL HIGH
BUN: 16
BUN: 17
BUN: 24 — ABNORMAL HIGH
BUN: 25 — ABNORMAL HIGH
BUN: 27 — ABNORMAL HIGH
BUN: 28 — ABNORMAL HIGH
BUN: 29 — ABNORMAL HIGH
BUN: 37 — ABNORMAL HIGH
BUN: 38 — ABNORMAL HIGH
BUN: 43 — ABNORMAL HIGH
BUN: 46 — ABNORMAL HIGH
BUN: 51 — ABNORMAL HIGH
BUN: 7
BUN: 70 — ABNORMAL HIGH
BUN: 9
CO2: 22
CO2: 23
CO2: 24
CO2: 24
CO2: 25
CO2: 25
CO2: 26
CO2: 26
CO2: 27
CO2: 27
CO2: 28
CO2: 28
CO2: 29
CO2: 29
Calcium: 7.6 — ABNORMAL LOW
Calcium: 7.7 — ABNORMAL LOW
Calcium: 7.8 — ABNORMAL LOW
Calcium: 8 — ABNORMAL LOW
Calcium: 8 — ABNORMAL LOW
Calcium: 8 — ABNORMAL LOW
Calcium: 8 — ABNORMAL LOW
Calcium: 8.2 — ABNORMAL LOW
Calcium: 8.3 — ABNORMAL LOW
Calcium: 8.4
Calcium: 8.6
Calcium: 8.7
Chloride: 102
Chloride: 110
Chloride: 111
Chloride: 117 — ABNORMAL HIGH
Chloride: 118 — ABNORMAL HIGH
Chloride: 121 — ABNORMAL HIGH
Chloride: 122 — ABNORMAL HIGH
Chloride: 122 — ABNORMAL HIGH
Chloride: 122 — ABNORMAL HIGH
Chloride: 124 — ABNORMAL HIGH
Chloride: 124 — ABNORMAL HIGH
Chloride: 125 — ABNORMAL HIGH
Chloride: 125 — ABNORMAL HIGH
Chloride: 126 — ABNORMAL HIGH
Chloride: 127 — ABNORMAL HIGH
Chloride: 128 — ABNORMAL HIGH
Creatinine, Ser: 3.4 — ABNORMAL HIGH
Creatinine, Ser: 0.87
Creatinine, Ser: 0.93
Creatinine, Ser: 0.98
Creatinine, Ser: 1.12
Creatinine, Ser: 1.25
Creatinine, Ser: 1.36
Creatinine, Ser: 1.4
Creatinine, Ser: 1.41
Creatinine, Ser: 1.45
Creatinine, Ser: 1.61 — ABNORMAL HIGH
Creatinine, Ser: 1.66 — ABNORMAL HIGH
Creatinine, Ser: 1.76 — ABNORMAL HIGH
Creatinine, Ser: 1.94 — ABNORMAL HIGH
Creatinine, Ser: 2.06 — ABNORMAL HIGH
Creatinine, Ser: 2.71 — ABNORMAL HIGH
Creatinine, Ser: 2.83 — ABNORMAL HIGH
GFR calc Af Amer: 22 — ABNORMAL LOW
GFR calc Af Amer: 27 — ABNORMAL LOW
GFR calc Af Amer: 29 — ABNORMAL LOW
GFR calc Af Amer: 40 — ABNORMAL LOW
GFR calc Af Amer: 47 — ABNORMAL LOW
GFR calc Af Amer: 47 — ABNORMAL LOW
GFR calc Af Amer: 48 — ABNORMAL LOW
GFR calc Af Amer: 49 — ABNORMAL LOW
GFR calc Af Amer: 51 — ABNORMAL LOW
GFR calc Af Amer: 53 — ABNORMAL LOW
GFR calc Af Amer: 54 — ABNORMAL LOW
GFR calc Af Amer: 54 — ABNORMAL LOW
GFR calc Af Amer: 59 — ABNORMAL LOW
GFR calc Af Amer: >60
GFR calc Af Amer: >60
GFR calc Af Amer: >60
GFR calc non Af Amer: 18 — ABNORMAL LOW
GFR calc non Af Amer: 23 — ABNORMAL LOW
GFR calc non Af Amer: 24 — ABNORMAL LOW
GFR calc non Af Amer: 39 — ABNORMAL LOW
GFR calc non Af Amer: 39 — ABNORMAL LOW
GFR calc non Af Amer: 42 — ABNORMAL LOW
GFR calc non Af Amer: 45 — ABNORMAL LOW
GFR calc non Af Amer: 45 — ABNORMAL LOW
GFR calc non Af Amer: 53 — ABNORMAL LOW
GFR calc non Af Amer: 54 — ABNORMAL LOW
GFR calc non Af Amer: >60
GFR calc non Af Amer: >60
GFR calc non Af Amer: >60
Glucose, Bld: 180 — ABNORMAL HIGH
Glucose, Bld: 183 — ABNORMAL HIGH
Glucose, Bld: 194 — ABNORMAL HIGH
Glucose, Bld: 202 — ABNORMAL HIGH
Glucose, Bld: 203 — ABNORMAL HIGH
Glucose, Bld: 206 — ABNORMAL HIGH
Glucose, Bld: 209 — ABNORMAL HIGH
Glucose, Bld: 214 — ABNORMAL HIGH
Glucose, Bld: 234 — ABNORMAL HIGH
Glucose, Bld: 244 — ABNORMAL HIGH
Glucose, Bld: 245 — ABNORMAL HIGH
Glucose, Bld: 246 — ABNORMAL HIGH
Glucose, Bld: 312 — ABNORMAL HIGH
Potassium: 3.5
Potassium: 3.5
Potassium: 3.5
Potassium: 3.5
Potassium: 3.6
Potassium: 3.7
Potassium: 3.7
Potassium: 3.7
Potassium: 3.7
Potassium: 3.8
Potassium: 3.8
Potassium: 3.9
Potassium: 3.9
Potassium: 4
Potassium: 4.2
Potassium: 4.3
Potassium: 4.4
Potassium: 5.2 — ABNORMAL HIGH
Sodium: 137
Sodium: 141
Sodium: 152 — ABNORMAL HIGH
Sodium: 153 — ABNORMAL HIGH
Sodium: 154 — ABNORMAL HIGH
Sodium: 154 — ABNORMAL HIGH
Sodium: 155 — ABNORMAL HIGH
Sodium: 156 — ABNORMAL HIGH
Sodium: 156 — ABNORMAL HIGH
Sodium: 160 — ABNORMAL HIGH
Sodium: 160 — ABNORMAL HIGH
Sodium: 160 — ABNORMAL HIGH
Sodium: 161
Sodium: 161
Sodium: 164

## 2011-03-14 LAB — URINALYSIS, ROUTINE W REFLEX MICROSCOPIC
Leukocytes, UA: NEGATIVE
Nitrite: NEGATIVE
Specific Gravity, Urine: 1.034 — ABNORMAL HIGH
Urobilinogen, UA: 0.2
pH: 5

## 2011-03-14 LAB — DIFFERENTIAL
Basophils Absolute: 0
Eosinophils Absolute: 0
Lymphocytes Relative: 11 — ABNORMAL LOW
Lymphs Abs: 2.4
Neutrophils Relative %: 82 — ABNORMAL HIGH

## 2011-03-14 LAB — SODIUM, URINE, RANDOM: Sodium, Ur: <10

## 2011-03-14 LAB — COMPREHENSIVE METABOLIC PANEL
ALT: 45
AST: 39 — ABNORMAL HIGH
AST: 46 — ABNORMAL HIGH
Albumin: 2.1 — ABNORMAL LOW
Albumin: 2.7 — ABNORMAL LOW
Alkaline Phosphatase: 36 — ABNORMAL LOW
CO2: 24
Calcium: 9.4
Calcium: 9
Chloride: 94 — ABNORMAL LOW
Chloride: 111
Chloride: 127 — ABNORMAL HIGH
Creatinine, Ser: 3.52 — ABNORMAL HIGH
Creatinine, Ser: 1.82 — ABNORMAL HIGH
GFR calc Af Amer: 46 — ABNORMAL LOW
GFR calc Af Amer: >60
GFR calc non Af Amer: 18 — ABNORMAL LOW
Glucose, Bld: 1108
Potassium: 3.2 — ABNORMAL LOW
Sodium: 143
Total Bilirubin: 2.7 — ABNORMAL HIGH
Total Bilirubin: 0.8
Total Protein: 6.1

## 2011-03-14 LAB — POCT I-STAT 3, ART BLOOD GAS (G3+)
Acid-Base Excess: 2
O2 Saturation: 97
TCO2: 28
pCO2 arterial: 42.3

## 2011-03-14 LAB — GLUCOSE, RANDOM
Glucose, Bld: 403 — ABNORMAL HIGH
Glucose, Bld: 500 — ABNORMAL HIGH

## 2011-03-14 LAB — HEMOGLOBIN A1C

## 2011-03-14 LAB — GRAM STAIN

## 2011-03-14 LAB — INFLUENZA A & B ANTIBODIES
Influenza A Virus Ab, IgG: 0.51 IV
Influenza A Virus Ab, IgM: 0.15 IV
Influenza B Virus Ab, IgM: 0.33 IV

## 2011-03-14 LAB — AMMONIA: Ammonia: 27

## 2011-03-14 LAB — LACTIC ACID, PLASMA: Lactic Acid, Venous: 4.2 — ABNORMAL HIGH

## 2011-03-14 LAB — KETONES, QUALITATIVE

## 2011-03-14 LAB — CARDIAC PANEL(CRET KIN+CKTOT+MB+TROPI)
CK, MB: 1.7
CK, MB: 3.5
Relative Index: 1.4
Total CK: 191
Troponin I: 0.09 — ABNORMAL HIGH
Troponin I: 0.13 — ABNORMAL HIGH

## 2011-03-14 LAB — LEGIONELLA ANTIGEN, URINE

## 2011-03-14 LAB — APTT: aPTT: 22 — ABNORMAL LOW

## 2011-03-14 LAB — ACETAMINOPHEN LEVEL: Acetaminophen (Tylenol), Serum: <10 — ABNORMAL LOW

## 2011-03-14 LAB — CULTURE, BLOOD (ROUTINE X 2): Culture: NO GROWTH

## 2011-03-14 LAB — CORTISOL: Cortisol, Plasma: 44.7

## 2011-03-14 LAB — POCT CARDIAC MARKERS
CKMB, poc: <1 — ABNORMAL LOW
Myoglobin, poc: >500

## 2011-03-14 LAB — ETHYLENE GLYCOL

## 2011-03-14 LAB — RAPID URINE DRUG SCREEN, HOSP PERFORMED
Barbiturates: NOT DETECTED
Benzodiazepines: NOT DETECTED
Cocaine: NOT DETECTED
Opiates: POSITIVE — AB

## 2011-03-14 LAB — CK TOTAL AND CKMB (NOT AT ARMC): Relative Index: INVALID

## 2011-03-14 LAB — LIPASE, BLOOD: Lipase: 39

## 2011-03-14 LAB — ALCOHOL, METHYL (METHANOL), BLOOD

## 2011-03-14 LAB — ETHANOL: Alcohol, Ethyl (B): <5

## 2011-03-14 LAB — TROPONIN I: Troponin I: 0.09 — ABNORMAL HIGH

## 2011-03-14 LAB — SALICYLATE LEVEL: Salicylate Lvl: <4

## 2011-03-14 LAB — PHOSPHORUS: Phosphorus: 3.3

## 2011-03-14 LAB — URINE MICROSCOPIC-ADD ON

## 2011-03-14 LAB — D-DIMER, QUANTITATIVE: D-Dimer, Quant: 0.44

## 2011-03-14 LAB — PROTIME-INR
INR: 1.2
Prothrombin Time: 15.3 — ABNORMAL HIGH

## 2011-03-17 LAB — BASIC METABOLIC PANEL
BUN: 6
Calcium: 9.2
Chloride: 105
Creatinine, Ser: 0.89
GFR calc Af Amer: >60
GFR calc non Af Amer: >60

## 2011-03-17 LAB — CBC
Platelets: 241
RBC: 4.66
WBC: 7.8

## 2011-05-01 ENCOUNTER — Encounter: Payer: Self-pay | Admitting: Licensed Clinical Social Worker

## 2011-05-09 ENCOUNTER — Other Ambulatory Visit: Payer: Self-pay | Admitting: *Deleted

## 2011-05-09 DIAGNOSIS — E785 Hyperlipidemia, unspecified: Secondary | ICD-10-CM

## 2011-05-09 DIAGNOSIS — F329 Major depressive disorder, single episode, unspecified: Secondary | ICD-10-CM

## 2011-05-09 DIAGNOSIS — I10 Essential (primary) hypertension: Secondary | ICD-10-CM

## 2011-05-09 MED ORDER — ATORVASTATIN CALCIUM 10 MG PO TABS: 10.0000 mg | ORAL_TABLET | Freq: Every day | ORAL | Status: DC

## 2011-05-09 MED ORDER — ENALAPRIL MALEATE 20 MG PO TABS: 20.0000 mg | ORAL_TABLET | Freq: Two times a day (BID) | ORAL | Status: DC

## 2011-05-09 MED ORDER — VENLAFAXINE HCL 50 MG PO TABS: 50.0000 mg | ORAL_TABLET | Freq: Every day | ORAL | Status: DC

## 2011-05-09 MED ORDER — AMLODIPINE BESYLATE 10 MG PO TABS: 10.0000 mg | ORAL_TABLET | Freq: Every day | ORAL | Status: DC

## 2011-05-09 MED ORDER — GLIPIZIDE 10 MG PO TABS: 10.0000 mg | ORAL_TABLET | Freq: Two times a day (BID) | ORAL | Status: DC

## 2011-05-09 MED ORDER — AMITRIPTYLINE HCL 100 MG PO TABS: 100.0000 mg | ORAL_TABLET | Freq: Every day | ORAL | Status: DC

## 2011-05-09 MED ORDER — CETIRIZINE HCL 10 MG PO TABS: 10.0000 mg | ORAL_TABLET | Freq: Every day | ORAL | Status: DC

## 2011-05-14 ENCOUNTER — Other Ambulatory Visit: Payer: Self-pay | Admitting: *Deleted

## 2011-05-14 DIAGNOSIS — I509 Heart failure, unspecified: Secondary | ICD-10-CM

## 2011-05-14 DIAGNOSIS — E119 Type 2 diabetes mellitus without complications: Secondary | ICD-10-CM

## 2011-05-14 DIAGNOSIS — I10 Essential (primary) hypertension: Secondary | ICD-10-CM

## 2011-05-14 MED ORDER — GABAPENTIN 300 MG PO CAPS: 300.0000 mg | ORAL_CAPSULE | Freq: Three times a day (TID) | ORAL | Status: DC

## 2011-05-14 MED ORDER — CARVEDILOL 25 MG PO TABS: 25.0000 mg | ORAL_TABLET | Freq: Two times a day (BID) | ORAL | Status: DC

## 2011-05-14 MED ORDER — METFORMIN HCL 1000 MG PO TABS: 1000.0000 mg | ORAL_TABLET | Freq: Two times a day (BID) | ORAL | Status: AC

## 2011-05-14 MED ORDER — FUROSEMIDE 80 MG PO TABS: 80.0000 mg | ORAL_TABLET | Freq: Two times a day (BID) | ORAL | Status: DC

## 2011-05-16 ENCOUNTER — Emergency Department (HOSPITAL_COMMUNITY): Payer: Medicare HMO

## 2011-05-16 ENCOUNTER — Emergency Department (HOSPITAL_COMMUNITY)
Admission: EM | Admit: 2011-05-16 | Discharge: 2011-05-16 | Disposition: A | Discharge status: Home / Self Care | Payer: Medicare HMO | Attending: Emergency Medicine | Admitting: Emergency Medicine

## 2011-05-16 ENCOUNTER — Encounter (HOSPITAL_COMMUNITY): Payer: Self-pay | Admitting: *Deleted

## 2011-05-16 DIAGNOSIS — I10 Essential (primary) hypertension: Secondary | ICD-10-CM | POA: Insufficient documentation

## 2011-05-16 DIAGNOSIS — I739 Peripheral vascular disease, unspecified: Secondary | ICD-10-CM | POA: Insufficient documentation

## 2011-05-16 DIAGNOSIS — W010XXA Fall on same level from slipping, tripping and stumbling without subsequent striking against object, initial encounter: Secondary | ICD-10-CM | POA: Insufficient documentation

## 2011-05-16 DIAGNOSIS — F1010 Alcohol abuse, uncomplicated: Secondary | ICD-10-CM | POA: Insufficient documentation

## 2011-05-16 DIAGNOSIS — S42213A Unspecified displaced fracture of surgical neck of unspecified humerus, initial encounter for closed fracture: Secondary | ICD-10-CM | POA: Insufficient documentation

## 2011-05-16 DIAGNOSIS — F17200 Nicotine dependence, unspecified, uncomplicated: Secondary | ICD-10-CM | POA: Insufficient documentation

## 2011-05-16 DIAGNOSIS — Z79899 Other long term (current) drug therapy: Secondary | ICD-10-CM | POA: Insufficient documentation

## 2011-05-16 DIAGNOSIS — E119 Type 2 diabetes mellitus without complications: Secondary | ICD-10-CM | POA: Insufficient documentation

## 2011-05-16 DIAGNOSIS — I2510 Atherosclerotic heart disease of native coronary artery without angina pectoris: Secondary | ICD-10-CM | POA: Insufficient documentation

## 2011-05-16 DIAGNOSIS — Z8619 Personal history of other infectious and parasitic diseases: Secondary | ICD-10-CM | POA: Insufficient documentation

## 2011-05-16 DIAGNOSIS — Z9889 Other specified postprocedural states: Secondary | ICD-10-CM | POA: Insufficient documentation

## 2011-05-16 DIAGNOSIS — D638 Anemia in other chronic diseases classified elsewhere: Secondary | ICD-10-CM | POA: Insufficient documentation

## 2011-05-16 DIAGNOSIS — M25519 Pain in unspecified shoulder: Secondary | ICD-10-CM | POA: Insufficient documentation

## 2011-05-16 DIAGNOSIS — Z7982 Long term (current) use of aspirin: Secondary | ICD-10-CM | POA: Insufficient documentation

## 2011-05-16 DIAGNOSIS — S78119A Complete traumatic amputation at level between unspecified hip and knee, initial encounter: Secondary | ICD-10-CM | POA: Insufficient documentation

## 2011-05-16 DIAGNOSIS — E785 Hyperlipidemia, unspecified: Secondary | ICD-10-CM | POA: Insufficient documentation

## 2011-05-16 DIAGNOSIS — I509 Heart failure, unspecified: Secondary | ICD-10-CM | POA: Insufficient documentation

## 2011-05-16 MED ORDER — OXYCODONE-ACETAMINOPHEN 5-325 MG PO TABS: 1.0000 | ORAL_TABLET | ORAL | Status: AC | PRN

## 2011-05-16 MED ORDER — FENTANYL CITRATE 0.05 MG/ML IJ SOLN
INTRAMUSCULAR | Status: AC
  Administered 2011-05-16: 50 ug
  Filled 2011-05-16: qty 2

## 2011-05-16 MED ORDER — MORPHINE SULFATE 4 MG/ML IJ SOLN
4.0000 mg | Freq: Once | INTRAMUSCULAR | Status: AC
  Administered 2011-05-16: 4 mg via INTRAVENOUS
  Filled 2011-05-16: qty 1

## 2011-05-16 NOTE — ED Notes (Signed)
Sling on pt

## 2011-05-16 NOTE — Progress Notes (Signed)
Orthopedic Tech Progress Note Patient Details:  Jonathon Galvan 02/18/1946 [id number]  Other Ortho Devices Ortho Device Location: arm sling (L) UE Ortho Device Interventions: Application   Jennye Moccasin 05/16/2011, 7:29 PM

## 2011-05-16 NOTE — ED Notes (Signed)
Patient fell onto his right shoulder on yesterday.

## 2011-05-16 NOTE — ED Provider Notes (Signed)
History     CSN: [id number] Arrival date & time: 05/16/2011  1:49 PM   First MD Initiated Contact with Patient 05/16/11 1758      Chief Complaint  Patient presents with  . Fall  . Shoulder Pain    (Consider location/radiation/quality/duration/timing/severity/associated sxs/prior treatment) HPI Comments: Patient had a fall yesterday and landed on his left shoulder.  Patient only has one leg and uses a prosthetic and his left leg which he has an AKA.  When he turned to see someone he lost his balance and that how he felt.  There is no loss of consciousness.  Patient only has pain in his left shoulder.  He comes in today because of persistent pain.  No weakness or numbness is noted.  Patient declines any other symptoms.  No fevers, nausea or vomiting.  No chest pain or shortness of breath.  No radiation of his pain to  Patient is a 65 y.o. male presenting with fall and shoulder pain. The history is provided by the patient. No language interpreter was used.  Fall The accident occurred yesterday. The fall occurred while walking. He landed on a hard floor. There was no blood loss. The point of impact was the left shoulder. The pain is present in the left shoulder. The pain is moderate. He was ambulatory at the scene. There was no entrapment after the fall. There was no drug use involved in the accident. There was no alcohol use involved in the accident. Pertinent negatives include no fever, no abdominal pain, no nausea, no vomiting, no hematuria and no headaches.  Shoulder Pain Pertinent negatives include no chest pain, no abdominal pain, no headaches and no shortness of breath.    Past Medical History  Diagnosis Date  . CHF (congestive heart failure)     EF 30-35%, nonischemic  . CAD (coronary artery disease)   . Hyperlipidemia     hypertriglyceridemia on niaspan and lipitor.  . Asthma   . Allergy   . PVD (peripheral vascular disease)     s/p left AKA @ [facility] (MRSA septic  arthritis)  . Diabetes mellitus     dx in 2009, DKA, came unresponsive  . Tobacco abuse   . Hepatitis C   . Hypertension   . LBBB (left bundle branch block)   . Anemia of chronic disease     baseline Hgb now 13  . Thrombocytopenia     07/2007, went down to 90,000. likely 2/2 neurontin.  . Cocaine use     s/p cardiopulmonary arrest 12/25/2002 ([facility])  . Cholelithiasis     documented on CT abdomen in 07/2007    Past Surgical History  Procedure Date  . Left leg mrsa infection s/p aka   . Left shoulder cyst removal     Family History  Problem Relation Age of Onset  . Coronary artery disease Mother     died in her 50's  . Cirrhosis Father     died from complications  . Coronary artery disease Brother     died of fatal MI    History  Substance Use Topics  . Smoking status: Current Some Day Smoker -- 3 years  . Smokeless tobacco: Not on file  . Alcohol Use: No      Review of Systems  Constitutional: Negative.  Negative for fever and chills.  HENT: Negative.   Eyes: Negative.  Negative for discharge and redness.  Respiratory: Negative.  Negative for cough and shortness of breath.  Cardiovascular: Negative.  Negative for chest pain.  Gastrointestinal: Negative.  Negative for nausea, vomiting and abdominal pain.  Genitourinary: Negative.  Negative for hematuria.  Musculoskeletal: Positive for arthralgias. Negative for back pain.  Skin: Negative.  Negative for color change and rash.  Neurological: Negative for syncope and headaches.  Hematological: Negative.  Negative for adenopathy.  Psychiatric/Behavioral: Negative.  Negative for confusion.  All other systems reviewed and are negative.    Allergies  Review of patient's allergies indicates no known allergies.  Home Medications   Current Outpatient Rx  Name Route Sig Dispense Refill  . AMITRIPTYLINE HCL 100 MG PO TABS Oral Take 1 tablet (100 mg total) by mouth at bedtime. 30 tablet 5  . AMLODIPINE BESYLATE 10  MG PO TABS Oral Take 1 tablet (10 mg total) by mouth daily. 30 tablet 5  . ASPIRIN 81 MG PO TABS Oral Take 81 mg by mouth daily.      . ATORVASTATIN CALCIUM 10 MG PO TABS Oral Take 1 tablet (10 mg total) by mouth daily. 30 tablet 5  . CARVEDILOL 25 MG PO TABS Oral Take 1 tablet (25 mg total) by mouth 2 (two) times daily with a meal. 60 tablet 11  . CETIRIZINE HCL 10 MG PO TABS Oral Take 1 tablet (10 mg total) by mouth daily. 30 tablet 5  . ENALAPRIL MALEATE 20 MG PO TABS Oral Take 1 tablet (20 mg total) by mouth 2 (two) times daily. 60 tablet 5  . FUROSEMIDE 80 MG PO TABS Oral Take 1 tablet (80 mg total) by mouth 2 (two) times daily. 60 tablet 11  . GABAPENTIN 300 MG PO CAPS Oral Take 1 capsule (300 mg total) by mouth 3 (three) times daily. 90 capsule 11  . GLIPIZIDE 10 MG PO TABS Oral Take 1 tablet (10 mg total) by mouth 2 (two) times daily before a meal. 60 tablet 5  . METFORMIN HCL 1000 MG PO TABS Oral Take 1 tablet (1,000 mg total) by mouth 2 (two) times daily with a meal. 60 tablet 11  . NIACIN 500 MG PO TABS Oral Take 1 tablet (500 mg total) by mouth daily with breakfast. 30 tablet 3  . VENLAFAXINE HCL 50 MG PO TABS Oral Take 1 tablet (50 mg total) by mouth at bedtime. 30 tablet 5  . MORPHINE SULFATE ER 60 MG PO TB12 Oral Take 1 tablet (60 mg total) by mouth 2 (two) times daily. 58 tablet 0  . MORPHINE SULFATE 15 MG PO TABS Oral Take 1 tablet (15 mg total) by mouth 2 (two) times daily as needed. 60 tablet 0    BP 141/78  Pulse 66  Temp(Src) 98 F (36.7 C) (Oral)  Resp 20  SpO2 98%  Physical Exam  Constitutional: He is oriented to person, place, and time. He appears well-developed and well-nourished.  HENT:  Head: Normocephalic and atraumatic.  Eyes: Conjunctivae and EOM are normal. Pupils are equal, round, and reactive to light.  Neck: Normal range of motion. Neck supple.  Pulmonary/Chest: Effort normal.  Musculoskeletal:       Patient has tenderness palpation over the proximal  left humerus.  Palpable radial pulse left arm.  Capillary refill less than 2 seconds.  Sensation is intact in the radial, ulnar and median nerve distributions.  Patient can perform all hand motions.  No tenderness over the elbow.  Neurological: He is alert and oriented to person, place, and time.  Skin: Skin is warm and dry. No erythema.  Psychiatric:  He has a normal mood and affect. His behavior is normal. Judgment and thought content normal.    ED Course  Procedures (including critical care time)  Labs Reviewed - No data to display Dg Shoulder Left  05/16/2011  *RADIOLOGY REPORT*  Clinical Data: Injury, pain.  LEFT SHOULDER - 2+ VIEW  Comparison: None.  Findings: The patient has a mildly impacted surgical neck fracture of the left humerus which appears to extend to the greater tuberosity.  Humeral head is located.  Acromioclavicular joint is intact with degenerative disease.  Imaged left lung and ribs appear normal.  IMPRESSION: Mildly impacted surgical neck fracture left humerus.  Original Report Authenticated By: Bernadene Bell. Maricela Curet, M.D.     No diagnosis found.    MDM  Discussed patient with Dr. Carola Frost who will followup with the patient in his office next week.  Patient will go home with a sling for his humeral fracture.  He also received pain medication.  Patient has a wheelchair at home and so will be able to get around at home.  He also has someone who helps clean around his house 5 days a week.        Nat Christen, MD 05/16/11 Mikle Bosworth

## 2011-06-10 ENCOUNTER — Other Ambulatory Visit: Payer: Self-pay | Admitting: *Deleted

## 2011-06-10 DIAGNOSIS — E785 Hyperlipidemia, unspecified: Secondary | ICD-10-CM

## 2011-06-10 MED ORDER — NIACIN 500 MG PO TABS: 500.0000 mg | ORAL_TABLET | Freq: Every day | ORAL | Status: DC

## 2011-06-13 NOTE — Telephone Encounter (Signed)
Refill for Niacin was called to [facility].  Angelina Ok, RN 06/13/2011 4:25 PM.

## 2011-10-03 ENCOUNTER — Other Ambulatory Visit: Payer: Self-pay | Admitting: *Deleted

## 2011-10-03 DIAGNOSIS — F329 Major depressive disorder, single episode, unspecified: Secondary | ICD-10-CM

## 2011-10-03 DIAGNOSIS — I10 Essential (primary) hypertension: Secondary | ICD-10-CM

## 2011-10-03 DIAGNOSIS — E785 Hyperlipidemia, unspecified: Secondary | ICD-10-CM

## 2011-10-06 MED ORDER — GLIPIZIDE 10 MG PO TABS: 10.0000 mg | ORAL_TABLET | Freq: Two times a day (BID) | ORAL | Status: DC

## 2011-10-06 MED ORDER — ENALAPRIL MALEATE 20 MG PO TABS: 20.0000 mg | ORAL_TABLET | Freq: Two times a day (BID) | ORAL | Status: DC

## 2011-10-06 MED ORDER — ATORVASTATIN CALCIUM 10 MG PO TABS: 10.0000 mg | ORAL_TABLET | Freq: Every day | ORAL | Status: AC

## 2011-10-06 MED ORDER — CETIRIZINE HCL 10 MG PO TABS: 10.0000 mg | ORAL_TABLET | Freq: Every day | ORAL | Status: AC

## 2011-10-06 MED ORDER — AMLODIPINE BESYLATE 10 MG PO TABS: 10.0000 mg | ORAL_TABLET | Freq: Every day | ORAL | Status: DC

## 2011-10-06 MED ORDER — AMITRIPTYLINE HCL 100 MG PO TABS: 100.0000 mg | ORAL_TABLET | Freq: Every day | ORAL | Status: DC

## 2011-10-06 MED ORDER — VENLAFAXINE HCL 50 MG PO TABS: 50.0000 mg | ORAL_TABLET | Freq: Every day | ORAL | Status: AC

## 2012-03-22 ENCOUNTER — Telehealth: Payer: Self-pay | Admitting: *Deleted

## 2012-03-22 NOTE — Telephone Encounter (Signed)
Fax from [facility] - Cetirizine 10mg  is not covered by pt's insurance, [employer],but Zyrtec is if you want to change med. Thanks Need new rx

## 2012-03-23 NOTE — Telephone Encounter (Signed)
Patient has not been seen in the clinic in >1 year. Patient needs to be seen in the clinic prior to any refills.

## 2012-03-23 NOTE — Telephone Encounter (Signed)
[facility] made awared. 

## 2012-03-25 ENCOUNTER — Other Ambulatory Visit: Payer: Self-pay | Admitting: *Deleted

## 2012-03-25 DIAGNOSIS — F329 Major depressive disorder, single episode, unspecified: Secondary | ICD-10-CM

## 2012-03-25 DIAGNOSIS — I10 Essential (primary) hypertension: Secondary | ICD-10-CM

## 2012-03-25 DIAGNOSIS — E785 Hyperlipidemia, unspecified: Secondary | ICD-10-CM

## 2012-10-07 IMAGING — CR DG CHEST 2V
2 series · 2 of 2 positions shown · non-contrast
Comparison: Chest x-ray of 09/07/2007

CLINICAL DATA: History of congestive heart failure

CHEST - 2 VIEW

[w chest pa]
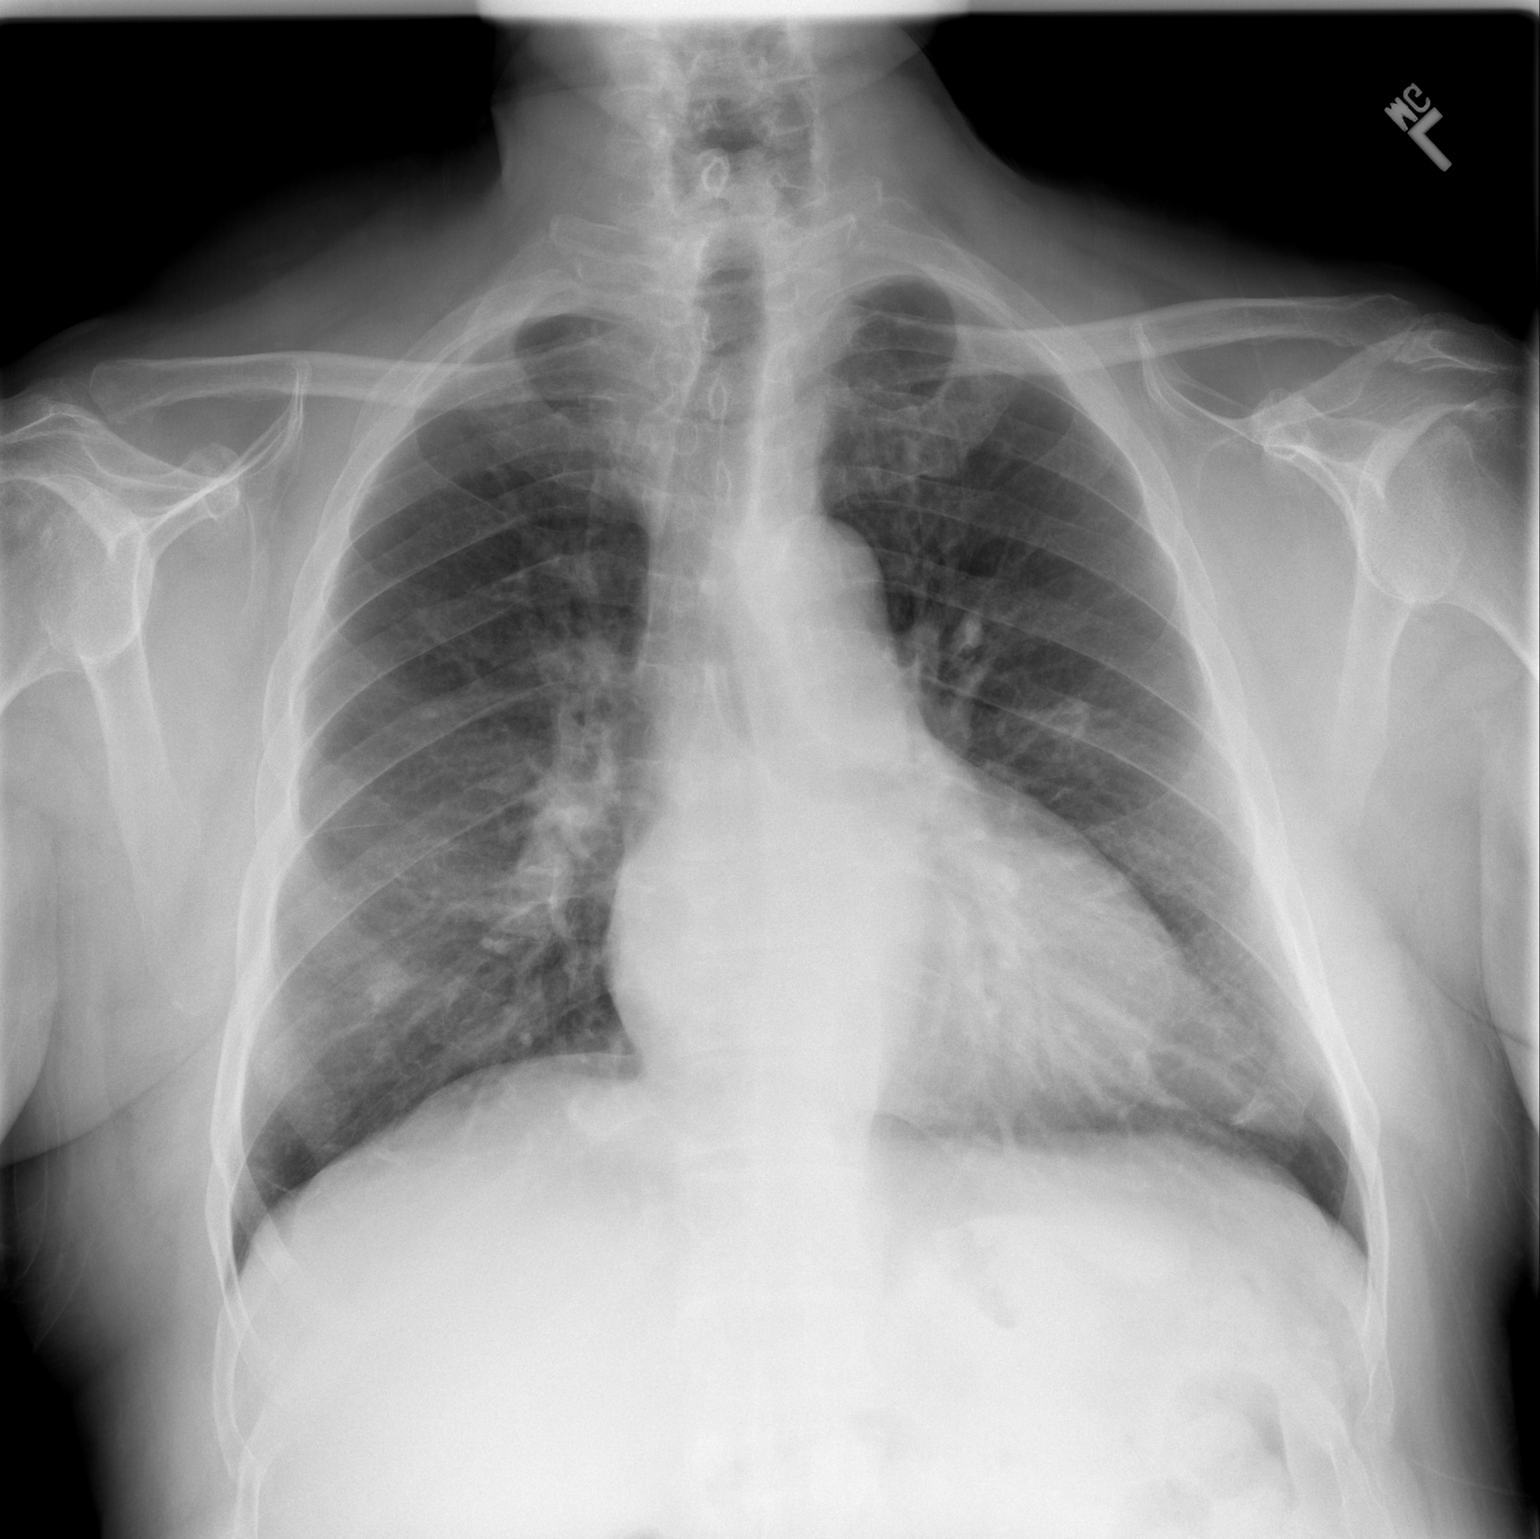

[w chest lat]
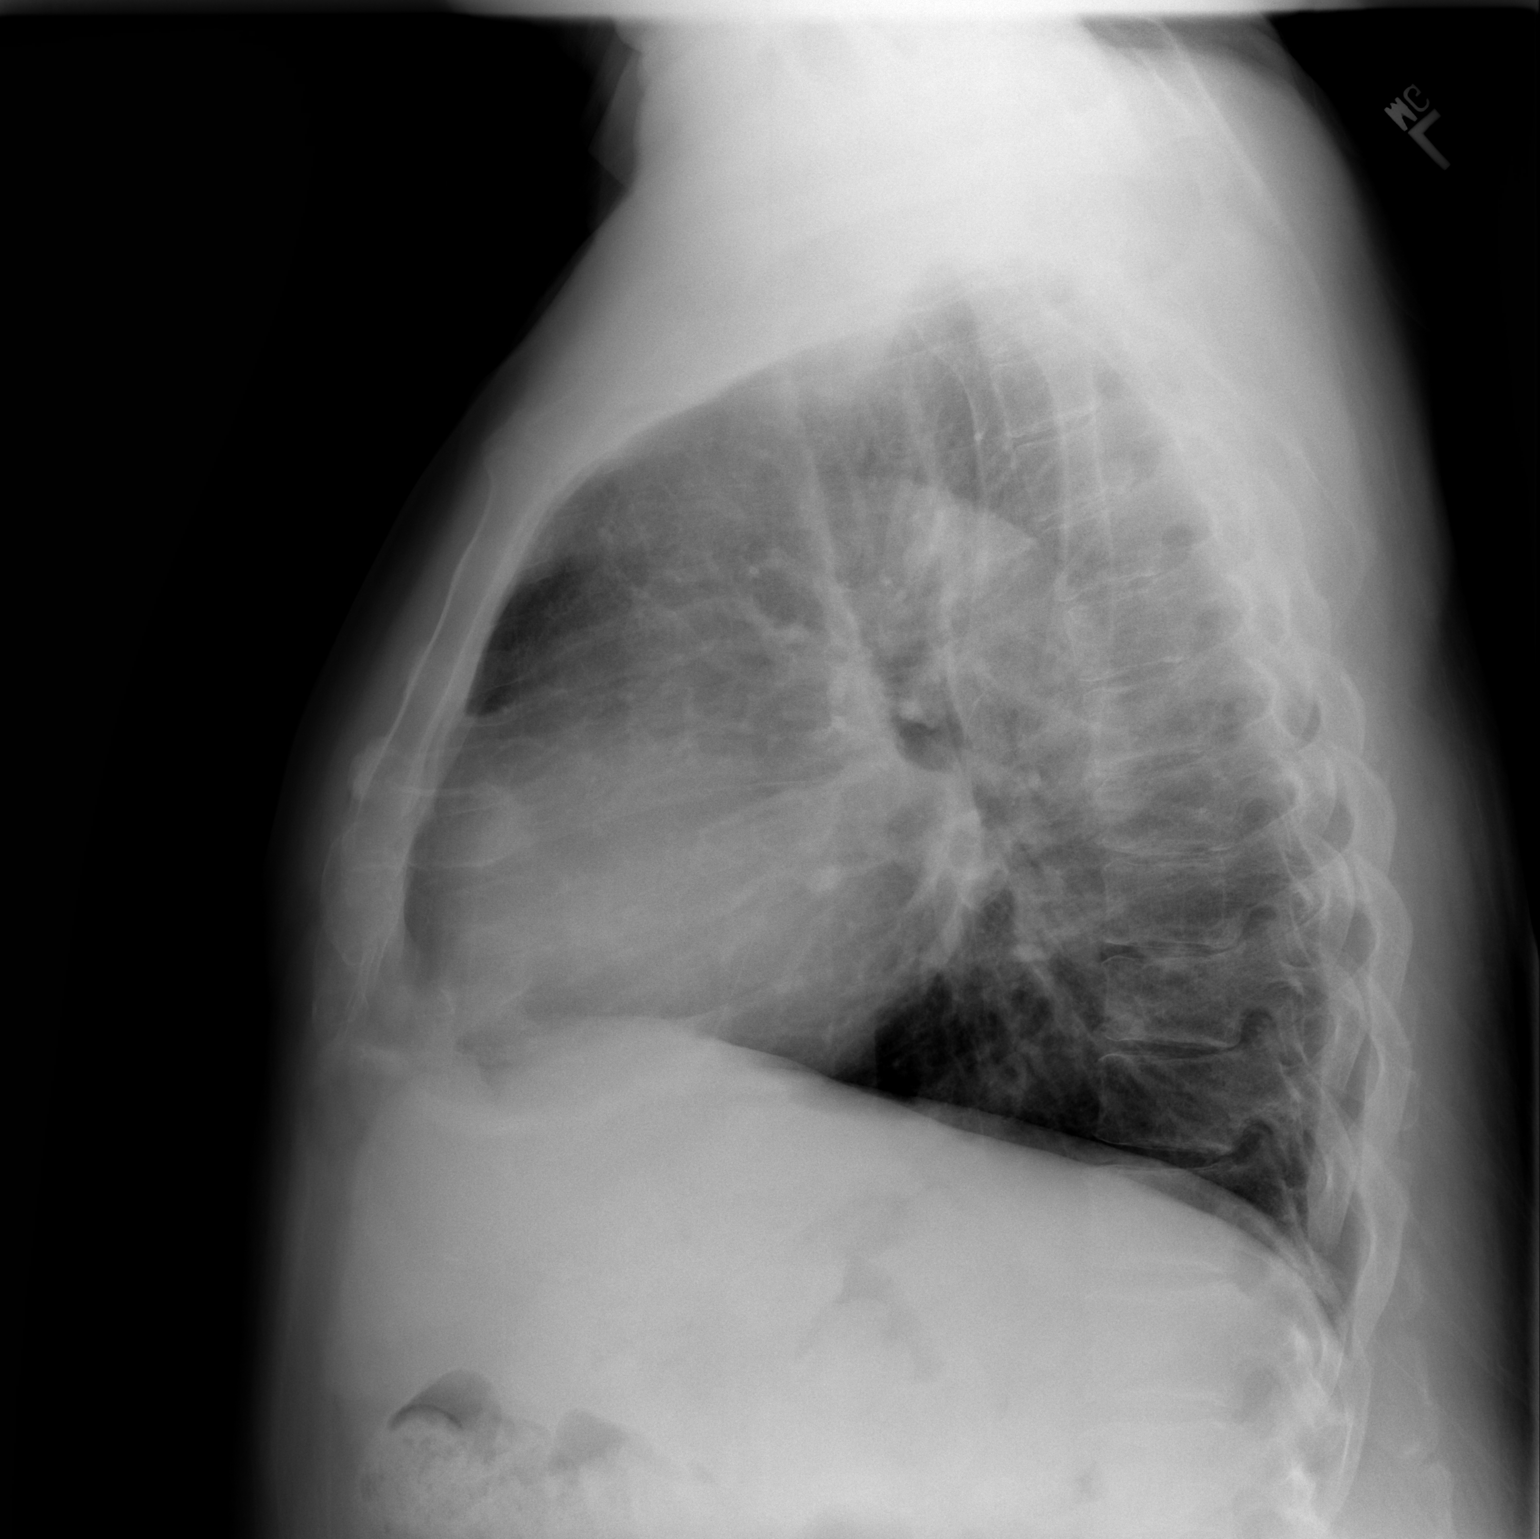

[2 of 2 positions shown; findings below may reference images not displayed]

FINDINGS: No active infiltrate or effusion is seen.  There is a
vague nodular opacity at the right lung base which may represent
nipple shadow but repeat PA chest x-ray with nipple markers is
recommended.  Mediastinal contours are stable.  The heart is mildly
enlarged.  No bony abnormality is seen.
IMPRESSION: 1.  Mild cardiomegaly.  No active lung disease.
2.  Nodular opacity at the right lung base probably represents
nipple shadow but recommend repeat PA chest x-ray with nipple
markers.

## 2012-12-30 ENCOUNTER — Other Ambulatory Visit: Payer: Self-pay

## 2013-01-31 ENCOUNTER — Encounter (HOSPITAL_COMMUNITY): Payer: Self-pay | Admitting: Emergency Medicine

## 2013-01-31 ENCOUNTER — Emergency Department (HOSPITAL_COMMUNITY)
Admission: EM | Admit: 2013-01-31 | Discharge: 2013-01-31 | Disposition: A | Discharge status: Home / Self Care | Payer: Medicare HMO | Attending: Emergency Medicine | Admitting: Emergency Medicine

## 2013-01-31 DIAGNOSIS — Z862 Personal history of diseases of the blood and blood-forming organs and certain disorders involving the immune mechanism: Secondary | ICD-10-CM | POA: Insufficient documentation

## 2013-01-31 DIAGNOSIS — R112 Nausea with vomiting, unspecified: Secondary | ICD-10-CM | POA: Insufficient documentation

## 2013-01-31 DIAGNOSIS — S78119A Complete traumatic amputation at level between unspecified hip and knee, initial encounter: Secondary | ICD-10-CM | POA: Insufficient documentation

## 2013-01-31 DIAGNOSIS — Z8719 Personal history of other diseases of the digestive system: Secondary | ICD-10-CM | POA: Insufficient documentation

## 2013-01-31 DIAGNOSIS — Z8619 Personal history of other infectious and parasitic diseases: Secondary | ICD-10-CM | POA: Insufficient documentation

## 2013-01-31 DIAGNOSIS — Z8679 Personal history of other diseases of the circulatory system: Secondary | ICD-10-CM | POA: Insufficient documentation

## 2013-01-31 DIAGNOSIS — I509 Heart failure, unspecified: Secondary | ICD-10-CM | POA: Insufficient documentation

## 2013-01-31 DIAGNOSIS — E785 Hyperlipidemia, unspecified: Secondary | ICD-10-CM | POA: Insufficient documentation

## 2013-01-31 DIAGNOSIS — Z7982 Long term (current) use of aspirin: Secondary | ICD-10-CM | POA: Insufficient documentation

## 2013-01-31 DIAGNOSIS — I10 Essential (primary) hypertension: Secondary | ICD-10-CM | POA: Insufficient documentation

## 2013-01-31 DIAGNOSIS — Z79899 Other long term (current) drug therapy: Secondary | ICD-10-CM | POA: Insufficient documentation

## 2013-01-31 DIAGNOSIS — J45909 Unspecified asthma, uncomplicated: Secondary | ICD-10-CM | POA: Insufficient documentation

## 2013-01-31 DIAGNOSIS — F17200 Nicotine dependence, unspecified, uncomplicated: Secondary | ICD-10-CM | POA: Insufficient documentation

## 2013-01-31 DIAGNOSIS — I2510 Atherosclerotic heart disease of native coronary artery without angina pectoris: Secondary | ICD-10-CM | POA: Insufficient documentation

## 2013-01-31 NOTE — ED Notes (Signed)
Patient was taking medications earlier today because of being out of his own medications. So he took several different types of medications. Woke up with nausea and vomiting about an hour after he took all the medications 3 Oxycodones - they were old 2 lyrica 1 Meloxacam.

## 2013-01-31 NOTE — ED Notes (Signed)
Patient states that now that he has thrown up that he feels much better, The patient denies any pain at this time.

## 2013-01-31 NOTE — ED Notes (Signed)
Bed:[facility]<BR> Expected date:<BR> Expected time:<BR> Means of arrival:<BR> Comments:<BR> EMS

## 2013-01-31 NOTE — ED Provider Notes (Signed)
CSN: [id number]     Arrival date & time 01/31/13  7829 History     First MD Initiated Contact with Patient 01/31/13 0520     Chief Complaint  Patient presents with  . Nausea  . 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 He has chronic pain in his leg, he usually takes morphine as well as Percocet for this pain however he is out of his medications and took some old out of date medications including OxyContin, Lyrica and an NSAID. He states that shortly thereafter he became nauseated and was feeling like he was going to vomit. After he vomited these symptoms essentially resolved and at this time he has no symptoms or complaints. He has no chest pain, shortness of breath, fevers, chills, cough, swelling, abdominal pain, diarrhea or dysuria.  Patient is a 67 y.o. male presenting with vomiting. The history is provided by the patient and a relative.  Emesis   Past Medical History  Diagnosis Date  . CHF (congestive heart failure)     EF 30-35%, nonischemic  . CAD (coronary artery disease)   . Hyperlipidemia     hypertriglyceridemia on niaspan and lipitor.  . Asthma   . Allergy   . PVD (peripheral vascular disease)     s/p left AKA @ [facility] (MRSA septic arthritis)  . Diabetes mellitus     dx in 2009, DKA, came unresponsive  . Tobacco abuse   . Hepatitis C   . Hypertension   . LBBB (left bundle branch block)   . Anemia of chronic disease     baseline Hgb now 13  . Thrombocytopenia     07/2007, went down to 90,000. likely 2/2 neurontin.  . Cocaine use     s/p cardiopulmonary arrest 12/25/2002 ([facility])  . Cholelithiasis     documented on CT abdomen in 07/2007   Past Surgical History  Procedure Laterality Date  . Left leg mrsa infection s/p aka    . Left  shoulder cyst removal     Family History  Problem Relation Age of Onset  . Coronary artery disease Mother     died in her 62's  . Cirrhosis Father     died from complications  . Coronary artery disease Brother     died of fatal MI   History  Substance Use Topics  . Smoking status: Current Some Day Smoker -- 3 years  . Smokeless tobacco: Not on file  . Alcohol Use: No    Review of Systems  Gastrointestinal: Positive for vomiting.  All other systems reviewed and are negative.    Allergies  Review of patient's allergies indicates no known allergies.  Home Medications   Current Outpatient Rx  Name  Route  Sig  Dispense  Refill  . metFORMIN (GLUCOPHAGE) 1000 MG tablet   Oral   Take 1 tablet (1,000 mg total) by mouth 2 (two) times daily with a meal.   60 tablet   11   . morphine (MS CONTIN) 60 MG 12 hr tablet   Oral   Take 60 mg by mouth 2 (two) times daily.         Marland Kitchen oxyCODONE-acetaminophen (PERCOCET) 10-325 MG per tablet   Oral   Take 1 tablet by mouth every 4 (four) hours as needed for pain.         Marland Kitchen amitriptyline (  ELAVIL) 100 MG tablet   Oral   Take 1 tablet (100 mg total) by mouth at bedtime.   30 tablet   5   . amLODipine (NORVASC) 10 MG tablet   Oral   Take 1 tablet (10 mg total) by mouth daily.   30 tablet   5   . aspirin 81 MG tablet   Oral   Take 81 mg by mouth daily.           Marland Kitchen atorvastatin (LIPITOR) 10 MG tablet   Oral   Take 1 tablet (10 mg total) by mouth daily.   30 tablet   5   . carvedilol (COREG) 25 MG tablet   Oral   Take 1 tablet (25 mg total) by mouth 2 (two) times daily with a meal.   60 tablet   11   . cetirizine (ZYRTEC) 10 MG tablet   Oral   Take 1 tablet (10 mg total) by mouth daily.   30 tablet   5   . enalapril (VASOTEC) 20 MG tablet   Oral   Take 1 tablet (20 mg total) by mouth 2 (two) times daily.   60 tablet   5   . furosemide (LASIX) 80 MG tablet   Oral   Take 1 tablet (80 mg total) by mouth 2  (two) times daily.   60 tablet   11   . niacin 500 MG tablet   Oral   Take 1 tablet (500 mg total) by mouth daily with breakfast.   30 tablet   11   . venlafaxine (EFFEXOR) 50 MG tablet   Oral   Take 1 tablet (50 mg total) by mouth at bedtime.   30 tablet   5    BP 151/79  Pulse 59  Temp(Src) 97.9 F (36.6 C)  Resp 18  SpO2 100% Physical Exam  Nursing note and vitals reviewed. Constitutional: He appears well-developed and well-nourished. No distress.  HENT:  Head: Normocephalic and atraumatic.  Mouth/Throat: Oropharynx is clear and moist. No oropharyngeal exudate.  Eyes: Conjunctivae and EOM are normal. Pupils are equal, round, and reactive to light. Right eye exhibits no discharge. Left eye exhibits no discharge. No scleral icterus.  Neck: Normal range of motion. Neck supple. No JVD present. No thyromegaly present.  Cardiovascular: Normal rate, regular rhythm, normal heart sounds and intact distal pulses.  Exam reveals no gallop and no friction rub.   No murmur heard. Pulmonary/Chest: Effort normal and breath sounds normal. No respiratory distress. He has no wheezes. He has no rales.  Abdominal: Soft. Bowel sounds are normal. He exhibits no distension and no mass. There is no tenderness.  Musculoskeletal: Normal range of motion. He exhibits no edema and no tenderness.  Left lower extremity above-the-knee amputation  Lymphadenopathy:    He has no cervical adenopathy.  Neurological: He is alert. Coordination normal.  Skin: Skin is warm and dry. No rash noted. No erythema.  Psychiatric: He has a normal mood and affect. His behavior is normal.    ED Course   Procedures (including critical care time)  Labs Reviewed - No data to display No results found. 1. Nausea and vomiting in adult     MDM  The patient is sleepy but easily arousable and has a normal neurologic exam at his baseline when he wakes up. The symptoms she had her likely related to taking his and they  told her medications which he no longer takes several of which gave him side  effects in the past including the Lyrica and OxyContin which she did not tolerate well. He is supposed to go to his doctor later today to get his medications refilled. At this time he appears hemodynamically stable, short observation period  Patient has had no more symptoms throughout his entire emergency department observational stay. He appears stable for discharge without any chest pain cough shortness of breath abdominal pain and no nausea or vomiting at this time.  Vida Roller, MD 01/31/13 [phone]

## 2013-07-12 ENCOUNTER — Ambulatory Visit: Payer: Medicare HMO | Attending: Internal Medicine | Admitting: Physical Therapy

## 2013-09-02 ENCOUNTER — Emergency Department (HOSPITAL_COMMUNITY)
Admission: EM | Admit: 2013-09-02 | Discharge: 2013-09-02 | Disposition: A | Discharge status: Home / Self Care | Payer: Medicare HMO | Attending: Emergency Medicine | Admitting: Emergency Medicine

## 2013-09-02 ENCOUNTER — Encounter (HOSPITAL_COMMUNITY): Payer: Self-pay | Admitting: Emergency Medicine

## 2013-09-02 DIAGNOSIS — Z862 Personal history of diseases of the blood and blood-forming organs and certain disorders involving the immune mechanism: Secondary | ICD-10-CM | POA: Insufficient documentation

## 2013-09-02 DIAGNOSIS — F17200 Nicotine dependence, unspecified, uncomplicated: Secondary | ICD-10-CM | POA: Insufficient documentation

## 2013-09-02 DIAGNOSIS — Z8719 Personal history of other diseases of the digestive system: Secondary | ICD-10-CM | POA: Insufficient documentation

## 2013-09-02 DIAGNOSIS — I2510 Atherosclerotic heart disease of native coronary artery without angina pectoris: Secondary | ICD-10-CM | POA: Insufficient documentation

## 2013-09-02 DIAGNOSIS — Z8659 Personal history of other mental and behavioral disorders: Secondary | ICD-10-CM | POA: Insufficient documentation

## 2013-09-02 DIAGNOSIS — Y92009 Unspecified place in unspecified non-institutional (private) residence as the place of occurrence of the external cause: Secondary | ICD-10-CM | POA: Insufficient documentation

## 2013-09-02 DIAGNOSIS — W010XXA Fall on same level from slipping, tripping and stumbling without subsequent striking against object, initial encounter: Secondary | ICD-10-CM | POA: Insufficient documentation

## 2013-09-02 DIAGNOSIS — S0180XA Unspecified open wound of other part of head, initial encounter: Secondary | ICD-10-CM | POA: Insufficient documentation

## 2013-09-02 DIAGNOSIS — S01119A Laceration without foreign body of unspecified eyelid and periocular area, initial encounter: Secondary | ICD-10-CM

## 2013-09-02 DIAGNOSIS — J45909 Unspecified asthma, uncomplicated: Secondary | ICD-10-CM | POA: Insufficient documentation

## 2013-09-02 DIAGNOSIS — Z7982 Long term (current) use of aspirin: Secondary | ICD-10-CM | POA: Insufficient documentation

## 2013-09-02 DIAGNOSIS — Y9301 Activity, walking, marching and hiking: Secondary | ICD-10-CM | POA: Insufficient documentation

## 2013-09-02 DIAGNOSIS — I10 Essential (primary) hypertension: Secondary | ICD-10-CM | POA: Insufficient documentation

## 2013-09-02 DIAGNOSIS — E785 Hyperlipidemia, unspecified: Secondary | ICD-10-CM | POA: Insufficient documentation

## 2013-09-02 DIAGNOSIS — I509 Heart failure, unspecified: Secondary | ICD-10-CM | POA: Insufficient documentation

## 2013-09-02 DIAGNOSIS — Z8619 Personal history of other infectious and parasitic diseases: Secondary | ICD-10-CM | POA: Insufficient documentation

## 2013-09-02 DIAGNOSIS — Z79899 Other long term (current) drug therapy: Secondary | ICD-10-CM | POA: Insufficient documentation

## 2013-09-02 DIAGNOSIS — E119 Type 2 diabetes mellitus without complications: Secondary | ICD-10-CM | POA: Insufficient documentation

## 2013-09-02 LAB — COMPREHENSIVE METABOLIC PANEL
ALBUMIN: 3.1 g/dL — AB (ref 3.5–5.2)
ALK PHOS: 76 U/L (ref 39–117)
ALT: 14 U/L (ref 0–53)
AST: 17 U/L (ref 0–37)
BUN: 13 mg/dL (ref 6–23)
CHLORIDE: 100 meq/L (ref 96–112)
CO2: 26 mEq/L (ref 19–32)
Calcium: 8.6 mg/dL (ref 8.4–10.5)
Creatinine, Ser: 1.53 mg/dL — ABNORMAL HIGH (ref 0.50–1.35)
GFR calc Af Amer: 52 mL/min — ABNORMAL LOW (ref >90)
GFR calc non Af Amer: 45 mL/min — ABNORMAL LOW (ref >90)
Glucose, Bld: 129 mg/dL — ABNORMAL HIGH (ref 70–99)
POTASSIUM: 3.3 meq/L — AB (ref 3.7–5.3)
Sodium: 142 mEq/L (ref 137–147)
TOTAL PROTEIN: 6.6 g/dL (ref 6.0–8.3)
Total Bilirubin: 0.3 mg/dL (ref 0.3–1.2)

## 2013-09-02 LAB — CBC
HCT: 38 % — ABNORMAL LOW (ref 39.0–52.0)
Hemoglobin: 12.7 g/dL — ABNORMAL LOW (ref 13.0–17.0)
MCH: 26.9 pg (ref 26.0–34.0)
MCHC: 33.4 g/dL (ref 30.0–36.0)
MCV: 80.5 fL (ref 78.0–100.0)
Platelets: 245 10*3/uL (ref 150–400)
RBC: 4.72 MIL/uL (ref 4.22–5.81)
RDW: 14.2 % (ref 11.5–15.5)
WBC: 11.7 10*3/uL — ABNORMAL HIGH (ref 4.0–10.5)

## 2013-09-02 LAB — I-STAT TROPONIN, ED: TROPONIN I, POC: 0.01 ng/mL (ref 0.00–0.08)

## 2013-09-02 MED ORDER — BACITRACIN ZINC 500 UNIT/GM EX OINT: 1.0000 "application " | TOPICAL_OINTMENT | Freq: Two times a day (BID) | CUTANEOUS | Status: DC

## 2013-09-02 NOTE — ED Notes (Signed)
Pt provided coke and graham crackers also for wife.

## 2013-09-02 NOTE — ED Notes (Signed)
Pt's sister reports the pt has fallen at least 4 times in the past six months.

## 2013-09-02 NOTE — ED Provider Notes (Signed)
CSN: [id number]     Arrival date & time 09/02/13  0256 History   None    Chief Complaint  Patient presents with  . Fall  . Head Laceration     (Consider location/radiation/quality/duration/timing/severity/associated sxs/prior Treatment) HPI This patient is a 68 yo man who presents with complaints of laceration to the right eyebrow following a mechanical fall. He tripped over something while walking in his house. He denies LOC. He denies pain or injury to any other region. Td is utd. No pain at this time.   Patient has multiple chronic medical problems including atrial fibrillation but, he is not anticoagulated.   Past Medical History  Diagnosis Date  . CHF (congestive heart failure)     EF 30-35%, nonischemic  . CAD (coronary artery disease)   . Hyperlipidemia     hypertriglyceridemia on niaspan and lipitor.  . Asthma   . Allergy   . PVD (peripheral vascular disease)     s/p left AKA @ [facility] (MRSA septic arthritis)  . Diabetes mellitus     dx in 2009, DKA, came unresponsive  . Tobacco abuse   . Hepatitis C   . Hypertension   . LBBB (left bundle branch block)   . Anemia of chronic disease     baseline Hgb now 13  . Thrombocytopenia     07/2007, went down to 90,000. likely 2/2 neurontin.  . Cocaine use     s/p cardiopulmonary arrest 12/25/2002 ([facility])  . Cholelithiasis     documented on CT abdomen in 07/2007   Past Surgical History  Procedure Laterality Date  . Left leg mrsa infection s/p aka    . Left shoulder cyst removal     Family History  Problem Relation Age of Onset  . Coronary artery disease Mother     died in her 4660's  . Cirrhosis Father     died from complications  . Coronary artery disease Brother     died of fatal MI   History  Substance Use Topics  . Smoking status: Current Some Day Smoker -- 3 years  . Smokeless tobacco: Not on file  . Alcohol Use: No    Review of Systems Ten point review of symptoms performed and is negative with the  exception of symptoms noted above.     Allergies  Review of patient's allergies indicates no known allergies.  Home Medications   Current Outpatient Rx  Name  Route  Sig  Dispense  Refill  . amitriptyline (ELAVIL) 100 MG tablet   Oral   Take 1 tablet (100 mg total) by mouth at bedtime.   30 tablet   5   . amLODipine (NORVASC) 10 MG tablet   Oral   Take 1 tablet (10 mg total) by mouth daily.   30 tablet   5   . aspirin 81 MG tablet   Oral   Take 81 mg by mouth daily.           Marland Kitchen. atorvastatin (LIPITOR) 10 MG tablet   Oral   Take 1 tablet (10 mg total) by mouth daily.   30 tablet   5   . carvedilol (COREG) 25 MG tablet   Oral   Take 1 tablet (25 mg total) by mouth 2 (two) times daily with a meal.   60 tablet   11   . cetirizine (ZYRTEC) 10 MG tablet   Oral   Take 1 tablet (10 mg total) by mouth daily.  30 tablet   5   . enalapril (VASOTEC) 20 MG tablet   Oral   Take 1 tablet (20 mg total) by mouth 2 (two) times daily.   60 tablet   5   . furosemide (LASIX) 80 MG tablet   Oral   Take 1 tablet (80 mg total) by mouth 2 (two) times daily.   60 tablet   11   . metFORMIN (GLUCOPHAGE) 1000 MG tablet   Oral   Take 1 tablet (1,000 mg total) by mouth 2 (two) times daily with a meal.   60 tablet   11   . morphine (MS CONTIN) 60 MG 12 hr tablet   Oral   Take 60 mg by mouth 2 (two) times daily.         . niacin 500 MG tablet   Oral   Take 1 tablet (500 mg total) by mouth daily with breakfast.   30 tablet   11   . oxyCODONE-acetaminophen (PERCOCET) 10-325 MG per tablet   Oral   Take 1 tablet by mouth every 4 (four) hours as needed for pain.         Marland Kitchen venlafaxine (EFFEXOR) 50 MG tablet   Oral   Take 1 tablet (50 mg total) by mouth at bedtime.   30 tablet   5    BP 100/81  Pulse 58  Temp(Src) 98.6 F (37 C) (Oral)  Resp 16  Ht 5\' 10"  (1.778 m)  Wt 213 lb (96.616 kg)  BMI 30.56 kg/m2  SpO2 96% Physical Exam Gen: sleeping but easily  arousable. well developed and well nourished appearing Head: NCAT FACE: 3CM jagged laceration right eyebrow, no active bleeding Eyes: PERL, EOMI Nose: no epistaixis or rhinorrhea Mouth/throat: mucosa is moist and pink, no intra-oral trauma Neck: c spine nontender Lungs: CTA B, no wheezing, rhonchi or rales CV: RRR, no murmur, extremities appear well perfused.  Abd: soft, notender, nondistended Back: no ttp, no cva ttp Skin: warm and dry Ext: s/p left BKA, otherwise normal to inspection, no dependent edema Neuro: CN ii-xii grossly intact, no focal deficits, motor strength 5/5 both arms and legs Psyche; normal affect,  calm and cooperative.   ED Course  Procedures (including critical care time) Labs Review   MDM   Patient with mechanical fall and laceration to the right eyebrow. Please see MLP repair note.    Jonathon Loosen, MD 09/02/13 [phone]

## 2013-09-02 NOTE — ED Provider Notes (Signed)
LACERATION REPAIR Performed by: Junious Silk Authorized by: Junious Silk Consent: Verbal consent obtained. Risks and benefits: risks, benefits and alternatives were discussed Consent given by: patient Patient identity confirmed: provided demographic data Prepped and Draped in normal sterile fashion Wound explored  Laceration Location: right face  Laceration Length: 3 cm, jagged  No Foreign Bodies seen or palpated  Anesthesia: local infiltration  Local anesthetic: lidocaine 2 %   Anesthetic total: 3 ml  Irrigation method: syringe Amount of cleaning: standard  Skin closure: 5-0 Prolene  Number of sutures: 5  Technique: simple interrupted   Patient tolerance: Patient tolerated the procedure well with no immediate complications.   Mora Bellman, PA-C 09/02/13 [phone]

## 2013-09-02 NOTE — Discharge Instructions (Signed)
Facial Laceration °A facial laceration is a cut on the face. These injuries can be painful and cause bleeding. Some cuts may need to be closed with stitches (sutures), skin adhesive strips, or wound glue. Cuts usually heal quickly but can leave a scar. It can take 1 2 years for the scar to go away completely. °HOME CARE  °· Only take medicines as told by your doctor. °· Follow your doctor's instructions for wound care. °For Stitches: °· Keep the cut clean and dry. °· If you have a bandage (dressing), change it at least once a day. Change the bandage if it gets wet or dirty, or as told by your doctor. °· Wash the cut with soap and water 2 times a day. Rinse the cut with water. Pat it dry with a clean towel. °· Put a thin layer of medicated cream on the cut as told by your doctor. °· You may shower after the first 24 hours. Do not soak the cut in water until the stitches are removed. °· Have your stitches removed as told by your doctor. °· Do not wear any makeup until a few days after your stitches are removed. °For Skin Adhesive Strips: °· Keep the cut clean and dry. °· Do not get the strips wet. You may take a bath, but be careful to keep the cut dry. °· If the cut gets wet, pat it dry with a clean towel. °· The strips will fall off on their own. Do not remove the strips that are still stuck to the cut. °For Wound Glue: °· You may shower or take baths. Do not soak or scrub the cut. Do not swim. Avoid heavy sweating until the glue falls off on its own. After a shower or bath, pat the cut dry with a clean towel. °· Do not put medicine or makeup on your cut until the glue falls off. °· If you have a bandage, do not put tape over the glue. °· Avoid lots of sunlight or tanning lamps until the glue falls off. °· The glue will fall off on its own in 5 10 days. Do not pick at the glue. °After Healing: °Put sunscreen on the cut for the first year to reduce your scar. °GET HELP RIGHT AWAY IF:  °· Your cut area gets red,  painful, or puffy (swollen). °· You see a yellowish-white fluid (pus) coming from the cut. °· You have chills or a fever. °MAKE SURE YOU:  °· Understand these instructions. °· Will watch your condition. °· Will get help right away if you are not doing well or get worse. °Document Released: 11/26/2007 Document Revised: 03/30/2013 Document Reviewed: 01/20/2013 °[employer]® Patient Information ©2014 [employer]. ° °

## 2013-09-02 NOTE — ED Notes (Signed)
Pt states he was in bed and he fell. Pt states that he hit his head on the nightstand. Pt denies passing out or LOC. Pt states he is currently dizzy and has a HA. Pt sister changed patient's dressing to his right eyebrow twice since his fall due to bleeding. Pt denies blood thinners.

## 2013-09-03 NOTE — ED Provider Notes (Signed)
I provided direct supervision of PA Merrel with this laceration repair.   Brandt Loosen, MD 09/03/13 [phone]

## 2013-09-13 ENCOUNTER — Emergency Department (HOSPITAL_COMMUNITY)
Admission: EM | Admit: 2013-09-13 | Discharge: 2013-09-13 | Disposition: A | Discharge status: Home / Self Care | Payer: Medicare HMO | Attending: Emergency Medicine | Admitting: Emergency Medicine

## 2013-09-13 ENCOUNTER — Encounter (HOSPITAL_COMMUNITY): Payer: Self-pay | Admitting: Emergency Medicine

## 2013-09-13 DIAGNOSIS — Z79899 Other long term (current) drug therapy: Secondary | ICD-10-CM | POA: Insufficient documentation

## 2013-09-13 DIAGNOSIS — Z4802 Encounter for removal of sutures: Secondary | ICD-10-CM | POA: Insufficient documentation

## 2013-09-13 DIAGNOSIS — E785 Hyperlipidemia, unspecified: Secondary | ICD-10-CM | POA: Insufficient documentation

## 2013-09-13 DIAGNOSIS — I10 Essential (primary) hypertension: Secondary | ICD-10-CM | POA: Insufficient documentation

## 2013-09-13 DIAGNOSIS — F17200 Nicotine dependence, unspecified, uncomplicated: Secondary | ICD-10-CM | POA: Insufficient documentation

## 2013-09-13 DIAGNOSIS — Z8619 Personal history of other infectious and parasitic diseases: Secondary | ICD-10-CM | POA: Insufficient documentation

## 2013-09-13 DIAGNOSIS — Z7982 Long term (current) use of aspirin: Secondary | ICD-10-CM | POA: Insufficient documentation

## 2013-09-13 DIAGNOSIS — Z8719 Personal history of other diseases of the digestive system: Secondary | ICD-10-CM | POA: Insufficient documentation

## 2013-09-13 DIAGNOSIS — I2510 Atherosclerotic heart disease of native coronary artery without angina pectoris: Secondary | ICD-10-CM | POA: Insufficient documentation

## 2013-09-13 DIAGNOSIS — I509 Heart failure, unspecified: Secondary | ICD-10-CM | POA: Insufficient documentation

## 2013-09-13 DIAGNOSIS — J45909 Unspecified asthma, uncomplicated: Secondary | ICD-10-CM | POA: Insufficient documentation

## 2013-09-13 DIAGNOSIS — Z792 Long term (current) use of antibiotics: Secondary | ICD-10-CM | POA: Insufficient documentation

## 2013-09-13 DIAGNOSIS — Z862 Personal history of diseases of the blood and blood-forming organs and certain disorders involving the immune mechanism: Secondary | ICD-10-CM | POA: Insufficient documentation

## 2013-09-13 DIAGNOSIS — E1110 Type 2 diabetes mellitus with ketoacidosis without coma: Secondary | ICD-10-CM | POA: Insufficient documentation

## 2013-09-13 NOTE — Discharge Instructions (Signed)
Return to the emergency department if you develop any changing/worsening condition, drainage, spreading redness/swelling, fever or any other concerns (please read additional information regarding your condition below)   Suture Removal, Care After Refer to this sheet in the next few weeks. These instructions provide you with information on caring for yourself after your procedure. Your health care provider may also give you more specific instructions. Your treatment has been planned according to current medical practices, but problems sometimes occur. Call your health care provider if you have any problems or questions after your procedure. WHAT TO EXPECT AFTER THE PROCEDURE After your stitches (sutures) are removed, it is typical to have the following:  Some discomfort and swelling in the wound area.  Slight redness in the area. HOME CARE INSTRUCTIONS   If you have skin adhesive strips over the wound area, do not take the strips off. They will fall off on their own in a few days. If the strips remain in place after 14 days, you may remove them.  Change any bandages (dressings) at least once a day or as directed by your health care provider. If the bandage sticks, soak it off with warm, soapy water.  Apply cream or ointment only as directed by your health care provider. If using cream or ointment, wash the area with soap and water 2 times a day to remove all the cream or ointment. Rinse off the soap and pat the area dry with a clean towel.  Keep the wound area dry and clean. If the bandage becomes wet or dirty, or if it develops a bad smell, change it as soon as possible.  Continue to protect the wound from injury.  Use sunscreen when out in the sun. New scars become sunburned easily. SEEK MEDICAL CARE IF:  You have increasing redness, swelling, or pain in the wound.  You see pus coming from the wound.  You have a fever.  You notice a bad smell coming from the wound or  dressing.  Your wound breaks open (edges not staying together). Document Released: 03/04/2001 Document Revised: 03/30/2013 Document Reviewed: 01/19/2013 [facility] Patient Information 2014 [place], [place].   Emergency Department Resource Guide 1) Find a Doctor and Pay Out of Pocket Although you won't have to find out who is covered by your insurance plan, it is a good idea to ask around and get recommendations. You will then need to call the office and see if the doctor you have chosen will accept you as a new patient and what types of options they offer for patients who are self-pay. Some doctors offer discounts or will set up payment plans for their patients who do not have insurance, but you will need to ask so you aren't surprised when you get to your appointment.  2) Contact Your Local Health Department Not all health departments have doctors that can see patients for sick visits, but many do, so it is worth a call to see if yours does. If you don't know where your local health department is, you can check in your phone book. The CDC also has a tool to help you locate your state's health department, and many state websites also have listings of all of their local health departments.  3) Find a Walk-in Clinic If your illness is not likely to be very severe or complicated, you may want to try a walk in clinic. These are popping up all over the country in pharmacies, drugstores, and shopping centers. They're usually staffed by nurse  practitioners or physician assistants that have been trained to treat common illnesses and complaints. They're usually fairly quick and inexpensive. However, if you have serious medical issues or chronic medical problems, these are probably not your best option.  No Primary Care Doctor: - Call Health Connect at  [phone] - they can help you locate a primary care doctor that  accepts your insurance, provides certain services, etc. - Physician Referral Service-  [phone]  Chronic Pain Problems: Organization         Address  Phone   Notes  [facility]  [phone] Patients need to be referred by their primary care doctor.   Medication Assistance: Organization         Address  Phone   Notes  [facility] [address] [place], [place] [address] [phone] --Must be a resident of [place] -- Must have NO insurance coverage whatsoever (no Medicaid/ Medicare, etc.) -- The pt. MUST have a primary care doctor that directs their care regularly and follows them in the community   [facility]ist  [phone]   [employer]  [phone]    Agencies that provide inexpensive medical care: Organization         Address  Phone   Notes  [facility]  [phone]   [facility]    [phone]) [phone]   [facility] [address] [place], [place] [address] [phone]   [facility] [address], [place] [phone]   Planned Parenthood    [phone]   [facility]    [phone]   [facility]  [address], [place] Phone:  [phone], Fax:  [phone] Hours of Operation:  9 am - 6 pm, M-F.  Also accepts Medicaid/Medicare and self-pay.  [facility]  [address], [place] Phone: [phone], Fax: [phone]. Hours of Operation:  8:30 am - 5:30 pm, M-F.  Also accepts Medicaid and self-pay.  [facility] [place] [address], [place] Phone: [phone]   [facility] [address], [place], [place] [phone], Ext. [phone] Mondays & Thursdays: 7-9 AM.  First 15 patients are seen on a first come, first serve basis.    Medicaid-accepting [facility] Providers:  Organization         Address  Phone   Notes  [facility] [address],  [place] [phone] Also accepts self-pay patients.  [facility] [address], [place]  [phone]   [facility] [address], [place] [phone]   [facility] [address] [place] [address], [place] [phone]   Renaye RakersVeita Bland [address], [place]   [phone] Only accepts [place] Access [place] patients after they have their name applied to their card.   Self-Pay (no insurance) in [facility]:  Organization         Address  Phone   Notes  Sickle Cell Patients, [facility] [address], [place] [phone]   [facility] [address], [place] [phone]   [facility] [place]  [address], [place] [phone]) [phone]   [facility]/Dr. Julio Sickssei-Bonsu  [address], [place] or [address], [place] [phone] Phone number for both [place] and [place] locations is the same.  [facility] [address], [place] [phone]   [facility] [address] [place] or [address] [phone]   [facility] [address] [place] [phone], phone; [phone], fax Sees patients 1st and 3rd Saturday of every month.  Must not qualify for public or private insurance (i.e. Medicaid, Medicare, [place] Health Choice, Veterans' Benefits)  Household income should be no more than 200% of the poverty level The clinic cannot treat you if you are pregnant or think you are pregnant  Sexually transmitted diseases are not treated at the clinic.    Dental Care: Organization         Address  Phone  Notes  [facility] [address] [place], [place] [phone] Accepts children up to age 89 who are enrolled in  [place] or [place] Health Choice; pregnant women with a Medicaid card; and children who have applied for Medicaid or [place] Health Choice, but were declined, whose parents can pay a reduced fee at time of service.  [facility]  [address], [place] [phone] Accepts children up to age 57 who are enrolled in [place] or [place] Health Choice; pregnant women with a Medicaid card; and children who have applied for Medicaid or  Health Choice, but were declined, whose parents can pay a reduced fee at time of service.  [facility]  [address] [place], [place] [phone] Patients are seen by appointment only. Walk-ins are not accepted. [facility] will see patients 26 years of age and older. Monday - Tuesday (8am-5pm) Most Wednesdays (8:30-5pm) $30 per visit, cash only  [facility]  [address], [facility] [phone] Patients are seen by appointment only. Walk-ins are not accepted. [facility] will see patients 87 years of age and older. One Wednesday Evening (Monthly: Volunteer Based).  $30 per visit, cash only  [employer]  [phone] for adults; Children under age 51, call Graduate Pediatric Dentistry at [phone]. Children aged 48-14, please call [phone] to request a pediatric application.  Dental services are provided in all areas of dental care including fillings, crowns and bridges, complete and partial dentures, implants, gum treatment, root canals, and extractions. Preventive care is also provided. Treatment is provided to both adults and children. Patients are selected via a lottery and there is often a waiting list.   [facility] [address] [place]  [phone] [email or web address]   [facility] [address] [place], [place] [phone], Ext. [phone] Second and Fourth Thursday of each month, opens at 6:30  AM; Clinic ends at 9 AM.  Patients are seen on a first-come first-served basis, and a limited number are seen during each clinic.   [facility]  [address] [place], [place] [phone]   Eligibility Requirements You must have lived in [place], [place], or [place] counties for at least the last three months.   You cannot be eligible for state or federal sponsored [place], including [employer], [place], or [employer].   You generally cannot be eligible for healthcare insurance through your employer.  How to apply: Eligibility screenings are held every Tuesday and Wednesday afternoon from 1:00 pm until 4:00 pm. You do not need an appointment for the interview!  [facility] [address] [place], [place] [phone]   [facility] Health Department  [phone]   [facility] Health Department  [phone]   [facility] Health Department  [phone]    Behavioral Health Resources in the Community: Intensive Outpatient Programs Organization         Address  Phone  Notes  [facility] [address] [place], [place] [phone]   [facility] [address] [place], [place] [phone]   [facility] [address] [place], [place]  [phone]   [facility] [address]  [place], [place] [phone] or [phone]   Substance Abuse Resources Organization         Address  Phone  Notes  [facility] Drug Services  [phone]   [facility]  [phone]   [facility] [place]  [phone]   Floydene Flock  [phone]   Residential & Outpatient Substance Abuse Program  [phone]   Psychological Services Organization         Address  Phone  Notes  [facility] Health  [phone]   [facility]  [phone]   [facility] Mental Health [address], [place] [phone] or  [phone]    Mobile Crisis Teams Organization         Address  Phone  Notes  [facility], Mobile Crisis Care Unit  [phone]   [facility]  [address] [place], [place] [phone]   Doristine Locks [address] [place] [phone]    Self-Help/Support Groups Organization         Address  Phone             Notes  [facility] [place] - variety of support groups  [phone]- [phone] Call for more information  Narcotics Anonymous (NA), [facility] [address], [employer] [place]  2 meetings at this location   Statistician         Address  Phone  Notes  [facility] [address],    [place]  [phone]   [facility]  [address], [place] [address], [place], [place] [phone]   [facility] [address] [place], [place] [phone] Admissions: 8am-3pm M-F  [facility] [address]    [place], [place] [phone]   [facility] [address] [place], [place], [place] [phone]   [facility] [address]  [place], [place] [phone]   [facility] - Intensive Outpatient [address], [place], [place] [phone]   [facility] ([facility] [address] [place].,  [place], [place] [phone] or [phone]   [facility] (RTS) [address], [place], [place] [phone] Accepts Medicaid  [facility] [address],  [place] [phone] Substance Abuse/Addiction Treatment   [facility] Organization         Address  Phone  Notes  [facility]  [phone]   Angie Fava, PhD [address] Kn[address] [place], [place]   [phone] or [phone]   [facility]al   [address] [place], [place] [phone]   [facility] [address],  [place], [place] [phone] Insurance/Medicaid/sponsorship through [facility] and Families [address], [place], [place] [phone] [place] [address]   [place], [place] [phone]    Dr. Adele Schilder  [phone]   [facility]. 1) [address], [place] 2) [place] 3)  [address], [place] [phone]  [phone]   [place] [phone] or [phone] (After Hours)

## 2013-09-13 NOTE — ED Provider Notes (Signed)
CSN: [id number]     Arrival date & time 09/13/13  1322 History   First MD Initiated Contact with Patient 09/13/13 1338     No chief complaint on file.   HPI  Jonathon Galvan is a 68 y.o. male with a PMH of CAD, CHF, HLD, asthma, allergy, PVD, DM, hepatitis C, tobacco abuse, HTN, LBBB, anemia, thrombocytopenia, cocaine use, and cholelithiasis who presents to the ED for evaluation of suture removal. History was provided by the patient. Patient seen on 09/02/13 for a mechanical fall and a laceration repair to the right face. Patient states area has been healing well. No drainage, eye swelling/redness, eye pain, photophobia, vision changes. Patient has been taking Ibuprofen and Tylenol for intermittent eyebrow pain. No other concerns. Patient has follow-up with PCP on 09/20/13.    Past Medical History  Diagnosis Date  . CHF (congestive heart failure)     EF 30-35%, nonischemic  . CAD (coronary artery disease)   . Hyperlipidemia     hypertriglyceridemia on niaspan and lipitor.  . Asthma   . Allergy   . PVD (peripheral vascular disease)     s/p [facility] (MRSA septic arthritis)  . Diabetes mellitus     dx in 2009, DKA, came unresponsive  . Tobacco abuse   . Hepatitis C   . Hypertension   . LBBB (left bundle branch block)   . Anemia of chronic disease     baseline Hgb now 13  . Thrombocytopenia     07/2007, went down to 90,000. likely 2/2 neurontin.  . Cocaine use     s/p cardiopulmonary arrest 12/25/2002 [facility])  . Cholelithiasis     documented on CT abdomen in 07/2007   Past Surgical History  Procedure Laterality Date  . Left leg mrsa infection s/p aka    . Left shoulder cyst removal     Family History  Problem Relation Age of Onset  . Coronary artery disease Mother     died in her 50's  . Cirrhosis Father     died from complications  . Coronary artery disease Brother     died of fatal MI   History  Substance Use Topics  . Smoking status: Current Some Day  Smoker -- 3 years  . Smokeless tobacco: Not on file  . Alcohol Use: No    Review of Systems  Constitutional: Negative for fever, chills, diaphoresis, activity change, appetite change and fatigue.  Eyes: Negative for photophobia, pain and visual disturbance.  Gastrointestinal: Negative for nausea, vomiting and abdominal pain.  Skin: Positive for wound. Negative for color change.    Allergies  Review of patient's allergies indicates no known allergies.  Home Medications   Current Outpatient Rx  Name  Route  Sig  Dispense  Refill  . amitriptyline (ELAVIL) 100 MG tablet   Oral   Take 1 tablet (100 mg total) by mouth at bedtime.   30 tablet   5   . amLODipine (NORVASC) 10 MG tablet   Oral   Take 1 tablet (10 mg total) by mouth daily.   30 tablet   5   . aspirin 81 MG tablet   Oral   Take 81 mg by mouth daily.           Marland Kitchen atorvastatin (LIPITOR) 10 MG tablet   Oral   Take 1 tablet (10 mg total) by mouth daily.   30 tablet   5   . bacitracin ointment  Topical   Apply 1 application topically 2 (two) times daily.   120 g   0   . carvedilol (COREG) 25 MG tablet   Oral   Take 1 tablet (25 mg total) by mouth 2 (two) times daily with a meal.   60 tablet   11   . cetirizine (ZYRTEC) 10 MG tablet   Oral   Take 1 tablet (10 mg total) by mouth daily.   30 tablet   5   . enalapril (VASOTEC) 20 MG tablet   Oral   Take 1 tablet (20 mg total) by mouth 2 (two) times daily.   60 tablet   5   . furosemide (LASIX) 80 MG tablet   Oral   Take 1 tablet (80 mg total) by mouth 2 (two) times daily.   60 tablet   11   . gabapentin (NEURONTIN) 300 MG capsule   Oral   Take 1 capsule by mouth 3 (three) times daily.         Marland Kitchen. glipiZIDE (GLUCOTROL) 10 MG tablet   Oral   Take 1 tablet by mouth 2 (two) times daily.         . metFORMIN (GLUCOPHAGE) 1000 MG tablet   Oral   Take 1 tablet (1,000 mg total) by mouth 2 (two) times daily with a meal.   60 tablet   11   .  morphine (MS CONTIN) 60 MG 12 hr tablet   Oral   Take 60 mg by mouth 2 (two) times daily.         . niacin (NIASPAN) 500 MG CR tablet   Oral   Take 1 tablet by mouth daily.         . niacin 500 MG tablet   Oral   Take 1 tablet (500 mg total) by mouth daily with breakfast.   30 tablet   11   . oxyCODONE-acetaminophen (PERCOCET) 10-325 MG per tablet   Oral   Take 1 tablet by mouth every 4 (four) hours as needed for pain.         Marland Kitchen. venlafaxine (EFFEXOR) 50 MG tablet   Oral   Take 1 tablet (50 mg total) by mouth at bedtime.   30 tablet   5   . zolpidem (AMBIEN) 10 MG tablet   Oral   Take 1 tablet by mouth at bedtime as needed for sleep.           BP 122/68  Pulse 58  Temp(Src) 98.6 F (37 C)  Resp 17  Wt 213 lb (96.616 kg)  SpO2 96%  Filed Vitals:   09/13/13 1331  BP: 122/68  Pulse: 58  Temp: 98.6 F (37 C)  Resp: 17  Weight: 213 lb (96.616 kg)  SpO2: 96%    Physical Exam  Nursing note and vitals reviewed. Constitutional: He is oriented to person, place, and time. He appears well-developed and well-nourished. No distress.  HENT:  Head: Normocephalic and atraumatic.    Right Ear: External ear normal.  Left Ear: External ear normal.  Nose: Nose normal.  Mouth/Throat: Oropharynx is clear and moist. No oropharyngeal exudate.  Well-appearing jagged laceration to the right eyebrow with sutures in place. No underlying edema, ecchymosis, erythema, open wound. No drainage.   Eyes: Conjunctivae and EOM are normal. Pupils are equal, round, and reactive to light. Right eye exhibits no discharge. Left eye exhibits no discharge.  No periorbital edema or erythema  Neck: Normal range of motion. Neck supple.  Cardiovascular: Normal rate.   Pulmonary/Chest: Effort normal.  Neurological: He is alert and oriented to person, place, and time.  Skin: Skin is warm and dry. He is not diaphoretic.    ED Course  SUTURE REMOVAL Date/Time: 09/13/2013 1:45 PM Performed  by: Coral Ceo K Authorized by: Jillyn Ledger Consent: Verbal consent obtained. Consent given by: patient Patient identity confirmed: verbally with patient Body area: head/neck Location details: right eyebrow Wound Appearance: clean Sutures Removed: 5 Staples Removed: 0 Post-removal: dressing applied and antibiotic ointment applied Facility: sutures placed in this facility Patient tolerance: Patient tolerated the procedure well with no immediate complications.   (including critical care time) Labs Review Labs Reviewed - No data to display Imaging Review No results found.   EKG Interpretation None      MDM   Jonathon Galvan is a 68 y.o. male with a PMH of CAD, CHF, HLD, asthma, allergy, PVD, DM, hepatitis C, tobacco abuse, HTN, LBBB, anemia, thrombocytopenia, cocaine use, and cholelithiasis who presents to the ED for evaluation of suture removal. Well appearing laceration with no evidence of drainage, edema, erythema or infectious process/cellulitis. Sutures removed. Patient has follow-up appointment with PCP on 09/20/13. Encouraged patient to continue wound care. Return precautions, discharge instructions, and follow-up was discussed with the patient before discharge.     Discharge Medication List as of 09/13/2013  1:59 PM       Final impressions: 1. Encounter for removal of sutures       Greer Ee Endiya Klahr PA-C          Jillyn Ledger, PA-C 09/13/13 2140

## 2013-09-14 NOTE — ED Provider Notes (Signed)
Medical screening examination/treatment/procedure(s) were performed by non-physician practitioner and as supervising physician I was immediately available for consultation/collaboration.   EKG Interpretation None       Donnetta Hutching, MD 09/14/13 1234

## 2014-03-01 ENCOUNTER — Other Ambulatory Visit: Payer: Self-pay | Admitting: *Deleted

## 2014-03-01 NOTE — Telephone Encounter (Signed)
[facility]

## 2014-04-23 DIAGNOSIS — I428 Other cardiomyopathies: Secondary | ICD-10-CM

## 2014-04-23 HISTORY — DX: Other cardiomyopathies: I42.8

## 2014-05-04 ENCOUNTER — Encounter (HOSPITAL_COMMUNITY)
Admission: EM | Disposition: A | Discharge status: Skilled Nursing Facility | Payer: Self-pay | Source: Home / Self Care | Attending: Pulmonary Disease

## 2014-05-04 ENCOUNTER — Inpatient Hospital Stay (HOSPITAL_COMMUNITY): Payer: PRIVATE HEALTH INSURANCE

## 2014-05-04 ENCOUNTER — Emergency Department (HOSPITAL_COMMUNITY): Payer: PRIVATE HEALTH INSURANCE

## 2014-05-04 ENCOUNTER — Inpatient Hospital Stay (HOSPITAL_COMMUNITY)
Admission: EM | Admit: 2014-05-04 | Discharge: 2014-05-21 | DRG: 224 | Disposition: A | Discharge status: Skilled Nursing Facility | Payer: PRIVATE HEALTH INSURANCE | Attending: Internal Medicine | Admitting: Internal Medicine

## 2014-05-04 DIAGNOSIS — J9601 Acute respiratory failure with hypoxia: Secondary | ICD-10-CM | POA: Diagnosis present

## 2014-05-04 DIAGNOSIS — R570 Cardiogenic shock: Secondary | ICD-10-CM | POA: Diagnosis present

## 2014-05-04 DIAGNOSIS — Z89619 Acquired absence of unspecified leg above knee: Secondary | ICD-10-CM

## 2014-05-04 DIAGNOSIS — Z8249 Family history of ischemic heart disease and other diseases of the circulatory system: Secondary | ICD-10-CM

## 2014-05-04 DIAGNOSIS — Z9911 Dependence on respirator [ventilator] status: Secondary | ICD-10-CM

## 2014-05-04 DIAGNOSIS — N170 Acute kidney failure with tubular necrosis: Secondary | ICD-10-CM | POA: Diagnosis present

## 2014-05-04 DIAGNOSIS — D638 Anemia in other chronic diseases classified elsewhere: Secondary | ICD-10-CM | POA: Diagnosis present

## 2014-05-04 DIAGNOSIS — Z7982 Long term (current) use of aspirin: Secondary | ICD-10-CM

## 2014-05-04 DIAGNOSIS — Z720 Tobacco use: Secondary | ICD-10-CM

## 2014-05-04 DIAGNOSIS — N183 Chronic kidney disease, stage 3 (moderate): Secondary | ICD-10-CM | POA: Diagnosis present

## 2014-05-04 DIAGNOSIS — I469 Cardiac arrest, cause unspecified: Secondary | ICD-10-CM | POA: Diagnosis present

## 2014-05-04 DIAGNOSIS — N179 Acute kidney failure, unspecified: Secondary | ICD-10-CM | POA: Diagnosis present

## 2014-05-04 DIAGNOSIS — R5081 Fever presenting with conditions classified elsewhere: Secondary | ICD-10-CM | POA: Diagnosis present

## 2014-05-04 DIAGNOSIS — G931 Anoxic brain damage, not elsewhere classified: Secondary | ICD-10-CM | POA: Diagnosis not present

## 2014-05-04 DIAGNOSIS — E785 Hyperlipidemia, unspecified: Secondary | ICD-10-CM | POA: Diagnosis present

## 2014-05-04 DIAGNOSIS — F17210 Nicotine dependence, cigarettes, uncomplicated: Secondary | ICD-10-CM | POA: Diagnosis present

## 2014-05-04 DIAGNOSIS — I2510 Atherosclerotic heart disease of native coronary artery without angina pectoris: Secondary | ICD-10-CM | POA: Diagnosis present

## 2014-05-04 DIAGNOSIS — I739 Peripheral vascular disease, unspecified: Secondary | ICD-10-CM | POA: Diagnosis present

## 2014-05-04 DIAGNOSIS — I429 Cardiomyopathy, unspecified: Secondary | ICD-10-CM | POA: Diagnosis present

## 2014-05-04 DIAGNOSIS — G8929 Other chronic pain: Secondary | ICD-10-CM | POA: Diagnosis present

## 2014-05-04 DIAGNOSIS — E876 Hypokalemia: Secondary | ICD-10-CM | POA: Diagnosis present

## 2014-05-04 DIAGNOSIS — Z89612 Acquired absence of left leg above knee: Secondary | ICD-10-CM

## 2014-05-04 DIAGNOSIS — R0789 Other chest pain: Secondary | ICD-10-CM | POA: Diagnosis present

## 2014-05-04 DIAGNOSIS — R451 Restlessness and agitation: Secondary | ICD-10-CM | POA: Diagnosis not present

## 2014-05-04 DIAGNOSIS — Z993 Dependence on wheelchair: Secondary | ICD-10-CM

## 2014-05-04 DIAGNOSIS — I129 Hypertensive chronic kidney disease with stage 1 through stage 4 chronic kidney disease, or unspecified chronic kidney disease: Secondary | ICD-10-CM | POA: Diagnosis present

## 2014-05-04 DIAGNOSIS — E119 Type 2 diabetes mellitus without complications: Secondary | ICD-10-CM

## 2014-05-04 DIAGNOSIS — G936 Cerebral edema: Secondary | ICD-10-CM | POA: Diagnosis not present

## 2014-05-04 DIAGNOSIS — S0083XA Contusion of other part of head, initial encounter: Secondary | ICD-10-CM | POA: Diagnosis present

## 2014-05-04 DIAGNOSIS — J690 Pneumonitis due to inhalation of food and vomit: Secondary | ICD-10-CM | POA: Diagnosis present

## 2014-05-04 DIAGNOSIS — T1490XA Injury, unspecified, initial encounter: Secondary | ICD-10-CM

## 2014-05-04 DIAGNOSIS — J9600 Acute respiratory failure, unspecified whether with hypoxia or hypercapnia: Secondary | ICD-10-CM

## 2014-05-04 DIAGNOSIS — Z9289 Personal history of other medical treatment: Secondary | ICD-10-CM

## 2014-05-04 DIAGNOSIS — R410 Disorientation, unspecified: Secondary | ICD-10-CM | POA: Diagnosis not present

## 2014-05-04 DIAGNOSIS — Z4659 Encounter for fitting and adjustment of other gastrointestinal appliance and device: Secondary | ICD-10-CM

## 2014-05-04 DIAGNOSIS — G9340 Encephalopathy, unspecified: Secondary | ICD-10-CM

## 2014-05-04 DIAGNOSIS — I5042 Chronic combined systolic (congestive) and diastolic (congestive) heart failure: Secondary | ICD-10-CM | POA: Diagnosis present

## 2014-05-04 DIAGNOSIS — I959 Hypotension, unspecified: Secondary | ICD-10-CM | POA: Diagnosis present

## 2014-05-04 DIAGNOSIS — J189 Pneumonia, unspecified organism: Secondary | ICD-10-CM | POA: Diagnosis present

## 2014-05-04 DIAGNOSIS — J15212 Pneumonia due to Methicillin resistant Staphylococcus aureus: Secondary | ICD-10-CM | POA: Diagnosis present

## 2014-05-04 DIAGNOSIS — S061X9A Traumatic cerebral edema with loss of consciousness of unspecified duration, initial encounter: Secondary | ICD-10-CM | POA: Diagnosis present

## 2014-05-04 DIAGNOSIS — R092 Respiratory arrest: Secondary | ICD-10-CM | POA: Diagnosis present

## 2014-05-04 DIAGNOSIS — I447 Left bundle-branch block, unspecified: Secondary | ICD-10-CM | POA: Diagnosis present

## 2014-05-04 DIAGNOSIS — W19XXXA Unspecified fall, initial encounter: Secondary | ICD-10-CM | POA: Diagnosis present

## 2014-05-04 DIAGNOSIS — R001 Bradycardia, unspecified: Secondary | ICD-10-CM | POA: Diagnosis present

## 2014-05-04 DIAGNOSIS — E872 Acidosis: Secondary | ICD-10-CM | POA: Diagnosis present

## 2014-05-04 DIAGNOSIS — I4901 Ventricular fibrillation: Secondary | ICD-10-CM | POA: Diagnosis not present

## 2014-05-04 DIAGNOSIS — N189 Chronic kidney disease, unspecified: Secondary | ICD-10-CM | POA: Diagnosis not present

## 2014-05-04 DIAGNOSIS — Z8674 Personal history of sudden cardiac arrest: Secondary | ICD-10-CM | POA: Diagnosis not present

## 2014-05-04 DIAGNOSIS — F329 Major depressive disorder, single episode, unspecified: Secondary | ICD-10-CM

## 2014-05-04 DIAGNOSIS — E870 Hyperosmolality and hypernatremia: Secondary | ICD-10-CM | POA: Diagnosis present

## 2014-05-04 DIAGNOSIS — B182 Chronic viral hepatitis C: Secondary | ICD-10-CM | POA: Diagnosis present

## 2014-05-04 DIAGNOSIS — J9602 Acute respiratory failure with hypercapnia: Secondary | ICD-10-CM

## 2014-05-04 DIAGNOSIS — F32A Depression, unspecified: Secondary | ICD-10-CM

## 2014-05-04 DIAGNOSIS — I10 Essential (primary) hypertension: Secondary | ICD-10-CM | POA: Diagnosis present

## 2014-05-04 DIAGNOSIS — E46 Unspecified protein-calorie malnutrition: Secondary | ICD-10-CM | POA: Diagnosis present

## 2014-05-04 DIAGNOSIS — J9690 Respiratory failure, unspecified, unspecified whether with hypoxia or hypercapnia: Secondary | ICD-10-CM

## 2014-05-04 DIAGNOSIS — Z95810 Presence of automatic (implantable) cardiac defibrillator: Secondary | ICD-10-CM | POA: Diagnosis present

## 2014-05-04 DIAGNOSIS — Z452 Encounter for adjustment and management of vascular access device: Secondary | ICD-10-CM

## 2014-05-04 DIAGNOSIS — I428 Other cardiomyopathies: Secondary | ICD-10-CM

## 2014-05-04 HISTORY — DX: Other cardiomyopathies: I42.8

## 2014-05-04 HISTORY — PX: LEFT HEART CATHETERIZATION WITH CORONARY ANGIOGRAM: SHX5451

## 2014-05-04 LAB — BASIC METABOLIC PANEL
Anion gap: 15 (ref 5–15)
Anion gap: 14 (ref 5–15)
BUN: 14 mg/dL (ref 6–23)
BUN: 15 mg/dL (ref 6–23)
CHLORIDE: 103 meq/L (ref 96–112)
CO2: 22 mEq/L (ref 19–32)
CO2: 23 mEq/L (ref 19–32)
Calcium: 9.5 mg/dL (ref 8.4–10.5)
Calcium: 9.2 mg/dL (ref 8.4–10.5)
Chloride: 107 mEq/L (ref 96–112)
Creatinine, Ser: 1.34 mg/dL (ref 0.50–1.35)
Creatinine, Ser: 1.25 mg/dL (ref 0.50–1.35)
GFR calc Af Amer: 60 mL/min — ABNORMAL LOW (ref >90)
GFR calc Af Amer: 66 mL/min — ABNORMAL LOW (ref >90)
GFR calc non Af Amer: 52 mL/min — ABNORMAL LOW (ref >90)
GFR calc non Af Amer: 57 mL/min — ABNORMAL LOW (ref >90)
GLUCOSE: 203 mg/dL — AB (ref 70–99)
Glucose, Bld: 185 mg/dL — ABNORMAL HIGH (ref 70–99)
POTASSIUM: 3.4 meq/L — AB (ref 3.7–5.3)
POTASSIUM: 3.6 meq/L — AB (ref 3.7–5.3)
Sodium: 140 mEq/L (ref 137–147)
Sodium: 144 mEq/L (ref 137–147)

## 2014-05-04 LAB — CBC WITH DIFFERENTIAL/PLATELET
Basophils Absolute: 0 10*3/uL (ref 0.0–0.1)
Basophils Relative: 0 % (ref 0–1)
EOS ABS: 0.6 10*3/uL (ref 0.0–0.7)
EOS PCT: 3 % (ref 0–5)
HCT: 42.7 % (ref 39.0–52.0)
Hemoglobin: 14 g/dL (ref 13.0–17.0)
LYMPHS ABS: 7.6 10*3/uL — AB (ref 0.7–4.0)
Lymphocytes Relative: 37 % (ref 12–46)
MCH: 27.6 pg (ref 26.0–34.0)
MCHC: 32.8 g/dL (ref 30.0–36.0)
MCV: 84.2 fL (ref 78.0–100.0)
MONO ABS: 0.8 10*3/uL (ref 0.1–1.0)
Monocytes Relative: 4 % (ref 3–12)
Neutro Abs: 11.6 10*3/uL — ABNORMAL HIGH (ref 1.7–7.7)
Neutrophils Relative %: 56 % (ref 43–77)
PLATELETS: 232 10*3/uL (ref 150–400)
RBC: 5.07 MIL/uL (ref 4.22–5.81)
RDW: 14.7 % (ref 11.5–15.5)
WBC: 20.6 10*3/uL — ABNORMAL HIGH (ref 4.0–10.5)

## 2014-05-04 LAB — I-STAT CHEM 8, ED
BUN: 13 mg/dL (ref 6–23)
CHLORIDE: 106 meq/L (ref 96–112)
CREATININE: 1.5 mg/dL — AB (ref 0.50–1.35)
Calcium, Ion: 1.13 mmol/L (ref 1.13–1.30)
GLUCOSE: 117 mg/dL — AB (ref 70–99)
HCT: 46 % (ref 39.0–52.0)
Hemoglobin: 15.6 g/dL (ref 13.0–17.0)
Potassium: 3.8 mEq/L (ref 3.7–5.3)
Sodium: 142 mEq/L (ref 137–147)
TCO2: 23 mmol/L (ref 0–100)

## 2014-05-04 LAB — COMPREHENSIVE METABOLIC PANEL
ALK PHOS: 84 U/L (ref 39–117)
ALT: 27 U/L (ref 0–53)
AST: 42 U/L — ABNORMAL HIGH (ref 0–37)
Albumin: 3.1 g/dL — ABNORMAL LOW (ref 3.5–5.2)
Anion gap: 22 — ABNORMAL HIGH (ref 5–15)
BILIRUBIN TOTAL: 0.6 mg/dL (ref 0.3–1.2)
BUN: 12 mg/dL (ref 6–23)
CHLORIDE: 103 meq/L (ref 96–112)
CO2: 20 meq/L (ref 19–32)
Calcium: 9.1 mg/dL (ref 8.4–10.5)
Creatinine, Ser: 1.49 mg/dL — ABNORMAL HIGH (ref 0.50–1.35)
GFR, EST AFRICAN AMERICAN: 53 mL/min — AB (ref >90)
GFR, EST NON AFRICAN AMERICAN: 46 mL/min — AB (ref >90)
GLUCOSE: 114 mg/dL — AB (ref 70–99)
POTASSIUM: 4 meq/L (ref 3.7–5.3)
SODIUM: 145 meq/L (ref 137–147)
TOTAL PROTEIN: 6.8 g/dL (ref 6.0–8.3)

## 2014-05-04 LAB — GLUCOSE, CAPILLARY
GLUCOSE-CAPILLARY: 187 mg/dL — AB (ref 70–99)
Glucose-Capillary: 170 mg/dL — ABNORMAL HIGH (ref 70–99)
Glucose-Capillary: 190 mg/dL — ABNORMAL HIGH (ref 70–99)

## 2014-05-04 LAB — PROTIME-INR
INR: 1.23 (ref 0.00–1.49)
Prothrombin Time: 15.6 seconds — ABNORMAL HIGH (ref 11.6–15.2)

## 2014-05-04 LAB — TYPE AND SCREEN
ABO/RH(D): A POS
ANTIBODY SCREEN: NEGATIVE

## 2014-05-04 LAB — POCT I-STAT 3, ART BLOOD GAS (G3+)
ACID-BASE DEFICIT: 2 mmol/L (ref 0.0–2.0)
BICARBONATE: 24.1 meq/L — AB (ref 20.0–24.0)
O2 Saturation: 98 %
PH ART: 7.377 (ref 7.350–7.450)
TCO2: 25 mmol/L (ref 0–100)
pCO2 arterial: 39.4 mmHg (ref 35.0–45.0)
pO2, Arterial: 89 mmHg (ref 80.0–100.0)

## 2014-05-04 LAB — I-STAT TROPONIN, ED: Troponin i, poc: 0.04 ng/mL (ref 0.00–0.08)

## 2014-05-04 LAB — ABO/RH: ABO/RH(D): A POS

## 2014-05-04 LAB — I-STAT ARTERIAL BLOOD GAS, ED
Acid-base deficit: 3 mmol/L — ABNORMAL HIGH (ref 0.0–2.0)
Bicarbonate: 25 mEq/L — ABNORMAL HIGH (ref 20.0–24.0)
O2 SAT: 98 %
PCO2 ART: 55.4 mmHg — AB (ref 35.0–45.0)
PH ART: 7.263 — AB (ref 7.350–7.450)
PO2 ART: 118 mmHg — AB (ref 80.0–100.0)
TCO2: 27 mmol/L (ref 0–100)

## 2014-05-04 LAB — I-STAT CG4 LACTIC ACID, ED: Lactic Acid, Venous: 7.64 mmol/L — ABNORMAL HIGH (ref 0.5–2.2)

## 2014-05-04 LAB — MAGNESIUM: Magnesium: 1.8 mg/dL (ref 1.5–2.5)

## 2014-05-04 LAB — CBG MONITORING, ED: Glucose-Capillary: 159 mg/dL — ABNORMAL HIGH (ref 70–99)

## 2014-05-04 LAB — ETHANOL: Alcohol, Ethyl (B): <11 mg/dL (ref 0–11)

## 2014-05-04 LAB — MRSA PCR SCREENING: MRSA by PCR: NEGATIVE

## 2014-05-04 SURGERY — LEFT HEART CATHETERIZATION WITH CORONARY ANGIOGRAM
Anesthesia: LOCAL

## 2014-05-04 MED ORDER — SODIUM CHLORIDE 0.9 % IV SOLN
1.0000 ug/kg/min | INTRAVENOUS | Status: DC
  Administered 2014-05-04: 1 ug/kg/min via INTRAVENOUS
  Filled 2014-05-04: qty 20

## 2014-05-04 MED ORDER — CALCIUM CHLORIDE 10 % IV SOLN
INTRAVENOUS | Status: AC | PRN
  Administered 2014-05-04 (×2): 1 g via INTRAVENOUS

## 2014-05-04 MED ORDER — EPINEPHRINE HCL 0.1 MG/ML IJ SOSY
PREFILLED_SYRINGE | INTRAMUSCULAR | Status: DC | PRN
  Administered 2014-05-04: 0.5 mg via INTRAVENOUS

## 2014-05-04 MED ORDER — CISATRACURIUM BOLUS VIA INFUSION
5.0000 mg | Freq: Once | INTRAVENOUS | Status: AC
  Administered 2014-05-04: 5 mg via INTRAVENOUS
  Filled 2014-05-04: qty 5

## 2014-05-04 MED ORDER — FENTANYL BOLUS VIA INFUSION
50.0000 ug | INTRAVENOUS | Status: DC | PRN
  Filled 2014-05-04: qty 50

## 2014-05-04 MED ORDER — LIDOCAINE HCL (PF) 1 % IJ SOLN
INTRAMUSCULAR | Status: AC
  Filled 2014-05-04: qty 30

## 2014-05-04 MED ORDER — DOPAMINE-DEXTROSE 3.2-5 MG/ML-% IV SOLN
0.0000 ug/kg/min | INTRAVENOUS | Status: DC
  Administered 2014-05-05 (×2): 15 ug/kg/min via INTRAVENOUS
  Administered 2014-05-06: 13 ug/kg/min via INTRAVENOUS
  Filled 2014-05-04 (×3): qty 250

## 2014-05-04 MED ORDER — INSULIN ASPART 100 UNIT/ML ~~LOC~~ SOLN
0.0000 [IU] | SUBCUTANEOUS | Status: DC
Start: 2014-05-04 — End: 2014-05-05
  Administered 2014-05-04: 3 [IU] via SUBCUTANEOUS

## 2014-05-04 MED ORDER — INSULIN ASPART 100 UNIT/ML ~~LOC~~ SOLN: 0.0000 [IU] | SUBCUTANEOUS | Status: DC

## 2014-05-04 MED ORDER — ETOMIDATE 2 MG/ML IV SOLN
INTRAVENOUS | Status: AC | PRN
  Administered 2014-05-04: 30 mg via INTRAVENOUS

## 2014-05-04 MED ORDER — ARTIFICIAL TEARS OP OINT
1.0000 "application " | TOPICAL_OINTMENT | Freq: Three times a day (TID) | OPHTHALMIC | Status: DC
  Administered 2014-05-04 – 2014-05-06 (×6): 1 via OPHTHALMIC
  Filled 2014-05-04: qty 3.5

## 2014-05-04 MED ORDER — SODIUM CHLORIDE 0.9 % IV SOLN
250.0000 mL | INTRAVENOUS | Status: DC | PRN
  Administered 2014-05-06 – 2014-05-07 (×2): 20 mL/h via INTRAVENOUS
  Administered 2014-05-08: 10 mL/h via INTRAVENOUS
  Administered 2014-05-10: 09:00:00 via INTRAVENOUS
  Administered 2014-05-15: 250 mL via INTRAVENOUS

## 2014-05-04 MED ORDER — ROCURONIUM BROMIDE 50 MG/5ML IV SOLN
INTRAVENOUS | Status: AC | PRN
  Administered 2014-05-04: 100 mg via INTRAVENOUS

## 2014-05-04 MED ORDER — SODIUM CHLORIDE 0.9 % IV SOLN
1.0000 ug/kg/min | INTRAVENOUS | Status: DC
  Filled 2014-05-04: qty 20

## 2014-05-04 MED ORDER — DOPAMINE-DEXTROSE 3.2-5 MG/ML-% IV SOLN
0.0000 ug/kg/min | Freq: Once | INTRAVENOUS | Status: AC
  Administered 2014-05-04: 5 ug/kg/min via INTRAVENOUS

## 2014-05-04 MED ORDER — MAGNESIUM SULFATE IN D5W 10-5 MG/ML-% IV SOLN
1.0000 g | Freq: Once | INTRAVENOUS | Status: AC
  Administered 2014-05-04: 1 g via INTRAVENOUS
  Filled 2014-05-04: qty 100

## 2014-05-04 MED ORDER — HEPARIN (PORCINE) IN NACL 2-0.9 UNIT/ML-% IJ SOLN
INTRAMUSCULAR | Status: AC
  Filled 2014-05-04: qty 1000

## 2014-05-04 MED ORDER — SODIUM CHLORIDE 0.9 % IV SOLN
1.0000 mg/h | INTRAVENOUS | Status: DC
  Administered 2014-05-04: 4 mg/h via INTRAVENOUS
  Administered 2014-05-05: 5 mg/h via INTRAVENOUS
  Administered 2014-05-05: 4 mg/h via INTRAVENOUS
  Administered 2014-05-06: 5 mg/h via INTRAVENOUS
  Filled 2014-05-04 (×4): qty 10

## 2014-05-04 MED ORDER — FENTANYL CITRATE 0.05 MG/ML IJ SOLN: 100.0000 ug | Freq: Once | INTRAMUSCULAR | Status: AC | PRN

## 2014-05-04 MED ORDER — SODIUM CHLORIDE 0.9 % IV SOLN
25.0000 ug/h | INTRAVENOUS | Status: DC
  Administered 2014-05-04: 50 ug/h via INTRAVENOUS
  Administered 2014-05-05 – 2014-05-06 (×2): 175 ug/h via INTRAVENOUS
  Filled 2014-05-04 (×3): qty 50

## 2014-05-04 MED ORDER — PROPOFOL 10 MG/ML IV EMUL: 5.0000 ug/kg/min | INTRAVENOUS | Status: DC

## 2014-05-04 MED ORDER — SODIUM CHLORIDE 0.9 % IV SOLN
2000.0000 mL | Freq: Once | INTRAVENOUS | Status: AC
  Administered 2014-05-04: 2000 mL via INTRAVENOUS

## 2014-05-04 MED ORDER — NOREPINEPHRINE BITARTRATE 1 MG/ML IV SOLN
0.0000 ug/min | INTRAVENOUS | Status: DC
  Administered 2014-05-04: 3 ug/min via INTRAVENOUS
  Administered 2014-05-05: 5 ug/min via INTRAVENOUS
  Filled 2014-05-04 (×2): qty 4

## 2014-05-04 MED ORDER — CETYLPYRIDINIUM CHLORIDE 0.05 % MT LIQD
7.0000 mL | Freq: Four times a day (QID) | OROMUCOSAL | Status: DC
  Administered 2014-05-05 – 2014-05-14 (×40): 7 mL via OROMUCOSAL

## 2014-05-04 MED ORDER — CHLORHEXIDINE GLUCONATE 0.12 % MT SOLN
15.0000 mL | Freq: Two times a day (BID) | OROMUCOSAL | Status: DC
  Administered 2014-05-04 – 2014-05-14 (×20): 15 mL via OROMUCOSAL
  Filled 2014-05-04 (×21): qty 15

## 2014-05-04 MED ORDER — ASPIRIN 300 MG RE SUPP
300.0000 mg | Freq: Once | RECTAL | Status: AC
  Administered 2014-05-04: 300 mg via RECTAL
  Filled 2014-05-04 (×2): qty 1

## 2014-05-04 MED ORDER — PANTOPRAZOLE SODIUM 40 MG IV SOLR
40.0000 mg | Freq: Every day | INTRAVENOUS | Status: DC
  Administered 2014-05-04 – 2014-05-08 (×5): 40 mg via INTRAVENOUS
  Filled 2014-05-04 (×6): qty 40

## 2014-05-04 MED ORDER — SODIUM CHLORIDE 0.9 % IV SOLN
INTRAVENOUS | Status: DC
  Administered 2014-05-04: 100 mL/h via INTRAVENOUS

## 2014-05-04 MED FILL — Medication: Qty: 1 | Status: AC

## 2014-05-04 NOTE — Procedures (Signed)
Arterial Catheter Insertion Procedure Note Jonathon Galvan [id number] Oct 09, 1945  Procedure: Insertion of Arterial Catheter  Indications: Blood pressure monitoring and Frequent blood sampling  Procedure Details Consent: Unable to obtain consent because of emergent medical necessity. Time Out: Verified patient identification, verified procedure, site/side was marked, verified correct patient position, special equipment/implants available, medications/allergies/relevent history reviewed, required imaging and test results available.  Performed  Maximum sterile technique was used including antiseptics, cap, gloves, gown, hand hygiene, mask and sheet. Skin prep: Chlorhexidine; local anesthetic administered 20 gauge catheter was inserted into right radial artery using the Seldinger technique.  Evaluation Blood flow good; BP tracing good. Complications: No apparent complications.   Jonathon Galvan 05/04/2014

## 2014-05-04 NOTE — Code Documentation (Signed)
Dr. Cooper at bedside at this time 

## 2014-05-04 NOTE — ED Notes (Signed)
1 black cell phone, 1 set of keys, 1 watch and all clothing given to pt's sister.

## 2014-05-04 NOTE — ED Notes (Signed)
Received call from Radiologist that subclavian was not in proper place.  Critical Care MD made aware.  Pt now in CT will go to cath lab when CT complete

## 2014-05-04 NOTE — ED Notes (Signed)
A-Line placed in right wrist.

## 2014-05-04 NOTE — Code Documentation (Signed)
Pt maintaining pulses at 57.  Pt remains on vent.  No spontaneous breathing at this time

## 2014-05-04 NOTE — Code Documentation (Signed)
Pulses present at this time.  Pt continues to be bagged.

## 2014-05-04 NOTE — Code Documentation (Signed)
Pt continues to be bagged.  Attempting IV at this time

## 2014-05-04 NOTE — Code Documentation (Signed)
King airway removed and intubated with 7.5 endo tube

## 2014-05-04 NOTE — ED Notes (Signed)
First set of blood cultures drawn.

## 2014-05-04 NOTE — Consult Note (Signed)
CARDIOLOGY CONSULT NOTE  Patient ID: Jonathon Galvan, MRN: [id number], DOB/AGE: 1945-11-15 68 y.o. Admit date: 05/04/2014 Date of Consult: 05/04/2014  Primary Physician: Quitman Livings, MD Primary Cardiologist: none Referring Physician: Dr Pickering/Dr Molli Knock  Chief Complaint: Cardiac Arrest Reason for Consultation: Out-of-hospital cardiac arrest  HPI: 68 year-old male presents with out-of-hospital cardiac arrest. He is in a wheelchair because of previous left above knee amputation. The patient reportedly was in VF and he received 3 shocks and CPR, with ROSC after resuscitative efforts. His arrest was witnessed. He did apparently hit his head when he fell.   Hx is obtained from the patient's sister, who talked to him earlier today. He stated that he 'didn't feel well' but did not have specific complaints.   She states that he has been in a wheelchair lately because of problems with his prosthesis. He does have chronic pain and is on oxycodone and methadone. He is a current smoker but no longer uses drugs per her report. He lives in an assisted living facility.  Medical History:  Past Medical History  Diagnosis Date  . CHF (congestive heart failure)     EF 30-35%, nonischemic  . CAD (coronary artery disease)   . Hyperlipidemia     hypertriglyceridemia on niaspan and lipitor.  . Asthma   . Allergy   . PVD (peripheral vascular disease)     s/p left AKA @ [facility] (MRSA septic arthritis)  . Diabetes mellitus     dx in 2009, DKA, came unresponsive  . Tobacco abuse   . Hepatitis C   . Hypertension   . LBBB (left bundle branch block)   . Anemia of chronic disease     baseline Hgb now 13  . Thrombocytopenia     07/2007, went down to 90,000. likely 2/2 neurontin.  . Cocaine use     s/p cardiopulmonary arrest 12/25/2002 ([facility])  . Cholelithiasis     documented on CT abdomen in 07/2007      Surgical History:  Past Surgical History  Procedure Laterality Date  . Left leg mrsa  infection s/p aka    . Left shoulder cyst removal       Home Meds: Prior to Admission medications   Medication Sig Start Date End Date Taking? Authorizing Provider  amitriptyline (ELAVIL) 100 MG tablet Take 1 tablet (100 mg total) by mouth at bedtime. 10/03/11   Lars Mage, MD  amLODipine (NORVASC) 10 MG tablet Take 1 tablet (10 mg total) by mouth daily. 10/03/11   Lars Mage, MD  aspirin 81 MG tablet Take 81 mg by mouth daily.      Historical Provider, MD  atorvastatin (LIPITOR) 10 MG tablet Take 1 tablet (10 mg total) by mouth daily. 10/03/11   Lars Mage, MD  bacitracin ointment Apply 1 application topically 2 (two) times daily. 09/02/13   Brandt Loosen, MD  carvedilol (COREG) 25 MG tablet Take 1 tablet (25 mg total) by mouth 2 (two) times daily with a meal. 05/14/11   Lars Mage, MD  cetirizine (ZYRTEC) 10 MG tablet Take 1 tablet (10 mg total) by mouth daily. 10/03/11   Lars Mage, MD  enalapril (VASOTEC) 20 MG tablet Take 1 tablet (20 mg total) by mouth 2 (two) times daily. 10/03/11   Lars Mage, MD  furosemide (LASIX) 80 MG tablet Take 1 tablet (80 mg total) by mouth 2 (two) times daily. 05/14/11   Lars Mage, MD  gabapentin (NEURONTIN) 300 MG capsule Take 1 capsule by  mouth 3 (three) times daily. 08/11/13   Historical Provider, MD  glipiZIDE (GLUCOTROL) 10 MG tablet Take 1 tablet by mouth 2 (two) times daily. 08/11/13   Historical Provider, MD  metFORMIN (GLUCOPHAGE) 1000 MG tablet Take 1 tablet (1,000 mg total) by mouth 2 (two) times daily with a meal. 05/14/11   Lars MageAnkit Garg, MD  morphine (MS CONTIN) 60 MG 12 hr tablet Take 60 mg by mouth 2 (two) times daily. 10/19/10 11/16/13  Lars MageAnkit Garg, MD  niacin (NIASPAN) 500 MG CR tablet Take 1 tablet by mouth daily. 08/11/13   Historical Provider, MD  oxyCODONE-acetaminophen (PERCOCET) 10-325 MG per tablet Take 1 tablet by mouth every 4 (four) hours as needed for pain.    Historical Provider, MD  venlafaxine (EFFEXOR) 50 MG tablet Take 1 tablet (50 mg total)  by mouth at bedtime. 10/03/11   Lars MageAnkit Garg, MD  zolpidem (AMBIEN) 10 MG tablet Take 1 tablet by mouth at bedtime as needed for sleep.  08/11/13   Historical Provider, MD    Inpatient Medications:    . cisatracurium (NIMBEX) infusion    . fentaNYL infusion INTRAVENOUS    . midazolam (VERSED) infusion    . norepinephrine (LEVOPHED) Adult infusion      Allergies: No Known Allergies  History   Social History  . Marital Status: Single    Spouse Name: N/A    Number of Children: N/A  . Years of Education: N/A   Occupational History  . Not on file.   Social History Main Topics  . Smoking status: Current Some Day Smoker -- 3 years  . Smokeless tobacco: Not on file  . Alcohol Use: No  . Drug Use: No  . Sexual Activity: Not on file   Other Topics Concern  . Not on file   Social History Narrative    Patient was given a prescription of zoster vaccine on 09/16/2010     Family History  Problem Relation Age of Onset  . Coronary artery disease Mother     died in her 3660's  . Cirrhosis Father     died from complications  . Coronary artery disease Brother     died of fatal MI     Review of Systems: Unable to Obtain  Physical Exam: Blood pressure 80/43, pulse 56, height 6' (1.829 m), weight 212 lb 15.4 oz (96.6 kg), SpO2 100 %. Pt is unresponsive, intubated HEENT: normal Neck: in C-collar - unable to evaluate carotids/JVP Lungs: equal expansion, clear bilaterally CV: RRR without murmur, distant Abd: soft, NT, +BS, no bruit Back: no CVA tenderness Ext: 1+ RLE edema, 2+ DP pulse, left leg AKA   Skin: stasis changes left lower leg Neuro: unresponsive    Labs: No results for input(s): CKTOTAL, CKMB, TROPONINI in the last 72 hours. Lab Results  Component Value Date   WBC 11.7* 09/02/2013   HGB 15.6 05/04/2014   HCT 46.0 05/04/2014   MCV 80.5 09/02/2013   PLT 245 09/02/2013    Recent Labs Lab 05/04/14 1707  NA 142  K 3.8  CL 106  BUN 13  CREATININE 1.50*    GLUCOSE 117*   Lab Results  Component Value Date   CHOL 130 05/21/2010   HDL 30* 05/21/2010   LDLCALC 71 05/21/2010   TRIG 147 05/21/2010   Lab Results  Component Value Date   DDIMER  08/10/2007    0.44        AT THE INHOUSE ESTABLISHED CUTOFF VALUE OF 0.48 ug/mL FEU,  THIS ASSAY HAS BEEN DOCUMENTED IN THE LITERATURE TO HAVE    Radiology/Studies:  No results found.  EKG: NSR with LBBB  Echo 2011: Study Conclusions  - Left ventricle: Systolic function was moderately to severely  reduced. The estimated ejection fraction was in the range of 30%  to 35%. Features are consistent with a pseudonormal left  ventricular filling pattern, with concomitant abnormal relaxation  and increased filling pressure (grade 2 diastolic dysfunction).  Doppler parameters are consistent with elevated mean left atrial  filling pressure. - Ventricular septum: Septal motion showed severe dyssynergy and  paradox. These changes are consistent with a left bundle branch  block. - Mitral valve: Calcified annulus. Mild regurgitation. - Left atrium: The atrium was moderately dilated.  Cardiac Studies: pending  ASSESSMENT AND PLAN:  1. Out-of-hospital cardiac arrest - VF 2. Known cardiomyopathy with LVEF 30-35% in 2011 by echo 3. LBBB, chronic 4. Shock, suspect cardiogenic 5. Possible head trauma from fall  Discussed with Dr Rubin Payor EDP and Dr Molli Knock CCM. Lines placed in ER. Will do stat CT head and if no bleed will go direct to cath lab for coronary angiography and possible PCI. Discussed risks, indications of cath/PCI with patient's sister. Further plans pending cath results.   Diff dx includes primary VF from cardiomyopathy versus acute MI versus noncardiac causes. Currently in shock requiring dopamine 15 mcg to maintain adequate perfusion pressure. Hypotension precludes hypothermia at present. Will start hypothermia if he stabilizes.   The patient is critically ill with  multiple organ systems failure and requires high complexity decision making for assessment and support, frequent evaluation and titration of therapies, application of advanced monitoring technologies and extensive interpretation of multiple databases.   Critical Care Time devoted to patient care services described in this note is 60 minutes  Signed, [name] MD, [facility] 05/04/2014, 5:32 PM

## 2014-05-04 NOTE — ED Provider Notes (Signed)
CSN: [id number]     Arrival date & time 05/04/14  1637 History   First MD Initiated Contact with Patient 05/04/14 1646     Chief Complaint  Patient presents with  . Cardiac Arrest     (Consider location/radiation/quality/duration/timing/severity/associated sxs/prior Treatment) Patient is a 68 y.o. male presenting with general illness. The history is provided by the EMS personnel. No language interpreter was used.  Illness Location:  Sitting in wheelchari Quality:  Slumped over, found pulseless and apneic Severity:  Severe Onset quality:  Sudden Timing:  Constant Progression:  Worsening Chronicity:  New Context:  Slumped over in wheelchair and foudn by fire department pulseless and apneic. CPR initiated with 3 epis adn ROSC. defib x3.  Relieved by:  Epi and defib Worsened by:  Nothing Ineffective treatments:  None tried Risk factors:  CHF, CAD, HLD, HTN, cocaine abuse   Past Medical History  Diagnosis Date  . CHF (congestive heart failure)     EF 30-35%, nonischemic  . CAD (coronary artery disease)   . Hyperlipidemia     hypertriglyceridemia on niaspan and lipitor.  . Asthma   . Allergy   . PVD (peripheral vascular disease)     s/p left AKA @ [facility] (MRSA septic arthritis)  . Diabetes mellitus     dx in 2009, DKA, came unresponsive  . Tobacco abuse   . Hepatitis C   . Hypertension   . LBBB (left bundle branch block)   . Anemia of chronic disease     baseline Hgb now 13  . Thrombocytopenia     07/2007, went down to 90,000. likely 2/2 neurontin.  . Cocaine use     s/p cardiopulmonary arrest 12/25/2002 ([facility])  . Cholelithiasis     documented on CT abdomen in 07/2007   Past Surgical History  Procedure Laterality Date  . Left leg mrsa infection s/p aka    . Left shoulder cyst removal     Family History  Problem Relation Age of Onset  . Coronary artery disease Mother     died in her 660's  . Cirrhosis Father     died from complications  . Coronary artery  disease Brother     died of fatal MI   History  Substance Use Topics  . Smoking status: Current Some Day Smoker -- 3 years  . Smokeless tobacco: Not on file  . Alcohol Use: No    Review of Systems  Unable to perform ROS: Acuity of condition      Allergies  Review of patient's allergies indicates no known allergies.  Home Medications   Prior to Admission medications   Medication Sig Start Date End Date Taking? Authorizing Provider  amitriptyline (ELAVIL) 100 MG tablet Take 1 tablet (100 mg total) by mouth at bedtime. 10/03/11   Lars MageAnkit Garg, MD  amLODipine (NORVASC) 10 MG tablet Take 1 tablet (10 mg total) by mouth daily. 10/03/11   Lars MageAnkit Garg, MD  aspirin 81 MG tablet Take 81 mg by mouth daily.      Historical Provider, MD  atorvastatin (LIPITOR) 10 MG tablet Take 1 tablet (10 mg total) by mouth daily. 10/03/11   Lars MageAnkit Garg, MD  bacitracin ointment Apply 1 application topically 2 (two) times daily. 09/02/13   Brandt LoosenJulie Manly, MD  carvedilol (COREG) 25 MG tablet Take 1 tablet (25 mg total) by mouth 2 (two) times daily with a meal. 05/14/11   Lars MageAnkit Garg, MD  cetirizine (ZYRTEC) 10 MG tablet Take 1 tablet (10  mg total) by mouth daily. 10/03/11   Lars Mage, MD  enalapril (VASOTEC) 20 MG tablet Take 1 tablet (20 mg total) by mouth 2 (two) times daily. 10/03/11   Lars Mage, MD  furosemide (LASIX) 80 MG tablet Take 1 tablet (80 mg total) by mouth 2 (two) times daily. 05/14/11   Lars Mage, MD  gabapentin (NEURONTIN) 300 MG capsule Take 1 capsule by mouth 3 (three) times daily. 08/11/13   Historical Provider, MD  glipiZIDE (GLUCOTROL) 10 MG tablet Take 1 tablet by mouth 2 (two) times daily. 08/11/13   Historical Provider, MD  metFORMIN (GLUCOPHAGE) 1000 MG tablet Take 1 tablet (1,000 mg total) by mouth 2 (two) times daily with a meal. 05/14/11   Lars Mage, MD  morphine (MS CONTIN) 60 MG 12 hr tablet Take 60 mg by mouth 2 (two) times daily. 10/19/10 11/16/13  Lars Mage, MD  niacin (NIASPAN) 500 MG CR  tablet Take 1 tablet by mouth daily. 08/11/13   Historical Provider, MD  oxyCODONE-acetaminophen (PERCOCET) 10-325 MG per tablet Take 1 tablet by mouth every 4 (four) hours as needed for pain.    Historical Provider, MD  venlafaxine (EFFEXOR) 50 MG tablet Take 1 tablet (50 mg total) by mouth at bedtime. 10/03/11   Lars Mage, MD  zolpidem (AMBIEN) 10 MG tablet Take 1 tablet by mouth at bedtime as needed for sleep.  08/11/13   Historical Provider, MD   BP 133/65 mmHg  Pulse 48  Temp(Src) 91.4 F (33 C) (Core (Comment))  Resp 12  Ht 6' (1.829 m)  Wt 192 lb 14.4 oz (87.5 kg)  BMI 26.16 kg/m2  SpO2 100% Physical Exam  Constitutional: He appears well-developed and well-nourished. No distress.  HENT:  Head: Normocephalic.  Abrasion to right forehead, diaphoretic  Eyes:  Pupils 44mm fixed  Neck:  c-collar on  Cardiovascular:  Bradycardic, weak/thready pulses  Pulmonary/Chest: Effort normal and breath sounds normal.  King airway in place, spontaneous respirations with King Airway in  Abdominal: Soft. He exhibits distension. There is no tenderness. There is no rebound and no guarding.  obese  Musculoskeletal:  Left AKA  Skin: He is diaphoretic.  Nursing note and vitals reviewed.   ED Course  INTUBATION Date/Time: 05/04/2014 5:00 PM Performed by: Fredirick Lathe Authorized by: Billee Cashing Consent: The procedure was performed in an emergent situation. Patient identity confirmed: arm band, hospital-assigned identification number and anonymous protocol, patient vented/unresponsive Indications: airway protection Intubation method: video-assisted Patient status: paralyzed (RSI) Sedatives: etomidate Paralytic: rocuronium Tube size: 7.5 mm Tube type: cuffed Number of attempts: 1 Cords visualized: yes Post-procedure assessment: chest rise and ETCO2 monitor Breath sounds: equal Cuff inflated: yes Tube secured with: ETT holder Chest x-ray findings: endotracheal tube in  appropriate position Patient tolerance: Patient tolerated the procedure well with no immediate complications   (including critical care time) Labs Review Labs Reviewed  COMPREHENSIVE METABOLIC PANEL - Abnormal; Notable for the following:    Glucose, Bld 114 (*)    Creatinine, Ser 1.49 (*)    Albumin 3.1 (*)    AST 42 (*)    GFR calc non Af Amer 46 (*)    GFR calc Af Amer 53 (*)    Anion gap 22 (*)    All other components within normal limits  CBC WITH DIFFERENTIAL - Abnormal; Notable for the following:    WBC 20.6 (*)    Neutro Abs 11.6 (*)    Lymphs Abs 7.6 (*)    All other  components within normal limits  PROTIME-INR - Abnormal; Notable for the following:    Prothrombin Time 15.6 (*)    All other components within normal limits  BASIC METABOLIC PANEL - Abnormal; Notable for the following:    Potassium 3.4 (*)    Glucose, Bld 185 (*)    GFR calc non Af Amer 52 (*)    GFR calc Af Amer 60 (*)    All other components within normal limits  BASIC METABOLIC PANEL - Abnormal; Notable for the following:    Potassium 3.6 (*)    Glucose, Bld 203 (*)    GFR calc non Af Amer 57 (*)    GFR calc Af Amer 66 (*)    All other components within normal limits  GLUCOSE, CAPILLARY - Abnormal; Notable for the following:    Glucose-Capillary 170 (*)    All other components within normal limits  GLUCOSE, CAPILLARY - Abnormal; Notable for the following:    Glucose-Capillary 187 (*)    All other components within normal limits  GLUCOSE, CAPILLARY - Abnormal; Notable for the following:    Glucose-Capillary 190 (*)    All other components within normal limits  GLUCOSE, CAPILLARY - Abnormal; Notable for the following:    Glucose-Capillary 218 (*)    All other components within normal limits  GLUCOSE, CAPILLARY - Abnormal; Notable for the following:    Glucose-Capillary 228 (*)    All other components within normal limits  CBG MONITORING, ED - Abnormal; Notable for the following:     Glucose-Capillary 159 (*)    All other components within normal limits  I-STAT CHEM 8, ED - Abnormal; Notable for the following:    Creatinine, Ser 1.50 (*)    Glucose, Bld 117 (*)    All other components within normal limits  I-STAT CG4 LACTIC ACID, ED - Abnormal; Notable for the following:    Lactic Acid, Venous 7.64 (*)    All other components within normal limits  I-STAT ARTERIAL BLOOD GAS, ED - Abnormal; Notable for the following:    pH, Arterial 7.263 (*)    pCO2 arterial 55.4 (*)    pO2, Arterial 118.0 (*)    Bicarbonate 25.0 (*)    Acid-base deficit 3.0 (*)    All other components within normal limits  POCT I-STAT 3, ART BLOOD GAS (G3+) - Abnormal; Notable for the following:    Bicarbonate 24.1 (*)    All other components within normal limits  MRSA PCR SCREENING  CULTURE, BLOOD (ROUTINE X 2)  CULTURE, BLOOD (ROUTINE X 2)  ETHANOL  LACTIC ACID, PLASMA  MAGNESIUM  TROPONIN I  TROPONIN I  LACTIC ACID, PLASMA  CORTISOL  BLOOD GAS, ARTERIAL  PROTIME-INR  APTT  BASIC METABOLIC PANEL  BASIC METABOLIC PANEL  BASIC METABOLIC PANEL  BASIC METABOLIC PANEL  BASIC METABOLIC PANEL  CBC  MAGNESIUM  PHOSPHORUS  BLOOD GAS, ARTERIAL  MAGNESIUM  BASIC METABOLIC PANEL  BASIC METABOLIC PANEL  BASIC METABOLIC PANEL  BASIC METABOLIC PANEL  I-STAT TROPOININ, ED  TYPE AND SCREEN  ABO/RH    Imaging Review Ct Head Wo Contrast  05/04/2014   CLINICAL DATA:  Syncopal episode. Patient found down. Out of hospital cardiac arrest.  EXAM: CT HEAD WITHOUT CONTRAST  CT CERVICAL SPINE WITHOUT CONTRAST  TECHNIQUE: Multidetector CT imaging of the head and cervical spine was performed following the standard protocol without intravenous contrast. Multiplanar CT image reconstructions of the cervical spine were also generated.  COMPARISON:  Prior CT head  from 08/10/2007.  FINDINGS: CT HEAD FINDINGS  No evidence for acute infarction, hemorrhage, mass lesion, hydrocephalus, or extra-axial fluid. Mild  cerebral and cerebellar atrophy. Hypoattenuation of white matter is consistent with chronic microvascular ischemic change.  The calvarium is intact. There is no sinus or mastoid air fluid level although the ethmoids are opacified. Likely post intubation. The patient is intubated, with accompanying nasopharyngeal fluid.  CT CERVICAL SPINE FINDINGS  There is no visible cervical spine fracture, traumatic subluxation, prevertebral soft tissue swelling, or intraspinal hematoma. Mild straightening of the normal cervical lordosis. Mild ossification of the posterior longitudinal ligament. Multilevel disc space narrowing from C2 through T1. Moderately advanced facet arthropathy at C2-3 on the LEFT. Vascular calcification. Central venous line. Endotracheal tube. No pneumothorax. No worrisome neck mass.  IMPRESSION: Chronic changes as described. Similar appearance to 2009. No acute intracranial findings.  No cervical spine fracture or traumatic subluxation. Advanced multilevel cervical spondylosis is noted.  Visualized support tubes and lines appear appropriately placed.  Findings discussed with consulting MD.   Electronically Signed   By: Davonna Belling M.D.   On: 05/04/2014 18:26   Ct Cervical Spine Wo Contrast  05/04/2014   CLINICAL DATA:  Syncopal episode. Patient found down. Out of hospital cardiac arrest.  EXAM: CT HEAD WITHOUT CONTRAST  CT CERVICAL SPINE WITHOUT CONTRAST  TECHNIQUE: Multidetector CT imaging of the head and cervical spine was performed following the standard protocol without intravenous contrast. Multiplanar CT image reconstructions of the cervical spine were also generated.  COMPARISON:  Prior CT head from 08/10/2007.  FINDINGS: CT HEAD FINDINGS  No evidence for acute infarction, hemorrhage, mass lesion, hydrocephalus, or extra-axial fluid. Mild cerebral and cerebellar atrophy. Hypoattenuation of white matter is consistent with chronic microvascular ischemic change.  The calvarium is intact. There is  no sinus or mastoid air fluid level although the ethmoids are opacified. Likely post intubation. The patient is intubated, with accompanying nasopharyngeal fluid.  CT CERVICAL SPINE FINDINGS  There is no visible cervical spine fracture, traumatic subluxation, prevertebral soft tissue swelling, or intraspinal hematoma. Mild straightening of the normal cervical lordosis. Mild ossification of the posterior longitudinal ligament. Multilevel disc space narrowing from C2 through T1. Moderately advanced facet arthropathy at C2-3 on the LEFT. Vascular calcification. Central venous line. Endotracheal tube. No pneumothorax. No worrisome neck mass.  IMPRESSION: Chronic changes as described. Similar appearance to 2009. No acute intracranial findings.  No cervical spine fracture or traumatic subluxation. Advanced multilevel cervical spondylosis is noted.  Visualized support tubes and lines appear appropriately placed.  Findings discussed with consulting MD.   Electronically Signed   By: Davonna Belling M.D.   On: 05/04/2014 18:26   Dg Chest Portable 1 View  05/04/2014   CLINICAL DATA:  Status post cardiac arrest and CPR.  Intubated.  EXAM: PORTABLE CHEST - 1 VIEW  COMPARISON:  06/13/2010.  FINDINGS: Interval endotracheal tube with its tip 3.6 cm above the carina. Breast of enlargement of the cardiac silhouette with increased prominence of the pulmonary vasculature and interstitial markings. No pleural fluid seen. No pneumothorax. An interval epicardial pacemaker lead is in place. Right subclavian catheter extending into the right neck, its tip not included.  IMPRESSION: 1. Malpositioned right subclavian catheter extending into the neck on the right. 2. Progressive cardiomegaly and changes of congestive heart failure. These results will be called to the ordering clinician or representative by the Radiologist Assistant, and communication documented in the PACS or zVision Dashboard.   Electronically Signed   By:  Gordan Payment M.D.    On: 05/04/2014 17:47   Dg Abd Portable 1v  05/04/2014   CLINICAL DATA:  Orogastric tube placement.  EXAM: PORTABLE ABDOMEN - 1 VIEW  COMPARISON:  08/11/2007.  FINDINGS: Orogastric tube tip in the mid to distal stomach. The visualized bowel gas pattern is normal. The patient is rotated to the right. Possible hiatal hernia and right basilar airspace opacity.  IMPRESSION: 1. Orogastric tube tip in the mid to distal stomach. 2. Possible hiatal hernia. 3. Mild patchy atelectasis or pneumonia at the right lung base.   Electronically Signed   By: Gordan Payment M.D.   On: 05/04/2014 21:39     EKG Interpretation   Date/Time:  Thursday May 04 2014 16:46:08 EST Ventricular Rate:  47 PR Interval:  202 QRS Duration: 178 QT Interval:  603 QTC Calculation: 533 R Axis:     Text Interpretation:  Bradycardia with irregular rate Left bundle branch  block Confirmed by Rubin Payor  MD, Harrold Donath [id number]) on 05/04/2014 4:57:36 PM      MDM   Final diagnoses:  Cardiac arrest  Trauma    5:08 PM Pt is a 68 y.o. male with pertinent PMHX of CAD, CHF, HLD, LBBB, asthma, PVD, DM, cocaine abuse who presents to the ED with post arrest. Was sitting in wheelchair and fell out of wheel chair. EMS called, patient helped back into wheel chair. Upon Fire department arrival patient found to be pulseless and apneic. CPR initiated. Unknown down time, per EMS likely 5 minutes in between patient being found pulseless and apneic and CPR. ROSC after 3 epis, was defibrillated x3 for vfib and brought to [facility] ED  On arrival apneic, King airway in place. Plan for intubation as noted above. Critical care consulted. Patient given 1G of calcium and started on pacing at a rate of 70 BPM  Review of labs: Troponin: 0.76 Lactic: 1.1 Mag: 1.8 CMP: no electrolyte abnormalities, elevated AST, cr. 1.49 ETOH: <11 CBC: leukocytosis, H&H 14.0/42.7 INR 1.23  Ct head so contrast no evidence of intracranial abnormalities  Ct cervical  spine wo contrast showed no acute fracture or dislocation  Central line placement per critical care. To start dopamine  CXR AP portable per my read showed adequate placement of ETT. Cardiomegaly and vascular congestion  Plan per critical care to hold off on hypothermia protocol. Plan for admission to ICU but will go to Cath lab   Labs, EKG and imaging reviewed by myself and considered in medical decision making if ordered.  Imaging interpreted by radiology. Pt was discussed with my attending, Dr. Erlene Quan, MD 05/05/14 0111  Juliet Rude. Rubin Payor, MD 05/08/14 1404

## 2014-05-04 NOTE — Code Documentation (Signed)
Dr. Molli Knock at bedside.  Preparing to place central line

## 2014-05-04 NOTE — ED Notes (Signed)
Pt to ED via [facility] after reported having cardiac arrest.  EMS reports pt was found apneic and pulseless in wheelchair at home.  CPR was started pt was given Epi. X's 3 and shocked x's 3.  Pt regained pulses but remained unconscious.

## 2014-05-04 NOTE — ED Notes (Signed)
Pt to CT at this time.

## 2014-05-04 NOTE — Procedures (Signed)
Central Venous Catheter Insertion Procedure Note Jonathon Galvan [id number] Oct 28, 1945  Procedure: Insertion of Central Venous Catheter Indications: Assessment of intravascular volume, Drug and/or fluid administration and Frequent blood sampling  Procedure Details Consent: Unable to obtain consent because of emergent medical necessity. Time Out: Verified patient identification, verified procedure, site/side was marked, verified correct patient position, special equipment/implants available, medications/allergies/relevent history reviewed, required imaging and test results available.  Performed  Maximum sterile technique was used including antiseptics, cap, gloves, gown, hand hygiene, mask and sheet. Skin prep: Chlorhexidine; local anesthetic administered A antimicrobial bonded/coated triple lumen catheter was placed in the right subclavian vein using the Seldinger technique.  Evaluation Blood flow good Complications: No apparent complications Patient did tolerate procedure well. Chest X-ray ordered to verify placement.  CXR: pending.  U/S used in placement.  Mathan Darroch 05/04/2014, 5:33 PM

## 2014-05-04 NOTE — Code Documentation (Signed)
Pt bradycardic at this time.  Dr. Rubin Payor started pacing pt at rate of 70

## 2014-05-04 NOTE — ED Notes (Signed)
On arrival to ED pt has lg abrasion to right side of forehead. EMS reports pt fell out of wheelchair onto sidewalk at home.

## 2014-05-04 NOTE — H&P (Signed)
PULMONARY / CRITICAL CARE MEDICINE   Name: Jonathon Galvan MRN: [id number] DOB: 28-Jul-1945    ADMISSION DATE:  05/04/2014 CONSULTATION DATE:  05/04/14  REFERRING MD :  EDP  CHIEF COMPLAINT:  Cardiac arrest and respiratory arrest.  INITIAL PRESENTATION: 68 year old male with extensive cardiac history who presents to the hospital after noticing that he fell out of his wheel chair.  After being put back up in the wheel chair was noticed that he was not breathing.  EMS was called.  Patient was in VF.  Shocked with 5 minutes of CPR.  Patient was brought to the ED where he arrested again for 1 minutes and once paced patient normalized.  STUDIES:  None  SIGNIFICANT EVENTS: Cardiac arrest 11/12>>>  PAST MEDICAL HISTORY :   has a past medical history of CHF (congestive heart failure); CAD (coronary artery disease); Hyperlipidemia; Asthma; Allergy; PVD (peripheral vascular disease); Diabetes mellitus; Tobacco abuse; Hepatitis C; Hypertension; LBBB (left bundle branch block); Anemia of chronic disease; Thrombocytopenia; Cocaine use; and Cholelithiasis.  has past surgical history that includes left leg MRSA infection s/p AKA and left shoulder cyst removal. Prior to Admission medications   Medication Sig Start Date End Date Taking? Authorizing Provider  amitriptyline (ELAVIL) 100 MG tablet Take 1 tablet (100 mg total) by mouth at bedtime. 10/03/11   Lars MageAnkit Garg, MD  amLODipine (NORVASC) 10 MG tablet Take 1 tablet (10 mg total) by mouth daily. 10/03/11   Lars MageAnkit Garg, MD  aspirin 81 MG tablet Take 81 mg by mouth daily.      Historical Provider, MD  atorvastatin (LIPITOR) 10 MG tablet Take 1 tablet (10 mg total) by mouth daily. 10/03/11   Lars MageAnkit Garg, MD  bacitracin ointment Apply 1 application topically 2 (two) times daily. 09/02/13   Brandt LoosenJulie Manly, MD  carvedilol (COREG) 25 MG tablet Take 1 tablet (25 mg total) by mouth 2 (two) times daily with a meal. 05/14/11   Lars MageAnkit Garg, MD  cetirizine (ZYRTEC) 10 MG  tablet Take 1 tablet (10 mg total) by mouth daily. 10/03/11   Lars MageAnkit Garg, MD  enalapril (VASOTEC) 20 MG tablet Take 1 tablet (20 mg total) by mouth 2 (two) times daily. 10/03/11   Lars MageAnkit Garg, MD  furosemide (LASIX) 80 MG tablet Take 1 tablet (80 mg total) by mouth 2 (two) times daily. 05/14/11   Lars MageAnkit Garg, MD  gabapentin (NEURONTIN) 300 MG capsule Take 1 capsule by mouth 3 (three) times daily. 08/11/13   Historical Provider, MD  glipiZIDE (GLUCOTROL) 10 MG tablet Take 1 tablet by mouth 2 (two) times daily. 08/11/13   Historical Provider, MD  metFORMIN (GLUCOPHAGE) 1000 MG tablet Take 1 tablet (1,000 mg total) by mouth 2 (two) times daily with a meal. 05/14/11   Lars MageAnkit Garg, MD  morphine (MS CONTIN) 60 MG 12 hr tablet Take 60 mg by mouth 2 (two) times daily. 10/19/10 11/16/13  Lars MageAnkit Garg, MD  niacin (NIASPAN) 500 MG CR tablet Take 1 tablet by mouth daily. 08/11/13   Historical Provider, MD  oxyCODONE-acetaminophen (PERCOCET) 10-325 MG per tablet Take 1 tablet by mouth every 4 (four) hours as needed for pain.    Historical Provider, MD  venlafaxine (EFFEXOR) 50 MG tablet Take 1 tablet (50 mg total) by mouth at bedtime. 10/03/11   Lars MageAnkit Garg, MD  zolpidem (AMBIEN) 10 MG tablet Take 1 tablet by mouth at bedtime as needed for sleep.  08/11/13   Historical Provider, MD   No Known Allergies  FAMILY HISTORY:  has no family status information on file.  SOCIAL HISTORY:  reports that he has been smoking.  He does not have any smokeless tobacco history on file. He reports that he does not drink alcohol or use illicit drugs.  REVIEW OF SYSTEMS:  Unattainable, patient is sedated and intubated.  SUBJECTIVE: Sedated and intubated.  VITAL SIGNS: Weight:  [96.6 kg (212 lb 15.4 oz)] 96.6 kg (212 lb 15.4 oz) (11/12 1700) HEMODYNAMICS:   VENTILATOR SETTINGS:   INTAKE / OUTPUT: No intake or output data in the 24 hours ending 05/04/14 1706  PHYSICAL EXAMINATION: General:  Chronically ill appearing male, sedated and  intubated. Neuro:  Unresponsive, paralyzed chemically. HEENT:  [place]/AT, no pupil reactivity, MMM. Cardiovascular:  RRR, Nl S1/S2, -M/R/G. Lungs:  Coarse BS diffusely. Abdomen:  Soft, NT, ND and +BS. Musculoskeletal:  -edema and -tenderness.  Left leg AKA. Skin:  Intact.  LABS:  CBC No results for input(s): WBC, HGB, HCT, PLT in the last 168 hours. Coag's No results for input(s): APTT, INR in the last 168 hours. BMET No results for input(s): NA, K, CL, CO2, BUN, CREATININE, GLUCOSE in the last 168 hours. Electrolytes No results for input(s): CALCIUM, MG, PHOS in the last 168 hours. Sepsis Markers No results for input(s): LATICACIDVEN, PROCALCITON, O2SATVEN in the last 168 hours. ABG No results for input(s): PHART, PCO2ART, PO2ART in the last 168 hours. Liver Enzymes No results for input(s): AST, ALT, ALKPHOS, BILITOT, ALBUMIN in the last 168 hours. Cardiac Enzymes No results for input(s): TROPONINI, PROBNP in the last 168 hours. Glucose No results for input(s): GLUCAP in the last 168 hours.  Imaging No results found.   ASSESSMENT / PLAN:  PULMONARY OETT 11/12>>> A: VDRF post arrest. P:   - Full vent support. - ABG now. - Adjust vent for ABG. - CXR now. - ABG and CXR in AM.  CARDIOVASCULAR CVL Right [place] TLC 11/12>>> A: VF cardiac arrest.  Now sinus brady. Severe cardiogenic shock. P:  - Cards consult. - Place TLC. - Dopamine for BP support. - To cath lab post head CT. - If head CT is negative then proceed with hypothermia. - ?IABP.  RENAL A:  CRI, Cr 1.5 at this point.  No significant acidosis. P:   - NS 100 ml/hr. - BMET in AM. - Replace electrolytes as indicated.  GASTROINTESTINAL A:  No active issues. P:   - Place OGT. - NPO.  HEMATOLOGIC A:  No active issues. P:  - Monitor. - Transfusion per ICU protocol.  INFECTIOUS A:  No signs of active infection. P:   BCx2 11/12>>> UC 11/12>>> Sputum 11/12>>> Abx: None  ENDOCRINE A:  DM   P:    - CBG - ISS  NEUROLOGIC A:  Fall, unresponsive. P:   RASS goal: -2 - Head CT. - Hypothermia protocol if head CT is negative.   FAMILY  - Updates: No family bedside.  TODAY'S SUMMARY: 68 year old male.  S/P VF arrest, now in severe cardiogenic shock.  If head CT is negative and patient is able to maintain pressure for a MAP of 85 then will cool, if not then will d/c hypothermia.  My CC time of 60 minutes.  Alyson Reedy, M.D. [facility] Pulmonary/Critical Care Medicine. Pager: [phone]. After hours pager: [phone].  05/04/2014, 5:06 PM

## 2014-05-04 NOTE — ED Notes (Signed)
Port chest x-ray obtained at this time.

## 2014-05-04 NOTE — Code Documentation (Signed)
Pt remains in sinus brady rhythm at this time rate of 52

## 2014-05-04 NOTE — ED Notes (Signed)
Dopamine gtt continues to infuse in left wrist without any problems.

## 2014-05-04 NOTE — ED Notes (Signed)
Pt to CT then to cath lab pt remains on vent.

## 2014-05-04 NOTE — Progress Notes (Signed)
Chaplain responded to CPR in progress. Patient reported to ER by ems. Patient sister said that patient fail out of his chair and bumped his head on the cement.   Patient sister was escorted to consultation room and is waiting for doctor's update. Support was continued as I passed this on to on call night Chaplain to follow. Night Chaplain is with patient sister( Ms. Golden Circle).  I provided emotional support, ministry of presence and empathetic listening.  Sister is experiencing high level of anxiety and is  tearful.  Other family expected later.    05/04/14 1700  Clinical Encounter Type  Visited With Family;Patient not available;Health care provider  Visit Type Initial;Spiritual support;ED;Trauma  Referral From Nurse  Spiritual Encounters  Spiritual Needs Prayer;Emotional  Stress Factors  Family Stress Factors Exhausted  ,pager [phone]

## 2014-05-04 NOTE — ED Notes (Signed)
[place], MD aware of abnormal lab test results 

## 2014-05-04 NOTE — CV Procedure (Signed)
   Cardiac Catheterization Procedure Note  Name: Jonathon Galvan MRN: [id number] DOB: [birth year]  Procedure: Left Heart Cath, Selective Coronary Angiography, LV angiography  Indication: Out-of-hospital cardaic arrest (VF). Pt with known cardiomyopathy and LVEF 30-35%, chronic LBBB. Patent coronaries at cath 4 years ago. Because of VF as initial rhythm and hemodynamic instability requiring dopamine, I elected to bring the patient urgently to the cardiac cath lab.   Procedural details: The right groin was prepped, draped, and anesthetized with 1% lidocaine. Using modified Seldinger technique, a 5 French sheath was introduced into the right femoral artery. Standard Judkins catheters were used for coronary angiography and left ventriculography. Catheter exchanges were performed over a guidewire.   Attempts were made to reposition the right subclavian triple-lumen catheter which was malpositioned, but access was lost. Hemostasis was obtained with manual pressure. A right femoral venous triple lumen catheter was placed without difficulty. There were no immediate procedural complications. The patient was transferred to the post catheterization recovery area for further monitoring.  Procedural Findings: Hemodynamics:  AO 111/58 LV 115/16   Coronary angiography: Coronary dominance: right  Left mainstem: Widely patent, large vessel.  Left anterior descending (LAD): vessel courses to the LV apex with mild nonobstructive disease. Large first diagonal without significant stenosis.  Left circumflex (LCx): Large vessel, large first OM without significant stenosis  Right coronary artery (RCA): Large vessel, no obstruction. The mild vessel has a mild irregular plaque with approximately 30% stenosis. The PDA and PLA branches are patent.   Left ventriculography: Moderate global LV dysfunction, LVEF 40-45%.  Final Conclusions:   1. Nonobstructive CAD in the mid-RCA, widely patent LM, LAD, and LCx 2.  Moderate LV dysfunction  Recommendations: Supportive care. Tx ICU. D/W Dr Craige Cotta. Will start hypothermia.  [name] 05/04/2014, 6:55 PM

## 2014-05-04 NOTE — Code Documentation (Signed)
Pacemaker turned down by Dr. Molli Knock.  Pt maintaining heart rate of 52 at this time.

## 2014-05-04 NOTE — Progress Notes (Signed)
Chaplain introduced to pt sister from daytime chaplain. Later pt niece, Deon, arrived to support pt sister. It was very important to pt sister to see her brother and staff facilitated this. Chaplain facilitated information sharing between medical staff and pt family, provided hospitality, offered prayer, and escorted family to [facility] waiting room, where cardiac doctor is expecting them post-cath. Chaplain made family aware of her services should they need them. Chaplain updated ED registration on location of family when more family members arrive.  Page chaplain as needed.    05/04/14 1800  Clinical Encounter Type  Visited With Health care provider;Family  Visit Type Follow-up;Spiritual support  Referral From Chaplain  Spiritual Encounters  Spiritual Needs Prayer;Emotional  Stress Factors  Family Stress Factors Major life changes;Health changes;Family relationships  Danthony Kendrix, Loa Socks 05/04/2014 6:09 PM

## 2014-05-04 NOTE — ED Notes (Signed)
Family at bedside, being updated on plan of care

## 2014-05-05 ENCOUNTER — Inpatient Hospital Stay (HOSPITAL_COMMUNITY): Payer: PRIVATE HEALTH INSURANCE

## 2014-05-05 DIAGNOSIS — I739 Peripheral vascular disease, unspecified: Secondary | ICD-10-CM

## 2014-05-05 DIAGNOSIS — E876 Hypokalemia: Secondary | ICD-10-CM

## 2014-05-05 DIAGNOSIS — I429 Cardiomyopathy, unspecified: Secondary | ICD-10-CM

## 2014-05-05 DIAGNOSIS — N189 Chronic kidney disease, unspecified: Secondary | ICD-10-CM

## 2014-05-05 LAB — BASIC METABOLIC PANEL
Anion gap: 13 (ref 5–15)
Anion gap: 14 (ref 5–15)
Anion gap: 15 (ref 5–15)
Anion gap: 16 — ABNORMAL HIGH (ref 5–15)
BUN: 13 mg/dL (ref 6–23)
BUN: 15 mg/dL (ref 6–23)
BUN: 15 mg/dL (ref 6–23)
BUN: 15 mg/dL (ref 6–23)
CHLORIDE: 105 meq/L (ref 96–112)
CO2: 21 meq/L (ref 19–32)
CO2: 23 mEq/L (ref 19–32)
CO2: 23 mEq/L (ref 19–32)
CO2: 24 meq/L (ref 19–32)
Calcium: 8.1 mg/dL — ABNORMAL LOW (ref 8.4–10.5)
Calcium: 8.8 mg/dL (ref 8.4–10.5)
Calcium: 8.9 mg/dL (ref 8.4–10.5)
Calcium: 9 mg/dL (ref 8.4–10.5)
Chloride: 106 mEq/L (ref 96–112)
Chloride: 107 mEq/L (ref 96–112)
Chloride: 107 mEq/L (ref 96–112)
Creatinine, Ser: 0.98 mg/dL (ref 0.50–1.35)
Creatinine, Ser: 1.22 mg/dL (ref 0.50–1.35)
Creatinine, Ser: 1.25 mg/dL (ref 0.50–1.35)
Creatinine, Ser: 1.26 mg/dL (ref 0.50–1.35)
GFR calc Af Amer: 68 mL/min — ABNORMAL LOW (ref >90)
GFR calc Af Amer: >90 mL/min (ref >90)
GFR calc non Af Amer: 56 mL/min — ABNORMAL LOW (ref >90)
GFR calc non Af Amer: 57 mL/min — ABNORMAL LOW (ref >90)
GFR, EST AFRICAN AMERICAN: 65 mL/min — AB (ref >90)
GFR, EST AFRICAN AMERICAN: 66 mL/min — AB (ref >90)
GFR, EST NON AFRICAN AMERICAN: 58 mL/min — AB (ref >90)
GFR, EST NON AFRICAN AMERICAN: 81 mL/min — AB (ref >90)
GLUCOSE: 109 mg/dL — AB (ref 70–99)
Glucose, Bld: 209 mg/dL — ABNORMAL HIGH (ref 70–99)
Glucose, Bld: 233 mg/dL — ABNORMAL HIGH (ref 70–99)
Glucose, Bld: 234 mg/dL — ABNORMAL HIGH (ref 70–99)
Potassium: 2.8 mEq/L — CL (ref 3.7–5.3)
Potassium: 2.8 mEq/L — CL (ref 3.7–5.3)
Potassium: 3.3 mEq/L — ABNORMAL LOW (ref 3.7–5.3)
Potassium: 3.6 mEq/L — ABNORMAL LOW (ref 3.7–5.3)
Sodium: 143 mEq/L (ref 137–147)
Sodium: 143 mEq/L (ref 137–147)
Sodium: 144 mEq/L (ref 137–147)
Sodium: 144 mEq/L (ref 137–147)

## 2014-05-05 LAB — GLUCOSE, CAPILLARY
GLUCOSE-CAPILLARY: 194 mg/dL — AB (ref 70–99)
GLUCOSE-CAPILLARY: 198 mg/dL — AB (ref 70–99)
GLUCOSE-CAPILLARY: 199 mg/dL — AB (ref 70–99)
GLUCOSE-CAPILLARY: 204 mg/dL — AB (ref 70–99)
GLUCOSE-CAPILLARY: 206 mg/dL — AB (ref 70–99)
GLUCOSE-CAPILLARY: 210 mg/dL — AB (ref 70–99)
GLUCOSE-CAPILLARY: 211 mg/dL — AB (ref 70–99)
GLUCOSE-CAPILLARY: 216 mg/dL — AB (ref 70–99)
GLUCOSE-CAPILLARY: 218 mg/dL — AB (ref 70–99)
Glucose-Capillary: 124 mg/dL — ABNORMAL HIGH (ref 70–99)
Glucose-Capillary: 174 mg/dL — ABNORMAL HIGH (ref 70–99)
Glucose-Capillary: 228 mg/dL — ABNORMAL HIGH (ref 70–99)

## 2014-05-05 LAB — POCT I-STAT, CHEM 8
BUN: 13 mg/dL (ref 6–23)
BUN: 13 mg/dL (ref 6–23)
BUN: 13 mg/dL (ref 6–23)
CHLORIDE: 110 meq/L (ref 96–112)
Calcium, Ion: 1.16 mmol/L (ref 1.13–1.30)
Calcium, Ion: 1.19 mmol/L (ref 1.13–1.30)
Calcium, Ion: 1.2 mmol/L (ref 1.13–1.30)
Chloride: 105 mEq/L (ref 96–112)
Chloride: 104 mEq/L (ref 96–112)
Creatinine, Ser: 1.4 mg/dL — ABNORMAL HIGH (ref 0.50–1.35)
Creatinine, Ser: 1.4 mg/dL — ABNORMAL HIGH (ref 0.50–1.35)
Creatinine, Ser: 1.3 mg/dL (ref 0.50–1.35)
Glucose, Bld: 219 mg/dL — ABNORMAL HIGH (ref 70–99)
Glucose, Bld: 196 mg/dL — ABNORMAL HIGH (ref 70–99)
Glucose, Bld: 171 mg/dL — ABNORMAL HIGH (ref 70–99)
HCT: 45 % (ref 39.0–52.0)
HCT: 45 % (ref 39.0–52.0)
HEMATOCRIT: 46 % (ref 39.0–52.0)
Hemoglobin: 15.3 g/dL (ref 13.0–17.0)
Hemoglobin: 15.6 g/dL (ref 13.0–17.0)
Hemoglobin: 15.3 g/dL (ref 13.0–17.0)
POTASSIUM: 2.8 meq/L — AB (ref 3.7–5.3)
POTASSIUM: 3.9 meq/L (ref 3.7–5.3)
POTASSIUM: 3.6 meq/L — AB (ref 3.7–5.3)
SODIUM: 141 meq/L (ref 137–147)
Sodium: 143 mEq/L (ref 137–147)
Sodium: 144 mEq/L (ref 137–147)
TCO2: 26 mmol/L (ref 0–100)
TCO2: 24 mmol/L (ref 0–100)
TCO2: 27 mmol/L (ref 0–100)

## 2014-05-05 LAB — BLOOD GAS, ARTERIAL
ACID-BASE DEFICIT: 1.4 mmol/L (ref 0.0–2.0)
BICARBONATE: 24.4 meq/L — AB (ref 20.0–24.0)
FIO2: 0.7 %
MECHVT: 620 mL
O2 SAT: 99.3 %
PATIENT TEMPERATURE: 91.4
PEEP: 10 cmH2O
PO2 ART: 212 mmHg — AB (ref 80.0–100.0)
RATE: 12 resp/min
TCO2: 26 mmol/L (ref 0–100)
pCO2 arterial: 43.7 mmHg (ref 35.0–45.0)
pH, Arterial: 7.34 — ABNORMAL LOW (ref 7.350–7.450)

## 2014-05-05 LAB — CBC
HCT: 41.1 % (ref 39.0–52.0)
Hemoglobin: 13.9 g/dL (ref 13.0–17.0)
MCH: 26.9 pg (ref 26.0–34.0)
MCHC: 33.8 g/dL (ref 30.0–36.0)
MCV: 79.5 fL (ref 78.0–100.0)
PLATELETS: 220 10*3/uL (ref 150–400)
RBC: 5.17 MIL/uL (ref 4.22–5.81)
RDW: 13.9 % (ref 11.5–15.5)
WBC: 17.1 10*3/uL — AB (ref 4.0–10.5)

## 2014-05-05 LAB — LACTIC ACID, PLASMA
Lactic Acid, Venous: 1.1 mmol/L (ref 0.5–2.2)
Lactic Acid, Venous: 1.6 mmol/L (ref 0.5–2.2)

## 2014-05-05 LAB — PROTIME-INR
INR: 1.23 (ref 0.00–1.49)
Prothrombin Time: 15.6 seconds — ABNORMAL HIGH (ref 11.6–15.2)

## 2014-05-05 LAB — MAGNESIUM
MAGNESIUM: 2.3 mg/dL (ref 1.5–2.5)
Magnesium: 2.1 mg/dL (ref 1.5–2.5)

## 2014-05-05 LAB — PHOSPHORUS: Phosphorus: 2.4 mg/dL (ref 2.3–4.6)

## 2014-05-05 LAB — APTT: aPTT: 31 seconds (ref 24–37)

## 2014-05-05 LAB — TROPONIN I
Troponin I: 0.47 ng/mL (ref <0.30)
Troponin I: 0.76 ng/mL (ref <0.30)

## 2014-05-05 LAB — PATHOLOGIST SMEAR REVIEW

## 2014-05-05 LAB — CORTISOL: Cortisol, Plasma: 22.9 ug/dL

## 2014-05-05 MED ORDER — ONDANSETRON HCL 4 MG/2ML IJ SOLN: 4.0000 mg | Freq: Four times a day (QID) | INTRAMUSCULAR | Status: DC | PRN

## 2014-05-05 MED ORDER — POTASSIUM CHLORIDE 10 MEQ/50ML IV SOLN
INTRAVENOUS | Status: AC
  Filled 2014-05-05: qty 50

## 2014-05-05 MED ORDER — SODIUM CHLORIDE 0.9 % IJ SOLN: 3.0000 mL | INTRAMUSCULAR | Status: DC | PRN

## 2014-05-05 MED ORDER — ATROPINE SULFATE 0.1 MG/ML IJ SOLN
INTRAMUSCULAR | Status: AC
  Filled 2014-05-05: qty 10

## 2014-05-05 MED ORDER — POTASSIUM CHLORIDE 10 MEQ/50ML IV SOLN
10.0000 meq | INTRAVENOUS | Status: AC
  Administered 2014-05-05 (×6): 10 meq via INTRAVENOUS
  Filled 2014-05-05 (×4): qty 50

## 2014-05-05 MED ORDER — HEPARIN SODIUM (PORCINE) 5000 UNIT/ML IJ SOLN
5000.0000 [IU] | Freq: Three times a day (TID) | INTRAMUSCULAR | Status: DC
  Administered 2014-05-05 – 2014-05-20 (×41): 5000 [IU] via SUBCUTANEOUS
  Filled 2014-05-05 (×47): qty 1

## 2014-05-05 MED ORDER — SODIUM CHLORIDE 0.45 % IV SOLN
INTRAVENOUS | Status: DC
  Administered 2014-05-05 – 2014-05-06 (×2): via INTRAVENOUS
  Filled 2014-05-05 (×4): qty 1000

## 2014-05-05 MED ORDER — ACETAMINOPHEN 325 MG PO TABS
650.0000 mg | ORAL_TABLET | ORAL | Status: DC | PRN
Start: 2014-05-05 — End: 2014-05-07
  Administered 2014-05-07 (×2): 650 mg via ORAL
  Filled 2014-05-05 (×2): qty 2

## 2014-05-05 MED ORDER — POTASSIUM CHLORIDE 10 MEQ/50ML IV SOLN
10.0000 meq | INTRAVENOUS | Status: AC
Start: 2014-05-05 — End: 2014-05-05
  Administered 2014-05-05 (×4): 10 meq via INTRAVENOUS
  Filled 2014-05-05: qty 50

## 2014-05-05 MED ORDER — INSULIN ASPART 100 UNIT/ML ~~LOC~~ SOLN
0.0000 [IU] | SUBCUTANEOUS | Status: DC
Start: 2014-05-05 — End: 2014-05-16
  Administered 2014-05-05: 3 [IU] via SUBCUTANEOUS
  Administered 2014-05-05: 5 [IU] via SUBCUTANEOUS
  Administered 2014-05-05: 3 [IU] via SUBCUTANEOUS
  Administered 2014-05-05 (×2): 5 [IU] via SUBCUTANEOUS
  Administered 2014-05-05 – 2014-05-06 (×2): 2 [IU] via SUBCUTANEOUS
  Administered 2014-05-06: 3 [IU] via SUBCUTANEOUS
  Administered 2014-05-06: 2 [IU] via SUBCUTANEOUS
  Administered 2014-05-07: 3 [IU] via SUBCUTANEOUS
  Administered 2014-05-07: 2 [IU] via SUBCUTANEOUS
  Administered 2014-05-07 – 2014-05-08 (×3): 3 [IU] via SUBCUTANEOUS
  Administered 2014-05-08 – 2014-05-09 (×4): 2 [IU] via SUBCUTANEOUS
  Administered 2014-05-09: 3 [IU] via SUBCUTANEOUS
  Administered 2014-05-09 – 2014-05-10 (×3): 2 [IU] via SUBCUTANEOUS
  Administered 2014-05-10 (×2): 3 [IU] via SUBCUTANEOUS
  Administered 2014-05-10: 2 [IU] via SUBCUTANEOUS
  Administered 2014-05-10 – 2014-05-11 (×4): 3 [IU] via SUBCUTANEOUS
  Administered 2014-05-12: 5 [IU] via SUBCUTANEOUS
  Administered 2014-05-12 (×4): 3 [IU] via SUBCUTANEOUS
  Administered 2014-05-13: 2 [IU] via SUBCUTANEOUS
  Administered 2014-05-13: 5 [IU] via SUBCUTANEOUS
  Administered 2014-05-13: 3 [IU] via SUBCUTANEOUS
  Administered 2014-05-13: 2 [IU] via SUBCUTANEOUS
  Administered 2014-05-13 – 2014-05-14 (×4): 3 [IU] via SUBCUTANEOUS
  Administered 2014-05-14: 2 [IU] via SUBCUTANEOUS
  Administered 2014-05-14 – 2014-05-15 (×3): 3 [IU] via SUBCUTANEOUS
  Administered 2014-05-15: 2 [IU] via SUBCUTANEOUS
  Administered 2014-05-15 – 2014-05-16 (×3): 3 [IU] via SUBCUTANEOUS

## 2014-05-05 MED ORDER — SODIUM CHLORIDE 0.9 % IV SOLN
250.0000 mL | INTRAVENOUS | Status: DC | PRN
  Administered 2014-05-07 – 2014-05-08 (×2): 10 mL/h via INTRAVENOUS

## 2014-05-05 MED ORDER — SODIUM CHLORIDE 0.9 % IJ SOLN
3.0000 mL | Freq: Two times a day (BID) | INTRAMUSCULAR | Status: DC
  Administered 2014-05-06 – 2014-05-08 (×3): 3 mL via INTRAVENOUS

## 2014-05-05 MED ORDER — SODIUM CHLORIDE 0.9 % IV SOLN: 1.0000 mL/kg/h | INTRAVENOUS | Status: DC

## 2014-05-05 NOTE — Progress Notes (Signed)
eLink Physician-Brief Progress Note Patient Name: KENTARIUS LAIBLE DOB: Mar 06, 1946 MRN: [id number]   Date of Service  05/05/2014  HPI/Events of Note  K + 2.8  eICU Interventions  Potassium replaced     Intervention Category Intermediate Interventions: Electrolyte abnormality - evaluation and management  Madie Cahn S. 05/05/2014, 2:52 AM

## 2014-05-05 NOTE — Progress Notes (Signed)
Elink made aware of potassium result of 3.4.

## 2014-05-05 NOTE — Progress Notes (Signed)
Elink notified of potassium 3.3

## 2014-05-05 NOTE — Progress Notes (Signed)
PULMONARY / CRITICAL CARE MEDICINE   Name: WILLE FORDHAM MRN: [id number] DOB: Sep 26, 1945    ADMISSION DATE:  05/04/2014  INITIAL PRESENTATION:  27 M with extensive PMH inc NICM admitted after VF arrest. Hypothermia protocol initiated on admission. 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 LVEF approx 40-45% 11/12 Hypothermia protocol implemented   11/12 CT head: NAICP 11/12 CT neck: No cervical spine fracture or traumatic subluxation. Advanced multilevel cervical spondylosis is noted  INDWELLING DEVICES: R radial A-line 11/12 >> 11/13 ETT 11/12 >>  R femoral CVL 11/12 >>  R femoral arterial sheath 11/12 >>   MICRO DATA: Blood 11/12 >>   ANTIMICROBIALS:     SUBJECTIVE:  Sedated, paralyzed, hypothermic   VITAL SIGNS: Temp:  [90.5 F (32.5 C)-92.8 F (33.8 C)] 91.8 F (33.2 C) (11/13 1200) Pulse Rate:  [41-77] 47 (11/13 1200) Resp:  [7-20] 13 (11/13 1200) BP: (77-154)/(40-80) 154/77 mmHg (11/13 1200) SpO2:  [98 %-100 %] 98 % (11/13 1200) Arterial Line BP: (115-170)/(54-112) 142/74 mmHg (11/13 1230) FiO2 (%):  [40 %-100 %] 40 % (11/13 1200) Weight:  [87.5 kg (192 lb 14.4 oz)-96.6 kg (212 lb 15.4 oz)] 87.8 kg (193 lb 9 oz) (11/13 0419) HEMODYNAMICS: CVP:  [10 mmHg] 10 mmHg VENTILATOR SETTINGS: Vent Mode:  [-] PRVC FiO2 (%):  [40 %-100 %] 40 % Set Rate:  [12 bmp-20 bmp] 12 bmp Vt Set:  [620 mL] 620 mL PEEP:  [5 cmH20-10 cmH20] 5 cmH20 Plateau Pressure:  [19 cmH20-24 cmH20] 20 cmH20 INTAKE / OUTPUT:  Intake/Output Summary (Last 24 hours) at 05/05/14 1238 Last data filed at 05/05/14 1220  Gross per 24 hour  Intake 6200.81 ml  Output   5125 ml  Net 1075.81 ml    PHYSICAL EXAMINATION: General: inutbated sedated, paralyzed Neuro: Pupils react. Rest of exam not possible HEENT: superficial scalp abrasion, C collar in place Cardiovascular:   [place] Foley, no M  Lungs: clear anteriorly Abdomen: soft, diminished BS Ext:  L AKA, 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 Relevant abnormalities are discussed in the A/P section  CXR: edema improved to resolved  ASSESSMENT / PLAN:  PULMONARY A: VDRF post cardiac arrest P:   Cont full vent support - settings reviewed and/or adjusted Cont vent bundle Daily SBT if/when meets criteria  CARDIOVASCULAR A:  VF cardiac arrest Previously documented NICM Chronic LBBB Sinus bradycardia Ventricular ectopy Cardiogenic shock P:  Cont NE to maintain MAP goal of > 85 while hypothermic Cont DA to maintain HR > 40 Further eval and mgmt per Cards  RENAL A:   CRI Refractory hypokalemia due to hypothermia  P:   Monitor BMET intermittently Monitor I/Os Correct electrolytes as indicated IVFs adjusted  GASTROINTESTINAL A:   No issues P:   SUP: IV PPI Consider TFs 11/14 AM after rewarming  HEMATOLOGIC A:   No acute issues P:  DVT px: Begin SQ heparin 11/13 Monitor CBC intermittently Transfuse per usual ICU guidelines  INFECTIOUS A:   No overt infections  P:   Micro and abx as above  ENDOCRINE A:   DM 2 P:   Cont SSI  NEUROLOGIC A:   Post anoxic encephalopathy P:   RASS goal: N/A while on NMB Sedation per hypothermia protocol Daily WUA beginning AM 11/14   FAMILY  - Updates: Multiple family members updated @ bedisde    TODAY'S  SUMMARY:      Billy Fischeravid Simonds, MD ; [facility] Mobile [phone].  After 5:30 PM or weekends, call [phone]  Pulmonary and Critical Care Medicine [facility] Pager: [phone]  05/05/2014, 12:38 PM

## 2014-05-05 NOTE — Progress Notes (Signed)
CRITICAL VALUE ALERT  Critical value received:  K  2.8  Date of notification:  05/05/2014  Time of notification:  0250  Critical value read back:Yes.    Nurse who received alert:  Dub Amis  MD notified (1st page): Elink- Dr Delton Coombes  Time of first call:  0254   MD ordered 6 runs K.

## 2014-05-05 NOTE — Care Management Note (Addendum)
    Page 1 of 2   05/17/2014     12:06:08 PM CARE MANAGEMENT NOTE 05/17/2014  Patient:  Jonathon Galvan, Jonathon Galvan   Account Number:  [id number]  Date Initiated:  05/05/2014  Documentation initiated by:  Junius Creamer  Subjective/Objective Assessment:   adm w cardiac arrest-vent     Action/Plan:   lives alone, pcp dr Lynnda Child, supp sister   Anticipated DC Date:  05/18/2014   Anticipated DC Plan:  SKILLED NURSING FACILITY  In-house referral  Clinical Social Worker      DC Planning Services  CM consult      Choice offered to / List presented to:             Status of service:  Completed, signed off Medicare Important Message given?  YES (If response is "NO", the following Medicare IM given date fields will be blank) Date Medicare IM given:  05/15/2014 Medicare IM given by:  Junius Creamer Date Additional Medicare IM given:  05/11/2014 Additional Medicare IM given by:  Junius Creamer  Discharge Disposition:  SKILLED NURSING FACILITY  Per UR Regulation:  Reviewed for med. necessity/level of care/duration of stay  If discussed at Long Length of Stay Meetings, dates discussed:   05/09/2014  05/11/2014  05/16/2014    Comments:  05-17-14 1157 [name], RN,BSN [phone] Life Vest orders faxed to [facility]. CM did speak with CSW in ref to SNF provider needs to work with life vest. Plan for SNF once medically stable. No further needs from CM at this time. Medicare Important Message given? YES (If response is "NO", the following Medicare IM given date fields will be blank) Date Medicare IM given: Medicare IM given by: [name]  11/24  0923 debbie dowell rn,bsn spoke w phy ther cary and tphy ther rec snf for pt. sw ref has been placed.  11/23 0942 debbie dowell rn,bsn spoke w pt and his sister. sister states may need w/c at disch. will cont to follow and await phy ther eval.

## 2014-05-05 NOTE — Progress Notes (Signed)
INITIAL NUTRITION ASSESSMENT  DOCUMENTATION CODES Per approved criteria  -Not Applicable   INTERVENTION: Once pt is rewarmed recommend initiating Vital AF 1.2 @ 20 ml/hr via OG tube and increase by 10 ml every 4 hours to goal rate of 65 ml/hr.   Tube feeding regimen provides 1872 kcal (102% of needs), 117 grams of protein, and 1263 ml of H2O.   NUTRITION DIAGNOSIS: Inadequate oral intake related to inability to eat as evidenced by NPO status  Goal: Pt to meet >/= 90% of their estimated nutrition needs   Monitor:  Respiratory status, TF initiation and tolerance, weight trends, labs  Reason for Assessment: Ventilator  68 y.o. male  Admitting Dx: Cardiac and respiratory arrest  ASSESSMENT: Pt extensive cardiac history who presents to the hospital after noticing that he fell out of his wheel chair. After being put back up in the wheel chair was noticed that he was not breathing. Pt with hx of left leg MRSA infection s/p AKA  Pt started on hypothermia protocol 11/12 at 2005.  Potassium low Glucose: 194-234  Patient is currently intubated on ventilator support MV: 8 L/min Temp (24hrs), Avg:91.4 F (33 C), Min:90.5 F (32.5 C), Max:92.8 F (33.8 C)  Propofol: none, pt paralyzed  No signs of fat or muscle depletion noted on exam.  RN's at bedside. No family in room currently.  Unclear if pt has had weight loss. Usual weight is 213 lb. Will address with pt/family at next opportunity.   Height: Ht Readings from Last 1 Encounters:  05/04/14 6' (1.829 m)    Weight: Wt Readings from Last 1 Encounters:  05/05/14 193 lb 9 oz (87.8 kg)    Ideal Body Weight: 74.4 kg   % Ideal Body Weight: 118%  Wt Readings from Last 10 Encounters:  05/05/14 193 lb 9 oz (87.8 kg)  09/13/13 213 lb (96.616 kg)  09/02/13 213 lb (96.616 kg)  10/18/10 212 lb 11.2 oz (96.48 kg)  09/26/10 212 lb 6.4 oz (96.344 kg)  09/16/10 215 lb 12.8 oz (97.886 kg)  09/03/10 217 lb (98.431 kg)   05/20/10 212 lb (96.163 kg)  04/29/10 222 lb (100.699 kg)  04/08/10 215 lb 14.4 oz (97.932 kg)    Usual Body Weight: 213 lb  % Usual Body Weight: 91%  BMI:  28.4 - overweight  Estimated Nutritional Needs: Kcal: 1828 Protein: 110-125 grams Fluid: > 1.8 L/day  Skin:  Right AKA Skin tear left hand  Diet Order:    EDUCATION NEEDS: -No education needs identified at this time   Intake/Output Summary (Last 24 hours) at 05/05/14 1420 Last data filed at 05/05/14 1400  Gross per 24 hour  Intake 6433.94 ml  Output   5255 ml  Net 1178.94 ml    Last BM: PTA   Labs:   Recent Labs Lab 05/04/14 2000  05/05/14 0005 05/05/14 0140 05/05/14 0600 05/05/14 0959  NA 140  < > 144 143 143 141  K 3.4*  < > 3.3* 2.8* 2.8* 2.8*  CL 103  < > 107 105 106 110  CO2 22  < > 23 23 21   --   BUN 14  < > 15 15 15 13   CREATININE 1.34  < > 1.25 1.26 1.22 1.40*  CALCIUM 9.5  < > 9.0 8.8 8.9  --   MG 1.8  --   --  2.3 2.1  --   PHOS  --   --   --   --  2.4  --  GLUCOSE 185*  < > 234* 233* 209* 219*  < > = values in this interval not displayed.  CBG (last 3)   Recent Labs  05/05/14 0800 05/05/14 1114 05/05/14 1210  GLUCAP 194* 199* 216*    Scheduled Meds: . antiseptic oral rinse  7 mL Mouth Rinse QID  . artificial tears  1 application Both Eyes 3 times per day  . atropine      . chlorhexidine  15 mL Mouth Rinse BID  . heparin subcutaneous  5,000 Units Subcutaneous 3 times per day  . insulin aspart  0-15 Units Subcutaneous 6 times per day  . pantoprazole (PROTONIX) IV  40 mg Intravenous QHS  . potassium chloride  10 mEq Intravenous Q1 Hr x 4  . [START ON 05/06/2014] sodium chloride  3 mL Intravenous Q12H    Continuous Infusions: . cisatracurium (NIMBEX) infusion 1.5 mcg/kg/min (05/05/14 0800)  . DOPamine 15 mcg/kg/min (05/05/14 1320)  . fentaNYL infusion INTRAVENOUS 175 mcg/hr (05/05/14 1001)  . midazolam (VERSED) infusion 5 mg/hr (05/05/14 0800)  . norepinephrine  (LEVOPHED) Adult infusion Stopped (05/05/14 1220)  . sodium chloride 0.45 % with kcl      Past Medical History  Diagnosis Date  . CHF (congestive heart failure)     EF 30-35%, nonischemic  . CAD (coronary artery disease)   . Hyperlipidemia     hypertriglyceridemia on niaspan and lipitor.  . Asthma   . Allergy   . PVD (peripheral vascular disease)     s/p left AKA @ [facility] (MRSA septic arthritis)  . Diabetes mellitus     dx in 2009, DKA, came unresponsive  . Tobacco abuse   . Hepatitis C   . Hypertension   . LBBB (left bundle branch block)   . Anemia of chronic disease     baseline Hgb now 13  . Thrombocytopenia     07/2007, went down to 90,000. likely 2/2 neurontin.  . Cocaine use     s/p cardiopulmonary arrest 12/25/2002 ([facility])  . Cholelithiasis     documented on CT abdomen in 07/2007    Past Surgical History  Procedure Laterality Date  . Left leg mrsa infection s/p aka    . Left shoulder cyst removal      Kendell BaneHeather Shayon Trompeter RD, LDN, CNSC [phone] Pager [phone] After Hours Pager

## 2014-05-05 NOTE — Progress Notes (Signed)
Pt noted to be throwing runs of ectopy. PVCs vs IVR. EKG obtained per order. Reviewed by CCM and cards. Will continue to monitor.

## 2014-05-05 NOTE — Progress Notes (Signed)
Foley catheter placed per protocol with [place] Perkins, RN. Peri care performed pre procedure as per protocol. Sterile technique used throughout foley placement. Pt tolerated procedure.

## 2014-05-05 NOTE — Progress Notes (Signed)
Paged MD Dorris Fetch in regards to keeping femoral sheath due to radial arterial line not functioning. MD okay with keeping femoral sheath in place at this time.

## 2014-05-05 NOTE — Progress Notes (Signed)
SUBJECTIVE:  Pt seen and examined in AM. Currently intubated and on hypothermic protocol with sinus bradycardia and stable blood pressures. His cardiac catherization was non-obstructive yesterday. Per family he had similar cardiac arrest possibly 10 yrs ago of unknown etiology.   OBJECTIVE:   Vitals:   Filed Vitals:   05/05/14 1000 05/05/14 1100 05/05/14 1115 05/05/14 1200  BP: 151/80 151/77 151/77 154/77  Pulse: 47 47  47  Temp: 90.9 F (32.7 C) 91.8 F (33.2 C)  91.8 F (33.2 C)  TempSrc: Core (Comment) Core (Comment)  Core (Comment)  Resp: 12 12  13   Height:      Weight:      SpO2: 99% 98%  98%   I&O's:   Intake/Output Summary (Last 24 hours) at 05/05/14 1314 Last data filed at 05/05/14 1220  Gross per 24 hour  Intake 6200.81 ml  Output   5125 ml  Net 1075.81 ml   TELEMETRY: Reviewed telemetry pt in BBB and SB:     PHYSICAL EXAM General: hed, in no acute distress Head:   Large contusion on right side  Lungs:  Intubated. Ronchi in anterior fields. Heart:  HRRR S1 S2  No JVD.   Abdomen: Abdomen soft and non-tender Msk:  Left AKA   Extremities:  SCD's. Good radial pulses.   Neuro: Sedated Psych:  Sedated Skin: No rash   LABS: Basic Metabolic Panel:  Recent Labs  16/03/9610/13/15 0140 05/05/14 0600 05/05/14 0959  NA 143 143 141  K 2.8* 2.8* 2.8*  CL 105 106 110  CO2 23 21  --   GLUCOSE 233* 209* 219*  BUN 15 15 13   CREATININE 1.26 1.22 1.40*  CALCIUM 8.8 8.9  --   MG 2.3 2.1  --   PHOS  --  2.4  --    Liver Function Tests:  Recent Labs  05/04/14 1655  AST 42*  ALT 27  ALKPHOS 84  BILITOT 0.6  PROT 6.8  ALBUMIN 3.1*   No results for input(s): LIPASE, AMYLASE in the last 72 hours. CBC:  Recent Labs  05/04/14 1655  05/05/14 0757 05/05/14 0959  WBC 20.6*  --  17.1*  --   NEUTROABS 11.6*  --   --   --   HGB 14.0  < > 13.9 15.3  HCT 42.7  < > 41.1 45.0  MCV 84.2  --  79.5  --   PLT 232  --  220  --   < > = values in this interval not  displayed. Cardiac Enzymes:  Recent Labs  05/05/14 0005 05/05/14 0600  TROPONINI 0.76* 0.47*   BNP: Invalid input(s): POCBNP D-Dimer: No results for input(s): DDIMER in the last 72 hours. Hemoglobin A1C: No results for input(s): HGBA1C in the last 72 hours. Fasting Lipid Panel: No results for input(s): CHOL, HDL, LDLCALC, TRIG, CHOLHDL, LDLDIRECT in the last 72 hours. Thyroid Function Tests: No results for input(s): TSH, T4TOTAL, T3FREE, THYROIDAB in the last 72 hours.  Invalid input(s): FREET3 Anemia Panel: No results for input(s): VITAMINB12, FOLATE, FERRITIN, TIBC, IRON, RETICCTPCT in the last 72 hours. Coag Panel:   Lab Results  Component Value Date   INR 1.23 05/05/2014   INR 1.23 05/04/2014   INR 1.10 06/20/2010    RADIOLOGY: Ct Head Wo Contrast  05/04/2014   CLINICAL DATA:  Syncopal episode. Patient found down. Out of hospital cardiac arrest.  EXAM: CT HEAD WITHOUT CONTRAST  CT CERVICAL SPINE WITHOUT CONTRAST  TECHNIQUE: Multidetector CT imaging  of the head and cervical spine was performed following the standard protocol without intravenous contrast. Multiplanar CT image reconstructions of the cervical spine were also generated.  COMPARISON:  Prior CT head from 08/10/2007.  FINDINGS: CT HEAD FINDINGS  No evidence for acute infarction, hemorrhage, mass lesion, hydrocephalus, or extra-axial fluid. Mild cerebral and cerebellar atrophy. Hypoattenuation of white matter is consistent with chronic microvascular ischemic change.  The calvarium is intact. There is no sinus or mastoid air fluid level although the ethmoids are opacified. Likely post intubation. The patient is intubated, with accompanying nasopharyngeal fluid.  CT CERVICAL SPINE FINDINGS  There is no visible cervical spine fracture, traumatic subluxation, prevertebral soft tissue swelling, or intraspinal hematoma. Mild straightening of the normal cervical lordosis. Mild ossification of the posterior longitudinal ligament.  Multilevel disc space narrowing from C2 through T1. Moderately advanced facet arthropathy at C2-3 on the LEFT. Vascular calcification. Central venous line. Endotracheal tube. No pneumothorax. No worrisome neck mass.  IMPRESSION: Chronic changes as described. Similar appearance to 2009. No acute intracranial findings.  No cervical spine fracture or traumatic subluxation. Advanced multilevel cervical spondylosis is noted.  Visualized support tubes and lines appear appropriately placed.  Findings discussed with consulting Jonathon Galvan.   Electronically Signed   By: Davonna Belling M.D.   On: 05/04/2014 18:26   Ct Cervical Spine Wo Contrast  05/04/2014   CLINICAL DATA:  Syncopal episode. Patient found down. Out of hospital cardiac arrest.  EXAM: CT HEAD WITHOUT CONTRAST  CT CERVICAL SPINE WITHOUT CONTRAST  TECHNIQUE: Multidetector CT imaging of the head and cervical spine was performed following the standard protocol without intravenous contrast. Multiplanar CT image reconstructions of the cervical spine were also generated.  COMPARISON:  Prior CT head from 08/10/2007.  FINDINGS: CT HEAD FINDINGS  No evidence for acute infarction, hemorrhage, mass lesion, hydrocephalus, or extra-axial fluid. Mild cerebral and cerebellar atrophy. Hypoattenuation of white matter is consistent with chronic microvascular ischemic change.  The calvarium is intact. There is no sinus or mastoid air fluid level although the ethmoids are opacified. Likely post intubation. The patient is intubated, with accompanying nasopharyngeal fluid.  CT CERVICAL SPINE FINDINGS  There is no visible cervical spine fracture, traumatic subluxation, prevertebral soft tissue swelling, or intraspinal hematoma. Mild straightening of the normal cervical lordosis. Mild ossification of the posterior longitudinal ligament. Multilevel disc space narrowing from C2 through T1. Moderately advanced facet arthropathy at C2-3 on the LEFT. Vascular calcification. Central venous line.  Endotracheal tube. No pneumothorax. No worrisome neck mass.  IMPRESSION: Chronic changes as described. Similar appearance to 2009. No acute intracranial findings.  No cervical spine fracture or traumatic subluxation. Advanced multilevel cervical spondylosis is noted.  Visualized support tubes and lines appear appropriately placed.  Findings discussed with consulting Jonathon Galvan.   Electronically Signed   By: Davonna Belling M.D.   On: 05/04/2014 18:26   Dg Chest Port 1 View  05/05/2014   CLINICAL DATA:  Evaluate airspace disease  EXAM: PORTABLE CHEST - 1 VIEW  COMPARISON:  05/04/2014  FINDINGS: Endotracheal tube tip between the clavicular heads and carina. A gastric suction tube enters the stomach. Right subclavian central line, malpositioned on the previous study, has been removed.  Unchanged cardiomegaly.  Stable aortic contours.  Pulmonary venous congestion has decreased but there is now hazy and streaky bibasilar opacities. No pneumothorax.  IMPRESSION: 1. New orogastric tube is in good position. 2. Improved pulmonary edema. 3. Worsening basilar aeration, likely layering small effusions and atelectasis. Pneumonia cannot be excluded.  Electronically Signed   By: Tiburcio Pea M.D.   On: 05/05/2014 08:00   Dg Chest Portable 1 View  05/04/2014   CLINICAL DATA:  Status post cardiac arrest and CPR.  Intubated.  EXAM: PORTABLE CHEST - 1 VIEW  COMPARISON:  06/13/2010.  FINDINGS: Interval endotracheal tube with its tip 3.6 cm above the carina. Breast of enlargement of the cardiac silhouette with increased prominence of the pulmonary vasculature and interstitial markings. No pleural fluid seen. No pneumothorax. An interval epicardial pacemaker lead is in place. Right subclavian catheter extending into the right neck, its tip not included.  IMPRESSION: 1. Malpositioned right subclavian catheter extending into the neck on the right. 2. Progressive cardiomegaly and changes of congestive heart failure. These results will be  called to the ordering clinician or representative by the Radiologist Assistant, and communication documented in the PACS or zVision Dashboard.   Electronically Signed   By: Gordan Payment M.D.   On: 05/04/2014 17:47   Dg Abd Portable 1v  05/04/2014   CLINICAL DATA:  Orogastric tube placement.  EXAM: PORTABLE ABDOMEN - 1 VIEW  COMPARISON:  08/11/2007.  FINDINGS: Orogastric tube tip in the mid to distal stomach. The visualized bowel gas pattern is normal. The patient is rotated to the right. Possible hiatal hernia and right basilar airspace opacity.  IMPRESSION: 1. Orogastric tube tip in the mid to distal stomach. 2. Possible hiatal hernia. 3. Mild patchy atelectasis or pneumonia at the right lung base.   Electronically Signed   By: Gordan Payment M.D.   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   PLAN:   Vfib Arrest s/p Non-obstructive cardiac catherization - Pt currently on hypothermic protocol s/p non-obstructive cardiac. Etiology unclear, possible due to non-ischemic cardiomyopathy and chronic LBBB. 2D-echo once rewarmed tomorrow. Consider ICD pending neurological status. 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 Pt with pmh of cocaine abuse. 2D-echo once rewarmed tomorrow. Consider ICD pending neurological status. CXR with CHF.  Hypertension - Currently on pressor support. Pt at home on amlodipine 10 mg daily, enalapril 20 mg BID, lasix 80 mg BID, and coreg 25 mg BID.  Hyperlipidemia - Pt on liptor 10 mg daily at home.  PVD - Pt at home on aspirin 81 mg daily.  Type II DM - Management per primary team. Pt is s/p left  AKA.   Jonathon Brace, Jonathon Galvan  PGY-II IMTS Pager [phone] 05/05/2014  1:14 PM  Attending note to follow.   ATTENDING ATTESTATION:  I have seen and examined the patient along with Dr. Johna Roles on morning rounds during which we reviewed the chart, notes and new data.  I personally reviewed the cardiac cath films.I agree with her findings, examination & recommendations as noted above.  Brief Description: 68 year old gentleman with severe PAD and chronic combined CHF due to what appears to be a nonischemic cardiomyopathy presented with cardiac arrest - with strips indicating ventricular fibrillation. He was taken emergently to the cardiac catheterization lab but was not found to have any significant CAD. 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 I will suspect that given his cardiomyopathy probably had a primary VT event that proceeded into V. Fib. He could have had a bradycardia mediated event, however we will never completely sure.   Key new complaints: remains intubated and sedated on hypothermia protocol. Is on pressors, but blood pressure is well-controlled.  On dopamine for mild rate control as the heart rate has dropped with sedation.  Key examination changes: several abrasions on the head, Cardizem exam is relatively unrevealing.  Difficult with the ventilator, but he may have a soft S4.  Key new findings / data: labs reviewed  PLAN:  For now expected management by [facility] on hypothermia protocol anticipate being warmed this evening. We will need to await the return of any potential neurologic function.  If the patient does improve from a overall clinical standpoint with essentially returned to baseline neurologic function, would consider much physiology evaluation for possible ICD.  He will need a new echocardiogram, however this will be difficult to perform all he is bradycardic and hypothermic. Would recommend waiting until tomorrow.left  ventricular free revealed EF of maybe 40-45%, this would be an improvement from his echo report.unfortunately LVEDP was not recorded.  Agree with continuing dopamine for assistance with his heart rate response.he does not seem to be in any overt respiratory distress.  We will follow along and await further evaluation once he has been weaned from hypothermia protocol and sedation.  Marykay Lex, M.D., M.S.  [address] [place], [place]  [address]  [phone]  05/05/2014 5:47 PM

## 2014-05-05 NOTE — Progress Notes (Signed)
CRITICAL VALUE ALERT  Critical value received:  Troponin 0.76  Date of notification:  05/05/2014  Time of notification:  0110  Critical value read back:Yes.    Nurse who received alert:  Dub Amis  MD notified (1st page):  Dr. Dorris Fetch  Time of first page:  0112   Responding MD:  Dr. Dorris Fetch  Time MD responded:  [phone]

## 2014-05-06 ENCOUNTER — Inpatient Hospital Stay (HOSPITAL_COMMUNITY): Payer: PRIVATE HEALTH INSURANCE

## 2014-05-06 DIAGNOSIS — I4901 Ventricular fibrillation: Principal | ICD-10-CM

## 2014-05-06 DIAGNOSIS — I10 Essential (primary) hypertension: Secondary | ICD-10-CM

## 2014-05-06 DIAGNOSIS — B182 Chronic viral hepatitis C: Secondary | ICD-10-CM

## 2014-05-06 DIAGNOSIS — I5022 Chronic systolic (congestive) heart failure: Secondary | ICD-10-CM

## 2014-05-06 DIAGNOSIS — Z89619 Acquired absence of unspecified leg above knee: Secondary | ICD-10-CM

## 2014-05-06 DIAGNOSIS — I428 Other cardiomyopathies: Secondary | ICD-10-CM

## 2014-05-06 LAB — BASIC METABOLIC PANEL
ANION GAP: 12 (ref 5–15)
Anion gap: 14 (ref 5–15)
Anion gap: 16 — ABNORMAL HIGH (ref 5–15)
Anion gap: 11 (ref 5–15)
BUN: 12 mg/dL (ref 6–23)
BUN: 12 mg/dL (ref 6–23)
BUN: 13 mg/dL (ref 6–23)
BUN: 18 mg/dL (ref 6–23)
CALCIUM: 8 mg/dL — AB (ref 8.4–10.5)
CALCIUM: 8.2 mg/dL — AB (ref 8.4–10.5)
CHLORIDE: 103 meq/L (ref 96–112)
CHLORIDE: 104 meq/L (ref 96–112)
CO2: 24 mEq/L (ref 19–32)
CO2: 23 meq/L (ref 19–32)
CO2: 24 meq/L (ref 19–32)
CO2: 23 mEq/L (ref 19–32)
CREATININE: 1.61 mg/dL — AB (ref 0.50–1.35)
Calcium: 8.4 mg/dL (ref 8.4–10.5)
Calcium: 8.2 mg/dL — ABNORMAL LOW (ref 8.4–10.5)
Chloride: 103 mEq/L (ref 96–112)
Chloride: 106 mEq/L (ref 96–112)
Creatinine, Ser: 1.04 mg/dL (ref 0.50–1.35)
Creatinine, Ser: 0.61 mg/dL (ref 0.50–1.35)
Creatinine, Ser: 0.87 mg/dL (ref 0.50–1.35)
GFR calc Af Amer: >90 mL/min (ref >90)
GFR calc Af Amer: >90 mL/min (ref >90)
GFR calc non Af Amer: 71 mL/min — ABNORMAL LOW (ref >90)
GFR calc non Af Amer: >90 mL/min (ref >90)
GFR calc non Af Amer: 85 mL/min — ABNORMAL LOW (ref >90)
GFR calc non Af Amer: 42 mL/min — ABNORMAL LOW (ref >90)
GFR, EST AFRICAN AMERICAN: 82 mL/min — AB (ref >90)
GFR, EST AFRICAN AMERICAN: 48 mL/min — AB (ref >90)
GLUCOSE: 88 mg/dL (ref 70–99)
GLUCOSE: 116 mg/dL — AB (ref 70–99)
Glucose, Bld: 151 mg/dL — ABNORMAL HIGH (ref 70–99)
Glucose, Bld: 130 mg/dL — ABNORMAL HIGH (ref 70–99)
POTASSIUM: 3.5 meq/L — AB (ref 3.7–5.3)
POTASSIUM: 4.2 meq/L (ref 3.7–5.3)
Potassium: 4.8 mEq/L (ref 3.7–5.3)
Potassium: 4.9 mEq/L (ref 3.7–5.3)
SODIUM: 145 meq/L (ref 137–147)
SODIUM: 139 meq/L (ref 137–147)
Sodium: 141 mEq/L (ref 137–147)
Sodium: 138 mEq/L (ref 137–147)

## 2014-05-06 LAB — GLUCOSE, CAPILLARY
GLUCOSE-CAPILLARY: 106 mg/dL — AB (ref 70–99)
GLUCOSE-CAPILLARY: 127 mg/dL — AB (ref 70–99)
GLUCOSE-CAPILLARY: 88 mg/dL (ref 70–99)
Glucose-Capillary: 116 mg/dL — ABNORMAL HIGH (ref 70–99)
Glucose-Capillary: 115 mg/dL — ABNORMAL HIGH (ref 70–99)
Glucose-Capillary: 180 mg/dL — ABNORMAL HIGH (ref 70–99)

## 2014-05-06 LAB — CBC
HEMATOCRIT: 41 % (ref 39.0–52.0)
Hemoglobin: 13.8 g/dL (ref 13.0–17.0)
MCH: 26.9 pg (ref 26.0–34.0)
MCHC: 33.7 g/dL (ref 30.0–36.0)
MCV: 79.9 fL (ref 78.0–100.0)
Platelets: 202 10*3/uL (ref 150–400)
RBC: 5.13 MIL/uL (ref 4.22–5.81)
RDW: 14.1 % (ref 11.5–15.5)
WBC: 17.4 10*3/uL — ABNORMAL HIGH (ref 4.0–10.5)

## 2014-05-06 MED ORDER — SODIUM CHLORIDE 0.9 % IJ SOLN: 10.0000 mL | INTRAMUSCULAR | Status: DC | PRN

## 2014-05-06 MED ORDER — SODIUM CHLORIDE 0.9 % IJ SOLN
10.0000 mL | Freq: Two times a day (BID) | INTRAMUSCULAR | Status: DC
  Administered 2014-05-06 – 2014-05-08 (×2): 10 mL

## 2014-05-06 MED ORDER — FENTANYL CITRATE 0.05 MG/ML IJ SOLN
INTRAMUSCULAR | Status: AC
  Administered 2014-05-06: 50 ug
  Filled 2014-05-06: qty 2

## 2014-05-06 MED ORDER — CARVEDILOL 3.125 MG PO TABS
3.1250 mg | ORAL_TABLET | Freq: Two times a day (BID) | ORAL | Status: DC
  Administered 2014-05-06 – 2014-05-07 (×2): 3.125 mg via ORAL
  Filled 2014-05-06 (×4): qty 1

## 2014-05-06 MED ORDER — VITAL AF 1.2 CAL PO LIQD
1000.0000 mL | ORAL | Status: DC
  Administered 2014-05-06: 20 mL/h
  Administered 2014-05-07 (×2): 65 mL/h
  Administered 2014-05-07: 60 mL/h
  Administered 2014-05-08 – 2014-05-14 (×7): 1000 mL
  Filled 2014-05-06 (×11): qty 1000

## 2014-05-06 MED ORDER — VITAL HIGH PROTEIN PO LIQD
1000.0000 mL | ORAL | Status: DC
  Filled 2014-05-06 (×2): qty 1000

## 2014-05-06 MED ORDER — NICOTINE 21 MG/24HR TD PT24
21.0000 mg | MEDICATED_PATCH | Freq: Every day | TRANSDERMAL | Status: DC
  Administered 2014-05-06 – 2014-05-08 (×3): 21 mg via TRANSDERMAL
  Filled 2014-05-06 (×3): qty 1

## 2014-05-06 NOTE — Progress Notes (Signed)
Cards PA paged to notify that pt SBP has slowly increased to 160's since Dopamine stopped. HR now also in 90's-low 100's. No home meds restarted.

## 2014-05-06 NOTE — Progress Notes (Addendum)
10mL Versed gtt and Fentanyl gtt wasted in sink with Norwood Levo, RN.

## 2014-05-06 NOTE — Progress Notes (Deleted)
Cards PA paged to notify that pt SBP has slowly increased to 160's since Dopamine stopped. HR now also in 90's-low 100's. No home meds restarted.             

## 2014-05-06 NOTE — Progress Notes (Signed)
  Echocardiogram 2D Echocardiogram has been performed.  Jonathon Galvan 05/06/2014, 9:55 AM

## 2014-05-06 NOTE — Progress Notes (Signed)
CC Intubated, not following commands  SUBJECTIVE:  Remains intubated and off hypothermic protocol ... His cardiac catherization was non-obstructive.  Per family he had similar cardiac arrest possibly 10 yrs ago of unknown etiology. VF noted on arrival, however, may have been primarily VT. EF was in the mid-30's, repeat echo is ordered. Mild troponin elevation, however, declining, probably related to arrest.  OBJECTIVE:   Vitals:   Filed Vitals:   05/06/14 0700 05/06/14 0729 05/06/14 0751 05/06/14 0800  BP:   139/75   Pulse: 68 69 76   Temp: 97.7 F (36.5 C)   97.2 F (36.2 C)  TempSrc: Oral   Core (Comment)  Resp: 14 15 20    Height:      Weight:      SpO2: 100% 100% 100%    I&O's:    Intake/Output Summary (Last 24 hours) at 05/06/14 0908 Last data filed at 05/06/14 0700  Gross per 24 hour  Intake 3208.17 ml  Output   3770 ml  Net -561.83 ml   TELEMETRY: Reviewed telemetry pt in LBBB and sinus in the 70's  PHYSICAL EXAM General: intubated on ventilator, not following commands Head:   Large contusion on right side  Lungs:  Intubated. Ronchi in anterior fields. Heart:  HRRR S1 S2  No JVD.   Abdomen: Abdomen soft and non-tender Msk:  Left AKA   Extremities:  SCD's. Good radial pulses.   Neuro: Sedated Psych:  Sedated Skin: No rash  LABS: Basic Metabolic Panel:  Recent Labs  98/26/41 0140 05/05/14 0600  05/06/14 0215 05/06/14 0630  NA 143 143  < > 141 145  K 2.8* 2.8*  < > 3.5* 4.2  CL 105 106  < > 103 106  CO2 23 21  < > 24 23  GLUCOSE 233* 209*  < > 88 116*  BUN 15 15  < > 12 12  CREATININE 1.26 1.22  < > 1.04 0.61  CALCIUM 8.8 8.9  < > 8.4 8.2*  MG 2.3 2.1  --   --   --   PHOS  --  2.4  --   --   --   < > = values in this interval not displayed. Liver Function Tests:  Recent Labs  05/04/14 1655  AST 42*  ALT 27  ALKPHOS 84  BILITOT 0.6  PROT 6.8  ALBUMIN 3.1*   No results for input(s): LIPASE, AMYLASE in the last 72 hours. CBC:  Recent  Labs  05/04/14 1655  05/05/14 0757  05/05/14 1812 05/06/14 0630  WBC 20.6*  --  17.1*  --   --  17.4*  NEUTROABS 11.6*  --   --   --   --   --   HGB 14.0  < > 13.9  < > 15.3 13.8  HCT 42.7  < > 41.1  < > 45.0 41.0  MCV 84.2  --  79.5  --   --  79.9  PLT 232  --  220  --   --  202  < > = values in this interval not displayed. Cardiac Enzymes:  Recent Labs  05/05/14 0005 05/05/14 0600  TROPONINI 0.76* 0.47*   BNP: Invalid input(s): POCBNP D-Dimer: No results for input(s): DDIMER in the last 72 hours. Hemoglobin A1C: No results for input(s): HGBA1C in the last 72 hours. Fasting Lipid Panel: No results for input(s): CHOL, HDL, LDLCALC, TRIG, CHOLHDL, LDLDIRECT in the last 72 hours. Thyroid Function Tests: No results for input(s):  TSH, T4TOTAL, T3FREE, THYROIDAB in the last 72 hours.  Invalid input(s): FREET3 Anemia Panel: No results for input(s): VITAMINB12, FOLATE, FERRITIN, TIBC, IRON, RETICCTPCT in the last 72 hours. 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   PLAN:   Vfib Arrest s/p Non-obstructive cardiac catherization - Probably related to non-ischemic cardiomyopathy and chronic LBBB. 2D-echo today. Consider ICD or BI-V ICD pending neurological status recovery. 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 Pt with pmh of cocaine abuse. 2D-echo today. Consider ICD or BI-V AICD pending neurological status. CXR with CHF.  Hypertension - remains hypotensive. Essentially rewarmed. Continue dopamine, Keep MAP  >60. Hyperlipidemia - Pt on liptor 10 mg daily at home.  PVD - Pt at home on aspirin 81 mg daily.  Type II DM - Management per primary team. Pt is s/p left AKA.  Chrystie NoseKenneth C. Hilty, MD, Wyoming County Community HospitalFACC Attending Cardiologist [facility]  Chrystie NoseHILTY,Kenneth C, MD  05/06/2014  9:08 AM

## 2014-05-06 NOTE — Progress Notes (Signed)
PULMONARY / CRITICAL CARE MEDICINE   Name: Jonathon Galvan MRN: [id number] DOB: 05/29/1946    ADMISSION DATE:  05/04/2014  INITIAL PRESENTATION:  85 M with extensive PMH inc NICM admitted after VF arrest. Hypothermia protocol initiated on admission. 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 LVEF approx 40-45% 11/12 Hypothermia protocol implemented   11/12 CT head: NAICP 11/12 CT neck: No cervical spine fracture or traumatic subluxation. Advanced multilevel cervical spondylosis is noted 11/14 echo>>>  INDWELLING DEVICES: R radial A-line 11/12 >> 11/13 ETT 11/12 >>  R femoral CVL 11/12 >>  R femoral arterial sheath 11/12 >>   MICRO DATA: Blood 11/12 >>   ANTIMICROBIALS:  SUBJECTIVE:  Normotension now  VITAL SIGNS: Temp:  [91.2 F (32.9 C)-98.8 F (37.1 C)] 98.8 F (37.1 C) (11/14 1121) Pulse Rate:  [47-77] 77 (11/14 1121) Resp:  [12-20] 14 (11/14 1121) BP: (130-166)/(74-88) 139/75 mmHg (11/14 0751) SpO2:  [96 %-100 %] 96 % (11/14 1121) Arterial Line BP: (107-167)/(57-118) 117/81 mmHg (11/14 1121) FiO2 (%):  [40 %] 40 % (11/14 0751) Weight:  [91.1 kg (200 lb 13.4 oz)] 91.1 kg (200 lb 13.4 oz) (11/14 0500) HEMODYNAMICS: CVP:  [5 mmHg-11 mmHg] 5 mmHg VENTILATOR SETTINGS: Vent Mode:  [-] PRVC FiO2 (%):  [40 %] 40 % Set Rate:  [12 bmp] 12 bmp Vt Set:  [470 mL] 620 mL PEEP:  [5 cmH20] 5 cmH20 Plateau Pressure:  [20 cmH20-22 cmH20] 20 cmH20 INTAKE / OUTPUT:  Intake/Output Summary (Last 24 hours) at 05/06/14 1157 Last data filed at 05/06/14 0700  Gross per 24 hour  Intake 2950.16 ml  Output   3320 ml  Net -369.84 ml    PHYSICAL EXAMINATION: General: no movement ext Neuro: per 2 mm, nonreactive, gag present HEENT: superficial scalp abrasion, C collar in place Cardiovascular:  s1 s2 RRR Lungs: ronchi Abdomen: soft, diminished BS Ext:  L  AKA, 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 Relevant abnormalities are discussed in the A/P section  CXR: improved int changes, edema, ett wnl  ASSESSMENT / PLAN:  PULMONARY A: VDRF post cardiac arrest P:   Obtain abg now  likely for sbt, cpap 5 ps 5, goal 2 hr, Unable to extubate until neurostatus improves Neg balance goals  CARDIOVASCULAR A:  VF cardiac arrest Previously documented NICM Chronic LBBB Sinus bradycardia Ventricular ectopy Cardiogenic shock P:  Goal to titrate dop to off if MAP Treat hr if only symptomatic Tele If awaken, likely for icd  RENAL A:   CRI K rising and rewarmed P:   Dc K in bag Chem in pm and am Saline to 50   GASTROINTESTINAL A:   No issues P:   SUP: IV PPI Consider TFs written  HEMATOLOGIC A:   DVt prevention P:  DVT px: Begin SQ heparin 11/13 Monitor CBC intermittently Transfuse per usual ICU guidelines  INFECTIOUS A:   No overt infections, at risk higher with hypothermia P:   Micro and abx as above Follow pcxr further   ENDOCRINE A:   DM 2 P:   Cont SSI  NEUROLOGIC A:   Post anoxic encephalopathy P:   RASS goal: 0 Continued to hold all sedation eeg needed Keep collar on  FAMILY  - Updates: Multiple family members updated @ bedisde 11/14    TODAY'S SUMMARY: normothermia, follow neurostatus, reduce fluids, keep collar, wean  Ccm time 30 min  Jonathon Rossettianiel J. Tyson AliasFeinstein, MD, FACP Pgr: [phone] [facility]

## 2014-05-06 NOTE — Progress Notes (Signed)
eLink Physician-Brief Progress Note Patient Name: Jonathon Galvan DOB: 1945-09-12 MRN: [id number]   Date of Service  05/06/2014  HPI/Events of Note     eICU Interventions    Nicotine patch ordered      Intervention Category Minor Interventions: Routine modifications to care plan (e.g. PRN medications for pain, fever)  Jonathon Galvan R. 05/06/2014, 5:21 PM

## 2014-05-06 NOTE — Progress Notes (Signed)
E-link called to notify that pt now awake, agitated, pulling at tubes/lines, difficult to calm. Fentanyl given IV.

## 2014-05-06 NOTE — Progress Notes (Addendum)
INITIAL NUTRITION ASSESSMENT  DOCUMENTATION CODES Per approved criteria  -Not Applicable   INTERVENTION: Initiate continuous Vital 1.2 @ 20 ml/hr via OGT and increase by 10 ml every 4 hours to goal rate of 65 ml/hr.   Tube feeding regimen provides 1872 kcal (98% of energy and 99% of minimum protein needs),  117 grams of protein, and  ml of H2O.    NUTRITION DIAGNOSIS: Inadequate oral intake related to inability to eat as evidenced by NPO status   Goal: Pt to meet >/= 90% of their estimated nutrition needs     Monitor: changes in respiratory status, nutrition support labs and weights   Reason for Assessment: Enteral nutrition initiation and managment  68 y.o. male  Admitting Dx: Cardiac arrest  ASSESSMENT: Pt admitted following VF arrest . Hypothermia protocol implemented. Patient is currently intubated on ventilator support. He is  now normothermic per MD and ready to intiate tube feeding.  MV:9.5  L/min Temp (24hrs), Avg:94 F (34.4 C), Min:91.2 F (32.9 C), Max:98.8 F (37.1 C)  Propofol: 0 ml/hr. Pt receiving Fentanyl.  Height: Ht Readings from Last 1 Encounters:  05/04/14 6' (1.829 m)    Weight: Wt Readings from Last 1 Encounters:  05/06/14 200 lb 13.4 oz (91.1 kg)    Ideal Body Weight: 178#  % Ideal Body Weight: 113%  Wt Readings from Last 10 Encounters:  05/06/14 200 lb 13.4 oz (91.1 kg)  09/13/13 213 lb (96.616 kg)  09/02/13 213 lb (96.616 kg)  10/18/10 212 lb 11.2 oz (96.48 kg)  09/26/10 212 lb 6.4 oz (96.344 kg)  09/16/10 215 lb 12.8 oz (97.886 kg)  09/03/10 217 lb (98.431 kg)  05/20/10 212 lb (96.163 kg)  04/29/10 222 lb (100.699 kg)  04/08/10 215 lb 14.4 oz (97.932 kg)    Usual Body Weight: 213#  % Usual Body Weight: 94%  BMI:  Body mass index is 27.23 kg/(m^2). overweight  Estimated Nutritional Needs: Kcal: 1906 Protein: 118-137gr Fluid: 1.9 liters daily  Skin: no issues noted  Diet Order:    EDUCATION NEEDS: -No education  needs identified at this time   Intake/Output Summary (Last 24 hours) at 05/06/14 1310 Last data filed at 05/06/14 1200  Gross per 24 hour  Intake 3447.44 ml  Output   2620 ml  Net 827.44 ml    Last BM:    Labs:   Recent Labs Lab 05/04/14 2000  05/05/14 0140 05/05/14 0600  05/06/14 0215 05/06/14 0630 05/06/14 1000  NA 140  < > 143 143  < > 141 145 139  K 3.4*  < > 2.8* 2.8*  < > 3.5* 4.2 4.8  CL 103  < > 105 106  < > 103 106 103  CO2 22  < > 23 21  < > 24 23 24   BUN 14  < > 15 15  < > 12 12 13   CREATININE 1.34  < > 1.26 1.22  < > 1.04 0.61 0.87  CALCIUM 9.5  < > 8.8 8.9  < > 8.4 8.2* 8.0*  MG 1.8  --  2.3 2.1  --   --   --   --   PHOS  --   --   --  2.4  --   --   --   --   GLUCOSE 185*  < > 233* 209*  < > 88 116* 151*  < > = values in this interval not displayed.  CBG (last 3)   Recent Labs  05/06/14 0411 05/06/14 0725 05/06/14 1149  GLUCAP 88 116* 115*    Scheduled Meds: . antiseptic oral rinse  7 mL Mouth Rinse QID  . artificial tears  1 application Both Eyes 3 times per day  . chlorhexidine  15 mL Mouth Rinse BID  . feeding supplement (VITAL HIGH PROTEIN)  1,000 mL Per Tube Q24H  . heparin subcutaneous  5,000 Units Subcutaneous 3 times per day  . insulin aspart  0-15 Units Subcutaneous 6 times per day  . pantoprazole (PROTONIX) IV  40 mg Intravenous QHS  . sodium chloride  10-40 mL Intracatheter Q12H  . sodium chloride  3 mL Intravenous Q12H    Continuous Infusions: . cisatracurium (NIMBEX) infusion Stopped (05/06/14 0729)  . DOPamine Stopped (05/06/14 1248)  . fentaNYL infusion INTRAVENOUS Stopped (05/06/14 0942)  . midazolam (VERSED) infusion Stopped (05/06/14 [id number])    Past Medical History  Diagnosis Date  . CHF (congestive heart failure)     EF 30-35%, nonischemic  . CAD (coronary artery disease)   . Hyperlipidemia     hypertriglyceridemia on niaspan and lipitor.  . Asthma   . Allergy   . PVD (peripheral vascular disease)     s/p left  AKA @ [facility] (MRSA septic arthritis)  . Diabetes mellitus     dx in 2009, DKA, came unresponsive  . Tobacco abuse   . Hepatitis C   . Hypertension   . LBBB (left bundle branch block)   . Anemia of chronic disease     baseline Hgb now 13  . Thrombocytopenia     07/2007, went down to 90,000. likely 2/2 neurontin.  . Cocaine use     s/p cardiopulmonary arrest 12/25/2002 ([facility])  . Cholelithiasis     documented on CT abdomen in 07/2007    Past Surgical History  Procedure Laterality Date  . Left leg mrsa infection s/p aka    . Left shoulder cyst removal      Royann ShiversLynn Shaili Donalson MS,RD,CSG,LDN Office: #[phone] Pager: [phone]

## 2014-05-07 ENCOUNTER — Inpatient Hospital Stay (HOSPITAL_COMMUNITY): Payer: PRIVATE HEALTH INSURANCE

## 2014-05-07 DIAGNOSIS — Z89612 Acquired absence of left leg above knee: Secondary | ICD-10-CM

## 2014-05-07 DIAGNOSIS — E1100 Type 2 diabetes mellitus with hyperosmolarity without nonketotic hyperglycemic-hyperosmolar coma (NKHHC): Secondary | ICD-10-CM

## 2014-05-07 DIAGNOSIS — J9602 Acute respiratory failure with hypercapnia: Secondary | ICD-10-CM

## 2014-05-07 DIAGNOSIS — E872 Acidosis: Secondary | ICD-10-CM

## 2014-05-07 DIAGNOSIS — J9690 Respiratory failure, unspecified, unspecified whether with hypoxia or hypercapnia: Secondary | ICD-10-CM

## 2014-05-07 DIAGNOSIS — Z89611 Acquired absence of right leg above knee: Secondary | ICD-10-CM

## 2014-05-07 LAB — CBC WITH DIFFERENTIAL/PLATELET
BASOS ABS: 0 10*3/uL (ref 0.0–0.1)
Basophils Relative: 0 % (ref 0–1)
Eosinophils Absolute: 0.1 10*3/uL (ref 0.0–0.7)
Eosinophils Relative: 1 % (ref 0–5)
HCT: 42 % (ref 39.0–52.0)
Hemoglobin: 14 g/dL (ref 13.0–17.0)
Lymphocytes Relative: 13 % (ref 12–46)
Lymphs Abs: 2.5 10*3/uL (ref 0.7–4.0)
MCH: 27 pg (ref 26.0–34.0)
MCHC: 33.3 g/dL (ref 30.0–36.0)
MCV: 80.9 fL (ref 78.0–100.0)
Monocytes Absolute: 1.7 10*3/uL — ABNORMAL HIGH (ref 0.1–1.0)
Monocytes Relative: 9 % (ref 3–12)
NEUTROS PCT: 78 % — AB (ref 43–77)
Neutro Abs: 15.2 10*3/uL — ABNORMAL HIGH (ref 1.7–7.7)
PLATELETS: 202 10*3/uL (ref 150–400)
RBC: 5.19 MIL/uL (ref 4.22–5.81)
RDW: 14.9 % (ref 11.5–15.5)
WBC: 19.6 10*3/uL — ABNORMAL HIGH (ref 4.0–10.5)

## 2014-05-07 LAB — COMPREHENSIVE METABOLIC PANEL
ALK PHOS: 93 U/L (ref 39–117)
ALT: 26 U/L (ref 0–53)
AST: 37 U/L (ref 0–37)
Albumin: 2.7 g/dL — ABNORMAL LOW (ref 3.5–5.2)
Anion gap: 13 (ref 5–15)
BUN: 19 mg/dL (ref 6–23)
CALCIUM: 8.5 mg/dL (ref 8.4–10.5)
CO2: 24 meq/L (ref 19–32)
Chloride: 105 mEq/L (ref 96–112)
Creatinine, Ser: 1.54 mg/dL — ABNORMAL HIGH (ref 0.50–1.35)
GFR, EST AFRICAN AMERICAN: 51 mL/min — AB (ref >90)
GFR, EST NON AFRICAN AMERICAN: 44 mL/min — AB (ref >90)
GLUCOSE: 125 mg/dL — AB (ref 70–99)
Potassium: 4.3 mEq/L (ref 3.7–5.3)
Sodium: 142 mEq/L (ref 137–147)
Total Bilirubin: 0.5 mg/dL (ref 0.3–1.2)
Total Protein: 6.3 g/dL (ref 6.0–8.3)

## 2014-05-07 LAB — URINALYSIS, ROUTINE W REFLEX MICROSCOPIC
Bilirubin Urine: NEGATIVE
Glucose, UA: NEGATIVE mg/dL
Ketones, ur: NEGATIVE mg/dL
Leukocytes, UA: NEGATIVE
NITRITE: NEGATIVE
Protein, ur: 100 mg/dL — AB
SPECIFIC GRAVITY, URINE: 1.012 (ref 1.005–1.030)
UROBILINOGEN UA: 1 mg/dL (ref 0.0–1.0)
pH: 5 (ref 5.0–8.0)

## 2014-05-07 LAB — GLUCOSE, CAPILLARY
GLUCOSE-CAPILLARY: 132 mg/dL — AB (ref 70–99)
GLUCOSE-CAPILLARY: 169 mg/dL — AB (ref 70–99)
Glucose-Capillary: 120 mg/dL — ABNORMAL HIGH (ref 70–99)
Glucose-Capillary: 170 mg/dL — ABNORMAL HIGH (ref 70–99)
Glucose-Capillary: 132 mg/dL — ABNORMAL HIGH (ref 70–99)
Glucose-Capillary: 83 mg/dL (ref 70–99)

## 2014-05-07 LAB — URINE MICROSCOPIC-ADD ON

## 2014-05-07 MED ORDER — MIDAZOLAM HCL 2 MG/2ML IJ SOLN
2.0000 mg | Freq: Once | INTRAMUSCULAR | Status: AC
  Administered 2014-05-07: 2 mg via INTRAVENOUS

## 2014-05-07 MED ORDER — FENTANYL CITRATE 0.05 MG/ML IJ SOLN
50.0000 ug | INTRAMUSCULAR | Status: DC | PRN
  Administered 2014-05-07 – 2014-05-09 (×2): 50 ug via INTRAVENOUS

## 2014-05-07 MED ORDER — CARVEDILOL 12.5 MG PO TABS: 12.5000 mg | ORAL_TABLET | Freq: Two times a day (BID) | ORAL | Status: DC

## 2014-05-07 MED ORDER — DOCUSATE SODIUM 50 MG/5ML PO LIQD
100.0000 mg | Freq: Two times a day (BID) | ORAL | Status: DC | PRN
  Filled 2014-05-07: qty 10

## 2014-05-07 MED ORDER — MIDAZOLAM HCL 2 MG/2ML IJ SOLN
INTRAMUSCULAR | Status: AC
  Filled 2014-05-07: qty 2

## 2014-05-07 MED ORDER — SODIUM CHLORIDE 0.9 % IV SOLN
50.0000 ug/h | INTRAVENOUS | Status: DC
  Administered 2014-05-07 – 2014-05-08 (×2): 50 ug/h via INTRAVENOUS
  Administered 2014-05-08: 200 ug/h via INTRAVENOUS
  Administered 2014-05-08: 125 ug/h via INTRAVENOUS
  Administered 2014-05-09: 175 ug/h via INTRAVENOUS
  Administered 2014-05-11: 125 ug/h via INTRAVENOUS
  Administered 2014-05-11: 100 ug/h via INTRAVENOUS
  Administered 2014-05-12: 50 ug/h via INTRAVENOUS
  Filled 2014-05-07 (×8): qty 50

## 2014-05-07 MED ORDER — SODIUM CHLORIDE 0.9 % IV SOLN
3.0000 g | Freq: Four times a day (QID) | INTRAVENOUS | Status: DC
  Administered 2014-05-07 – 2014-05-09 (×8): 3 g via INTRAVENOUS
  Filled 2014-05-07 (×10): qty 3

## 2014-05-07 MED ORDER — DEXMEDETOMIDINE HCL IN NACL 200 MCG/50ML IV SOLN
0.0000 ug/kg/h | INTRAVENOUS | Status: DC
  Administered 2014-05-07: 0.6 ug/kg/h via INTRAVENOUS
  Administered 2014-05-07: 0.2 ug/kg/h via INTRAVENOUS
  Administered 2014-05-07 – 2014-05-08 (×2): 0.4 ug/kg/h via INTRAVENOUS
  Administered 2014-05-08: 1 ug/kg/h via INTRAVENOUS
  Administered 2014-05-08: 0.5 ug/kg/h via INTRAVENOUS
  Administered 2014-05-08: 0.6 ug/kg/h via INTRAVENOUS
  Administered 2014-05-08 (×3): 1 ug/kg/h via INTRAVENOUS
  Administered 2014-05-09 (×2): 0.6 ug/kg/h via INTRAVENOUS
  Administered 2014-05-09: 0.8 ug/kg/h via INTRAVENOUS
  Filled 2014-05-07 (×13): qty 50

## 2014-05-07 MED ORDER — FENTANYL CITRATE 0.05 MG/ML IJ SOLN
50.0000 ug | INTRAMUSCULAR | Status: AC | PRN
  Administered 2014-05-07 (×3): 50 ug via INTRAVENOUS
  Filled 2014-05-07 (×3): qty 2

## 2014-05-07 MED ORDER — CARVEDILOL 12.5 MG PO TABS
12.5000 mg | ORAL_TABLET | Freq: Two times a day (BID) | ORAL | Status: DC
  Administered 2014-05-07 – 2014-05-21 (×23): 12.5 mg via ORAL
  Filled 2014-05-07 (×30): qty 1

## 2014-05-07 MED ORDER — ISOSORB DINITRATE-HYDRALAZINE 20-37.5 MG PO TABS
1.0000 | ORAL_TABLET | Freq: Two times a day (BID) | ORAL | Status: DC
  Administered 2014-05-07 – 2014-05-11 (×9): 1 via ORAL
  Filled 2014-05-07 (×11): qty 1

## 2014-05-07 MED ORDER — FENTANYL CITRATE 0.05 MG/ML IJ SOLN
INTRAMUSCULAR | Status: AC
  Administered 2014-05-07: 100 ug
  Filled 2014-05-07: qty 2

## 2014-05-07 MED ORDER — DOCUSATE SODIUM 50 MG/5ML PO LIQD
100.0000 mg | Freq: Two times a day (BID) | ORAL | Status: DC | PRN
  Filled 2014-05-07 (×2): qty 10

## 2014-05-07 MED ORDER — HYDRALAZINE HCL 20 MG/ML IJ SOLN
10.0000 mg | INTRAMUSCULAR | Status: DC | PRN
  Administered 2014-05-14: 10 mg via INTRAVENOUS
  Filled 2014-05-07: qty 1

## 2014-05-07 NOTE — Progress Notes (Signed)
Suctioned for lab

## 2014-05-07 NOTE — Progress Notes (Signed)
Pt agitated, pulling at tubes, moving restlessly in bed. Does not calm to verbal reassurance and does not follow commands reliably. 3rd dose of PRN Fentanyl given as ordered and Fentanyl gtt started as ordered.

## 2014-05-07 NOTE — Progress Notes (Addendum)
R femoral arterial sheath pulled at 1305. Manual pressure held for . Sterile dsg applied over site and R groin CL. No bruising, bleeding, hematoma noted. Cap refill in R leg remains <3 secs and pulse 2+ unchanged from prior to removal. During removal pt did become hypotensive, Precedex titrated accordingly and BP did show improvement. During preparation for removal, pt became agitated and did not follow commands to keep leg still, Fentanyl increased at that point. Otherwise, pt tolerated procedure well. Tube feeds stopped d/t increased risk for aspiration.

## 2014-05-07 NOTE — Progress Notes (Signed)
eLink Physician-Brief Progress Note Patient Name: Jonathon Galvan DOB: Sep 07, 1945 MRN: [id number]   Date of Service  05/07/2014  HPI/Events of Note  Pt awake and agitated  eICU Interventions  PAD protocol intiated      Intervention Category Major Interventions: Delirium, psychosis, severe agitation - evaluation and management  Shan Levans 05/07/2014, 3:39 AM

## 2014-05-07 NOTE — Progress Notes (Addendum)
PULMONARY / CRITICAL CARE MEDICINE   Name: Juanda BondWilliam D Lamprecht MRN: [id number] DOB: [birth year]    ADMISSION DATE:  05/04/2014  INITIAL PRESENTATION:  [age] M with extensive PMH inc NICM admitted after VF arrest. Hypothermia protocol initiated on admission. 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 LVEF approx 40-45% 11/12 Hypothermia protocol implemented   11/12 CT head: NAICP 11/12 CT neck: No cervical spine fracture or traumatic subluxation. Advanced multilevel cervical spondylosis is noted 11/14 echo>>>30% to 35%. Dyskinesis of the mid-apicalanteroseptal myocardium. Doppler parameters are consistent with abnormal left ventricular relaxation (grade 1 diastolic dysfunction). 11/16 eeg>>>  INDWELLING DEVICES: R radial A-line 11/12 >> 11/13 ETT 11/12 >>  R femoral CVL 11/12 >>  R femoral arterial sheath 11/12 >> 11/15  MICRO DATA: Blood 11/12 >>   ANTIMICROBIALS:  SUBJECTIVE:  agitation   VITAL SIGNS: Temp:  [97.7 F (36.5 C)-99.5 F (37.5 C)] 99.5 F (37.5 C) (11/15 0938) Pulse Rate:  [52-102] 78 (11/15 1133) Resp:  [8-28] 24 (11/15 1133) BP: (93-180)/(57-107) 142/69 mmHg (11/15 1133) SpO2:  [94 %-100 %] 97 % (11/15 1133) Arterial Line BP: (91-188)/(53-107) 138/77 mmHg (11/15 1100) FiO2 (%):  [40 %] 40 % (11/15 1133) Weight:  [92.5 kg (203 lb 14.8 oz)] 92.5 kg (203 lb 14.8 oz) (11/15 0500) HEMODYNAMICS: CVP:  [3 mmHg-9 mmHg] 9 mmHg VENTILATOR SETTINGS: Vent Mode:  [-] PSV;CPAP FiO2 (%):  [40 %] 40 % Set Rate:  [12 bmp] 12 bmp Vt Set:  [284[620 mL] 620 mL PEEP:  [5 cmH20] 5 cmH20 Pressure Support:  [5 cmH20-8 cmH20] 8 cmH20 Plateau Pressure:  [17 cmH20-22 cmH20] 17 cmH20 INTAKE / OUTPUT:  Intake/Output Summary (Last 24 hours) at 05/07/14 1155 Last data filed at 05/07/14 0900  Gross per 24 hour  Intake 1333.63 ml  Output   1455 ml  Net  -121.37 ml    PHYSICAL EXAMINATION: General: agitation  Neuro: per 3 mm, moves all ext equal not following commands HEENT: superficial scalp abrasion, C collar in place Cardiovascular:  s1 s2 RRR Lungs: ronchi reduced bases Abdomen: soft, diminished BS Ext:  L AKA, 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 Relevant abnormalities are discussed in the A/P section  XLK:[id number]:unchanged   ASSESSMENT / PLAN:  PULMONARY A: VDRF post cardiac arrest P:   Wean cpap 5 ps 5, goal 2 hr Unable to extubate until agitation level and airway secretions improve Does NOT have to follow commands to extubate Consider lasix soon  CARDIOVASCULAR A:  VF cardiac arrest Previously documented NICM Chronic LBBB Sinus bradycardia Ventricular ectopy Cardiogenic shock P:  Echo reviewed, int prom on pcxr, consider lasix Dc cvp, accuracy? Tele If awaken, likely for icd  RENAL A:   CRI K rising and rewarmed atn from arrest improved P:   Chem in am Saline to 50 , kvo Lasix likely in am   GASTROINTESTINAL A:   Npo 11/14, hep c? P:   SUP: IV PPI Tf to goal Send hep panel Low threshold lactulose Need clarification  HEMATOLOGIC A:   DVt prevention P:  DVT px: SQ heparin Monitor CBC intermittently Transfuse per usual ICU guidelines  INFECTIOUS A:   Number needed to harm hypothermia 12-14 PNA / sepsis At risk aspiration, normothermia blanket indicated fever P:   UA pcxr unasyn sputum  ENDOCRINE A:   DM 2 P:   Cont SSI  NEUROLOGIC A:   Post anoxic encephalopathy H/o cocaine, methadone (active?) P:   RASS goal: 0 Start precedex fent to int as able eeg needed Monday  Keep collar on until can clear clinicaly  FAMILY  - Updates: Multiple family members updated @ bedisde 11/14    TODAY'S SUMMARY:precedex add, wean, keep collar, lasix soon, echo reviewed, unasyn, sputum  Ccm time 30 min  Mcarthur Rossetti. Tyson Alias, MD, FACP Pgr:  [phone] [facility]

## 2014-05-07 NOTE — Progress Notes (Signed)
CC Intubated, agitation overnight, hypertensive  SUBJECTIVE:  Remains intubated but off hypothermic protocol ... His cardiac catherization was non-obstructive.  Per family he had similar cardiac arrest possibly 10 yrs ago of unknown etiology. VF noted on arrival, however, may have been primarily VT. EF remains in the 30-35% range by echo yesterday.  OBJECTIVE:   Vitals:   Filed Vitals:   05/07/14 0600 05/07/14 0615 05/07/14 0722 05/07/14 0803  BP: 174/101  164/77 153/83  Pulse: 96 90 95 98  Temp: 97.9 F (36.6 C)  98.2 F (36.8 C)   TempSrc: Core (Comment)  Core (Comment)   Resp: 22 19 20 18   Height:      Weight:      SpO2: 99% 98% 99% 99%   I&O's:    Intake/Output Summary (Last 24 hours) at 05/07/14 0818 Last data filed at 05/07/14 0700  Gross per 24 hour  Intake 1568.93 ml  Output   1180 ml  Net 388.93 ml   TELEMETRY: Reviewed telemetry pt in LBBB and sinus in the 70's  PHYSICAL EXAM General: intubated on ventilator, not following commands Head:   Large contusion on right side  Lungs:  Intubated. Ronchi in anterior fields. Heart:  HRRR S1 S2  No JVD.   Abdomen: Abdomen soft and non-tender Msk:  Left AKA   Extremities:  SCD's. Good radial pulses.   Neuro: Sedated Psych:  Sedated Skin: No rash  LABS: Basic Metabolic Panel:  Recent Labs  16/03/9610/13/15 0140 05/05/14 0600  05/06/14 2219 05/07/14 0417  NA 143 143  < > 138 142  K 2.8* 2.8*  < > 4.9 4.3  CL 105 106  < > 104 105  CO2 23 21  < > 23 24  GLUCOSE 233* 209*  < > 130* 125*  BUN 15 15  < > 18 19  CREATININE 1.26 1.22  < > 1.61* 1.54*  CALCIUM 8.8 8.9  < > 8.2* 8.5  MG 2.3 2.1  --   --   --   PHOS  --  2.4  --   --   --   < > = values in this interval not displayed. Liver Function Tests:  Recent Labs  05/04/14 1655 05/07/14 0417  AST 42* 37  ALT 27 26  ALKPHOS 84 93  BILITOT 0.6 0.5  PROT 6.8 6.3  ALBUMIN 3.1* 2.7*   No results for input(s): LIPASE, AMYLASE in the last 72  hours. CBC:  Recent Labs  05/04/14 1655  05/06/14 0630 05/07/14 0417  WBC 20.6*  < > 17.4* 19.6*  NEUTROABS 11.6*  --   --  15.2*  HGB 14.0  < > 13.8 14.0  HCT 42.7  < > 41.0 42.0  MCV 84.2  < > 79.9 80.9  PLT 232  < > 202 202  < > = values in this interval not displayed. Cardiac Enzymes:  Recent Labs  05/05/14 0005 05/05/14 0600  TROPONINI 0.76* 0.47*   BNP: Invalid input(s): POCBNP D-Dimer: No results for input(s): DDIMER in the last 72 hours. Hemoglobin A1C: No results for input(s): HGBA1C in the last 72 hours. Fasting Lipid Panel: No results for input(s): CHOL, HDL, LDLCALC, TRIG, CHOLHDL, LDLDIRECT in the last 72 hours. Thyroid Function Tests: No results for input(s): TSH, T4TOTAL, T3FREE, THYROIDAB in the last 72 hours.  Invalid input(s): FREET3 Anemia Panel: No results for input(s): VITAMINB12, FOLATE, FERRITIN, TIBC, IRON, RETICCTPCT in the last 72 hours. 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   PLAN:   Agitation/delerium - related to possible HIE, arrest or maybe withdrawal given history of methadone and tobacco abuse Vfib Arrest s/p Non-obstructive cardiac catherization - Probably related to non-ischemic cardiomyopathy and chronic LBBB. 2D-echo today. Consider ICD or BI-V ICD pending neurological status recovery. 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 Consider ICD or BI-V AICD pending neurological status.  Hypertension - BP has recovered. Off arctic sun. Re-initiate anti-hypertensives today. Hyperlipidemia - Pt on liptor 10 mg daily at home.   PVD - Pt at home on aspirin 81 mg daily.  Type II DM - Management per primary team. Pt is s/p left AKA.  Chrystie Nose, MD, [facility] Attending Cardiologist [facility]  Chrystie Nose, MD  05/07/2014  8:18 AM

## 2014-05-08 ENCOUNTER — Inpatient Hospital Stay (HOSPITAL_COMMUNITY): Payer: PRIVATE HEALTH INSURANCE

## 2014-05-08 ENCOUNTER — Encounter (HOSPITAL_COMMUNITY): Payer: Self-pay | Admitting: *Deleted

## 2014-05-08 DIAGNOSIS — J9601 Acute respiratory failure with hypoxia: Secondary | ICD-10-CM

## 2014-05-08 DIAGNOSIS — G936 Cerebral edema: Secondary | ICD-10-CM | POA: Diagnosis present

## 2014-05-08 LAB — CBC WITH DIFFERENTIAL/PLATELET
Basophils Absolute: 0 10*3/uL (ref 0.0–0.1)
Basophils Relative: 0 % (ref 0–1)
Eosinophils Absolute: 0.1 10*3/uL (ref 0.0–0.7)
Eosinophils Relative: 1 % (ref 0–5)
HEMATOCRIT: 35.7 % — AB (ref 39.0–52.0)
Hemoglobin: 11.7 g/dL — ABNORMAL LOW (ref 13.0–17.0)
LYMPHS ABS: 2.1 10*3/uL (ref 0.7–4.0)
Lymphocytes Relative: 13 % (ref 12–46)
MCH: 27 pg (ref 26.0–34.0)
MCHC: 32.8 g/dL (ref 30.0–36.0)
MCV: 82.4 fL (ref 78.0–100.0)
MONO ABS: 1.4 10*3/uL — AB (ref 0.1–1.0)
MONOS PCT: 9 % (ref 3–12)
NEUTROS ABS: 12.7 10*3/uL — AB (ref 1.7–7.7)
NEUTROS PCT: 77 % (ref 43–77)
Platelets: 160 10*3/uL (ref 150–400)
RBC: 4.33 MIL/uL (ref 4.22–5.81)
RDW: 15.1 % (ref 11.5–15.5)
WBC: 16.3 10*3/uL — ABNORMAL HIGH (ref 4.0–10.5)

## 2014-05-08 LAB — COMPREHENSIVE METABOLIC PANEL
ALK PHOS: 67 U/L (ref 39–117)
ALT: 19 U/L (ref 0–53)
ANION GAP: 15 (ref 5–15)
AST: 29 U/L (ref 0–37)
Albumin: 2.1 g/dL — ABNORMAL LOW (ref 3.5–5.2)
BUN: 22 mg/dL (ref 6–23)
CALCIUM: 8.1 mg/dL — AB (ref 8.4–10.5)
CO2: 21 mEq/L (ref 19–32)
Chloride: 109 mEq/L (ref 96–112)
Creatinine, Ser: 1.56 mg/dL — ABNORMAL HIGH (ref 0.50–1.35)
GFR calc non Af Amer: 43 mL/min — ABNORMAL LOW (ref >90)
GFR, EST AFRICAN AMERICAN: 50 mL/min — AB (ref >90)
GLUCOSE: 105 mg/dL — AB (ref 70–99)
POTASSIUM: 4 meq/L (ref 3.7–5.3)
SODIUM: 145 meq/L (ref 137–147)
Total Bilirubin: 0.6 mg/dL (ref 0.3–1.2)
Total Protein: 5.7 g/dL — ABNORMAL LOW (ref 6.0–8.3)

## 2014-05-08 LAB — GLUCOSE, CAPILLARY
GLUCOSE-CAPILLARY: 161 mg/dL — AB (ref 70–99)
GLUCOSE-CAPILLARY: 130 mg/dL — AB (ref 70–99)
Glucose-Capillary: 106 mg/dL — ABNORMAL HIGH (ref 70–99)
Glucose-Capillary: 120 mg/dL — ABNORMAL HIGH (ref 70–99)
Glucose-Capillary: 115 mg/dL — ABNORMAL HIGH (ref 70–99)
Glucose-Capillary: 156 mg/dL — ABNORMAL HIGH (ref 70–99)

## 2014-05-08 LAB — HEPATITIS PANEL, ACUTE
HCV AB: REACTIVE — AB
HEP B S AG: NEGATIVE
Hep A IgM: NONREACTIVE
Hep B C IgM: NONREACTIVE

## 2014-05-08 LAB — AMMONIA: Ammonia: 61 umol/L — ABNORMAL HIGH (ref 11–60)

## 2014-05-08 LAB — HCV RNA QUANT
HCV QUANT: 4161240 [IU]/mL — AB (ref <15)
HCV Quantitative Log: 6.62 {Log} — ABNORMAL HIGH (ref <1.18)

## 2014-05-08 MED ORDER — MIDAZOLAM HCL 2 MG/2ML IJ SOLN
2.0000 mg | Freq: Once | INTRAMUSCULAR | Status: AC
  Administered 2014-05-08: 2 mg via INTRAVENOUS

## 2014-05-08 MED ORDER — FENTANYL BOLUS VIA INFUSION
50.0000 ug | Freq: Once | INTRAVENOUS | Status: AC
  Administered 2014-05-08: 50 ug via INTRAVENOUS
  Filled 2014-05-08: qty 50

## 2014-05-08 MED ORDER — MIDAZOLAM HCL 2 MG/2ML IJ SOLN
INTRAMUSCULAR | Status: AC
  Filled 2014-05-08: qty 2

## 2014-05-08 MED ORDER — DEXTROSE 5 % IV SOLN
0.0000 ug/min | INTRAVENOUS | Status: DC
  Filled 2014-05-08: qty 4

## 2014-05-08 MED ORDER — ATORVASTATIN CALCIUM 10 MG PO TABS
10.0000 mg | ORAL_TABLET | Freq: Every day | ORAL | Status: DC
  Administered 2014-05-08 – 2014-05-20 (×12): 10 mg via ORAL
  Filled 2014-05-08 (×14): qty 1

## 2014-05-08 MED ORDER — FUROSEMIDE 10 MG/ML IJ SOLN
40.0000 mg | Freq: Four times a day (QID) | INTRAMUSCULAR | Status: AC
  Administered 2014-05-08 (×3): 40 mg via INTRAVENOUS
  Filled 2014-05-08 (×3): qty 4

## 2014-05-08 MED ORDER — LORAZEPAM 2 MG/ML IJ SOLN
0.5000 mg | INTRAMUSCULAR | Status: DC | PRN
  Administered 2014-05-09: 1 mg via INTRAVENOUS
  Filled 2014-05-08 (×2): qty 1

## 2014-05-08 MED ORDER — FENTANYL BOLUS VIA INFUSION
50.0000 ug | Freq: Once | INTRAVENOUS | Status: AC
Start: 2014-05-08 — End: 2014-05-08
  Administered 2014-05-08: 50 ug via INTRAVENOUS
  Filled 2014-05-08: qty 50

## 2014-05-08 NOTE — Progress Notes (Signed)
SUBJECTIVE:  Pt seen and examined in AM. No acute events overnight. He is still intubated with intermittent agitation per nurse. Telemetry with NSR and BBB (chronic). His right femoral arterial sheath was pulled with no bleeding or hematoma noted. Pt to have EEG today and started on IV lasix therapy.     OBJECTIVE:   Vitals:   Filed Vitals:   05/08/14 0730 05/08/14 0800 05/08/14 0900 05/08/14 0909  BP: 97/45 114/53 120/45 113/49  Pulse: 65 69 69 64  Temp:      TempSrc:      Resp: 26 26 0   Height:      Weight:      SpO2: 100% 99% 100%    I&O's:    Intake/Output Summary (Last 24 hours) at 05/08/14 0942 Last data filed at 05/08/14 0800  Gross per 24 hour  Intake 2694.11 ml  Output   1315 ml  Net 1379.11 ml   TELEMETRY: Reviewed telemetry pt in BBB and SB:     PHYSICAL EXAM General: Intubated and sedated Head:   Large contusion on right side  Lungs:  Intubated. Ronchi in anterior fields. Heart:  HRRR S1 S2  No JVD.   Abdomen: Abdomen soft and non-tender Msk:  Left AKA   Extremities:  SCD's. Good radial pulses.   Neuro: Sedated with intermittent agitation  Psych:  Sedated Skin: No rash   LABS: Basic Metabolic Panel:  Recent Labs  16/10/96 0417 05/08/14 0243  NA 142 145  K 4.3 4.0  CL 105 109  CO2 24 21  GLUCOSE 125* 105*  BUN 19 22  CREATININE 1.54* 1.56*  CALCIUM 8.5 8.1*   Liver Function Tests:  Recent Labs  05/07/14 0417 05/08/14 0243  AST 37 29  ALT 26 19  ALKPHOS 93 67  BILITOT 0.5 0.6  PROT 6.3 5.7*  ALBUMIN 2.7* 2.1*   No results for input(s): LIPASE, AMYLASE in the last 72 hours. CBC:  Recent Labs  05/07/14 0417 05/08/14 0243  WBC 19.6* 16.3*  NEUTROABS 15.2* 12.7*  HGB 14.0 11.7*  HCT 42.0 35.7*  MCV 80.9 82.4  PLT 202 160   Cardiac Enzymes: No results for input(s): CKTOTAL, CKMB, CKMBINDEX, TROPONINI in the last 72 hours. BNP: Invalid input(s): POCBNP D-Dimer: No results for input(s): DDIMER in the last 72  hours. Hemoglobin A1C: No results for input(s): HGBA1C in the last 72 hours. Fasting Lipid Panel: No results for input(s): CHOL, HDL, LDLCALC, TRIG, CHOLHDL, LDLDIRECT in the last 72 hours. Thyroid Function Tests: No results for input(s): TSH, T4TOTAL, T3FREE, THYROIDAB in the last 72 hours.  Invalid input(s): FREET3 Anemia Panel: No results for input(s): VITAMINB12, FOLATE, FERRITIN, TIBC, IRON, RETICCTPCT in the last 72 hours. Coag Panel:   Lab Results  Component Value Date   INR 1.23 05/05/2014   INR 1.23 05/04/2014   INR 1.10 06/20/2010    RADIOLOGY: Ct Head Wo Contrast  05/04/2014   CLINICAL DATA:  Syncopal episode. Patient found down. Out of hospital cardiac arrest.  EXAM: CT HEAD WITHOUT CONTRAST  CT CERVICAL SPINE WITHOUT CONTRAST  TECHNIQUE: Multidetector CT imaging of the head and cervical spine was performed following the standard protocol without intravenous contrast. Multiplanar CT image reconstructions of the cervical spine were also generated.  COMPARISON:  Prior CT head from 08/10/2007.  FINDINGS: CT HEAD FINDINGS  No evidence for acute infarction, hemorrhage, mass lesion, hydrocephalus, or extra-axial fluid. Mild cerebral and cerebellar atrophy. Hypoattenuation of white matter is consistent with chronic  microvascular ischemic change.  The calvarium is intact. There is no sinus or mastoid air fluid level although the ethmoids are opacified. Likely post intubation. The patient is intubated, with accompanying nasopharyngeal fluid.  CT CERVICAL SPINE FINDINGS  There is no visible cervical spine fracture, traumatic subluxation, prevertebral soft tissue swelling, or intraspinal hematoma. Mild straightening of the normal cervical lordosis. Mild ossification of the posterior longitudinal ligament. Multilevel disc space narrowing from C2 through T1. Moderately advanced facet arthropathy at C2-3 on the LEFT. Vascular calcification. Central venous line. Endotracheal tube. No pneumothorax.  No worrisome neck mass.  IMPRESSION: Chronic changes as described. Similar appearance to 2009. No acute intracranial findings.  No cervical spine fracture or traumatic subluxation. Advanced multilevel cervical spondylosis is noted.  Visualized support tubes and lines appear appropriately placed.  Findings discussed with consulting MD.   Electronically Signed   By: Davonna Belling M.D.   On: 05/04/2014 18:26   Ct Cervical Spine Wo Contrast  05/04/2014   CLINICAL DATA:  Syncopal episode. Patient found down. Out of hospital cardiac arrest.  EXAM: CT HEAD WITHOUT CONTRAST  CT CERVICAL SPINE WITHOUT CONTRAST  TECHNIQUE: Multidetector CT imaging of the head and cervical spine was performed following the standard protocol without intravenous contrast. Multiplanar CT image reconstructions of the cervical spine were also generated.  COMPARISON:  Prior CT head from 08/10/2007.  FINDINGS: CT HEAD FINDINGS  No evidence for acute infarction, hemorrhage, mass lesion, hydrocephalus, or extra-axial fluid. Mild cerebral and cerebellar atrophy. Hypoattenuation of white matter is consistent with chronic microvascular ischemic change.  The calvarium is intact. There is no sinus or mastoid air fluid level although the ethmoids are opacified. Likely post intubation. The patient is intubated, with accompanying nasopharyngeal fluid.  CT CERVICAL SPINE FINDINGS  There is no visible cervical spine fracture, traumatic subluxation, prevertebral soft tissue swelling, or intraspinal hematoma. Mild straightening of the normal cervical lordosis. Mild ossification of the posterior longitudinal ligament. Multilevel disc space narrowing from C2 through T1. Moderately advanced facet arthropathy at C2-3 on the LEFT. Vascular calcification. Central venous line. Endotracheal tube. No pneumothorax. No worrisome neck mass.  IMPRESSION: Chronic changes as described. Similar appearance to 2009. No acute intracranial findings.  No cervical spine fracture or  traumatic subluxation. Advanced multilevel cervical spondylosis is noted.  Visualized support tubes and lines appear appropriately placed.  Findings discussed with consulting MD.   Electronically Signed   By: Davonna Belling M.D.   On: 05/04/2014 18:26   Dg Chest Port 1 View  05/08/2014   CLINICAL DATA:  Ventilator dependent respiratory failure. Acute respiratory acidosis. Shortness of breath. Prior history of CHF.  EXAM: PORTABLE CHEST - 1 VIEW  COMPARISON:  Portable chest x-rays yesterday dating back to 05/04/2014.  FINDINGS: Endotracheal tube tip in satisfactory position projecting approximately 3 cm above the carina. Nasogastric tube courses below the diaphragm into the stomach. Cardiac silhouette moderately to markedly enlarged but stable. Interval development of mild-to-moderate diffuse interstitial and airspace pulmonary edema since yesterday. Developing bilateral pleural effusions and associated dense passive atelectasis in the lower lobes.  IMPRESSION: 1. Support apparatus satisfactory. 2. New moderate CHF, with interstitial and airspace pulmonary edema and moderate-sized bilateral pleural effusions. Associated passive atelectasis in the lower lobes.   Electronically Signed   By: Hulan Saas M.D.   On: 05/08/2014 07:43   Dg Chest Port 1 View  05/07/2014   CLINICAL DATA:  Cardiac arrest, history of asthma  EXAM: PORTABLE CHEST - 1 VIEW  COMPARISON:  05/06/2014  FINDINGS: Cardiomediastinal silhouette is stable. Endotracheal tube in place with tip 4.5 cm above the carina. Stable NG tube position. Mild basilar atelectasis again noted. No segmental infiltrate or pulmonary edema  IMPRESSION: Stable support apparatus. Again noted mild basilar atelectasis. No segmental infiltrate or pulmonary edema.   Electronically Signed   By: Natasha MeadLiviu  Pop M.D.   On: 05/07/2014 11:22   Dg Chest Port 1 View  05/06/2014   CLINICAL DATA:  Respiratory failure  EXAM: PORTABLE CHEST - 1 VIEW  COMPARISON:  05/05/1949   FINDINGS: An endotracheal tube is again noted 2.8 cm above the carina. A nasogastric catheter is noted within the stomach. The cardiac shadow remains enlarged. Mild bibasilar atelectatic changes are seen. No significant vascular congestion is noted.  IMPRESSION: Mild bibasilar atelectatic changes.  Tubes and lines as described.   Electronically Signed   By: Alcide CleverMark  Lukens M.D.   On: 05/06/2014 07:22   Dg Chest Port 1 View  05/05/2014   CLINICAL DATA:  Evaluate airspace disease  EXAM: PORTABLE CHEST - 1 VIEW  COMPARISON:  05/04/2014  FINDINGS: Endotracheal tube tip between the clavicular heads and carina. A gastric suction tube enters the stomach. Right subclavian central line, malpositioned on the previous study, has been removed.  Unchanged cardiomegaly.  Stable aortic contours.  Pulmonary venous congestion has decreased but there is now hazy and streaky bibasilar opacities. No pneumothorax.  IMPRESSION: 1. New orogastric tube is in good position. 2. Improved pulmonary edema. 3. Worsening basilar aeration, likely layering small effusions and atelectasis. Pneumonia cannot be excluded.   Electronically Signed   By: Tiburcio PeaJonathan  Watts M.D.   On: 05/05/2014 08:00   Dg Chest Portable 1 View  05/04/2014   CLINICAL DATA:  Status post cardiac arrest and CPR.  Intubated.  EXAM: PORTABLE CHEST - 1 VIEW  COMPARISON:  06/13/2010.  FINDINGS: Interval endotracheal tube with its tip 3.6 cm above the carina. Breast of enlargement of the cardiac silhouette with increased prominence of the pulmonary vasculature and interstitial markings. No pleural fluid seen. No pneumothorax. An interval epicardial pacemaker lead is in place. Right subclavian catheter extending into the right neck, its tip not included.  IMPRESSION: 1. Malpositioned right subclavian catheter extending into the neck on the right. 2. Progressive cardiomegaly and changes of congestive heart failure. These results will be called to the ordering clinician or  representative by the Radiologist Assistant, and communication documented in the PACS or zVision Dashboard.   Electronically Signed   By: Gordan PaymentSteve  Reid M.D.   On: 05/04/2014 17:47   Dg Abd Portable 1v  05/04/2014   CLINICAL DATA:  Orogastric tube placement.  EXAM: PORTABLE ABDOMEN - 1 VIEW  COMPARISON:  08/11/2007.  FINDINGS: Orogastric tube tip in the mid to distal stomach. The visualized bowel gas pattern is normal. The patient is rotated to the right. Possible hiatal hernia and right basilar airspace opacity.  IMPRESSION: 1. Orogastric tube tip in the mid to distal stomach. 2. Possible hiatal hernia. 3. Mild patchy atelectasis or pneumonia at the right lung base.   Electronically Signed   By: Gordan PaymentSteve  Reid M.D.   On: 05/04/2014 21:39   Principal Problem:   Cardiac arrest Active Problems:   Chronic hepatitis C   DM2 (diabetes mellitus, type 2)   Essential hypertension   Peripheral vascular disease   VF (ventricular fibrillation)   Nonischemic cardiomyopathy   Hx of AKA (above knee amputation)   Acute respiratory acidosis   Respiratory failure  ASSESSMENT: 68 yo man with pmh of non-ishemic cardiomyapthy with EF 30-35% on echo in 2011, HTN, HL, PVD, Type II DM s/p left AKA, past cocaine abuse, tobacco abuse, and chronic LBBB who presented with vfib arrest with ROSC found to have non-obstructive catherization.   PLAN:   Vfib Arrest s/p Non-obstructive cardiac catherization -  Pt s/p hypothermic protocol and non-obstructive cardiac. Etiology unclear, possible due to non-ischemic cardiomyopathy in setting of chronic LBBB. Consider ICD pending neurological status. Plan for EEG,  CT head, and neurology consult today.  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 Also with grade 1 diastolic dysfunction. Pt with pmh of cocaine abuse. Consider ICD pending neurological status. CXR this AM with new moderate-sized pleural effusions and  pulmonary edema. Today started on IV lasix 40 mg Q 6 hrs for 3 doses per PCCM (pt at home on lasix 80 mg BID). Consider starting home enalapril.   Hypertension - Off pressor support. Currently on carvedilol 12.5 mg BID and bidil 20-37.5 mg BID. Today started on IV lasix 40 mg Q 6 hrs for 3 doses per PCCM (pt at home on lasix 80 mg BID).  Pt at home on amlodipine 10 mg daily, enalapril 20 mg BID, lasix 80 mg BID, and coreg 25 mg BID.  Acute Normocytic Anemia - Hg drop from 14 to 11.7 today. Obtain FOBT. UA with trace hematuria. Currently on SQ heparin for DVT ppx. Monitor for bleeding.  CKD Stage 3 - Cr. 1.56 near baseline 1.5. Continue to monitor in setting of diuresis.  Hyperlipidemia - Will restart home liptor 10 mg daily.  PVD - Pt at home on aspirin 81 mg daily. Hold in setting of acute anemia.  Non-Insulin Dependent Type II DM - Management per primary team. Pt is s/p left AKA. Obtain A1c. Hold home metformin and glipizide. Pt is on moderate SSI.  Hepatitis C Ab positive - Obtain HCV viral load to confirm diagnosis.  Obtain Anti-sHBV to assess for HBV vaccination.    Otis Brace, MD  PGY-II IMTS Pager [phone] 05/08/2014  9:42 AM  Attending note to follow.

## 2014-05-08 NOTE — Progress Notes (Signed)
Called Dr. Bard Herbert to address rass goal. New rass goal received. Will continue to monitor.

## 2014-05-08 NOTE — Consult Note (Signed)
NEURO HOSPITALIST CONSULT NOTE    Reason for Consult: Prognostication  HPI:                                                                                                                                         Majority of history obtained from chart   Jonathon Galvan is an 68 y.o. male with extensive cardiac history who presents to the hospital after noticing that he fell out of his wheel chair. After being put back up in the wheel chair was noticed that he was not breathing. EMS was called. Patient was in VF. Shocked with 5 minutes of CPR. Patient was brought to the ED where he arrested again for 1 minutes and once paced patient normalized. Hypothermia protocol initiated on admission. LHC revealed nonobstructive CAD with moderate LV dysfunction.   Patein was rewarmed on 05/06/2014 at 0700 hours (50 hours ago) On 05/07/2014 pressures dropped 74/41 and remain in the 70's for about one hour. Cr has slowly been rising from 0.87 to 1.56 over last two days.    Recent Echo showed EF 30-35%  EEG pending Initial CT head on 05/04/2014 was normal.   Currently on Precedex Last does 0800 and Fentanyl   Past Medical History  Diagnosis Date  . CHF (congestive heart failure)     EF 30-35%, nonischemic  . CAD (coronary artery disease)   . Hyperlipidemia     hypertriglyceridemia on niaspan and lipitor.  . Asthma   . Allergy   . PVD (peripheral vascular disease)     s/p left AKA @ [facility] (MRSA septic arthritis)  . Diabetes mellitus     dx in 2009, DKA, came unresponsive  . Tobacco abuse   . Hepatitis C   . Hypertension   . LBBB (left bundle branch block)   . Anemia of chronic disease     baseline Hgb now 13  . Thrombocytopenia     07/2007, went down to 90,000. likely 2/2 neurontin.  . Cocaine use     s/p cardiopulmonary arrest 12/25/2002 ([facility])  . Cholelithiasis     documented on CT abdomen in 07/2007    Past Surgical History  Procedure Laterality  Date  . Left leg mrsa infection s/p aka    . Left shoulder cyst removal      Family History  Problem Relation Age of Onset  . Coronary artery disease Mother     died in her 47's  . Cirrhosis Father     died from complications  . Coronary artery disease Brother     died of fatal MI    Social History:  reports that he has been smoking.  He does not have any smokeless tobacco history on file. He reports that he does not drink  alcohol or use illicit drugs.  No Known Allergies  MEDICATIONS:                                                                                                                     Scheduled: . ampicillin-sulbactam (UNASYN) IV  3 g Intravenous Q6H  . antiseptic oral rinse  7 mL Mouth Rinse QID  . carvedilol  12.5 mg Oral BID WC  . chlorhexidine  15 mL Mouth Rinse BID  . furosemide  40 mg Intravenous Q6H  . heparin subcutaneous  5,000 Units Subcutaneous 3 times per day  . insulin aspart  0-15 Units Subcutaneous 6 times per day  . isosorbide-hydrALAZINE  1 tablet Oral BID  . nicotine  21 mg Transdermal Daily  . pantoprazole (PROTONIX) IV  40 mg Intravenous QHS  . sodium chloride  10-40 mL Intracatheter Q12H  . sodium chloride  3 mL Intravenous Q12H   Continuous: . dexmedetomidine 0.3 mcg/kg/hr (05/08/14 0800)  . feeding supplement (VITAL AF 1.2 CAL) 65 mL/hr (05/07/14 1923)  . fentaNYL infusion INTRAVENOUS 125 mcg/hr (05/08/14 0800)   VZD:GLOVFI chloride, sodium chloride, docusate **OR** docusate, fentaNYL, hydrALAZINE, ondansetron (ZOFRAN) IV, sodium chloride, sodium chloride   ROS:                                                                                                                                       History obtained from unobtainable from patient due to mental status    Blood pressure 113/49, pulse 64, temperature 98.5 F (36.9 C), temperature source Oral, resp. rate 0, height 6' (1.829 m), weight 88.81 kg (195 lb 12.7 oz), SpO2 100  %.  Neurologic Examination:                                                                                                      Mental Status: Patient does not respond to verbal stimuli.  Lifts arms to deep sternal rub.  Does not follow commands.  No verbalizations noted.  Intubated and breathing over the Vent Cranial Nerves: II: patient does not respond confrontation bilaterally, pupils right 3 mm, left 2 mm,and reactive bilaterally III,IV,VI: doll's response present bilaterally.  V,VII: corneal reflex present bilaterally  VIII: patient does not respond to verbal stimuli IX,X: gag reflex present, XI: trapezius strength unable to test bilaterally XII: tongue strength unable to test Motor: Moving all extremities quasi-purposefully.  Sensory: As above  Deep Tendon Reflexes:  2+ bilateral UE and no KJ or AJ noted  Plantars: absent on the right Cerebellar: Unable to perform    Lab Results: Basic Metabolic Panel:  Recent Labs Lab 05/04/14 2000  05/05/14 0140 05/05/14 0600  05/06/14 0630 05/06/14 1000 05/06/14 2219 05/07/14 0417 05/08/14 0243  NA 140  < > 143 143  < > 145 139 138 142 145  K 3.4*  < > 2.8* 2.8*  < > 4.2 4.8 4.9 4.3 4.0  CL 103  < > 105 106  < > 106 103 104 105 109  CO2 22  < > 23 21  < > 23 24 23 24 21   GLUCOSE 185*  < > 233* 209*  < > 116* 151* 130* 125* 105*  BUN 14  < > 15 15  < > 12 13 18 19 22   CREATININE 1.34  < > 1.26 1.22  < > 0.61 0.87 1.61* 1.54* 1.56*  CALCIUM 9.5  < > 8.8 8.9  < > 8.2* 8.0* 8.2* 8.5 8.1*  MG 1.8  --  2.3 2.1  --   --   --   --   --   --   PHOS  --   --   --  2.4  --   --   --   --   --   --   < > = values in this interval not displayed.  Liver Function Tests:  Recent Labs Lab 05/04/14 1655 05/07/14 0417 05/08/14 0243  AST 42* 37 29  ALT 27 26 19   ALKPHOS 84 93 67  BILITOT 0.6 0.5 0.6  PROT 6.8 6.3 5.7*  ALBUMIN 3.1* 2.7* 2.1*   No results for input(s): LIPASE, AMYLASE in the last 168 hours. No results for  input(s): AMMONIA in the last 168 hours.  CBC:  Recent Labs Lab 05/04/14 1655  05/05/14 0757  05/05/14 1435 05/05/14 1812 05/06/14 0630 05/07/14 0417 05/08/14 0243  WBC 20.6*  --  17.1*  --   --   --  17.4* 19.6* 16.3*  NEUTROABS 11.6*  --   --   --   --   --   --  15.2* 12.7*  HGB 14.0  < > 13.9  < > 15.6 15.3 13.8 14.0 11.7*  HCT 42.7  < > 41.1  < > 46.0 45.0 41.0 42.0 35.7*  MCV 84.2  --  79.5  --   --   --  79.9 80.9 82.4  PLT 232  --  220  --   --   --  202 202 160  < > = values in this interval not displayed.  Cardiac Enzymes:  Recent Labs Lab 05/05/14 0005 05/05/14 0600  TROPONINI 0.76* 0.47*    Lipid Panel: No results for input(s): CHOL, TRIG, HDL, CHOLHDL, VLDL, LDLCALC in the last 168 hours.  CBG:  Recent Labs Lab 05/07/14 1612 05/07/14 2030 05/08/14 0030 05/08/14 0403 05/08/14 0728  GLUCAP 132* 83 106* 120* 115*    Microbiology: Results for orders placed or performed during the hospital encounter  of 05/04/14  Blood culture (routine x 2)     Status: None (Preliminary result)   Collection Time: 05/04/14  5:35 PM  Result Value Ref Range Status   Specimen Description BLOOD  Final   Special Requests   Final    BOTTLES DRAWN AEROBIC AND ANAEROBIC 5CC RT SUBCLAVIAN   Culture  Setup Time   Final    05/05/2014 01:30 Performed at [facility]    Culture   Final           BLOOD CULTURE RECEIVED NO GROWTH TO DATE CULTURE WILL BE HELD FOR 5 DAYS BEFORE ISSUING A FINAL NEGATIVE REPORT Performed at [facility]    Report Status PENDING  Incomplete  MRSA PCR Screening     Status: None   Collection Time: 05/04/14  7:39 PM  Result Value Ref Range Status   MRSA by PCR NEGATIVE NEGATIVE Final    Comment:        The GeneXpert MRSA Assay (FDA approved for NASAL specimens only), is one component of a comprehensive MRSA colonization surveillance program. It is not intended to diagnose MRSA infection nor to guide or monitor treatment  for MRSA infections.   Blood culture (routine x 2)     Status: None (Preliminary result)   Collection Time: 05/04/14  9:00 PM  Result Value Ref Range Status   Specimen Description BLOOD LEFT ARM  Final   Special Requests BOTTLES DRAWN AEROBIC ONLY 2CC  Final   Culture  Setup Time   Final    05/05/2014 04:08 Performed at [facility]    Culture   Final           BLOOD CULTURE RECEIVED NO GROWTH TO DATE CULTURE WILL BE HELD FOR 5 DAYS BEFORE ISSUING A FINAL NEGATIVE REPORT Performed at [facility]    Report Status PENDING  Incomplete    Coagulation Studies: No results for input(s): LABPROT, INR in the last 72 hours.  Imaging: Dg Chest Port 1 View  05/08/2014   CLINICAL DATA:  Ventilator dependent respiratory failure. Acute respiratory acidosis. Shortness of breath. Prior history of CHF.  EXAM: PORTABLE CHEST - 1 VIEW  COMPARISON:  Portable chest x-rays yesterday dating back to 05/04/2014.  FINDINGS: Endotracheal tube tip in satisfactory position projecting approximately 3 cm above the carina. Nasogastric tube courses below the diaphragm into the stomach. Cardiac silhouette moderately to markedly enlarged but stable. Interval development of mild-to-moderate diffuse interstitial and airspace pulmonary edema since yesterday. Developing bilateral pleural effusions and associated dense passive atelectasis in the lower lobes.  IMPRESSION: 1. Support apparatus satisfactory. 2. New moderate CHF, with interstitial and airspace pulmonary edema and moderate-sized bilateral pleural effusions. Associated passive atelectasis in the lower lobes.   Electronically Signed   By: Hulan Saashomas  Lawrence M.D.   On: 05/08/2014 07:43   Dg Chest Port 1 View  05/07/2014   CLINICAL DATA:  Cardiac arrest, history of asthma  EXAM: PORTABLE CHEST - 1 VIEW  COMPARISON:  05/06/2014  FINDINGS: Cardiomediastinal silhouette is stable. Endotracheal tube in place with tip 4.5 cm above the carina. Stable NG tube  position. Mild basilar atelectasis again noted. No segmental infiltrate or pulmonary edema  IMPRESSION: Stable support apparatus. Again noted mild basilar atelectasis. No segmental infiltrate or pulmonary edema.   Electronically Signed   By: Natasha MeadLiviu  Pop M.D.   On: 05/07/2014 11:22   Assessment and plan per attending neurologist  Felicie Mornavid Smith PA-C [facility] [phone]  05/08/2014, 9:34 AM  I have seen and evaluated the patient. I have reviewed the above note and made appropriate changes.      Assessment/Plan:  68 yo M with a history of cardiac arrest with post-arrest encephalopathy. It is likely that he has had some degree of injury given his persistent confusion, however will need to give more time given that he does have some confounding factors(? Drug withdrawal) and also no factors clearly predictive of poor outcome at this time. Rewarmed evening of 11/13, likely will need to observe for at least one week post-rewarming prior to discussing prognosis.   1) EEG today 2) will continue to follow.    Ritta Slot, MD [facility] [phone]  If 7pm- 7am, please page neurology on call as listed in AMION.

## 2014-05-08 NOTE — Procedures (Signed)
Central Venous Catheter Insertion Procedure Note Jonathon Galvan [id number] Feb 11, 1946  Procedure: Insertion of Central Venous Catheter Indications: Drug and/or fluid administration  Procedure Details Consent: Risks of procedure as well as the alternatives and risks of each were explained to the (patient/caregiver).  Consent for procedure obtained. Time Out: Verified patient identification, verified procedure, site/side was marked, verified correct patient position, special equipment/implants available, medications/allergies/relevent history reviewed, required imaging and test results available.  Performed  Cervical collar was removed and cervical spine was stabilized by bedside nurse at head of bead throughout entirety of procedure.  Maximum sterile technique was used including antiseptics, cap, gloves, gown, hand hygiene, mask and sheet. Skin prep: Chlorhexidine; local anesthetic administered A antimicrobial bonded/coated triple lumen catheter was placed in the left internal jugular vein using the Seldinger technique. Ultrasound guidance used.Yes.   Catheter placed to 20 cm. Blood aspirated via all 3 ports and then flushed x 3. Line sutured x 2 and dressing applied.  Evaluation Blood flow good Complications: No apparent complications Patient did tolerate procedure well. Chest X-ray ordered to verify placement.  CXR: pending.  Jonathon Galvan, ACNP [facility] Pulmonology/Critical Care Pager [phone] or [phone]    [facility] [place]  Jonathon Galvan. Jonathon Alias, MD, FACP Pgr: [phone] [facility]

## 2014-05-08 NOTE — Progress Notes (Signed)
Unit RT called for me to place pt back on full vent support d/t pt agitated and tachypnea.  RN x3 in room holding pt down d/t agitation.  Placed pt back on full vent support, pt tol well, no resp distress presently noted.  HR 92, sat 97%

## 2014-05-08 NOTE — Progress Notes (Signed)
   05/08/14 1500  Clinical Encounter Type  Visited With Patient and family together  Visit Type Follow-up;Spiritual support  Referral From Nurse  Spiritual Encounters  Spiritual Needs Prayer  Stress Factors  Family Stress Factors Major life changes   Chaplain visited with patient and patient's family today. Chaplain was referred to patient via spiritual care consult. Patient's family were calm and not distressed at the moment. One of the patient's family members asked for prayer. Chaplain prayed with family. Chaplain will continue to provide emotional and spiritual support for patient and patient's family as needed. Cranston Neighbor, Chaplain  3:48 PM 3:48 PM

## 2014-05-08 NOTE — Progress Notes (Signed)
PULMONARY / CRITICAL CARE MEDICINE   Name: Juanda BondWilliam D Bound MRN: [id number] DOB: 04/16/1946    ADMISSION DATE:  05/04/2014  INITIAL PRESENTATION:  [age] M with extensive PMH inc NICM admitted after VF arrest. Hypothermia protocol initiated on admission. 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 LVEF approx 40-45% 11/12 Hypothermia protocol implemented   11/12 CT head: NAICP 11/12 CT neck: No cervical spine fracture or traumatic subluxation. Advanced multilevel cervical spondylosis is noted 11/14 echo>>>30% to 35%. Dyskinesis of the mid-apicalanteroseptal myocardium. Doppler parameters are consistent with abnormal left ventricular relaxation (grade 1 diastolic dysfunction). 11/16 eeg>>>  INDWELLING DEVICES: R radial A-line 11/12 >> 11/13 ETT 11/12 >>  R femoral CVL 11/12 >>  R femoral arterial sheath 11/12 >> 11/15  MICRO DATA: Blood 11/12 >>   ANTIMICROBIALS:  SUBJECTIVE:  Persistent agitation.  VITAL SIGNS: Temp:  [97 F (36.1 C)-100 F (37.8 C)] 98.5 F (36.9 C) (11/16 0700) Pulse Rate:  [62-102] 65 (11/16 0730) Resp:  [0-29] 26 (11/16 0730) BP: (73-144)/(39-69) 97/45 mmHg (11/16 0730) SpO2:  [94 %-100 %] 100 % (11/16 0730) Arterial Line BP: (137-188)/(75-98) 138/75 mmHg (11/15 1200) FiO2 (%):  [40 %] 40 % (11/16 0730) Weight:  [88.81 kg (195 lb 12.7 oz)] 88.81 kg (195 lb 12.7 oz) (11/16 0500)  HEMODYNAMICS: CVP:  [6 mmHg] 6 mmHg  VENTILATOR SETTINGS: Vent Mode:  [-] PSV;CPAP FiO2 (%):  [40 %] 40 % Set Rate:  [12 bmp] 12 bmp Vt Set:  [620 mL] 620 mL PEEP:  [5 cmH20] 5 cmH20 Pressure Support:  [8 cmH20-10 cmH20] 10 cmH20 Plateau Pressure:  [20 cmH20-23 cmH20] 20 cmH20  INTAKE / OUTPUT:  Intake/Output Summary (Last 24 hours) at 05/08/14 0844 Last data filed at 05/08/14 0700  Gross per 24 hour  Intake 2679.94 ml  Output   1390 ml   Net 1289.94 ml   PHYSICAL EXAMINATION: General: agitation  Neuro: per 3 mm, moves all ext equal not following commands HEENT: superficial scalp abrasion, C collar in place Cardiovascular:  s1 s2 RRR Lungs: ronchi reduced bases Abdomen: soft, diminished BS Ext:  L AKA, 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 Relevant abnormalities are discussed in the A/P section  WUJ:[id number]:unchanged   ASSESSMENT / PLAN:  PULMONARY A: VDRF post cardiac arrest P:   Wean cpap 5 ps 5, goal 4 hr Unable to extubate until agitation level and airway secretions improve Begin diureses today.  CARDIOVASCULAR A:  VF cardiac arrest Previously documented NICM Chronic LBBB Sinus bradycardia Ventricular ectopy Cardiogenic shock P:  Echo reviewed, int prom on pcxr, consider lasix Tele If awaken, will need icd  RENAL A:   CRI K rising and rewarmed atn from arrest improved P:   Chem in am KVO IVF Lasix 40 mg IV q6 hours x3 doses. Replace electrolytes as indicated.  GASTROINTESTINAL A:   Npo 11/14, hep c? P:   SUP: IV PPI Tf to goal Hep C Ab reactive. Ammonia level now, if elevated then will start lactulose.  HEMATOLOGIC A:   DVt prevention P:  DVT px: SQ heparin Monitor CBC intermittently Transfuse per usual ICU guidelines  INFECTIOUS A:   Number needed to harm hypothermia 12-14 PNA / sepsis At risk aspiration, normothermia blanket indicated fever P:   UA pCXR Unasyn Sputum  ENDOCRINE A:   DM 2 P:   Cont SSI  NEUROLOGIC A:   Post anoxic encephalopathy H/o cocaine, methadone (active?) P:   RASS goal: 0 Continue precedex Fentanyl drip WUA today. EEG ordered for Monday. Keep collar on until can clear clinicaly Neuro consult  FAMILY  - Updates: No family seen bedside.  TODAY'S SUMMARY: Wean sedation, active diureses, continue PS trials, will need EEG results inorder to prognosticate, will call neuro for prognostication.  Ccm time  35 min  Alyson Reedy, M.D. [facility] Pulmonary/Critical Care Medicine. Pager: [phone]. After hours pager: [phone].

## 2014-05-08 NOTE — Procedures (Signed)
ELECTROENCEPHALOGRAM REPORT  Patient: GRANTLEE SLANKARD       Room #: 2H11 EEG No. ID: [id number] Age: 68 y.o.        Sex: male Referring Physician: Claudette Head Report Date:  05/08/2014        Interpreting Physician: Aline Brochure  History: MARKEVIUS DIELEMAN is an 68 y.o. male status post cardiac arrest who has remained unresponsive following cooling protocol and subsequent rewarming.  Indications for study:  Assess severity of encephalopathy; rule out seizure activity.  Technique: This is an 18 channel routine scalp EEG performed at the bedside with bipolar and monopolar montages arranged in accordance to the international 10/20 system of electrode placement.   Description: patient was intubated and on mechanical ventilation at the time of this recording. Predominant cerebral activity consisted marked suppression of brain activity with low amplitude irregular diffuse delta activity with intermittent occurrences of rhythmic activity resembling sleep spindles recorded from the frontal and central regions. No clear burst-suppression pattern was seen. Photic stimulation was not performed. No epileptiform discharges were recorded.  Interpretation: this EEG is abnormal with severe continuous generalized slowing of cerebral activity. They pattern of slowing was nonspecific, but consistent with patient's history ofhypoxic encephalopathy. Sedating medication may be a contributing factor as well. No evidence of an epileptiform abnormality was seen.   Venetia Maxon M.D. [facility] [phone]

## 2014-05-08 NOTE — Progress Notes (Signed)
Pt extremely agitated. Pt pulled out PIV. 4 RNs required to maintain pt safety. Called Dr. Bard Herbert. Received sedation orders and Neo. Will continue to monitor.

## 2014-05-08 NOTE — Progress Notes (Signed)
Bedside EEG completed, results pending. 

## 2014-05-09 ENCOUNTER — Inpatient Hospital Stay (HOSPITAL_COMMUNITY): Payer: PRIVATE HEALTH INSURANCE

## 2014-05-09 DIAGNOSIS — G931 Anoxic brain damage, not elsewhere classified: Secondary | ICD-10-CM | POA: Diagnosis present

## 2014-05-09 DIAGNOSIS — G936 Cerebral edema: Secondary | ICD-10-CM

## 2014-05-09 LAB — BLOOD GAS, ARTERIAL
ACID-BASE DEFICIT: 0.9 mmol/L (ref 0.0–2.0)
Bicarbonate: 24.4 mEq/L — ABNORMAL HIGH (ref 20.0–24.0)
Drawn by: 41977
FIO2: 0.4 %
LHR: 12 {breaths}/min
O2 SAT: 98 %
PEEP: 5 cmH2O
Patient temperature: 98.6
TCO2: 25.8 mmol/L (ref 0–100)
VT: 620 mL
pCO2 arterial: 48.3 mmHg — ABNORMAL HIGH (ref 35.0–45.0)
pH, Arterial: 7.323 — ABNORMAL LOW (ref 7.350–7.450)
pO2, Arterial: 119 mmHg — ABNORMAL HIGH (ref 80.0–100.0)

## 2014-05-09 LAB — BASIC METABOLIC PANEL
Anion gap: 11 (ref 5–15)
BUN: 25 mg/dL — AB (ref 6–23)
CHLORIDE: 111 meq/L (ref 96–112)
CO2: 27 meq/L (ref 19–32)
CREATININE: 1.92 mg/dL — AB (ref 0.50–1.35)
Calcium: 8.3 mg/dL — ABNORMAL LOW (ref 8.4–10.5)
GFR calc Af Amer: 39 mL/min — ABNORMAL LOW (ref >90)
GFR calc non Af Amer: 34 mL/min — ABNORMAL LOW (ref >90)
GLUCOSE: 129 mg/dL — AB (ref 70–99)
POTASSIUM: 3.1 meq/L — AB (ref 3.7–5.3)
Sodium: 149 mEq/L — ABNORMAL HIGH (ref 137–147)

## 2014-05-09 LAB — GLUCOSE, CAPILLARY
GLUCOSE-CAPILLARY: 139 mg/dL — AB (ref 70–99)
GLUCOSE-CAPILLARY: 152 mg/dL — AB (ref 70–99)
Glucose-Capillary: 132 mg/dL — ABNORMAL HIGH (ref 70–99)
Glucose-Capillary: 131 mg/dL — ABNORMAL HIGH (ref 70–99)
Glucose-Capillary: 124 mg/dL — ABNORMAL HIGH (ref 70–99)
Glucose-Capillary: 120 mg/dL — ABNORMAL HIGH (ref 70–99)

## 2014-05-09 LAB — CBC
HCT: 33.6 % — ABNORMAL LOW (ref 39.0–52.0)
HEMOGLOBIN: 10.8 g/dL — AB (ref 13.0–17.0)
MCH: 25.8 pg — ABNORMAL LOW (ref 26.0–34.0)
MCHC: 32.1 g/dL (ref 30.0–36.0)
MCV: 80.4 fL (ref 78.0–100.0)
Platelets: 177 10*3/uL (ref 150–400)
RBC: 4.18 MIL/uL — ABNORMAL LOW (ref 4.22–5.81)
RDW: 15.5 % (ref 11.5–15.5)
WBC: 12.3 10*3/uL — AB (ref 4.0–10.5)

## 2014-05-09 LAB — PHOSPHORUS: PHOSPHORUS: 3 mg/dL (ref 2.3–4.6)

## 2014-05-09 LAB — HEPATITIS B SURFACE ANTIBODY,QUALITATIVE: HEP B S AB: NEGATIVE

## 2014-05-09 LAB — HEMOGLOBIN A1C
Hgb A1c MFr Bld: 6.9 % — ABNORMAL HIGH (ref <5.7)
Mean Plasma Glucose: 151 mg/dL — ABNORMAL HIGH (ref <117)

## 2014-05-09 LAB — MAGNESIUM: Magnesium: 2.2 mg/dL (ref 1.5–2.5)

## 2014-05-09 LAB — PROCALCITONIN: Procalcitonin: 1.71 ng/mL

## 2014-05-09 LAB — TRIGLYCERIDES: Triglycerides: 120 mg/dL (ref <150)

## 2014-05-09 MED ORDER — MIDAZOLAM HCL 2 MG/2ML IJ SOLN
INTRAMUSCULAR | Status: AC
  Administered 2014-05-09: 2 mg
  Filled 2014-05-09: qty 2

## 2014-05-09 MED ORDER — FREE WATER
200.0000 mL | Freq: Three times a day (TID) | Status: DC
  Administered 2014-05-09 – 2014-05-10 (×4): 200 mL

## 2014-05-09 MED ORDER — POTASSIUM CHLORIDE 20 MEQ/15ML (10%) PO SOLN
40.0000 meq | ORAL | Status: AC
  Administered 2014-05-09: 40 meq
  Filled 2014-05-09: qty 30

## 2014-05-09 MED ORDER — PANTOPRAZOLE SODIUM 40 MG PO PACK
40.0000 mg | PACK | Freq: Every day | ORAL | Status: DC
  Administered 2014-05-09 – 2014-05-13 (×5): 40 mg
  Filled 2014-05-09 (×6): qty 20

## 2014-05-09 MED ORDER — PROPOFOL 10 MG/ML IV EMUL
INTRAVENOUS | Status: AC
  Filled 2014-05-09: qty 100

## 2014-05-09 MED ORDER — PROPOFOL 10 MG/ML IV EMUL
0.0000 ug/kg/min | INTRAVENOUS | Status: DC
  Administered 2014-05-09: 15 ug/kg/min via INTRAVENOUS
  Administered 2014-05-09: 20 ug/kg/min via INTRAVENOUS
  Administered 2014-05-10: 30 ug/kg/min via INTRAVENOUS
  Administered 2014-05-10: 20 ug/kg/min via INTRAVENOUS
  Administered 2014-05-10 (×3): 30 ug/kg/min via INTRAVENOUS
  Administered 2014-05-11: 40 ug/kg/min via INTRAVENOUS
  Administered 2014-05-11: 20 ug/kg/min via INTRAVENOUS
  Filled 2014-05-09 (×8): qty 100

## 2014-05-09 MED ORDER — CEFTRIAXONE SODIUM IN DEXTROSE 20 MG/ML IV SOLN
1.0000 g | INTRAVENOUS | Status: DC
  Administered 2014-05-09 – 2014-05-10 (×2): 1 g via INTRAVENOUS
  Filled 2014-05-09 (×3): qty 50

## 2014-05-09 MED ORDER — ACETAMINOPHEN 160 MG/5ML PO SOLN
650.0000 mg | Freq: Four times a day (QID) | ORAL | Status: DC | PRN
  Administered 2014-05-09 (×2): 650 mg
  Filled 2014-05-09 (×2): qty 20.3

## 2014-05-09 MED ORDER — QUETIAPINE FUMARATE 100 MG PO TABS
100.0000 mg | ORAL_TABLET | Freq: Two times a day (BID) | ORAL | Status: DC
  Administered 2014-05-09 – 2014-05-11 (×5): 100 mg
  Filled 2014-05-09 (×6): qty 1

## 2014-05-09 MED ORDER — CLONAZEPAM 0.1 MG/ML ORAL SUSPENSION
1.0000 mg | Freq: Two times a day (BID) | ORAL | Status: DC
  Filled 2014-05-09 (×3): qty 10

## 2014-05-09 MED ORDER — MIDAZOLAM HCL 2 MG/2ML IJ SOLN
2.0000 mg | Freq: Once | INTRAMUSCULAR | Status: DC
  Filled 2014-05-09: qty 2

## 2014-05-09 MED ORDER — CLONAZEPAM 1 MG PO TABS
1.0000 mg | ORAL_TABLET | Freq: Two times a day (BID) | ORAL | Status: DC
  Administered 2014-05-09 – 2014-05-12 (×7): 1 mg via ORAL
  Filled 2014-05-09 (×7): qty 1

## 2014-05-09 MED ORDER — LACTULOSE 10 GM/15ML PO SOLN
30.0000 g | Freq: Two times a day (BID) | ORAL | Status: DC
  Administered 2014-05-09 – 2014-05-12 (×7): 30 g
  Filled 2014-05-09 (×9): qty 45

## 2014-05-09 NOTE — Progress Notes (Signed)
Transported pt to and from CT on a ventilator. Pt stable throughout with no complications. Pt placed back on full support in CT due to agitation. Pt returned to [facility].

## 2014-05-09 NOTE — Progress Notes (Signed)
NUTRITION FOLLOW-UP  INTERVENTION: Vital 1.2 @ 65 ml/hr via OGT   Tube feeding regimen provides 1872 kcal (99% of energy, 117 grams of protein, and  ml of H2O.    NUTRITION DIAGNOSIS: Inadequate oral intake related to inability to eat as evidenced by NPO status; ongoing.    Goal: Pt to meet >/= 90% of their estimated nutrition needs; met.      Monitor: changes in respiratory status, nutrition support labs and weights  ASSESSMENT: Pt admitted following VF arrest . Hypothermia protocol implemented.  Patient is currently intubated on ventilator support MV: 13.1 L/min Temp (24hrs), Avg:97.6 F (36.4 C), Min:96.6 F (35.9 C), Max:100 F (37.8 C)  Per MD note pt remains very agitated when off medications for WUA and continues to have copious secretions which are inhibiting pt's ability to extubate.  Ammonia elevated, lactulose started.   Height: Ht Readings from Last 1 Encounters:  05/04/14 6' (1.829 m)    Weight: Wt Readings from Last 1 Encounters:  05/09/14 197 lb 15.6 oz (89.8 kg)  Admission weight 192 lb (87.5 kg) 11/12 Adjusted IBW: 74.4 kg  BMI:  28.3 - overweight  Estimated Nutritional Needs: Kcal: 1883 Protein: 115-130 grams Fluid: 1.9 liters daily  Skin:  Head abrasion Left lower leg amputation (right AKA) Ecchymosis on head and abdomen Skin tear left hand  Diet Order:     Intake/Output Summary (Last 24 hours) at 05/09/14 1057 Last data filed at 05/09/14 0915  Gross per 24 hour  Intake 2703.48 ml  Output   3150 ml  Net -446.52 ml    Last BM:  PTA  Labs:   Recent Labs Lab 05/05/14 0140 05/05/14 0600  05/07/14 0417 05/08/14 0243 05/09/14 0500  NA 143 143  < > 142 145 149*  K 2.8* 2.8*  < > 4.3 4.0 3.1*  CL 105 106  < > 105 109 111  CO2 23 21  < > _0 BUN 15 15  < > 19 22 25*  CREATININE 1.26 1.22  < > 1.54* 1.56* 1.92*  CALCIUM 8.8 8.9  < > 8.5 8.1* 8.3*  MG 2.3 2.1  --   --   --  2.2  PHOS  --  2.4  --   --   --  3.0  GLUCOSE  233* 209*  < > 125* 105* 129*  < > = values in this interval not displayed.  CBG (last 3)   Recent Labs  05/09/14 0050 05/09/14 0417 05/09/14 0725  GLUCAP 132* 131* 139*    Scheduled Meds: . ampicillin-sulbactam (UNASYN) IV  3 g Intravenous Q6H  . antiseptic oral rinse  7 mL Mouth Rinse QID  . atorvastatin  10 mg Oral q1800  . carvedilol  12.5 mg Oral BID WC  . chlorhexidine  15 mL Mouth Rinse BID  . clonazePAM  1 mg Oral BID  . free water  200 mL Per Tube 3 times per day  . heparin subcutaneous  5,000 Units Subcutaneous 3 times per day  . insulin aspart  0-15 Units Subcutaneous 6 times per day  . isosorbide-hydrALAZINE  1 tablet Oral BID  . lactulose  30 g Per Tube BID  . pantoprazole sodium  40 mg Per Tube Q1200  . potassium chloride  40 mEq Per Tube Q4H  . QUEtiapine  100 mg Per Tube BID    Continuous Infusions: . feeding supplement (VITAL AF 1.2 CAL) 1,000 mL (05/08/14 1430)  . fentaNYL infusion INTRAVENOUS 250  mcg/hr (05/09/14 1042)  . propofol 15 mcg/kg/min (05/09/14 0903)   [place], [place], [place] Pager [phone] After Hours Pager

## 2014-05-09 NOTE — Progress Notes (Signed)
SUBJECTIVE:  Pt seen and examined in AM.  Yesterday he received IV lasix 40 mg x 3 doses with resulting hypotension. He was not started on pressor support. He is still intubated and this morning with agitation after weaning trial. Telemetry with NSR, SB, and BBB (chronic). His EEG was consistent with hypoxic injury s/p cardiac arrest.     OBJECTIVE:   Vitals:   Filed Vitals:   05/09/14 0800 05/09/14 0830 05/09/14 0900 05/09/14 0923  BP: 116/48 153/70 157/77 122/48  Pulse: 57 68 80 64  Temp: 97.7 F (36.5 C) 98.1 F (36.7 C) 99.1 F (37.3 C)   TempSrc: Core (Comment)     Resp: 15 18 24 14   Height:      Weight:      SpO2: 96% 98% 99% 97%   I&O's:    Intake/Output Summary (Last 24 hours) at 05/09/14 1056 Last data filed at 05/09/14 0915  Gross per 24 hour  Intake 2703.48 ml  Output   3150 ml  Net -446.52 ml   TELEMETRY: Reviewed telemetry pt in BBB and SB:     PHYSICAL EXAM General: Intubated and very agitated Head:   Large abrasion/contusion on right side. C-collar in place.   Lungs:  Intubated. Ronchi in anterior fields. Heart:  HRRR S1 S2  No JVD.   Abdomen: Abdomen soft and non-tender Msk:  Left AKA   Extremities:  SCD's. No edema of RLE. Neuro: Semi-alert and agitated  Psych:  Agitated Skin: No rash   LABS: Basic Metabolic Panel:  Recent Labs  16/10/96 0243 05/09/14 0500  NA 145 149*  K 4.0 3.1*  CL 109 111  CO2 21 27  GLUCOSE 105* 129*  BUN 22 25*  CREATININE 1.56* 1.92*  CALCIUM 8.1* 8.3*  MG  --  2.2  PHOS  --  3.0   Liver Function Tests:  Recent Labs  05/07/14 0417 05/08/14 0243  AST 37 29  ALT 26 19  ALKPHOS 93 67  BILITOT 0.5 0.6  PROT 6.3 5.7*  ALBUMIN 2.7* 2.1*   No results for input(s): LIPASE, AMYLASE in the last 72 hours. CBC:  Recent Labs  05/07/14 0417 05/08/14 0243 05/09/14 0500  WBC 19.6* 16.3* 12.3*  NEUTROABS 15.2* 12.7*  --   HGB 14.0 11.7* 10.8*  HCT 42.0 35.7* 33.6*  MCV 80.9 82.4 80.4  PLT 202 160  177   Cardiac Enzymes: No results for input(s): CKTOTAL, CKMB, CKMBINDEX, TROPONINI in the last 72 hours. BNP: Invalid input(s): POCBNP D-Dimer: No results for input(s): DDIMER in the last 72 hours. Hemoglobin A1C: No results for input(s): HGBA1C in the last 72 hours. Fasting Lipid Panel: No results for input(s): CHOL, HDL, LDLCALC, TRIG, CHOLHDL, LDLDIRECT in the last 72 hours. Thyroid Function Tests: No results for input(s): TSH, T4TOTAL, T3FREE, THYROIDAB in the last 72 hours.  Invalid input(s): FREET3 Anemia Panel: No results for input(s): VITAMINB12, FOLATE, FERRITIN, TIBC, IRON, RETICCTPCT in the last 72 hours. Coag Panel:   Lab Results  Component Value Date   INR 1.23 05/05/2014   INR 1.23 05/04/2014   INR 1.10 06/20/2010    RADIOLOGY: Ct Head Wo Contrast  05/04/2014   CLINICAL DATA:  Syncopal episode. Patient found down. Out of hospital cardiac arrest.  EXAM: CT HEAD WITHOUT CONTRAST  CT CERVICAL SPINE WITHOUT CONTRAST  TECHNIQUE: Multidetector CT imaging of the head and cervical spine was performed following the standard protocol without intravenous contrast. Multiplanar CT image reconstructions of the cervical spine  were also generated.  COMPARISON:  Prior CT head from 08/10/2007.  FINDINGS: CT HEAD FINDINGS  No evidence for acute infarction, hemorrhage, mass lesion, hydrocephalus, or extra-axial fluid. Mild cerebral and cerebellar atrophy. Hypoattenuation of white matter is consistent with chronic microvascular ischemic change.  The calvarium is intact. There is no sinus or mastoid air fluid level although the ethmoids are opacified. Likely post intubation. The patient is intubated, with accompanying nasopharyngeal fluid.  CT CERVICAL SPINE FINDINGS  There is no visible cervical spine fracture, traumatic subluxation, prevertebral soft tissue swelling, or intraspinal hematoma. Mild straightening of the normal cervical lordosis. Mild ossification of the posterior longitudinal  ligament. Multilevel disc space narrowing from C2 through T1. Moderately advanced facet arthropathy at C2-3 on the LEFT. Vascular calcification. Central venous line. Endotracheal tube. No pneumothorax. No worrisome neck mass.  IMPRESSION: Chronic changes as described. Similar appearance to 2009. No acute intracranial findings.  No cervical spine fracture or traumatic subluxation. Advanced multilevel cervical spondylosis is noted.  Visualized support tubes and lines appear appropriately placed.  Findings discussed with consulting MD.   Electronically Signed   By: Davonna Belling M.D.   On: 05/04/2014 18:26   Ct Cervical Spine Wo Contrast  05/04/2014   CLINICAL DATA:  Syncopal episode. Patient found down. Out of hospital cardiac arrest.  EXAM: CT HEAD WITHOUT CONTRAST  CT CERVICAL SPINE WITHOUT CONTRAST  TECHNIQUE: Multidetector CT imaging of the head and cervical spine was performed following the standard protocol without intravenous contrast. Multiplanar CT image reconstructions of the cervical spine were also generated.  COMPARISON:  Prior CT head from 08/10/2007.  FINDINGS: CT HEAD FINDINGS  No evidence for acute infarction, hemorrhage, mass lesion, hydrocephalus, or extra-axial fluid. Mild cerebral and cerebellar atrophy. Hypoattenuation of white matter is consistent with chronic microvascular ischemic change.  The calvarium is intact. There is no sinus or mastoid air fluid level although the ethmoids are opacified. Likely post intubation. The patient is intubated, with accompanying nasopharyngeal fluid.  CT CERVICAL SPINE FINDINGS  There is no visible cervical spine fracture, traumatic subluxation, prevertebral soft tissue swelling, or intraspinal hematoma. Mild straightening of the normal cervical lordosis. Mild ossification of the posterior longitudinal ligament. Multilevel disc space narrowing from C2 through T1. Moderately advanced facet arthropathy at C2-3 on the LEFT. Vascular calcification. Central  venous line. Endotracheal tube. No pneumothorax. No worrisome neck mass.  IMPRESSION: Chronic changes as described. Similar appearance to 2009. No acute intracranial findings.  No cervical spine fracture or traumatic subluxation. Advanced multilevel cervical spondylosis is noted.  Visualized support tubes and lines appear appropriately placed.  Findings discussed with consulting MD.   Electronically Signed   By: Davonna Belling M.D.   On: 05/04/2014 18:26   Dg Chest Port 1 View  05/09/2014   CLINICAL DATA:  Assess ET tube position.  EXAM: PORTABLE CHEST - 1 VIEW  COMPARISON:  05/08/2014  FINDINGS: The endotracheal tube is 5.3 cm above the carina. The NG tube is stable. The left IJ catheter is stable. Persistent cardiac enlargement and vascular congestion. Small pleural effusions and bibasilar atelectasis.  IMPRESSION: Stable support apparatus.  Persistent vascular congestion, mild edema, small effusions and bibasilar atelectasis.   Electronically Signed   By: Loralie Champagne M.D.   On: 05/09/2014 07:40   Dg Chest Port 1 View  05/08/2014   CLINICAL DATA:  68 year old male status post central line placement.  EXAM: PORTABLE CHEST - 1 VIEW  COMPARISON:  05/08/2014, 5:08 a.m.  FINDINGS: Cardiomediastinal silhouette unchanged  in size and contour. Fullness of the central vasculature again evident.  Diffuse interstitial and airspace opacities. Improved aeration at the bases of the lungs.  Interval placement of left IJ central venous catheter, which terminates in the region of the left brachycephalic vein and superior vena cava confluence. No evidence of pneumothorax.  Gastric tube again projects over the mediastinum, terminating out of the field of view. The gastric port projects over the region of the stomach.  Endotracheal tube is unchanged terminating suitably above the carina.  IMPRESSION: Interval placement of left IJ central venous catheter, which terminates at the confluence of the superior vena cava and left  brachycephalic vein. No evidence of pneumothorax.  Otherwise unchanged support apparatus.  Similar appearance of mixed interstitial and airspace disease, compatible with edema.  Improved aeration at the bilateral lung bases.  Signed,  Yvone Neu. Loreta Ave, DO  [facility]  [facility]   Electronically Signed   By: Gilmer Mor D.O.   On: 05/08/2014 17:30   Dg Chest Port 1 View  05/08/2014   CLINICAL DATA:  Ventilator dependent respiratory failure. Acute respiratory acidosis. Shortness of breath. Prior history of CHF.  EXAM: PORTABLE CHEST - 1 VIEW  COMPARISON:  Portable chest x-rays yesterday dating back to 05/04/2014.  FINDINGS: Endotracheal tube tip in satisfactory position projecting approximately 3 cm above the carina. Nasogastric tube courses below the diaphragm into the stomach. Cardiac silhouette moderately to markedly enlarged but stable. Interval development of mild-to-moderate diffuse interstitial and airspace pulmonary edema since yesterday. Developing bilateral pleural effusions and associated dense passive atelectasis in the lower lobes.  IMPRESSION: 1. Support apparatus satisfactory. 2. New moderate CHF, with interstitial and airspace pulmonary edema and moderate-sized bilateral pleural effusions. Associated passive atelectasis in the lower lobes.   Electronically Signed   By: Hulan Saas M.D.   On: 05/08/2014 07:43   Dg Chest Port 1 View  05/07/2014   CLINICAL DATA:  Cardiac arrest, history of asthma  EXAM: PORTABLE CHEST - 1 VIEW  COMPARISON:  05/06/2014  FINDINGS: Cardiomediastinal silhouette is stable. Endotracheal tube in place with tip 4.5 cm above the carina. Stable NG tube position. Mild basilar atelectasis again noted. No segmental infiltrate or pulmonary edema  IMPRESSION: Stable support apparatus. Again noted mild basilar atelectasis. No segmental infiltrate or pulmonary edema.   Electronically Signed   By: Natasha Mead M.D.   On:  05/07/2014 11:22   Dg Chest Port 1 View  05/06/2014   CLINICAL DATA:  Respiratory failure  EXAM: PORTABLE CHEST - 1 VIEW  COMPARISON:  05/05/1949  FINDINGS: An endotracheal tube is again noted 2.8 cm above the carina. A nasogastric catheter is noted within the stomach. The cardiac shadow remains enlarged. Mild bibasilar atelectatic changes are seen. No significant vascular congestion is noted.  IMPRESSION: Mild bibasilar atelectatic changes.  Tubes and lines as described.   Electronically Signed   By: Alcide Clever M.D.   On: 05/06/2014 07:22   Dg Chest Port 1 View  05/05/2014   CLINICAL DATA:  Evaluate airspace disease  EXAM: PORTABLE CHEST - 1 VIEW  COMPARISON:  05/04/2014  FINDINGS: Endotracheal tube tip between the clavicular heads and carina. A gastric suction tube enters the stomach. Right subclavian central line, malpositioned on the previous study, has been removed.  Unchanged cardiomegaly.  Stable aortic contours.  Pulmonary venous congestion has decreased but there is now hazy and streaky bibasilar opacities. No pneumothorax.  IMPRESSION: 1. New orogastric tube is in good  position. 2. Improved pulmonary edema. 3. Worsening basilar aeration, likely layering small effusions and atelectasis. Pneumonia cannot be excluded.   Electronically Signed   By: Tiburcio Pea M.D.   On: 05/05/2014 08:00   Dg Chest Portable 1 View  05/04/2014   CLINICAL DATA:  Status post cardiac arrest and CPR.  Intubated.  EXAM: PORTABLE CHEST - 1 VIEW  COMPARISON:  06/13/2010.  FINDINGS: Interval endotracheal tube with its tip 3.6 cm above the carina. Breast of enlargement of the cardiac silhouette with increased prominence of the pulmonary vasculature and interstitial markings. No pleural fluid seen. No pneumothorax. An interval epicardial pacemaker lead is in place. Right subclavian catheter extending into the right neck, its tip not included.  IMPRESSION: 1. Malpositioned right subclavian catheter extending into the  neck on the right. 2. Progressive cardiomegaly and changes of congestive heart failure. These results will be called to the ordering clinician or representative by the Radiologist Assistant, and communication documented in the PACS or zVision Dashboard.   Electronically Signed   By: Gordan Payment M.D.   On: 05/04/2014 17:47   Dg Abd Portable 1v  05/04/2014   CLINICAL DATA:  Orogastric tube placement.  EXAM: PORTABLE ABDOMEN - 1 VIEW  COMPARISON:  08/11/2007.  FINDINGS: Orogastric tube tip in the mid to distal stomach. The visualized bowel gas pattern is normal. The patient is rotated to the right. Possible hiatal hernia and right basilar airspace opacity.  IMPRESSION: 1. Orogastric tube tip in the mid to distal stomach. 2. Possible hiatal hernia. 3. Mild patchy atelectasis or pneumonia at the right lung base.   Electronically Signed   By: Gordan Payment M.D.   On: 05/04/2014 21:39   Principal Problem:   Cardiac arrest Active Problems:   Chronic hepatitis C   DM2 (diabetes mellitus, type 2)   Essential hypertension   Peripheral vascular disease   VF (ventricular fibrillation)   Nonischemic cardiomyopathy   Hx of AKA (above knee amputation)   Acute respiratory acidosis   Respiratory failure   Cerebral edema     ASSESSMENT: 68 yo man with pmh of non-ishemic cardiomyapthy with EF 30-35% on echo in 2011, HTN, HL, PVD, Type II DM s/p left AKA, past cocaine abuse, tobacco abuse, and chronic LBBB who presented with vfib arrest with ROSC found to have non-obstructive catherization.   PLAN:   Vfib Arrest s/p Non-obstructive cardiac catherization -  Pt s/p non-obstructive cardiac catherization and  hypothermic protocol. Etiology unclear, possible due to non-ischemic cardiomyopathy in setting of chronic LBBB. Consider ICD pending neurological status. EEG with hypoxic encephalopathy. CT head pending. Per neurology will need to observe for at least 1 week post-rewarming for prognosis.  Ventilator Dependent  Respiratory Failure - Currently intubated. Was on IV empiric unasyn (day 3) for aspiration PNA, now changed to IV ceftriaxone. Management per PCCM.  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 Also with grade 1 diastolic dysfunction. Pt with pmh of cocaine abuse. Consider ICD pending neurological status. CXR this AM with persistent vascular congestion, mild edema and small effusion. S/p IV lasix 40 mg 3 doses per PCCM yesterday Pt at home on lasix 80 mg BID and enalapril 20 mg BID.   Hypertension - Hypotension improved. Currently on carvedilol 12.5 mg BID and bidil 20-37.5 mg BID. Pt at home on amlodipine 10 mg daily, enalapril 20 mg BID, lasix 80 mg BID, and coreg 25 mg BID.  Acute Normocytic  Anemia - Hg continues to drop from 14  two days ago to 10.8 today. Obtain FOBT. UA with trace hematuria. Currently on SQ heparin for DVT ppx. Monitor for bleeding.  Nonoliguric AKI CKD Stage 3 - Cr increased to 1.92 from 1.56, above baseline 1.5. Most likely due to recent IV lasix. Continue to monitor.  Hypokalemia - K 3.1 this AM. Mg 2.2. Was replaced with KCL this AM. Continie to monitor.  Post-anoxic Encepalopathy - On seroquel 100 mg BID and clonazepam 1 mg BID.  Hyperlipidemia - Continue home liptor 10 mg daily.  PVD - Pt at home on aspirin 81 mg daily. Hold in setting of acute anemia.  Non-Insulin Dependent Type II DM - A1c 6.9.  Pt is s/p left AKA. Hold home metformin and glipizide. Pt is on moderate SSI.  Hepatitis C Ab positive - Pending HCV viral load to confirm diagnosis.  Also pending Anti-sHBV to assess for HBV vaccination.    Jonathon BraceABBANI, Jonathon Manzi, MD  PGY-II IMTS Pager [phone] 05/09/2014  10:56 AM  Attending note to follow.

## 2014-05-09 NOTE — Progress Notes (Signed)
eLink Physician-Brief Progress Note Patient Name: Jonathon Galvan DOB: 1946/03/20 MRN: [id number]   Date of Service  05/09/2014  HPI/Events of Note  Hypokalemia  eICU Interventions  Potassium replaced     Intervention Category Intermediate Interventions: Electrolyte abnormality - evaluation and management  DETERDING,ELIZABETH 05/09/2014, 6:36 AM

## 2014-05-09 NOTE — Plan of Care (Signed)
Problem: Phase I Progression Outcomes Goal: Cardiology consult Outcome: Completed/Met Date Met:  05/09/14 Goal: Meds per hypothermia orders Outcome: Completed/Met Date Met:  05/09/14 Goal: Cool target temp reached 2 hrs from start Outcome: Completed/Met Date Met:  05/09/14 Goal: No shivering or vent asynchrony Outcome: Completed/Met Date Met:  05/09/14 Goal: Maintain MAP > 85 mmHg Outcome: Completed/Met Date Met:  05/09/14 Goal: If STEMI, emergency Cardiac Cath Outcome: Not Applicable Date Met:  63/84/53 Goal: Electrolytes normal or corrected Outcome: Progressing  Problem: Phase II Progression Outcomes Goal: Hemodynamically stable Outcome: Progressing Goal: Active warming per orders Outcome: Completed/Met Date Met:  05/09/14 Goal: No shivering Outcome: Completed/Met Date Met:  05/09/14 Goal: Electrolytes normal or corrected Outcome: Progressing Goal: Pain controlled Outcome: Completed/Met Date Met:  05/09/14

## 2014-05-09 NOTE — Progress Notes (Signed)
PULMONARY / CRITICAL CARE MEDICINE   Name: Jonathon Galvan MRN: [id number] DOB: 1946/05/31    ADMISSION DATE:  05/04/2014  INITIAL PRESENTATION:  16 M with extensive PMH inc NICM admitted after VF arrest. Hypothermia protocol initiated on admission. 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 LVEF approx 40-45% 11/12 Hypothermia protocol implemented   11/12 CT head: NAICP 11/12 CT neck: No cervical spine fracture or traumatic subluxation. Advanced multilevel cervical spondylosis is noted 11/14 echo>>>30% to 35%. Dyskinesis of the mid-apicalanteroseptal myocardium. Doppler parameters are consistent with abnormal left ventricular relaxation (grade 1 diastolic dysfunction). 11/16 eeg>>>no seizure, generalized slowing, c/w hypoxic encephalopathy  11/17: several nurses holding him down. Very agitated. Still w/ copious secretions from ETT. Started on diprivan, precedex stopped. Also started on clonazepam and Seroquel.   INDWELLING DEVICES: R radial A-line 11/12 >> 11/13 ETT 11/12 >>  R femoral CVL 11/12 >>  R femoral arterial sheath 11/12 >> 11/15 Left IJ CVL 11/16>>>    SUBJECTIVE:  Persistent agitation. Nurses holding him down VITAL SIGNS: Temp:  [96.6 F (35.9 C)-100 F (37.8 C)] 97.3 F (36.3 C) (11/17 0700) Pulse Rate:  [45-117] 56 (11/17 0747) Resp:  [12-31] 20 (11/17 0700) BP: (87-147)/(42-69) 126/57 mmHg (11/17 0747) SpO2:  [90 %-100 %] 98 % (11/17 0700) FiO2 (%):  [40 %] 40 % (11/17 0312) Weight:  [89.8 kg (197 lb 15.6 oz)] 89.8 kg (197 lb 15.6 oz) (11/17 0500)  HEMODYNAMICS:    VENTILATOR SETTINGS: Vent Mode:  [-] PRVC FiO2 (%):  [40 %] 40 % Set Rate:  [12 bmp] 12 bmp Vt Set:  [620 mL-650 mL] 620 mL PEEP:  [5 cmH20] 5 cmH20 Plateau Pressure:  [19 cmH20-21 cmH20] 19 cmH20  INTAKE / OUTPUT:  Intake/Output Summary (Last 24 hours) at 05/09/14  0911 Last data filed at 05/09/14 0757  Gross per 24 hour  Intake 2551.16 ml  Output   3150 ml  Net -598.84 ml   PHYSICAL EXAMINATION: General: agitation, flailing around in bed  Neuro: per 3 mm, moves all ext equal not following commands, localizes  HEENT: superficial scalp abrasion, C collar in place Cardiovascular:  s1 s2 RRR Lungs: ronchi t/o  Abdomen: soft, diminished BS Ext:  L AKA, 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  BMET Recent Labs     05/07/14  0417  05/08/14  0243  05/09/14  0500  NA  142  145  149*  K  4.3  4.0  3.1*  CL  105  109  111  CO2  24  21  27   BUN  19  22  25*  CREATININE  1.54*  1.56*  1.92*  GLUCOSE  125*  105*  129*    Electrolytes Recent Labs     05/07/14  0417  05/08/14  0243  05/09/14  0500  CALCIUM  8.5  8.1*  8.3*  MG   --    --   2.2  PHOS   --    --   3.0  Sepsis Markers No results for input(s): PROCALCITON, O2SATVEN in the last 72 hours.  Invalid input(s): LACTICACIDVEN  ABG Recent Labs     05/09/14  0602  PHART  7.323*  PCO2ART  48.3*  PO2ART  119.0*    Liver Enzymes Recent Labs     05/07/14  0417  05/08/14  0243  AST  37  29  ALT  26  19  ALKPHOS  93  67  BILITOT  0.5  0.6  ALBUMIN  2.7*  2.1*    Cardiac Enzymes No results for input(s): TROPONINI, PROBNP in the last 72 hours.  Glucose Recent Labs     05/08/14  1126  05/08/14  1735  05/08/14  2033  05/09/14  0050  05/09/14  0417  05/09/14  0725  GLUCAP  161*  130*  156*  132*  131*  139*    Imaging Dg Chest Port 1 View  05/09/2014   CLINICAL DATA:  Assess ET tube position.  EXAM: PORTABLE CHEST - 1 VIEW  COMPARISON:  05/08/2014  FINDINGS: The endotracheal tube is 5.3 cm above the carina. The NG tube is stable. The left IJ  catheter is stable. Persistent cardiac enlargement and vascular congestion. Small pleural effusions and bibasilar atelectasis.  IMPRESSION: Stable support apparatus.  Persistent vascular congestion, mild edema, small effusions and bibasilar atelectasis.   Electronically Signed   By: Loralie ChampagneMark  Gallerani M.D.   On: 05/09/2014 07:40   Dg Chest Port 1 View  05/08/2014   CLINICAL DATA:  68 year old male status post central line placement.  EXAM: PORTABLE CHEST - 1 VIEW  COMPARISON:  05/08/2014, 5:08 a.m.  FINDINGS: Cardiomediastinal silhouette unchanged in size and contour. Fullness of the central vasculature again evident.  Diffuse interstitial and airspace opacities. Improved aeration at the bases of the lungs.  Interval placement of left IJ central venous catheter, which terminates in the region of the left brachycephalic vein and superior vena cava confluence. No evidence of pneumothorax.  Gastric tube again projects over the mediastinum, terminating out of the field of view. The gastric port projects over the region of the stomach.  Endotracheal tube is unchanged terminating suitably above the carina.  IMPRESSION: Interval placement of left IJ central venous catheter, which terminates at the confluence of the superior vena cava and left brachycephalic vein. No evidence of pneumothorax.  Otherwise unchanged support apparatus.  Similar appearance of mixed interstitial and airspace disease, compatible with edema.  Improved aeration at the bilateral lung bases.  Signed,  Yvone NeuJaime S. Loreta AveWagner, DO  [facility]  [facility]   Electronically Signed   By: Gilmer MorJaime  Wagner D.O.   On: 05/08/2014 17:30   Dg Chest Port 1 View  05/08/2014   CLINICAL DATA:  Ventilator dependent respiratory failure. Acute respiratory acidosis. Shortness of breath. Prior history of CHF.  EXAM: PORTABLE CHEST - 1 VIEW  COMPARISON:  Portable chest x-rays yesterday dating back to 05/04/2014.  FINDINGS:  Endotracheal tube tip in satisfactory position projecting approximately 3 cm above the carina. Nasogastric tube courses below the diaphragm into the stomach. Cardiac silhouette moderately to markedly enlarged but stable. Interval development of mild-to-moderate diffuse interstitial and airspace pulmonary edema since yesterday. Developing bilateral pleural effusions and associated dense passive atelectasis in the lower lobes.  IMPRESSION: 1. Support apparatus satisfactory. 2. New moderate CHF, with interstitial and airspace pulmonary edema and moderate-sized bilateral pleural effusions. Associated passive atelectasis in the lower lobes.   Electronically Signed   By: Maisie Fushomas  Lawrence M.D.   On: 05/08/2014 07:43      [id number]   ASSESSMENT / PLAN:  PULMONARY A: Acute respiratory failure s/p cardiopulmonary arrest.  Aspiration PNA CXR w/ worse aeration, still has copious this ETT secretions. MS and airway clearance are his major barriers to extubation  P:   Cont full vent support w/ PSV trials as tolerated See Neuro section. PAD protocol for RAS goal -2 See ID section   Unable to extubate until agitation level and airway secretions improve  CARDIOVASCULAR A:  VF cardiac arrest Previously documented NICM: EF 30-35%  Chronic LBBB Sinus bradycardia Ventricular ectopy Cardiogenic shock-->shock resolved  P:  Tele If awaken, will need icd Cont coreg, isosorbide and lipitor  Negative fluid balance goal   RENAL A:   CRI w/ acute on chronic injury  scr rising after diuresis  Hypernatremia  Hypokalemia   P:   Chem in am KVO IVF, hold off on lasix for 11/17 Free water VT  Replace electrolytes as indicated.  GASTROINTESTINAL A:   Npo 11/14, hep c? Diet  Ammonia 61 on 11/17 P:   SUP: IV PPI Tf to goal Hep C Ab reactive. Start lactulose given high ammonia   HEMATOLOGIC A:   Mild anemia  P:  DVT px: SQ heparin Monitor CBC intermittently Transfuse per usual ICU  guidelines  INFECTIOUS A:   Aspiration PNA  P:   Trend PCT  Unasyn 11/16>>>11/17 Rocephin 11/17>>>11/22 Sputum 11/16>>>NTD BC 11/12>>>NTD  ENDOCRINE A:   DM 2 P:   Cont SSI  NEUROLOGIC A:   Post anoxic encephalopathy H/o cocaine, methadone (active?) P:   RASS goal: -2 Change propofol to precedex Fentanyl drip Start clonazepam and seroquel  Keep collar on until can clear clinicaly Neuro consulted and following   FAMILY  - Updated at bedside on 11/17  Anders Simmonds ACNP-BC [facility] Pulmonary/Critical Care Pager # [phone] OR # [phone] if no answer  Will change propofol back to precedex given hypotension, will change unasyn to rocephin, f/u on cultures, family updated.  My CC time of 35 min independent of extender time.  Patient seen and examined, agree with above note.  I dictated the care and orders written for this patient under my direction.  Alyson Reedy, MD [phone]

## 2014-05-09 NOTE — Progress Notes (Signed)
Subjective: Was agitated and given sedative boluses just prior to exam.   There was some question of following commands earlier.   Exam: Filed Vitals:   05/09/14 1800  BP: 121/45  Pulse: 79  Temp: 101.3 F (38.5 C)  Resp: 15   Gen: In bed, NAD MS: does not open eyes or follow commands.  HO:ZYYQM, corneals intact.  Motor: does not respond to nox stim(recent bolus) Sensory:as above   Impression: 68 yo M with persistent encephalopathy following cardiac arrest. He has been started on seroquel and clonazepam.   Recommendations: 1) Will continue to follow.   Ritta Slot, MD [facility] [phone]  If 7pm- 7am, please page neurology on call as listed in AMION.

## 2014-05-09 NOTE — Progress Notes (Signed)
Turned off sedation medications for WUA. Pt became very agitated. 3 unit staff members required to maintain pt safety. Resumed sedation medications and administered bolus. Pt continued to be agitated (Rass 3). Pete NP at bedside. Orders received. Will continue to monitor.

## 2014-05-10 ENCOUNTER — Inpatient Hospital Stay (HOSPITAL_COMMUNITY): Payer: PRIVATE HEALTH INSURANCE

## 2014-05-10 DIAGNOSIS — G931 Anoxic brain damage, not elsewhere classified: Secondary | ICD-10-CM

## 2014-05-10 DIAGNOSIS — J690 Pneumonitis due to inhalation of food and vomit: Secondary | ICD-10-CM | POA: Diagnosis present

## 2014-05-10 DIAGNOSIS — J189 Pneumonia, unspecified organism: Secondary | ICD-10-CM

## 2014-05-10 LAB — GLUCOSE, CAPILLARY
GLUCOSE-CAPILLARY: 139 mg/dL — AB (ref 70–99)
Glucose-Capillary: 104 mg/dL — ABNORMAL HIGH (ref 70–99)
Glucose-Capillary: 123 mg/dL — ABNORMAL HIGH (ref 70–99)
Glucose-Capillary: 150 mg/dL — ABNORMAL HIGH (ref 70–99)
Glucose-Capillary: 152 mg/dL — ABNORMAL HIGH (ref 70–99)
Glucose-Capillary: 179 mg/dL — ABNORMAL HIGH (ref 70–99)

## 2014-05-10 LAB — CBC
HCT: 33.6 % — ABNORMAL LOW (ref 39.0–52.0)
HEMOGLOBIN: 10.7 g/dL — AB (ref 13.0–17.0)
MCH: 26.2 pg (ref 26.0–34.0)
MCHC: 31.8 g/dL (ref 30.0–36.0)
MCV: 82.2 fL (ref 78.0–100.0)
PLATELETS: 188 10*3/uL (ref 150–400)
RBC: 4.09 MIL/uL — ABNORMAL LOW (ref 4.22–5.81)
RDW: 16 % — ABNORMAL HIGH (ref 11.5–15.5)
WBC: 11.9 10*3/uL — ABNORMAL HIGH (ref 4.0–10.5)

## 2014-05-10 LAB — COMPREHENSIVE METABOLIC PANEL
ALT: 20 U/L (ref 0–53)
AST: 34 U/L (ref 0–37)
Albumin: 2.2 g/dL — ABNORMAL LOW (ref 3.5–5.2)
Alkaline Phosphatase: 52 U/L (ref 39–117)
Anion gap: 11 (ref 5–15)
BUN: 24 mg/dL — ABNORMAL HIGH (ref 6–23)
CO2: 26 mEq/L (ref 19–32)
Calcium: 8.4 mg/dL (ref 8.4–10.5)
Chloride: 116 mEq/L — ABNORMAL HIGH (ref 96–112)
Creatinine, Ser: 2.07 mg/dL — ABNORMAL HIGH (ref 0.50–1.35)
GFR calc Af Amer: 36 mL/min — ABNORMAL LOW (ref >90)
GFR calc non Af Amer: 31 mL/min — ABNORMAL LOW (ref >90)
Glucose, Bld: 145 mg/dL — ABNORMAL HIGH (ref 70–99)
Potassium: 3.6 mEq/L — ABNORMAL LOW (ref 3.7–5.3)
Sodium: 153 mEq/L — ABNORMAL HIGH (ref 137–147)
Total Bilirubin: 0.3 mg/dL (ref 0.3–1.2)
Total Protein: 5.8 g/dL — ABNORMAL LOW (ref 6.0–8.3)

## 2014-05-10 LAB — HCV RNA QUANT
HCV Quantitative Log: 6.7 {Log} — ABNORMAL HIGH (ref <1.18)
HCV Quantitative: 5004044 IU/mL — ABNORMAL HIGH (ref <15)

## 2014-05-10 LAB — CLOSTRIDIUM DIFFICILE BY PCR: Toxigenic C. Difficile by PCR: NEGATIVE

## 2014-05-10 LAB — OCCULT BLOOD X 1 CARD TO LAB, STOOL: Fecal Occult Bld: NEGATIVE

## 2014-05-10 LAB — PROCALCITONIN: Procalcitonin: 1.48 ng/mL

## 2014-05-10 MED ORDER — POTASSIUM CHLORIDE 20 MEQ/15ML (10%) PO SOLN
40.0000 meq | Freq: Three times a day (TID) | ORAL | Status: AC
  Administered 2014-05-10 (×2): 40 meq
  Filled 2014-05-10 (×3): qty 30

## 2014-05-10 MED ORDER — VALPROATE SODIUM 500 MG/5ML IV SOLN
500.0000 mg | Freq: Two times a day (BID) | INTRAVENOUS | Status: DC
  Administered 2014-05-10 – 2014-05-15 (×12): 500 mg via INTRAVENOUS
  Filled 2014-05-10 (×15): qty 5

## 2014-05-10 MED ORDER — FREE WATER
300.0000 mL | Freq: Four times a day (QID) | Status: DC
  Administered 2014-05-10 – 2014-05-11 (×4): 300 mL

## 2014-05-10 MED ORDER — POTASSIUM CHLORIDE 10 MEQ/100ML IV SOLN
10.0000 meq | INTRAVENOUS | Status: AC
  Administered 2014-05-10 (×4): 10 meq via INTRAVENOUS
  Filled 2014-05-10 (×3): qty 100

## 2014-05-10 NOTE — Progress Notes (Signed)
SUBJECTIVE:  Pt seen and examined in AM. He remains intubated and has intermittent agitation requiring sedative boluses. His telemetry reveals chronic BBB. His blood pressure and heart rate remain stable. His CT head yesterday was negative for bleed. His CXR today showed b/l pleural effusions with left LL lung collapse vs consolidation and right 4th or 5th rib fracture.    OBJECTIVE:   Vitals:   Filed Vitals:   05/10/14 0900 05/10/14 1000 05/10/14 1100 05/10/14 1204  BP: 164/70 119/50 118/50 154/76  Pulse: 82 78 75 76  Temp: 98.8 F (37.1 C) 98.8 F (37.1 C) 98.4 F (36.9 C)   TempSrc:      Resp: 26 19 20 26   Height:      Weight:      SpO2: 98% 96% 98% 100%   I&O's:    Intake/Output Summary (Last 24 hours) at 05/10/14 1205 Last data filed at 05/10/14 1100  Gross per 24 hour  Intake 2462.38 ml  Output   1815 ml  Net 647.38 ml   TELEMETRY: Reviewed telemetry pt in BBB and SB:     PHYSICAL EXAM General: Intubated and sedated Head:   Large abrasion/contusion on right side. C-collar in place.   Lungs:  Intubated. Ronchi in anterior fields. Heart:  HRRR S1 S2  No JVD.   Abdomen: Abdomen soft and non-tender Msk:  Left AKA   Extremities:  SCD's. No edema of RLE. Neuro: Sedated  Psych:  Sedated Skin: No rash   LABS: Basic Metabolic Panel:  Recent Labs  16/10/96 0500 05/10/14 0335  NA 149* 153*  K 3.1* 3.6*  CL 111 116*  CO2 27 26  GLUCOSE 129* 145*  BUN 25* 24*  CREATININE 1.92* 2.07*  CALCIUM 8.3* 8.4  MG 2.2  --   PHOS 3.0  --    Liver Function Tests:  Recent Labs  05/08/14 0243 05/10/14 0335  AST 29 34  ALT 19 20  ALKPHOS 67 52  BILITOT 0.6 0.3  PROT 5.7* 5.8*  ALBUMIN 2.1* 2.2*   No results for input(s): LIPASE, AMYLASE in the last 72 hours. CBC:  Recent Labs  05/08/14 0243 05/09/14 0500 05/10/14 0335  WBC 16.3* 12.3* 11.9*  NEUTROABS 12.7*  --   --   HGB 11.7* 10.8* 10.7*  HCT 35.7* 33.6* 33.6*  MCV 82.4 80.4 82.2  PLT 160 177  188   Cardiac Enzymes: No results for input(s): CKTOTAL, CKMB, CKMBINDEX, TROPONINI in the last 72 hours. BNP: Invalid input(s): POCBNP D-Dimer: No results for input(s): DDIMER in the last 72 hours. Hemoglobin A1C:  Recent Labs  05/09/14 0500  HGBA1C 6.9*   Fasting Lipid Panel:  Recent Labs  05/09/14 0901  TRIG 120   Thyroid Function Tests: No results for input(s): TSH, T4TOTAL, T3FREE, THYROIDAB in the last 72 hours.  Invalid input(s): FREET3 Anemia Panel: No results for input(s): VITAMINB12, FOLATE, FERRITIN, TIBC, IRON, RETICCTPCT in the last 72 hours. Coag Panel:   Lab Results  Component Value Date   INR 1.23 05/05/2014   INR 1.23 05/04/2014   INR 1.10 06/20/2010    RADIOLOGY: Ct Head Wo Contrast  05/09/2014   CLINICAL DATA:  Cerebral edema.  Status post cardiac arrest.  EXAM: CT HEAD WITHOUT CONTRAST  TECHNIQUE: Contiguous axial images were obtained from the base of the skull through the vertex without intravenous contrast.  COMPARISON:  CT scan of May 04, 2014.  FINDINGS: Bony calvarium appears intact. Bilateral ethmoid and sphenoid and left maxillary  sinusitis is noted. Mild diffuse cortical atrophy is noted. Mild chronic ischemic white matter disease is noted. No mass effect or midline shift is noted. Ventricular size is within normal limits. There is no evidence of mass lesion, hemorrhage or acute infarction.  IMPRESSION: Mild diffuse cortical atrophy. Mild chronic ischemic white matter disease. Bilateral ethmoid and sphenoid sinusitis. No acute intracranial abnormality seen.   Electronically Signed   By: Roque Lias M.D.   On: 05/09/2014 15:17   Ct Head Wo Contrast  05/04/2014   CLINICAL DATA:  Syncopal episode. Patient found down. Out of hospital cardiac arrest.  EXAM: CT HEAD WITHOUT CONTRAST  CT CERVICAL SPINE WITHOUT CONTRAST  TECHNIQUE: Multidetector CT imaging of the head and cervical spine was performed following the standard protocol without  intravenous contrast. Multiplanar CT image reconstructions of the cervical spine were also generated.  COMPARISON:  Prior CT head from 08/10/2007.  FINDINGS: CT HEAD FINDINGS  No evidence for acute infarction, hemorrhage, mass lesion, hydrocephalus, or extra-axial fluid. Mild cerebral and cerebellar atrophy. Hypoattenuation of white matter is consistent with chronic microvascular ischemic change.  The calvarium is intact. There is no sinus or mastoid air fluid level although the ethmoids are opacified. Likely post intubation. The patient is intubated, with accompanying nasopharyngeal fluid.  CT CERVICAL SPINE FINDINGS  There is no visible cervical spine fracture, traumatic subluxation, prevertebral soft tissue swelling, or intraspinal hematoma. Mild straightening of the normal cervical lordosis. Mild ossification of the posterior longitudinal ligament. Multilevel disc space narrowing from C2 through T1. Moderately advanced facet arthropathy at C2-3 on the LEFT. Vascular calcification. Central venous line. Endotracheal tube. No pneumothorax. No worrisome neck mass.  IMPRESSION: Chronic changes as described. Similar appearance to 2009. No acute intracranial findings.  No cervical spine fracture or traumatic subluxation. Advanced multilevel cervical spondylosis is noted.  Visualized support tubes and lines appear appropriately placed.  Findings discussed with consulting MD.   Electronically Signed   By: Davonna Belling M.D.   On: 05/04/2014 18:26   Ct Cervical Spine Wo Contrast  05/04/2014   CLINICAL DATA:  Syncopal episode. Patient found down. Out of hospital cardiac arrest.  EXAM: CT HEAD WITHOUT CONTRAST  CT CERVICAL SPINE WITHOUT CONTRAST  TECHNIQUE: Multidetector CT imaging of the head and cervical spine was performed following the standard protocol without intravenous contrast. Multiplanar CT image reconstructions of the cervical spine were also generated.  COMPARISON:  Prior CT head from 08/10/2007.  FINDINGS:  CT HEAD FINDINGS  No evidence for acute infarction, hemorrhage, mass lesion, hydrocephalus, or extra-axial fluid. Mild cerebral and cerebellar atrophy. Hypoattenuation of white matter is consistent with chronic microvascular ischemic change.  The calvarium is intact. There is no sinus or mastoid air fluid level although the ethmoids are opacified. Likely post intubation. The patient is intubated, with accompanying nasopharyngeal fluid.  CT CERVICAL SPINE FINDINGS  There is no visible cervical spine fracture, traumatic subluxation, prevertebral soft tissue swelling, or intraspinal hematoma. Mild straightening of the normal cervical lordosis. Mild ossification of the posterior longitudinal ligament. Multilevel disc space narrowing from C2 through T1. Moderately advanced facet arthropathy at C2-3 on the LEFT. Vascular calcification. Central venous line. Endotracheal tube. No pneumothorax. No worrisome neck mass.  IMPRESSION: Chronic changes as described. Similar appearance to 2009. No acute intracranial findings.  No cervical spine fracture or traumatic subluxation. Advanced multilevel cervical spondylosis is noted.  Visualized support tubes and lines appear appropriately placed.  Findings discussed with consulting MD.   Electronically Signed   By:  Davonna BellingJohn  Curnes M.D.   On: 05/04/2014 18:26   Dg Chest Port 1 View  05/10/2014   CLINICAL DATA:  68 year old male with shortness of breath and pneumonia. Initial encounter.  EXAM: PORTABLE CHEST - 1 VIEW  COMPARISON:  05/09/2014 and earlier.  FINDINGS: Portable AP semi upright view at 0438 hrs. Stable endotracheal tube. Stable left IJ central line. Stable visualized enteric tube.  Continued dense retrocardiac opacity and veiling opacity at both lung bases. Stable pulmonary vascularity. No pneumothorax. The patient is more rotated to the right.  Evidence of right lateral fourth or fifth rib destruction (arrow). The right lateral ribs were normal on 08/10/2007 chest CT. No  other acute osseous abnormality identified.  IMPRESSION: 1.  Stable lines and tubes. 2. Stable ventilation with bilateral pleural effusions and lower lobe collapse or consolidation. 3. Destruction versus fracture of the right lateral fourth or fifth rib (arrow). Has there been recent CPR or is there a known malignancy?   Electronically Signed   By: Augusto GambleLee  Hall M.D.   On: 05/10/2014 06:20   Dg Chest Port 1 View  05/09/2014   CLINICAL DATA:  Assess ET tube position.  EXAM: PORTABLE CHEST - 1 VIEW  COMPARISON:  05/08/2014  FINDINGS: The endotracheal tube is 5.3 cm above the carina. The NG tube is stable. The left IJ catheter is stable. Persistent cardiac enlargement and vascular congestion. Small pleural effusions and bibasilar atelectasis.  IMPRESSION: Stable support apparatus.  Persistent vascular congestion, mild edema, small effusions and bibasilar atelectasis.   Electronically Signed   By: Loralie ChampagneMark  Gallerani M.D.   On: 05/09/2014 07:40   Dg Chest Port 1 View  05/08/2014   CLINICAL DATA:  68 year old male status post central line placement.  EXAM: PORTABLE CHEST - 1 VIEW  COMPARISON:  05/08/2014, 5:08 a.m.  FINDINGS: Cardiomediastinal silhouette unchanged in size and contour. Fullness of the central vasculature again evident.  Diffuse interstitial and airspace opacities. Improved aeration at the bases of the lungs.  Interval placement of left IJ central venous catheter, which terminates in the region of the left brachycephalic vein and superior vena cava confluence. No evidence of pneumothorax.  Gastric tube again projects over the mediastinum, terminating out of the field of view. The gastric port projects over the region of the stomach.  Endotracheal tube is unchanged terminating suitably above the carina.  IMPRESSION: Interval placement of left IJ central venous catheter, which terminates at the confluence of the superior vena cava and left brachycephalic vein. No evidence of pneumothorax.  Otherwise  unchanged support apparatus.  Similar appearance of mixed interstitial and airspace disease, compatible with edema.  Improved aeration at the bilateral lung bases.  Signed,  Yvone NeuJaime S. Loreta AveWagner, DO  [facility]  [facility]   Electronically Signed   By: Gilmer MorJaime  Wagner D.O.   On: 05/08/2014 17:30   Dg Chest Port 1 View  05/08/2014   CLINICAL DATA:  Ventilator dependent respiratory failure. Acute respiratory acidosis. Shortness of breath. Prior history of CHF.  EXAM: PORTABLE CHEST - 1 VIEW  COMPARISON:  Portable chest x-rays yesterday dating back to 05/04/2014.  FINDINGS: Endotracheal tube tip in satisfactory position projecting approximately 3 cm above the carina. Nasogastric tube courses below the diaphragm into the stomach. Cardiac silhouette moderately to markedly enlarged but stable. Interval development of mild-to-moderate diffuse interstitial and airspace pulmonary edema since yesterday. Developing bilateral pleural effusions and associated dense passive atelectasis in the lower lobes.  IMPRESSION: 1. Support apparatus satisfactory. 2.  New moderate CHF, with interstitial and airspace pulmonary edema and moderate-sized bilateral pleural effusions. Associated passive atelectasis in the lower lobes.   Electronically Signed   By: Hulan Saas M.D.   On: 05/08/2014 07:43   Dg Chest Port 1 View  05/07/2014   CLINICAL DATA:  Cardiac arrest, history of asthma  EXAM: PORTABLE CHEST - 1 VIEW  COMPARISON:  05/06/2014  FINDINGS: Cardiomediastinal silhouette is stable. Endotracheal tube in place with tip 4.5 cm above the carina. Stable NG tube position. Mild basilar atelectasis again noted. No segmental infiltrate or pulmonary edema  IMPRESSION: Stable support apparatus. Again noted mild basilar atelectasis. No segmental infiltrate or pulmonary edema.   Electronically Signed   By: Natasha Mead M.D.   On: 05/07/2014 11:22   Dg Chest Port 1 View  05/06/2014   CLINICAL  DATA:  Respiratory failure  EXAM: PORTABLE CHEST - 1 VIEW  COMPARISON:  05/05/1949  FINDINGS: An endotracheal tube is again noted 2.8 cm above the carina. A nasogastric catheter is noted within the stomach. The cardiac shadow remains enlarged. Mild bibasilar atelectatic changes are seen. No significant vascular congestion is noted.  IMPRESSION: Mild bibasilar atelectatic changes.  Tubes and lines as described.   Electronically Signed   By: Alcide Clever M.D.   On: 05/06/2014 07:22   Dg Chest Port 1 View  05/05/2014   CLINICAL DATA:  Evaluate airspace disease  EXAM: PORTABLE CHEST - 1 VIEW  COMPARISON:  05/04/2014  FINDINGS: Endotracheal tube tip between the clavicular heads and carina. A gastric suction tube enters the stomach. Right subclavian central line, malpositioned on the previous study, has been removed.  Unchanged cardiomegaly.  Stable aortic contours.  Pulmonary venous congestion has decreased but there is now hazy and streaky bibasilar opacities. No pneumothorax.  IMPRESSION: 1. New orogastric tube is in good position. 2. Improved pulmonary edema. 3. Worsening basilar aeration, likely layering small effusions and atelectasis. Pneumonia cannot be excluded.   Electronically Signed   By: Tiburcio Pea M.D.   On: 05/05/2014 08:00   Dg Chest Portable 1 View  05/04/2014   CLINICAL DATA:  Status post cardiac arrest and CPR.  Intubated.  EXAM: PORTABLE CHEST - 1 VIEW  COMPARISON:  06/13/2010.  FINDINGS: Interval endotracheal tube with its tip 3.6 cm above the carina. Breast of enlargement of the cardiac silhouette with increased prominence of the pulmonary vasculature and interstitial markings. No pleural fluid seen. No pneumothorax. An interval epicardial pacemaker lead is in place. Right subclavian catheter extending into the right neck, its tip not included.  IMPRESSION: 1. Malpositioned right subclavian catheter extending into the neck on the right. 2. Progressive cardiomegaly and changes of  congestive heart failure. These results will be called to the ordering clinician or representative by the Radiologist Assistant, and communication documented in the PACS or zVision Dashboard.   Electronically Signed   By: Gordan Payment M.D.   On: 05/04/2014 17:47   Dg Abd Portable 1v  05/04/2014   CLINICAL DATA:  Orogastric tube placement.  EXAM: PORTABLE ABDOMEN - 1 VIEW  COMPARISON:  08/11/2007.  FINDINGS: Orogastric tube tip in the mid to distal stomach. The visualized bowel gas pattern is normal. The patient is rotated to the right. Possible hiatal hernia and right basilar airspace opacity.  IMPRESSION: 1. Orogastric tube tip in the mid to distal stomach. 2. Possible hiatal hernia. 3. Mild patchy atelectasis or pneumonia at the right lung base.   Electronically Signed   By: Brett Canales  Azucena Kuba M.D.   On: 05/04/2014 21:39   Principal Problem:   Cardiac arrest Active Problems:   Chronic hepatitis C   DM2 (diabetes mellitus, type 2)   Essential hypertension   Peripheral vascular disease   VF (ventricular fibrillation)   Nonischemic cardiomyopathy   Hx of AKA (above knee amputation)   Acute respiratory acidosis   Respiratory failure   Cerebral edema   Anoxic brain injury     ASSESSMENT: 68 yo man with pmh of non-ishemic cardiomyapthy with EF 30-35% on echo in 2011, HTN, HL, PVD, Type II DM s/p left AKA, past cocaine abuse, tobacco abuse, and chronic LBBB who presented with vfib arrest with ROSC found to have non-obstructive catherization.   PLAN:   Vfib Arrest s/p Non-obstructive cardiac catherization -  Pt s/p non-obstructive cardiac catherization and  hypothermic protocol. Etiology unclear, possible due to non-ischemic cardiomyopathy in setting of chronic LBBB. Will consider ICD pending neurological status. EEG with hypoxic encephalopathy. CT head with no acute changes. Per neurology will need to observe for at least 1 week post-rewarming for prognosis.  Ventilator Dependent Respiratory Failure  in setting of left LL lung collapse/consoldiation- Currently intubated. On IV ceftriaxone day 4 for aspiration PNA. CXR with left lower lung collapse vs consolidation. Management per PCCM.  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 Also with grade 1 diastolic dysfunction. Pt with pmh of cocaine abuse. Consider ICD pending neurological status. CXR with bilateral pleural effusions. Pt at home on lasix 80 mg BID and enalapril 20 mg BID.  Hypernatremia - Na 153 today. He received 3 days of unasyn with saline. He is on free water per tube, consider increasing.  Hypertension - Currently hypertensive. Currently on carvedilol 12.5 mg BID and bidil 20-37.5 mg BID. Pt at home on amlodipine 10 mg daily, enalapril 20 mg BID, lasix 80 mg BID, and coreg 25 mg BID.  Acute Normocytic Anemia - Hg stable at 10.7, below baseline 14. Obtain FOBT. UA with trace hematuria. Currently on SQ heparin for DVT ppx. Monitor for bleeding.  Nonoliguric AKI CKD Stage 3 - Cr increased to 2.07 from 1.92 from 1.56, above baseline 1.5. Most likely due to recent IV lasix with hypotension. Etiology pre-renal due to volume depletion vs ischemic ATN. He is on free water per tube, consider increasing. Hold home  lasix 80 mg BID and enalapril 20 mg BID. Continue to monitor.  Hypokalemia - K 3.6 this AM.  Was replaced with KCL this AM. Continue to monitor.  Post-anoxic Encepalopathy - On seroquel 100 mg BID and clonazepam 1 mg BID and today started on depakote 500 mg BID per neurology.  Hyperlipidemia - Continue home liptor 10 mg daily.  PVD - Pt at home on aspirin 81 mg daily. Hold in setting of acute anemia.  Non-Insulin Dependent Type II DM - A1c 6.9.  Pt is s/p left AKA. Hold home metformin and glipizide. Pt is on moderate SSI.  Hepatitis C Infection - HCV viral load [id number].  Normal AST/ALT with chronic hypoalbuminemia. Anti-sHBV negative indicating no prior HBC  vaccination.     Otis Brace, MD  PGY-II IMTS Pager [phone] 05/10/2014  12:05 PM  Attending note by Dr. Katrinka Blazing to follow.

## 2014-05-10 NOTE — Plan of Care (Signed)
Problem: Consults Goal: Skin Care Protocol Initiated - if Braden Score 18 or less If consults are not indicated, leave blank or document N/A  Outcome: Completed/Met Date Met:  05/10/14 Goal: Nutrition Consult-if indicated Outcome: Completed/Met Date Met:  05/10/14 Goal: Diabetes Guidelines if Diabetic/Glucose > 140 If diabetic or lab glucose is > 140 mg/dl - Initiate Diabetes/Hyperglycemia Guidelines & Document Interventions  Outcome: Completed/Met Date Met:  05/10/14 Goal: Chaplain Consult Outcome: Completed/Met Date Met:  05/10/14  Problem: Phase II Progression Outcomes Goal: Hemodynamically stable Outcome: Completed/Met Date Met:  05/10/14 Goal: D/C medications per orders Outcome: Completed/Met Date Met:  05/10/14 Goal: Cooling device Outcome: Completed/Met Date Met:  05/10/14 Goal: Electrolytes normal or corrected Outcome: Completed/Met Date Met:  05/10/14  Problem: Phase III Progression Outcomes Goal: Hemodynamically stable Outcome: Completed/Met Date Met:  05/10/14 Goal: Neurology Consult per CCM Outcome: Completed/Met Date Met:  05/10/14 Goal: Cardiac Cath per orders Outcome: Completed/Met Date Met:  05/10/14

## 2014-05-10 NOTE — Progress Notes (Signed)
Subjective: Was agitated and given sedative boluses just prior to exam.   There was some question of following commands earlier.   Exam: Filed Vitals:   05/10/14 1000  BP: 119/50  Pulse: 78  Temp: 98.8 F (37.1 C)  Resp: 19   Gen: In bed, NAD MS: opens eyes to voice. He does not reliably follow appendicular commands, but does stick his tongue out to command.  CB:JSEGB, corneals intact.  Motor: moves all extremities with flailing movements.  Sensory:as above   Impression: 68 yo M with persistent encephalopathy following cardiac arrest. I suspect a multifactorial delirium including some degree of hypoxic insult. He appears to be havin gsome improvements, but long term prognosis remains unclear.   Recommendations: 1) Start depakote 500mg  BID as this can sometimes be helpful in refractory delirium.   Ritta Slot, MD [facility] [phone]  If 7pm- 7am, please page neurology on call as listed in AMION.

## 2014-05-10 NOTE — Progress Notes (Signed)
PULMONARY / CRITICAL CARE MEDICINE   Name: Jonathon Galvan MRN: [id number] DOB: 07/08/1945    ADMISSION DATE:  05/04/2014  INITIAL PRESENTATION:  68 M with extensive PMH inc NICM admitted after VF arrest. Hypothermia protocol initiated on admission. 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 LVEF approx 40-45% 11/12 Hypothermia protocol implemented   11/12 CT head: NAICP 11/12 CT neck: No cervical spine fracture or traumatic subluxation. Advanced multilevel cervical spondylosis is noted 11/14 echo>>>30% to 35%. Dyskinesis of the mid-apicalanteroseptal myocardium. Doppler parameters are consistent with abnormal left ventricular relaxation (grade 1 diastolic dysfunction). 11/16 eeg>>>no seizure, generalized slowing, c/w hypoxic encephalopathy  11/17: several nurses holding him down. Very agitated. Still w/ copious secretions from ETT. Started on diprivan, precedex stopped. Also started on clonazepam and Seroquel.   INDWELLING DEVICES: R radial A-line 11/12 >> 11/13 ETT 11/12 >>  R femoral CVL 11/12 >> 11/15 R femoral arterial sheath 11/12 >> 11/15 Left IJ CVL 11/16>>>  SUBJECTIVE:  Continues to be agitated and non-puposeful on wake up assessment but reportedly stuck his tongue out for neurology upon exam.  VITAL SIGNS: Temp:  [98.1 F (36.7 C)-101.5 F (38.6 C)] 98.4 F (36.9 C) (11/18 1100) Pulse Rate:  [72-98] 75 (11/18 1100) Resp:  [12-34] 20 (11/18 1100) BP: (110-164)/(41-78) 118/50 mmHg (11/18 1100) SpO2:  [95 %-100 %] 98 % (11/18 1100) FiO2 (%):  [40 %] 40 % (11/18 0800) Weight:  [89.8 kg (197 lb 15.6 oz)] 89.8 kg (197 lb 15.6 oz) (11/18 0400)  HEMODYNAMICS: CVP:  [6 mmHg-11 mmHg] 7 mmHg  VENTILATOR SETTINGS: Vent Mode:  [-] CPAP FiO2 (%):  [40 %] 40 % Set Rate:  [12 bmp] 12 bmp Vt Set:  [742 mL] 620 mL PEEP:  [5 cmH20] 5 cmH20 Pressure  Support:  [8 cmH20] 8 cmH20 Plateau Pressure:  [16 cmH20-19 cmH20] 19 cmH20  INTAKE / OUTPUT:  Intake/Output Summary (Last 24 hours) at 05/10/14 1203 Last data filed at 05/10/14 1100  Gross per 24 hour  Intake 2462.38 ml  Output   1815 ml  Net 647.38 ml   PHYSICAL EXAMINATION: General: Sedated, intubated, moving ext to painful stimuli but no command Neuro: Moving all ext to painful stimuli but not to command HEENT: superficial scalp abrasion, C collar in place Cardiovascular:  s1 s2 RRR Lungs: ronchi t/o  Abdomen: soft, diminished BS Ext:  L AKA, 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  BMET Recent Labs     05/08/14  0243  05/09/14  0500  05/10/14  0335  NA  145  149*  153*  K  4.0  3.1*  3.6*  CL  109  111  116*  CO2  21  27  26   BUN  22  25*  24*  CREATININE  1.56*  1.92*  2.07*  GLUCOSE  105*  129*  145*    Electrolytes Recent Labs     05/08/14  0243  05/09/14  0500  05/10/14  0335  CALCIUM  8.1*  8.3*  8.4  MG   --   2.2   --   PHOS   --   3.0   --     Sepsis Markers Recent Labs     05/09/14  0930  05/10/14  0335  PROCALCITON  1.71  1.48    ABG Recent Labs     05/09/14  0602  PHART  7.323*  PCO2ART  48.3*  PO2ART  119.0*    Liver Enzymes Recent Labs     05/08/14  0243  05/10/14  0335  AST  29  34  ALT  19  20  ALKPHOS  67  52  BILITOT  0.6  0.3  ALBUMIN  2.1*  2.2*    Cardiac Enzymes No results for input(s): TROPONINI, PROBNP in the last 72 hours.  Glucose Recent Labs     05/09/14  1117  05/09/14  1655  05/09/14  1953  05/10/14  0010  05/10/14  0330  05/10/14  0733  GLUCAP  124*  120*  152*  152*  123*  104*    Imaging Ct Head Wo Contrast  05/09/2014   CLINICAL DATA:  Cerebral edema.  Status post  cardiac arrest.  EXAM: CT HEAD WITHOUT CONTRAST  TECHNIQUE: Contiguous axial images were obtained from the base of the skull through the vertex without intravenous contrast.  COMPARISON:  CT scan of May 04, 2014.  FINDINGS: Bony calvarium appears intact. Bilateral ethmoid and sphenoid and left maxillary sinusitis is noted. Mild diffuse cortical atrophy is noted. Mild chronic ischemic white matter disease is noted. No mass effect or midline shift is noted. Ventricular size is within normal limits. There is no evidence of mass lesion, hemorrhage or acute infarction.  IMPRESSION: Mild diffuse cortical atrophy. Mild chronic ischemic white matter disease. Bilateral ethmoid and sphenoid sinusitis. No acute intracranial abnormality seen.   Electronically Signed   By: Roque Lias M.D.   On: 05/09/2014 15:17   Dg Chest Port 1 View  05/10/2014   CLINICAL DATA:  68 year old male with shortness of breath and pneumonia. Initial encounter.  EXAM: PORTABLE CHEST - 1 VIEW  COMPARISON:  05/09/2014 and earlier.  FINDINGS: Portable AP semi upright view at 0438 hrs. Stable endotracheal tube. Stable left IJ central line. Stable visualized enteric tube.  Continued dense retrocardiac opacity and veiling opacity at both lung bases. Stable pulmonary vascularity. No pneumothorax. The patient is more rotated to the right.  Evidence of right lateral fourth or fifth rib destruction (arrow). The right lateral ribs were normal on 08/10/2007 chest CT. No other acute osseous abnormality identified.  IMPRESSION: 1.  Stable lines and tubes. 2. Stable ventilation with bilateral pleural effusions and lower lobe collapse or consolidation. 3. Destruction versus fracture of the right lateral fourth or fifth rib (arrow). Has there been recent CPR or is there a known malignancy?   Electronically Signed   By: Augusto Gamble M.D.   On: 05/10/2014 06:20   Dg Chest Port 1 View  05/09/2014   CLINICAL DATA:  Assess ET tube position.  EXAM: PORTABLE CHEST  - 1 VIEW  COMPARISON:  05/08/2014  FINDINGS: The endotracheal tube is 5.3 cm above the carina. The NG tube is stable. The left IJ catheter is stable. Persistent cardiac enlargement and vascular congestion. Small pleural effusions and bibasilar atelectasis.  IMPRESSION: Stable support apparatus.  Persistent vascular congestion, mild edema, small effusions and bibasilar atelectasis.   Electronically Signed   By: Loralie Champagne M.D.   On: 05/09/2014 07:40  Dg Chest Port 1 View  05/08/2014   CLINICAL DATA:  68 year old male status post central line placement.  EXAM: PORTABLE CHEST - 1 VIEW  COMPARISON:  05/08/2014, 5:08 a.m.  FINDINGS: Cardiomediastinal silhouette unchanged in size and contour. Fullness of the central vasculature again evident.  Diffuse interstitial and airspace opacities. Improved aeration at the bases of the lungs.  Interval placement of left IJ central venous catheter, which terminates in the region of the left brachycephalic vein and superior vena cava confluence. No evidence of pneumothorax.  Gastric tube again projects over the mediastinum, terminating out of the field of view. The gastric port projects over the region of the stomach.  Endotracheal tube is unchanged terminating suitably above the carina.  IMPRESSION: Interval placement of left IJ central venous catheter, which terminates at the confluence of the superior vena cava and left brachycephalic vein. No evidence of pneumothorax.  Otherwise unchanged support apparatus.  Similar appearance of mixed interstitial and airspace disease, compatible with edema.  Improved aeration at the bilateral lung bases.  Signed,  Yvone NeuJaime S. Loreta AveWagner, DO  [facility]  [facility]   Electronically Signed   By: Gilmer MorJaime  Wagner D.O.   On: 05/08/2014 17:30      VHQ:[id number]:unchanged   ASSESSMENT / PLAN:  PULMONARY A: Acute respiratory failure s/p cardiopulmonary arrest.  Aspiration PNA Diuresed very well  overnight P:   Begin PS trials today but no extubation given mental status See Neuro section. PAD protocol for RAS goal -2 See ID section     CARDIOVASCULAR A:  VF cardiac arrest Previously documented NICM: EF 30-35%  Chronic LBBB Sinus bradycardia Ventricular ectopy Cardiogenic shock-->shock resolved  P:  Tele If awaken, will need icd Cont coreg, isosorbide and lipitor  Negative fluid balance goal  Hold further diureses at this time.  RENAL A:   CRI w/ acute on chronic injury  scr rising after diuresis  Hypernatremia  Hypokalemia   P:   Chem in am KVO IVF, hold off on lasix for 11/18 Free water Replace electrolytes as indicated.  GASTROINTESTINAL A:   Npo 11/14, hep c? Diet  Ammonia 61 on 11/17 P:   SUP: IV PPI Tf to goal Hep C Ab reactive. Continue lactulose given high ammonia   HEMATOLOGIC A:   Mild anemia  P:  DVT px: SQ heparin Monitor CBC intermittently Transfuse per usual ICU guidelines  INFECTIOUS A:   Aspiration PNA  P:   Trend PCT  Unasyn 11/16>>>11/17 Rocephin 11/17>>>11/22 Sputum 11/16>>>NTD BC 11/12>>>NTD  ENDOCRINE A:   DM 2 P:   Cont SSI  NEUROLOGIC A:   Post anoxic encephalopathy H/o cocaine, methadone (active?) P:   RASS goal: -2 Continue propofol for today, will change to versed in AM. Fentanyl drip Continue clonazepam and seroquel  Keep collar on until can clear clinicaly Neuro consulted and following   FAMILY  - Updated sister at bedside on 11/17  Propofol continued for now, will change to versed and fentanyl in AM given persistent agitation and poor efficacy of precedex on patient.  Communicated with neuro, would like to allow more time for the patient to declare himself.  Spoke with sister who will communicate with the patient's children to determine if trach/peg is something they think the patient would want.  My CC time of 35 min.  Alyson ReedyWesam G. Tyiana Hill, M.D. [facility]. Pager:  [phone]. After hours pager: [phone].

## 2014-05-10 NOTE — Clinical Documentation Improvement (Signed)
Supporting Information: Number needed to harm hypothermia 12-14 PNA / sepsis per 11/15 progress notes.  Labs: 11/12: lactic acid: 7.64.   Possible Diagnosis? . Bacteremia (positive blood cultures only) . Sepsis-specify causative organism if known . Sepsis due to: --Device --Implant --Graft --Infusion . Severe sepsis-sepsis with organ dysfunction --Specify organ dysfunction Respiratory failure Encephalopathy Acute kidney failure Other (specify) . SIRS (Systemic Inflammatory Response Syndrome --With or without organ dysfunction . Document septic shock if present . Document any associated diagnoses/conditions   Thank Gabriel Cirri Documentation Specialist [phone] Viviann [email or web address]

## 2014-05-11 ENCOUNTER — Inpatient Hospital Stay (HOSPITAL_COMMUNITY): Payer: PRIVATE HEALTH INSURANCE

## 2014-05-11 LAB — GLUCOSE, CAPILLARY
GLUCOSE-CAPILLARY: 153 mg/dL — AB (ref 70–99)
GLUCOSE-CAPILLARY: 102 mg/dL — AB (ref 70–99)
Glucose-Capillary: 105 mg/dL — ABNORMAL HIGH (ref 70–99)
Glucose-Capillary: 112 mg/dL — ABNORMAL HIGH (ref 70–99)
Glucose-Capillary: 154 mg/dL — ABNORMAL HIGH (ref 70–99)
Glucose-Capillary: 156 mg/dL — ABNORMAL HIGH (ref 70–99)
Glucose-Capillary: 157 mg/dL — ABNORMAL HIGH (ref 70–99)

## 2014-05-11 LAB — BASIC METABOLIC PANEL
Anion gap: 11 (ref 5–15)
BUN: 19 mg/dL (ref 6–23)
CO2: 24 meq/L (ref 19–32)
CREATININE: 1.68 mg/dL — AB (ref 0.50–1.35)
Calcium: 8.1 mg/dL — ABNORMAL LOW (ref 8.4–10.5)
Chloride: 118 mEq/L — ABNORMAL HIGH (ref 96–112)
GFR calc Af Amer: 46 mL/min — ABNORMAL LOW (ref >90)
GFR, EST NON AFRICAN AMERICAN: 40 mL/min — AB (ref >90)
GLUCOSE: 151 mg/dL — AB (ref 70–99)
Potassium: 4.3 mEq/L (ref 3.7–5.3)
Sodium: 153 mEq/L — ABNORMAL HIGH (ref 137–147)

## 2014-05-11 LAB — CBC
HCT: 34.4 % — ABNORMAL LOW (ref 39.0–52.0)
HEMOGLOBIN: 10.8 g/dL — AB (ref 13.0–17.0)
MCH: 26.2 pg (ref 26.0–34.0)
MCHC: 31.4 g/dL (ref 30.0–36.0)
MCV: 83.5 fL (ref 78.0–100.0)
Platelets: 197 10*3/uL (ref 150–400)
RBC: 4.12 MIL/uL — AB (ref 4.22–5.81)
RDW: 16.5 % — ABNORMAL HIGH (ref 11.5–15.5)
WBC: 10.8 10*3/uL — ABNORMAL HIGH (ref 4.0–10.5)

## 2014-05-11 LAB — CULTURE, BLOOD (ROUTINE X 2)
Culture: NO GROWTH
Culture: NO GROWTH

## 2014-05-11 LAB — BLOOD GAS, ARTERIAL
Acid-base deficit: 0.4 mmol/L (ref 0.0–2.0)
BICARBONATE: 25.2 meq/L — AB (ref 20.0–24.0)
Drawn by: 41308
FIO2: 40 %
LHR: 12 {breaths}/min
O2 Saturation: 97.4 %
PEEP/CPAP: 5 cmH2O
Patient temperature: 98.6
TCO2: 26.9 mmol/L (ref 0–100)
VT: 620 mL
pCO2 arterial: 53.1 mmHg — ABNORMAL HIGH (ref 35.0–45.0)
pH, Arterial: 7.298 — ABNORMAL LOW (ref 7.350–7.450)
pO2, Arterial: 98.6 mmHg (ref 80.0–100.0)

## 2014-05-11 LAB — MAGNESIUM: Magnesium: 2.3 mg/dL (ref 1.5–2.5)

## 2014-05-11 LAB — PROCALCITONIN: PROCALCITONIN: 0.99 ng/mL

## 2014-05-11 LAB — PHOSPHORUS: Phosphorus: 3.7 mg/dL (ref 2.3–4.6)

## 2014-05-11 MED ORDER — QUETIAPINE FUMARATE 200 MG PO TABS
200.0000 mg | ORAL_TABLET | Freq: Two times a day (BID) | ORAL | Status: DC
Start: 2014-05-11 — End: 2014-05-12
  Administered 2014-05-11 – 2014-05-12 (×2): 200 mg
  Filled 2014-05-11 (×3): qty 1

## 2014-05-11 MED ORDER — ISOSORB DINITRATE-HYDRALAZINE 20-37.5 MG PO TABS
2.0000 | ORAL_TABLET | Freq: Two times a day (BID) | ORAL | Status: DC
  Administered 2014-05-11 – 2014-05-14 (×6): 2 via ORAL
  Filled 2014-05-11 (×7): qty 2

## 2014-05-11 MED ORDER — FUROSEMIDE 10 MG/ML IJ SOLN
40.0000 mg | Freq: Three times a day (TID) | INTRAMUSCULAR | Status: DC
  Administered 2014-05-11: 40 mg via INTRAVENOUS
  Filled 2014-05-11: qty 4

## 2014-05-11 MED ORDER — SODIUM CHLORIDE 0.9 % IV SOLN: INTRAVENOUS | Status: DC

## 2014-05-11 MED ORDER — MIDAZOLAM HCL 5 MG/ML IJ SOLN
2.0000 mg/h | INTRAMUSCULAR | Status: DC
  Administered 2014-05-11 (×2): 2 mg/h via INTRAVENOUS
  Administered 2014-05-12: 1 mg/h via INTRAVENOUS
  Administered 2014-05-12 – 2014-05-14 (×2): 2 mg/h via INTRAVENOUS
  Filled 2014-05-11 (×3): qty 10

## 2014-05-11 MED ORDER — FREE WATER
300.0000 mL | Status: DC
  Administered 2014-05-11: 300 mL

## 2014-05-11 MED ORDER — FUROSEMIDE 10 MG/ML IJ SOLN
40.0000 mg | Freq: Three times a day (TID) | INTRAMUSCULAR | Status: AC
  Administered 2014-05-11: 40 mg via INTRAVENOUS
  Filled 2014-05-11: qty 4

## 2014-05-11 MED ORDER — MIDAZOLAM HCL 2 MG/2ML IJ SOLN
1.0000 mg | INTRAMUSCULAR | Status: DC | PRN
  Administered 2014-05-12 – 2014-05-14 (×4): 2 mg via INTRAVENOUS
  Filled 2014-05-11: qty 2

## 2014-05-11 MED ORDER — VANCOMYCIN HCL 10 G IV SOLR
1500.0000 mg | Freq: Once | INTRAVENOUS | Status: AC
  Administered 2014-05-11: 1500 mg via INTRAVENOUS
  Filled 2014-05-11: qty 1500

## 2014-05-11 MED ORDER — VANCOMYCIN HCL IN DEXTROSE 750-5 MG/150ML-% IV SOLN
750.0000 mg | Freq: Two times a day (BID) | INTRAVENOUS | Status: DC
  Administered 2014-05-11 – 2014-05-13 (×4): 750 mg via INTRAVENOUS
  Filled 2014-05-11 (×6): qty 150

## 2014-05-11 MED ORDER — CEFTRIAXONE SODIUM IN DEXTROSE 20 MG/ML IV SOLN
1.0000 g | INTRAVENOUS | Status: AC
  Administered 2014-05-11 – 2014-05-13 (×3): 1 g via INTRAVENOUS
  Filled 2014-05-11 (×3): qty 50

## 2014-05-11 MED ORDER — FREE WATER
300.0000 mL | Status: DC
  Administered 2014-05-11 – 2014-05-14 (×17): 300 mL

## 2014-05-11 MED ORDER — FENTANYL BOLUS VIA INFUSION
50.0000 ug | INTRAVENOUS | Status: DC | PRN
  Administered 2014-05-11 – 2014-05-14 (×11): 50 ug via INTRAVENOUS
  Filled 2014-05-11: qty 50

## 2014-05-11 NOTE — Progress Notes (Signed)
ANTIBIOTIC CONSULT NOTE - INITIAL  Pharmacy Consult for Vancomycin Indication: MRSA PNA  No Known Allergies  Patient Measurements: Height: 6' (182.9 cm) Weight: 197 lb 15.6 oz (89.8 kg) IBW/kg (Calculated) : 77.6  Vital Signs: Temp: 99.1 F (37.3 C) (11/19 0900) Temp Source: Core (Comment) (11/19 0900) BP: 156/61 mmHg (11/19 0900) Pulse Rate: 82 (11/19 0900) Intake/Output from previous day: 11/18 0701 - 11/19 0700 In: 3511.5 [I.V.:1111.5; NG/GT:2305; IV Piggyback:55] Out: 2840 [Urine:2240; Stool:600] Intake/Output from this shift: Total I/O In: 72.4 [I.V.:72.4] Out: 180 [Urine:180]  Labs:  Recent Labs  05/09/14 0500 05/10/14 0335 05/11/14 0350  WBC 12.3* 11.9* 10.8*  HGB 10.8* 10.7* 10.8*  PLT 177 188 197  CREATININE 1.92* 2.07* 1.68*   Estimated Creatinine Clearance: 44.9 mL/min (by C-G formula based on Cr of 1.68). No results for input(s): VANCOTROUGH, VANCOPEAK, VANCORANDOM, GENTTROUGH, GENTPEAK, GENTRANDOM, TOBRATROUGH, TOBRAPEAK, TOBRARND, AMIKACINPEAK, AMIKACINTROU, AMIKACIN in the last 72 hours.   Microbiology: Recent Results (from the past 720 hour(s))  Blood culture (routine x 2)     Status: None   Collection Time: 05/04/14  5:35 PM  Result Value Ref Range Status   Specimen Description BLOOD  Final   Special Requests   Final    BOTTLES DRAWN AEROBIC AND ANAEROBIC 5CC RT SUBCLAVIAN   Culture  Setup Time   Final    05/05/2014 01:30 Performed at [facility]    Culture   Final    NO GROWTH 5 DAYS Performed at [facility]    Report Status 05/11/2014 FINAL  Final  MRSA PCR Screening     Status: None   Collection Time: 05/04/14  7:39 PM  Result Value Ref Range Status   MRSA by PCR NEGATIVE NEGATIVE Final    Comment:        The GeneXpert MRSA Assay (FDA approved for NASAL specimens only), is one component of a comprehensive MRSA colonization surveillance program. It is not intended to diagnose MRSA infection nor to guide  or monitor treatment for MRSA infections.   Blood culture (routine x 2)     Status: None   Collection Time: 05/04/14  9:00 PM  Result Value Ref Range Status   Specimen Description BLOOD LEFT ARM  Final   Special Requests BOTTLES DRAWN AEROBIC ONLY 2CC  Final   Culture  Setup Time   Final    05/05/2014 04:08 Performed at [facility]    Culture   Final    NO GROWTH 5 DAYS Performed at [facility]    Report Status 05/11/2014 FINAL  Final  Culture, respiratory (NON-Expectorated)     Status: None (Preliminary result)   Collection Time: 05/07/14  4:40 PM  Result Value Ref Range Status   Specimen Description TRACHEAL ASPIRATE  Final   Special Requests NONE  Final   Gram Stain   Final    ABUNDANT WBC PRESENT,BOTH PMN AND MONONUCLEAR RARE SQUAMOUS EPITHELIAL CELLS PRESENT MODERATE GRAM NEGATIVE COCCOBACILLI FEW GRAM POSITIVE COCCI IN PAIRS RARE GRAM NEGATIVE COCCI    Culture   Final    MODERATE METHICILLIN RESISTANT STAPHYLOCOCCUS AUREUS Note: RIFAMPIN AND GENTAMICIN SHOULD NOT BE USED AS SINGLE DRUGS FOR TREATMENT OF STAPH INFECTIONS. This organism DOES NOT demonstrate inducible Clindamycin resistance in vitro. CRITICAL RESULT CALLED TO, READ BACK BY AND VERIFIED WITH: SARA@7 :22AM ON  05/11/14 BY DANTS Performed at [facility]    Report Status PENDING  Incomplete   Organism ID, Bacteria METHICILLIN RESISTANT STAPHYLOCOCCUS AUREUS  Final  Susceptibility   Methicillin resistant staphylococcus aureus - MIC*    CLINDAMYCIN <=0.25 SENSITIVE Sensitive     ERYTHROMYCIN >=8 RESISTANT Resistant     GENTAMICIN <=0.5 SENSITIVE Sensitive     LEVOFLOXACIN 0.25 SENSITIVE Sensitive     OXACILLIN >=4 RESISTANT Resistant     PENICILLIN >=0.5 RESISTANT Resistant     RIFAMPIN <=0.5 SENSITIVE Sensitive     TRIMETH/SULFA <=10 SENSITIVE Sensitive     VANCOMYCIN 1 SENSITIVE Sensitive     TETRACYCLINE <=1 SENSITIVE Sensitive     * MODERATE METHICILLIN RESISTANT  STAPHYLOCOCCUS AUREUS  Clostridium Difficile by PCR     Status: None   Collection Time: 05/10/14  1:13 PM  Result Value Ref Range Status   C difficile by pcr NEGATIVE NEGATIVE Final    Medical History: Past Medical History  Diagnosis Date  . CHF (congestive heart failure)     EF 30-35%, nonischemic  . CAD (coronary artery disease)   . Hyperlipidemia     hypertriglyceridemia on niaspan and lipitor.  . Asthma   . Allergy   . PVD (peripheral vascular disease)     s/p left AKA @ [facility] (MRSA septic arthritis)  . Diabetes mellitus     dx in 2009, DKA, came unresponsive  . Tobacco abuse   . Hepatitis C   . Hypertension   . LBBB (left bundle branch block)   . Anemia of chronic disease     baseline Hgb now 13  . Thrombocytopenia     07/2007, went down to 90,000. likely 2/2 neurontin.  . Cocaine use     s/p cardiopulmonary arrest 12/25/2002 ([facility])  . Cholelithiasis     documented on CT abdomen in 07/2007    Assessment: 68 yo M presents on 11/12 in cardiac arrest and respiratory arrest. Post hypothermia protocol, waiting for neuro to be assessed. Resp. cx grew MRSA. Had possible aspiration on 11/15, so Unasyn was started, then switched to Rocephin. Afebrile since two nights ago. WBC has been trending down throughout stay, now at 10.8. SCr trending down to 1.68, CrCl ~70ml/min.  Goal of Therapy:  Eradication of infection  Plan:  D/C Rocephin Vancomycin 1500mg  IV x 1 now Vancomycin 750mg  IV Q12 starting tonight Monitor renal function, CBC, temp, cx's, VT at ss  Eleana Tocco J 05/11/2014,9:30 AM

## 2014-05-11 NOTE — Progress Notes (Signed)
Pt transported to MRI on vent and placed back to full support.

## 2014-05-11 NOTE — Progress Notes (Signed)
Subjective: Continues to be agitated  Exam: Filed Vitals:   05/11/14 0748  BP: 152/55  Pulse: 73  Temp:   Resp: 14   Gen: In bed, NAD MS: Does not open eyes. He does not reliably follow commands today  BS:WHQPR, corneals intact, eyes dysconjugate tdoya with slight left eye outward deviation Motor: moves all extremities with flailing movements, possibly moving r < L  Sensory:as above   Impression: 68 yo M with persistent encephalopathy following cardiac arrest. I suspect a multifactorial delirium including some degree of hypoxic insult. He has some assymetry to his movements, and therefore MRI may be helpful.   Recommendations: 1) depakote 500mg  BID  Ritta Slot, MD [facility] [phone]  If 7pm- 7am, please page neurology on call as listed in AMION.

## 2014-05-11 NOTE — Progress Notes (Signed)
SUBJECTIVE:  Pt seen and examined in AM. He remains intubated and has intermittent agitation. He has asymmetric movement and MRI brain is planned for today.  His telemetry reveals chronic BBB. His blood pressure and heart rate remain stable. His CXR today shows improved pneumonia and edema. He is positive for MRSA in tracheal aspirate and currently on vancomycin.    OBJECTIVE:   Vitals:   Filed Vitals:   05/11/14 0748 05/11/14 0800 05/11/14 0900 05/11/14 1000  BP: 152/55 176/79 156/61 166/56  Pulse: 73 80 82 81  Temp:  98.6 F (37 C) 99.1 F (37.3 C) 98.8 F (37.1 C)  TempSrc:  Core (Comment) Core (Comment)   Resp: 14 19 17 17   Height:      Weight:      SpO2: 100% 100% 98% 97%   I&O's:    Intake/Output Summary (Last 24 hours) at 05/11/14 1018 Last data filed at 05/11/14 0900  Gross per 24 hour  Intake 3223.45 ml  Output   2820 ml  Net 403.45 ml   TELEMETRY: Reviewed telemetry pt in BBB and SB:     PHYSICAL EXAM General: Intubated and sedated Head:   Large abrasion/contusion on right side. C-collar in place.   Lungs:  Intubated. Ronchi in anterior fields. Heart:  HRRR S1 S2  No JVD.   Abdomen: Abdomen soft and non-tender Msk:  Left AKA   Extremities:  SCD's. No edema of RLE. Neuro: Sedated  Psych:  Sedated Skin: No rash   LABS: Basic Metabolic Panel:  Recent Labs  78/29/56 0500 05/10/14 0335 05/11/14 0350  NA 149* 153* 153*  K 3.1* 3.6* 4.3  CL 111 116* 118*  CO2 27 26 24   GLUCOSE 129* 145* 151*  BUN 25* 24* 19  CREATININE 1.92* 2.07* 1.68*  CALCIUM 8.3* 8.4 8.1*  MG 2.2  --  2.3  PHOS 3.0  --  3.7   Liver Function Tests:  Recent Labs  05/10/14 0335  AST 34  ALT 20  ALKPHOS 52  BILITOT 0.3  PROT 5.8*  ALBUMIN 2.2*   No results for input(s): LIPASE, AMYLASE in the last 72 hours. CBC:  Recent Labs  05/10/14 0335 05/11/14 0350  WBC 11.9* 10.8*  HGB 10.7* 10.8*  HCT 33.6* 34.4*  MCV 82.2 83.5  PLT 188 197   Cardiac Enzymes: No  results for input(s): CKTOTAL, CKMB, CKMBINDEX, TROPONINI in the last 72 hours. BNP: Invalid input(s): POCBNP D-Dimer: No results for input(s): DDIMER in the last 72 hours. Hemoglobin A1C:  Recent Labs  05/09/14 0500  HGBA1C 6.9*   Fasting Lipid Panel:  Recent Labs  05/09/14 0901  TRIG 120   Thyroid Function Tests: No results for input(s): TSH, T4TOTAL, T3FREE, THYROIDAB in the last 72 hours.  Invalid input(s): FREET3 Anemia Panel: No results for input(s): VITAMINB12, FOLATE, FERRITIN, TIBC, IRON, RETICCTPCT in the last 72 hours. Coag Panel:   Lab Results  Component Value Date   INR 1.23 05/05/2014   INR 1.23 05/04/2014   INR 1.10 06/20/2010    RADIOLOGY: Ct Head Wo Contrast  05/09/2014   CLINICAL DATA:  Cerebral edema.  Status post cardiac arrest.  EXAM: CT HEAD WITHOUT CONTRAST  TECHNIQUE: Contiguous axial images were obtained from the base of the skull through the vertex without intravenous contrast.  COMPARISON:  CT scan of May 04, 2014.  FINDINGS: Bony calvarium appears intact. Bilateral ethmoid and sphenoid and left maxillary sinusitis is noted. Mild diffuse cortical atrophy is noted. Mild  chronic ischemic white matter disease is noted. No mass effect or midline shift is noted. Ventricular size is within normal limits. There is no evidence of mass lesion, hemorrhage or acute infarction.  IMPRESSION: Mild diffuse cortical atrophy. Mild chronic ischemic white matter disease. Bilateral ethmoid and sphenoid sinusitis. No acute intracranial abnormality seen.   Electronically Signed   By: Roque Lias M.D.   On: 05/09/2014 15:17   Ct Head Wo Contrast  05/04/2014   CLINICAL DATA:  Syncopal episode. Patient found down. Out of hospital cardiac arrest.  EXAM: CT HEAD WITHOUT CONTRAST  CT CERVICAL SPINE WITHOUT CONTRAST  TECHNIQUE: Multidetector CT imaging of the head and cervical spine was performed following the standard protocol without intravenous contrast. Multiplanar CT  image reconstructions of the cervical spine were also generated.  COMPARISON:  Prior CT head from 08/10/2007.  FINDINGS: CT HEAD FINDINGS  No evidence for acute infarction, hemorrhage, mass lesion, hydrocephalus, or extra-axial fluid. Mild cerebral and cerebellar atrophy. Hypoattenuation of white matter is consistent with chronic microvascular ischemic change.  The calvarium is intact. There is no sinus or mastoid air fluid level although the ethmoids are opacified. Likely post intubation. The patient is intubated, with accompanying nasopharyngeal fluid.  CT CERVICAL SPINE FINDINGS  There is no visible cervical spine fracture, traumatic subluxation, prevertebral soft tissue swelling, or intraspinal hematoma. Mild straightening of the normal cervical lordosis. Mild ossification of the posterior longitudinal ligament. Multilevel disc space narrowing from C2 through T1. Moderately advanced facet arthropathy at C2-3 on the LEFT. Vascular calcification. Central venous line. Endotracheal tube. No pneumothorax. No worrisome neck mass.  IMPRESSION: Chronic changes as described. Similar appearance to 2009. No acute intracranial findings.  No cervical spine fracture or traumatic subluxation. Advanced multilevel cervical spondylosis is noted.  Visualized support tubes and lines appear appropriately placed.  Findings discussed with consulting MD.   Electronically Signed   By: Davonna Belling M.D.   On: 05/04/2014 18:26   Ct Cervical Spine Wo Contrast  05/04/2014   CLINICAL DATA:  Syncopal episode. Patient found down. Out of hospital cardiac arrest.  EXAM: CT HEAD WITHOUT CONTRAST  CT CERVICAL SPINE WITHOUT CONTRAST  TECHNIQUE: Multidetector CT imaging of the head and cervical spine was performed following the standard protocol without intravenous contrast. Multiplanar CT image reconstructions of the cervical spine were also generated.  COMPARISON:  Prior CT head from 08/10/2007.  FINDINGS: CT HEAD FINDINGS  No evidence for  acute infarction, hemorrhage, mass lesion, hydrocephalus, or extra-axial fluid. Mild cerebral and cerebellar atrophy. Hypoattenuation of white matter is consistent with chronic microvascular ischemic change.  The calvarium is intact. There is no sinus or mastoid air fluid level although the ethmoids are opacified. Likely post intubation. The patient is intubated, with accompanying nasopharyngeal fluid.  CT CERVICAL SPINE FINDINGS  There is no visible cervical spine fracture, traumatic subluxation, prevertebral soft tissue swelling, or intraspinal hematoma. Mild straightening of the normal cervical lordosis. Mild ossification of the posterior longitudinal ligament. Multilevel disc space narrowing from C2 through T1. Moderately advanced facet arthropathy at C2-3 on the LEFT. Vascular calcification. Central venous line. Endotracheal tube. No pneumothorax. No worrisome neck mass.  IMPRESSION: Chronic changes as described. Similar appearance to 2009. No acute intracranial findings.  No cervical spine fracture or traumatic subluxation. Advanced multilevel cervical spondylosis is noted.  Visualized support tubes and lines appear appropriately placed.  Findings discussed with consulting MD.   Electronically Signed   By: Davonna Belling M.D.   On: 05/04/2014 18:26  Dg Chest Port 1 View  05/11/2014   CLINICAL DATA:  Intubated patient with history of shortness of breath and pneumonia  EXAM: PORTABLE CHEST - 1 VIEW  COMPARISON:  Portable chest x-ray of May 10, 2014  FINDINGS: The lungs are well-expanded. The interstitial markings have improved dramatically. Minimal increased density persists in the retrocardiac region on the left. The cardiac silhouette remains enlarged. The pulmonary vascularity is more normal today. There is no significant pleural effusion. There is a stable appearance of the destructive lesion of the lateral aspect of the right fifth rib.  The endotracheal tube tip lies 2.9 cm above the crotch of the  carina. The esophagogastric tube tip projects below the inferior margin of the image. The the observed bony thorax is unremarkable. Left internal jugular venous catheter tip projects over the proximal SVC.  IMPRESSION: Dramatic improvement in the appearance of the lungs since yesterday's study consistent with resolving interstitial edema and/or pneumonia. Persistent left lower lobe atelectasis or pneumonia is present. Abnormal contour of the lateral aspect of the right fifth rib is unchanged.   Electronically Signed   By: David  [place]   On: 05/11/2014 07:36   Dg Chest Port 1 View  05/10/2014   CLINICAL DATA:  68 year old male with shortness of breath and pneumonia. Initial encounter.  EXAM: PORTABLE CHEST - 1 VIEW  COMPARISON:  05/09/2014 and earlier.  FINDINGS: Portable AP semi upright view at 0438 hrs. Stable endotracheal tube. Stable left IJ central line. Stable visualized enteric tube.  Continued dense retrocardiac opacity and veiling opacity at both lung bases. Stable pulmonary vascularity. No pneumothorax. The patient is more rotated to the right.  Evidence of right lateral fourth or fifth rib destruction (arrow). The right lateral ribs were normal on 08/10/2007 chest CT. No other acute osseous abnormality identified.  IMPRESSION: 1.  Stable lines and tubes. 2. Stable ventilation with bilateral pleural effusions and lower lobe collapse or consolidation. 3. Destruction versus fracture of the right lateral fourth or fifth rib (arrow). Has there been recent CPR or is there a known malignancy?   Electronically Signed   By: Augusto Gamble M.D.   On: 05/10/2014 06:20   Dg Chest Port 1 View  05/09/2014   CLINICAL DATA:  Assess ET tube position.  EXAM: PORTABLE CHEST - 1 VIEW  COMPARISON:  05/08/2014  FINDINGS: The endotracheal tube is 5.3 cm above the carina. The NG tube is stable. The left IJ catheter is stable. Persistent cardiac enlargement and vascular congestion. Small pleural effusions and bibasilar  atelectasis.  IMPRESSION: Stable support apparatus.  Persistent vascular congestion, mild edema, small effusions and bibasilar atelectasis.   Electronically Signed   By: Loralie Champagne M.D.   On: 05/09/2014 07:40   Dg Chest Port 1 View  05/08/2014   CLINICAL DATA:  68 year old male status post central line placement.  EXAM: PORTABLE CHEST - 1 VIEW  COMPARISON:  05/08/2014, 5:08 a.m.  FINDINGS: Cardiomediastinal silhouette unchanged in size and contour. Fullness of the central vasculature again evident.  Diffuse interstitial and airspace opacities. Improved aeration at the bases of the lungs.  Interval placement of left IJ central venous catheter, which terminates in the region of the left brachycephalic vein and superior vena cava confluence. No evidence of pneumothorax.  Gastric tube again projects over the mediastinum, terminating out of the field of view. The gastric port projects over the region of the stomach.  Endotracheal tube is unchanged terminating suitably above the carina.  IMPRESSION: Interval placement of  left IJ central venous catheter, which terminates at the confluence of the superior vena cava and left brachycephalic vein. No evidence of pneumothorax.  Otherwise unchanged support apparatus.  Similar appearance of mixed interstitial and airspace disease, compatible with edema.  Improved aeration at the bilateral lung bases.  Signed,  Yvone Neu. Loreta Ave, DO  [facility]  [facility]   Electronically Signed   By: Gilmer Mor D.O.   On: 05/08/2014 17:30   Dg Chest Port 1 View  05/08/2014   CLINICAL DATA:  Ventilator dependent respiratory failure. Acute respiratory acidosis. Shortness of breath. Prior history of CHF.  EXAM: PORTABLE CHEST - 1 VIEW  COMPARISON:  Portable chest x-rays yesterday dating back to 05/04/2014.  FINDINGS: Endotracheal tube tip in satisfactory position projecting approximately 3 cm above the carina. Nasogastric tube courses  below the diaphragm into the stomach. Cardiac silhouette moderately to markedly enlarged but stable. Interval development of mild-to-moderate diffuse interstitial and airspace pulmonary edema since yesterday. Developing bilateral pleural effusions and associated dense passive atelectasis in the lower lobes.  IMPRESSION: 1. Support apparatus satisfactory. 2. New moderate CHF, with interstitial and airspace pulmonary edema and moderate-sized bilateral pleural effusions. Associated passive atelectasis in the lower lobes.   Electronically Signed   By: Hulan Saas M.D.   On: 05/08/2014 07:43   Dg Chest Port 1 View  05/07/2014   CLINICAL DATA:  Cardiac arrest, history of asthma  EXAM: PORTABLE CHEST - 1 VIEW  COMPARISON:  05/06/2014  FINDINGS: Cardiomediastinal silhouette is stable. Endotracheal tube in place with tip 4.5 cm above the carina. Stable NG tube position. Mild basilar atelectasis again noted. No segmental infiltrate or pulmonary edema  IMPRESSION: Stable support apparatus. Again noted mild basilar atelectasis. No segmental infiltrate or pulmonary edema.   Electronically Signed   By: Natasha Mead M.D.   On: 05/07/2014 11:22   Dg Chest Port 1 View  05/06/2014   CLINICAL DATA:  Respiratory failure  EXAM: PORTABLE CHEST - 1 VIEW  COMPARISON:  05/05/1949  FINDINGS: An endotracheal tube is again noted 2.8 cm above the carina. A nasogastric catheter is noted within the stomach. The cardiac shadow remains enlarged. Mild bibasilar atelectatic changes are seen. No significant vascular congestion is noted.  IMPRESSION: Mild bibasilar atelectatic changes.  Tubes and lines as described.   Electronically Signed   By: Alcide Clever M.D.   On: 05/06/2014 07:22   Dg Chest Port 1 View  05/05/2014   CLINICAL DATA:  Evaluate airspace disease  EXAM: PORTABLE CHEST - 1 VIEW  COMPARISON:  05/04/2014  FINDINGS: Endotracheal tube tip between the clavicular heads and carina. A gastric suction tube enters the stomach.  Right subclavian central line, malpositioned on the previous study, has been removed.  Unchanged cardiomegaly.  Stable aortic contours.  Pulmonary venous congestion has decreased but there is now hazy and streaky bibasilar opacities. No pneumothorax.  IMPRESSION: 1. New orogastric tube is in good position. 2. Improved pulmonary edema. 3. Worsening basilar aeration, likely layering small effusions and atelectasis. Pneumonia cannot be excluded.   Electronically Signed   By: Tiburcio Pea M.D.   On: 05/05/2014 08:00   Dg Chest Portable 1 View  05/04/2014   CLINICAL DATA:  Status post cardiac arrest and CPR.  Intubated.  EXAM: PORTABLE CHEST - 1 VIEW  COMPARISON:  06/13/2010.  FINDINGS: Interval endotracheal tube with its tip 3.6 cm above the carina. Breast of enlargement of the cardiac silhouette with increased prominence of the  pulmonary vasculature and interstitial markings. No pleural fluid seen. No pneumothorax. An interval epicardial pacemaker lead is in place. Right subclavian catheter extending into the right neck, its tip not included.  IMPRESSION: 1. Malpositioned right subclavian catheter extending into the neck on the right. 2. Progressive cardiomegaly and changes of congestive heart failure. These results will be called to the ordering clinician or representative by the Radiologist Assistant, and communication documented in the PACS or zVision Dashboard.   Electronically Signed   By: Gordan Payment M.D.   On: 05/04/2014 17:47   Dg Abd Portable 1v  05/04/2014   CLINICAL DATA:  Orogastric tube placement.  EXAM: PORTABLE ABDOMEN - 1 VIEW  COMPARISON:  08/11/2007.  FINDINGS: Orogastric tube tip in the mid to distal stomach. The visualized bowel gas pattern is normal. The patient is rotated to the right. Possible hiatal hernia and right basilar airspace opacity.  IMPRESSION: 1. Orogastric tube tip in the mid to distal stomach. 2. Possible hiatal hernia. 3. Mild patchy atelectasis or pneumonia at the right  lung base.   Electronically Signed   By: Gordan Payment M.D.   On: 05/04/2014 21:39   Principal Problem:   Cardiac arrest Active Problems:   Chronic hepatitis C   DM2 (diabetes mellitus, type 2)   Essential hypertension   Peripheral vascular disease   VF (ventricular fibrillation)   Nonischemic cardiomyopathy   Hx of AKA (above knee amputation)   Acute respiratory acidosis   Respiratory failure   Cerebral edema   Anoxic brain injury   Pneumonia     ASSESSMENT: 68 yo man with pmh of non-ishemic cardiomyapthy with EF 30-35% on echo in 2011, HTN, HL, PVD, Type II DM s/p left AKA, past cocaine abuse, tobacco abuse, and chronic LBBB who presented with vfib arrest with ROSC found to have non-obstructive catherization.   PLAN:   Vfib Arrest s/p Non-obstructive cardiac catherization -  Pt s/p non-obstructive cardiac catherization and  hypothermic protocol. Etiology unclear, possible due to non-ischemic cardiomyopathy in setting of chronic LBBB. Will consider ICD pending neurological status. EEG with hypoxic encephalopathy. CT head with no acute changes. MRI brain scheduled for today. Per neurology will need to observe for at least 1 week post-rewarming for prognosis.  Ventilator Dependent Respiratory Failure in setting of probable MRSA PNA- Currently intubated. On IV ceftriaxone day 5 for aspiration PNA, transitioned to vancomycin for MRSA on tracheal aspirat. CXR with improvement of pneumonia.  Management per PCCM.  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 Also with grade 1 diastolic dysfunction. Pt with pmh of cocaine abuse. Consider ICD pending neurological status. CXR with improved edema. Pt at home on lasix 80 mg BID and enalapril 20 mg BID that are currently held due to AKI.  Hypernatremia - Na 153 today. He received 3 days of unasyn with saline. He is on free water per tube, consider increasing.  Hypertension - Currently  hypertensive. Currently on carvedilol 12.5 mg BID and bidil 20-37.5 mg BID. Pt at home on amlodipine 10 mg daily, enalapril 20 mg BID, lasix 80 mg BID, and coreg 25 mg BID.  Acute Normocytic Anemia - Hg stable at 10.8 below baseline 14. Obtain FOBT. UA with trace hematuria. Currently on SQ heparin for DVT ppx. Monitor for bleeding.  Nonoliguric AKI CKD Stage 3 - Cr improved to 1.68 from 2.07, above baseline 1.5. Most likely due to recent IV lasix with hypotension. Etiology pre-renal due  to volume depletion vs ischemic ATN. He is on free water per tube. Hold home  lasix 80 mg BID and enalapril 20 mg BID. Continue to monitor.  Hypokalemia - K 3.6 this AM.  Was replaced with KCL this AM. Continue to monitor.  Acute Encepalopathy - On seroquel 100 mg BID, clonazepam 1 mg BID, and depakote 500 mg BID per neurology. To have MRI brain today. Multifactorial per neurology. Hyperlipidemia - Continue home liptor 10 mg daily.  PVD - Pt at home on aspirin 81 mg daily. Hold in setting of acute anemia.  Non-Insulin Dependent Type II DM - A1c 6.9.  Pt is s/p left AKA. Hold home metformin and glipizide. Pt is on moderate SSI.  Hepatitis C Infection - HCV viral load 4,540,9814,161,240.  Normal AST/ALT with chronic hypoalbuminemia. Anti-sHBV negative indicating no prior HBC vaccination.     Otis BraceABBANI, Maniah Nading, MD  PGY-II IMTS Pager [phone] 05/11/2014  10:18 AM  Attending note by Dr. Katrinka BlazingSmith to follow.

## 2014-05-11 NOTE — Progress Notes (Signed)
No real changes or cardiology action items. We continue to follow.

## 2014-05-11 NOTE — Progress Notes (Addendum)
CRITICAL VALUE ALERT  Critical value received:  Positive MRSA in tracheal aspirate  Date of notification:  05/11/2014  Time of notification:  0721  Critical value read back:Yes.    Nurse who received alert:  Dub Amis  MD notified (1st page):  Dr Delton Coombes-- MD on call 11/19 7am  Time of first page:  0725  Time MD responded: 0744  MD stated he would pass it along.

## 2014-05-11 NOTE — Progress Notes (Signed)
PULMONARY / CRITICAL CARE MEDICINE   Name: Jonathon Galvan MRN: [id number] DOB: 1945/09/16    ADMISSION DATE:  05/04/2014  INITIAL PRESENTATION:  68 M with extensive PMH inc NICM admitted after VF arrest. Hypothermia protocol initiated on admission. 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 LVEF approx 40-45% 11/12 Hypothermia protocol implemented   11/12 CT head: NAICP 11/12 CT neck: No cervical spine fracture or traumatic subluxation. Advanced multilevel cervical spondylosis is noted 11/14 echo>>>30% to 35%. Dyskinesis of the mid-apicalanteroseptal myocardium. Doppler parameters are consistent with abnormal left ventricular relaxation (grade 1 diastolic dysfunction). 11/16 eeg>>>no seizure, generalized slowing, c/w hypoxic encephalopathy  11/17: several nurses holding him down. Very agitated. Still w/ copious secretions from ETT. Started on diprivan, precedex stopped. Also started on clonazepam and Seroquel.   INDWELLING DEVICES: R radial A-line 11/12 >> 11/13 ETT 11/12 >>  R femoral CVL 11/12 >> 11/15 R femoral arterial sheath 11/12 >> 11/15 Left IJ CVL 11/16>>>  SUBJECTIVE:  Withdraws to pain, no events overnight, for MRI today, remains on propofol.  VITAL SIGNS: Temp:  [98.1 F (36.7 C)-99.7 F (37.6 C)] 99.1 F (37.3 C) (11/19 0900) Pulse Rate:  [70-86] 82 (11/19 0900) Resp:  [13-29] 17 (11/19 0900) BP: (118-176)/(49-100) 156/61 mmHg (11/19 0900) SpO2:  [96 %-100 %] 98 % (11/19 0900) FiO2 (%):  [40 %] 40 % (11/19 0900) Weight:  [89.8 kg (197 lb 15.6 oz)] 89.8 kg (197 lb 15.6 oz) (11/19 0403)  HEMODYNAMICS: CVP:  [6 mmHg-11 mmHg] 7 mmHg  VENTILATOR SETTINGS: Vent Mode:  [-] CPAP FiO2 (%):  [40 %] 40 % Set Rate:  [12 bmp] 12 bmp Vt Set:  [620 mL] 620 mL PEEP:  [5 cmH20] 5 cmH20 Pressure Support:  [8 cmH20] 8 cmH20 Plateau Pressure:  [20  cmH20-22 cmH20] 22 cmH20  INTAKE / OUTPUT:  Intake/Output Summary (Last 24 hours) at 05/11/14 1012 Last data filed at 05/11/14 0900  Gross per 24 hour  Intake 3223.45 ml  Output   2820 ml  Net 403.45 ml   PHYSICAL EXAMINATION: General: Sedated, intubated, moving ext to painful stimuli but no command, purposefully. Neuro: Moving all ext to painful stimuli but not to command. HEENT: superficial scalp abrasion, C collar in place. Cardiovascular:  s1 s2 RRR. Lungs: ronchi t/o. Abdomen: soft, diminished BS. 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  CBC Recent Labs     05/09/14  0500  05/10/14  0335  05/11/14  0350  WBC  12.3*  11.9*  10.8*  HGB  10.8*  10.7*  10.8*  HCT  33.6*  33.6*  34.4*  PLT  177  188  197   Coag's No results for input(s): APTT, INR in the last 72 hours.  BMET Recent Labs     05/09/14  0500  05/10/14  0335  05/11/14  0350  NA  149*  153*  153*  K  3.1*  3.6*  4.3  CL  111  116*  118*  CO2  27  26  24   BUN  25*  24*  19  CREATININE  1.92*  2.07*  1.68*  GLUCOSE  129*  145*  151*   Electrolytes Recent Labs     05/09/14  0500  05/10/14  0335  05/11/14  0350  CALCIUM  8.3*  8.4  8.1*  MG  2.2   --   2.3  PHOS  3.0   --   3.7    Sepsis Markers Recent Labs     05/09/14  0930  05/10/14  0335  05/11/14  0350  PROCALCITON  1.71  1.48  0.99   ABG Recent Labs     05/09/14  0602  05/11/14  0320  PHART  7.323*  7.298*  PCO2ART  48.3*  53.1*  PO2ART  119.0*  98.6   Liver Enzymes Recent Labs     05/10/14  0335  AST  34  ALT  20  ALKPHOS  52  BILITOT  0.3  ALBUMIN  2.2*   Cardiac Enzymes No results for input(s): TROPONINI, PROBNP in the last 72 hours.  Glucose Recent Labs     05/10/14  0733  05/10/14  1241  05/10/14  1652  05/10/14  1931  05/10/14  2331  05/11/14  0349  GLUCAP  104*  179*  139*  150*  154*  153*   Imaging Ct Head Wo Contrast  05/09/2014   CLINICAL DATA:  Cerebral edema.  Status post cardiac arrest.  EXAM:  CT HEAD WITHOUT CONTRAST  TECHNIQUE: Contiguous axial images were obtained from the base of the skull through the vertex without intravenous contrast.  COMPARISON:  CT scan of May 04, 2014.  FINDINGS: Bony calvarium appears intact. Bilateral ethmoid and sphenoid and left maxillary sinusitis is noted. Mild diffuse cortical atrophy is noted. Mild chronic ischemic white matter disease is noted. No mass effect or midline shift is noted. Ventricular size is within normal limits. There is no evidence of mass lesion, hemorrhage or acute infarction.  IMPRESSION: Mild diffuse cortical atrophy. Mild chronic ischemic white matter disease. Bilateral ethmoid and sphenoid sinusitis. No acute intracranial abnormality seen.   Electronically Signed   By: Roque LiasJames  Green M.D.   On: 05/09/2014 15:17   Dg Chest Port 1 View  05/11/2014   CLINICAL DATA:  Intubated patient with history of shortness of breath and pneumonia  EXAM: PORTABLE CHEST - 1 VIEW  COMPARISON:  Portable chest x-ray of May 10, 2014  FINDINGS: The lungs are well-expanded. The interstitial markings have improved dramatically. Minimal increased density persists in the retrocardiac region on the left. The cardiac silhouette remains enlarged. The pulmonary vascularity is more normal today. There is no significant pleural effusion. There is a stable appearance of the destructive lesion of the lateral aspect of the right fifth rib.  The endotracheal tube tip lies 2.9 cm above the crotch of the carina. The esophagogastric tube tip projects below the inferior margin of the image. The the observed bony thorax is unremarkable. Left internal jugular venous catheter tip projects over the proximal SVC.  IMPRESSION: Dramatic improvement in the appearance of the lungs since yesterday's study consistent with resolving interstitial edema and/or pneumonia. Persistent left lower lobe atelectasis or pneumonia is present. Abnormal contour of the lateral aspect of the right fifth  rib is unchanged.   Electronically Signed   By: David  Swazil[place]Jordan   On: 05/11/2014 07:36   Dg Chest Port 1 View  05/10/2014   CLINICAL DATA:  68 year old male with shortness of breath and pneumonia. Initial encounter.  EXAM: PORTABLE CHEST - 1 VIEW  COMPARISON:  05/09/2014 and earlier.  FINDINGS: Portable AP semi upright view at 0438 hrs. Stable endotracheal tube. Stable left IJ central line. Stable visualized enteric tube.  Continued dense retrocardiac opacity and veiling opacity at both lung bases. Stable pulmonary vascularity. No pneumothorax. The patient is more rotated to the  right.  Evidence of right lateral fourth or fifth rib destruction (arrow). The right lateral ribs were normal on 08/10/2007 chest CT. No other acute osseous abnormality identified.  IMPRESSION: 1.  Stable lines and tubes. 2. Stable ventilation with bilateral pleural effusions and lower lobe collapse or consolidation. 3. Destruction versus fracture of the right lateral fourth or fifth rib (arrow). Has there been recent CPR or is there a known malignancy?   Electronically Signed   By: Augusto Gamble M.D.   On: 05/10/2014 06:20   [id number]   ASSESSMENT / PLAN:  PULMONARY A: Acute respiratory failure s/p cardiopulmonary arrest.  Aspiration PNA Diuresed very well overnight P:   Continue PS trials, no extubation given mental status. Will speak with family in AM regarding trach/peg. See Neuro section. PAD protocol for RAS goal -2 See ID section    CARDIOVASCULAR A:  VF cardiac arrest Previously documented NICM: EF 30-35%  Chronic LBBB Sinus bradycardia Ventricular ectopy Cardiogenic shock-->shock resolved  P:  Tele. If awaken, will need ICD. Cont coreg and Lipitor. Negative fluid balance goal. Lasix. Increase Bidil to 2 tablets BID.  RENAL A:   CRI w/ acute on chronic injury  scr rising after diuresis  Hypernatremia  Hypokalemia   P:   Chem in am. Lasix 40 mg IV q8 x2 doses. Free water increase to 300 q4  hrs. Replace electrolytes as indicated.  GASTROINTESTINAL A:   Npo 11/14, hep c? Diet  Ammonia 61 on 11/17 P:   SUP: IV PPI Tf to goal Hep C Ab positive. Continue lactulose given high ammonia   HEMATOLOGIC A:   Mild anemia  P:  DVT px: SQ heparin Monitor CBC intermittently Transfuse per usual ICU guidelines  INFECTIOUS A:   Aspiration PNA  P:   Trend PCT  Unasyn 11/16>>>11/17 Rocephin 11/17>>>11/22 Sputum 11/16>>>NTD BC 11/12>>>NTD  ENDOCRINE A:   DM 2 P:   Cont SSI  NEUROLOGIC A:   Post anoxic encephalopathy H/o cocaine, methadone (active?) P:   RASS goal: -2 D/C propofol post MRI Versed PRN Fentanyl drip Continue clonazepam Increase seroquel to 200 BID Keep collar on until can clear clinicaly Neuro consulted and following   FAMILY  - Updated sister at bedside on 11/19, family meeting in AM for decision on trach/peg.  MRI today, d/c propofol post MRI, begin active diureses, increase free water and BP medications as above.  My CC time of 35 min.  Alyson Reedy, M.D. [facility] Pulmonary/Critical Care Medicine. Pager: [phone]. After hours pager: [phone].

## 2014-05-12 ENCOUNTER — Inpatient Hospital Stay (HOSPITAL_COMMUNITY): Payer: PRIVATE HEALTH INSURANCE

## 2014-05-12 LAB — GLUCOSE, CAPILLARY
GLUCOSE-CAPILLARY: 164 mg/dL — AB (ref 70–99)
Glucose-Capillary: 159 mg/dL — ABNORMAL HIGH (ref 70–99)
Glucose-Capillary: 175 mg/dL — ABNORMAL HIGH (ref 70–99)
Glucose-Capillary: 211 mg/dL — ABNORMAL HIGH (ref 70–99)
Glucose-Capillary: 179 mg/dL — ABNORMAL HIGH (ref 70–99)

## 2014-05-12 LAB — CBC
HCT: 36.6 % — ABNORMAL LOW (ref 39.0–52.0)
Hemoglobin: 11.4 g/dL — ABNORMAL LOW (ref 13.0–17.0)
MCH: 25.9 pg — ABNORMAL LOW (ref 26.0–34.0)
MCHC: 31.1 g/dL (ref 30.0–36.0)
MCV: 83.2 fL (ref 78.0–100.0)
PLATELETS: 220 10*3/uL (ref 150–400)
RBC: 4.4 MIL/uL (ref 4.22–5.81)
RDW: 16.6 % — ABNORMAL HIGH (ref 11.5–15.5)
WBC: 11.4 10*3/uL — ABNORMAL HIGH (ref 4.0–10.5)

## 2014-05-12 LAB — CULTURE, RESPIRATORY

## 2014-05-12 LAB — BLOOD GAS, ARTERIAL
Acid-Base Excess: 3.3 mmol/L — ABNORMAL HIGH (ref 0.0–2.0)
BICARBONATE: 28.3 meq/L — AB (ref 20.0–24.0)
Drawn by: 41308
FIO2: 0.4 %
MECHVT: 620 mL
O2 Saturation: 93.5 %
PCO2 ART: 51.2 mmHg — AB (ref 35.0–45.0)
PEEP/CPAP: 5 cmH2O
PH ART: 7.361 (ref 7.350–7.450)
Patient temperature: 98.6
RATE: 12 resp/min
TCO2: 29.8 mmol/L (ref 0–100)
pO2, Arterial: 69.8 mmHg — ABNORMAL LOW (ref 80.0–100.0)

## 2014-05-12 LAB — BASIC METABOLIC PANEL
ANION GAP: 13 (ref 5–15)
BUN: 22 mg/dL (ref 6–23)
CO2: 28 mEq/L (ref 19–32)
Calcium: 8.9 mg/dL (ref 8.4–10.5)
Chloride: 111 mEq/L (ref 96–112)
Creatinine, Ser: 1.96 mg/dL — ABNORMAL HIGH (ref 0.50–1.35)
GFR, EST AFRICAN AMERICAN: 38 mL/min — AB (ref >90)
GFR, EST NON AFRICAN AMERICAN: 33 mL/min — AB (ref >90)
GLUCOSE: 174 mg/dL — AB (ref 70–99)
POTASSIUM: 3.9 meq/L (ref 3.7–5.3)
Sodium: 152 mEq/L — ABNORMAL HIGH (ref 137–147)

## 2014-05-12 LAB — CULTURE, RESPIRATORY W GRAM STAIN

## 2014-05-12 LAB — MAGNESIUM: MAGNESIUM: 2.5 mg/dL (ref 1.5–2.5)

## 2014-05-12 LAB — PHOSPHORUS: PHOSPHORUS: 5.3 mg/dL — AB (ref 2.3–4.6)

## 2014-05-12 MED ORDER — LACTULOSE 10 GM/15ML PO SOLN
10.0000 g | Freq: Two times a day (BID) | ORAL | Status: DC
  Administered 2014-05-12 – 2014-05-14 (×4): 10 g
  Filled 2014-05-12 (×7): qty 15

## 2014-05-12 MED ORDER — SODIUM CHLORIDE 0.9 % IJ SOLN
10.0000 mL | INTRAMUSCULAR | Status: DC | PRN
  Administered 2014-05-17: 30 mL
  Administered 2014-05-18: 20 mL
  Filled 2014-05-12 (×2): qty 40

## 2014-05-12 MED ORDER — FUROSEMIDE 10 MG/ML IJ SOLN
40.0000 mg | Freq: Three times a day (TID) | INTRAMUSCULAR | Status: AC
  Administered 2014-05-12 (×2): 40 mg via INTRAVENOUS
  Filled 2014-05-12 (×2): qty 4

## 2014-05-12 MED ORDER — SODIUM CHLORIDE 0.9 % IJ SOLN
10.0000 mL | Freq: Two times a day (BID) | INTRAMUSCULAR | Status: DC
  Administered 2014-05-12 (×2): 10 mL
  Administered 2014-05-13: 30 mL

## 2014-05-12 NOTE — Progress Notes (Signed)
Subjective: No significant changes.   Exam: Filed Vitals:   05/12/14 1400  BP: 119/53  Pulse: 79  Temp: 99.3 F (37.4 C)  Resp: 22   Gen: In bed, NAD MS: Does not open eyes, but does attempt to. He does not follow commands today  IW:OEHOZ, corneals intact, eyes dysconjugate tdoya with slight left eye outward deviation Motor: moves all extremities to noxious stimuli.  Sensory:as above   Impression: 68 yo M with persistent encephalopathy following cardiac arrest. I suspect a multifactorial delirium including some degree of hypoxic insult.  I am worried that the seroquel and clonzepam may be over sedating, and would favor using shorter acting sedatives(e.g. Fentanyl, versed drip) which can be more easily weaned. MRI showed no clear signs of injury.   Recommendations: 1) depakote 500mg  BID 2) d/c seroquel and clonzepam  Ritta Slot, MD [facility] [phone]  If 7pm- 7am, please page neurology on call as listed in AMION.

## 2014-05-12 NOTE — Progress Notes (Signed)
SUBJECTIVE:  Pt seen and examined in AM. He had low grade fever of 100.2 overnight. He remains intubated with intermittent agitation. His mental status remains the same. His MRI brain yesterday revealed no evidence of anoxic injury. His telemetry reveals chronic BBB. His blood pressure and heart rate remain stable. His CXR today shows mild pulmonary edema and small left pleural effusion. He received two doses of IV lasix 40 mg yesterday with 5.9L of urine output.     OBJECTIVE:   Vitals:   Filed Vitals:   05/12/14 0800 05/12/14 0815 05/12/14 0900 05/12/14 1000  BP: 133/53 133/53 133/56 128/66  Pulse: 81 80 81 87  Temp: 98.6 F (37 C) 98.6 F (37 C) 98.4 F (36.9 C) 98.8 F (37.1 C)  TempSrc: Core (Comment)     Resp: 14 20 20 23   Height:      Weight:      SpO2: 95% 98% 95% 96%   I&O's:    Intake/Output Summary (Last 24 hours) at 05/12/14 1055 Last data filed at 05/12/14 1000  Gross per 24 hour  Intake 3132.05 ml  Output   7187 ml  Net -4054.95 ml   TELEMETRY: Reviewed telemetry pt in BBB and SB:     PHYSICAL EXAM General: Intubated and sedated Head:   Large abrasion/contusion on right side. C-collar in place.   Lungs:  Intubated. Ronchi in anterior fields. Heart:  HRRR S1 S2  No JVD.   Abdomen: Abdomen soft and non-tender Msk:  Left AKA   Extremities:  SCD's. No edema of RLE. Neuro: Sedated  Psych:  Sedated Skin: No rash   LABS: Basic Metabolic Panel:  Recent Labs  16/03/9610/19/15 0350 05/12/14 0430  NA 153* 152*  K 4.3 3.9  CL 118* 111  CO2 24 28  GLUCOSE 151* 174*  BUN 19 22  CREATININE 1.68* 1.96*  CALCIUM 8.1* 8.9  MG 2.3 2.5  PHOS 3.7 5.3*   Liver Function Tests:  Recent Labs  05/10/14 0335  AST 34  ALT 20  ALKPHOS 52  BILITOT 0.3  PROT 5.8*  ALBUMIN 2.2*   No results for input(s): LIPASE, AMYLASE in the last 72 hours. CBC:  Recent Labs  05/11/14 0350 05/12/14 0430  WBC 10.8* 11.4*  HGB 10.8* 11.4*  HCT 34.4* 36.6*  MCV 83.5  83.2  PLT 197 220   Cardiac Enzymes: No results for input(s): CKTOTAL, CKMB, CKMBINDEX, TROPONINI in the last 72 hours. BNP: Invalid input(s): POCBNP D-Dimer: No results for input(s): DDIMER in the last 72 hours. Hemoglobin A1C: No results for input(s): HGBA1C in the last 72 hours. Fasting Lipid Panel: No results for input(s): CHOL, HDL, LDLCALC, TRIG, CHOLHDL, LDLDIRECT in the last 72 hours. Thyroid Function Tests: No results for input(s): TSH, T4TOTAL, T3FREE, THYROIDAB in the last 72 hours.  Invalid input(s): FREET3 Anemia Panel: No results for input(s): VITAMINB12, FOLATE, FERRITIN, TIBC, IRON, RETICCTPCT in the last 72 hours. Coag Panel:   Lab Results  Component Value Date   INR 1.23 05/05/2014   INR 1.23 05/04/2014   INR 1.10 06/20/2010    RADIOLOGY: Ct Head Wo Contrast  05/09/2014   CLINICAL DATA:  Cerebral edema.  Status post cardiac arrest.  EXAM: CT HEAD WITHOUT CONTRAST  TECHNIQUE: Contiguous axial images were obtained from the base of the skull through the vertex without intravenous contrast.  COMPARISON:  CT scan of May 04, 2014.  FINDINGS: Bony calvarium appears intact. Bilateral ethmoid and sphenoid and left maxillary sinusitis is  noted. Mild diffuse cortical atrophy is noted. Mild chronic ischemic white matter disease is noted. No mass effect or midline shift is noted. Ventricular size is within normal limits. There is no evidence of mass lesion, hemorrhage or acute infarction.  IMPRESSION: Mild diffuse cortical atrophy. Mild chronic ischemic white matter disease. Bilateral ethmoid and sphenoid sinusitis. No acute intracranial abnormality seen.   Electronically Signed   By: Roque Lias M.D.   On: 05/09/2014 15:17   Ct Head Wo Contrast  05/04/2014   CLINICAL DATA:  Syncopal episode. Patient found down. Out of hospital cardiac arrest.  EXAM: CT HEAD WITHOUT CONTRAST  CT CERVICAL SPINE WITHOUT CONTRAST  TECHNIQUE: Multidetector CT imaging of the head and cervical  spine was performed following the standard protocol without intravenous contrast. Multiplanar CT image reconstructions of the cervical spine were also generated.  COMPARISON:  Prior CT head from 08/10/2007.  FINDINGS: CT HEAD FINDINGS  No evidence for acute infarction, hemorrhage, mass lesion, hydrocephalus, or extra-axial fluid. Mild cerebral and cerebellar atrophy. Hypoattenuation of white matter is consistent with chronic microvascular ischemic change.  The calvarium is intact. There is no sinus or mastoid air fluid level although the ethmoids are opacified. Likely post intubation. The patient is intubated, with accompanying nasopharyngeal fluid.  CT CERVICAL SPINE FINDINGS  There is no visible cervical spine fracture, traumatic subluxation, prevertebral soft tissue swelling, or intraspinal hematoma. Mild straightening of the normal cervical lordosis. Mild ossification of the posterior longitudinal ligament. Multilevel disc space narrowing from C2 through T1. Moderately advanced facet arthropathy at C2-3 on the LEFT. Vascular calcification. Central venous line. Endotracheal tube. No pneumothorax. No worrisome neck mass.  IMPRESSION: Chronic changes as described. Similar appearance to 2009. No acute intracranial findings.  No cervical spine fracture or traumatic subluxation. Advanced multilevel cervical spondylosis is noted.  Visualized support tubes and lines appear appropriately placed.  Findings discussed with consulting MD.   Electronically Signed   By: Davonna Belling M.D.   On: 05/04/2014 18:26   Ct Cervical Spine Wo Contrast  05/04/2014   CLINICAL DATA:  Syncopal episode. Patient found down. Out of hospital cardiac arrest.  EXAM: CT HEAD WITHOUT CONTRAST  CT CERVICAL SPINE WITHOUT CONTRAST  TECHNIQUE: Multidetector CT imaging of the head and cervical spine was performed following the standard protocol without intravenous contrast. Multiplanar CT image reconstructions of the cervical spine were also  generated.  COMPARISON:  Prior CT head from 08/10/2007.  FINDINGS: CT HEAD FINDINGS  No evidence for acute infarction, hemorrhage, mass lesion, hydrocephalus, or extra-axial fluid. Mild cerebral and cerebellar atrophy. Hypoattenuation of white matter is consistent with chronic microvascular ischemic change.  The calvarium is intact. There is no sinus or mastoid air fluid level although the ethmoids are opacified. Likely post intubation. The patient is intubated, with accompanying nasopharyngeal fluid.  CT CERVICAL SPINE FINDINGS  There is no visible cervical spine fracture, traumatic subluxation, prevertebral soft tissue swelling, or intraspinal hematoma. Mild straightening of the normal cervical lordosis. Mild ossification of the posterior longitudinal ligament. Multilevel disc space narrowing from C2 through T1. Moderately advanced facet arthropathy at C2-3 on the LEFT. Vascular calcification. Central venous line. Endotracheal tube. No pneumothorax. No worrisome neck mass.  IMPRESSION: Chronic changes as described. Similar appearance to 2009. No acute intracranial findings.  No cervical spine fracture or traumatic subluxation. Advanced multilevel cervical spondylosis is noted.  Visualized support tubes and lines appear appropriately placed.  Findings discussed with consulting MD.   Electronically Signed   By: Jonny Ruiz  Curnes M.D.   On: 05/04/2014 18:26   Mr Brain Wo Contrast  05/11/2014   CLINICAL DATA:  68 year old male status post cardiac arrest an resuscitation with persistent encephalopathy, anoxic brain injury suspected. Initial encounter.  EXAM: MRI HEAD WITHOUT CONTRAST  TECHNIQUE: Multiplanar, multiecho pulse sequences of the brain and surrounding structures were obtained without intravenous contrast.  COMPARISON:  Head CT without contrast 05/09/2004. Brain MRI 08/11/2007.  FINDINGS: No restricted diffusion or evidence of acute infarction. Signal in the deep gray matter nuclei is stable and normal for age  except for incidental perivascular spaces. Brainstem and cerebellum are stable and within normal limits. There is patchy T2 and FLAIR hyperintensity in the cerebral white matter which has progressed since 2009, but is nonspecific. These areas show facilitated diffusion.  No acute intracranial hemorrhage identified. No midline shift, mass effect, or evidence of intracranial mass lesion. No ventriculomegaly. Negative pituitary and cervicomedullary junction. Negative for age visualized cervical spine. Normal bone marrow signal.  Fluid in the pharynx. The patient is intubated. Mild bilateral mastoid effusions. Fluid in mucosal thickening in the dependent paranasal sinuses. Postoperative change to the left globe. Other orbits soft tissues are within normal limits. Visualized scalp soft tissues are within normal limits.  IMPRESSION: 1. No evidence of anoxic injury or acute intracranial abnormality. 2. Progressed but nonspecific cerebral white matter signal changes since 2009, most commonly due to chronic small vessel disease. 3. Intubated, with paranasal sinus and mastoid inflammatory changes.   Electronically Signed   By: Augusto Gamble M.D.   On: 05/11/2014 16:52   Dg Chest Port 1 View  05/12/2014   CLINICAL DATA:  Shortness of breath and pneumonia, intubated patient  EXAM: PORTABLE CHEST - 1 VIEW  COMPARISON:  Portable chest x-ray of May 11, 2014  FINDINGS: The lungs are well-expanded. The left hemidiaphragm the left lateral costophrenic gutter however less well demonstrated today. The pulmonary interstitial markings are mildly increased though stable. Cardiopericardial silhouette is enlarged. There is tortuosity of the descending thoracic aorta.  The endotracheal tube tip lies 3.6 cm above the crotch of the carina. The esophagogastric tube tip projects below the inferior margin of the image. Left internal jugular venous catheter tip projects over the midportion of the SVC.  The abnormality associated with the  lateral aspect of the right fifth rib is unchanged. There is no pneumothorax.  IMPRESSION: Mildly increased density at the left lung base is consistent with atelectasis and small pleural effusion. There is stable enlargement of the cardiac silhouette and mild pulmonary interstitial edema.   Electronically Signed   By: David  [place]   On: 05/12/2014 08:00   Dg Chest Port 1 View  05/11/2014   CLINICAL DATA:  Intubated patient with history of shortness of breath and pneumonia  EXAM: PORTABLE CHEST - 1 VIEW  COMPARISON:  Portable chest x-ray of May 10, 2014  FINDINGS: The lungs are well-expanded. The interstitial markings have improved dramatically. Minimal increased density persists in the retrocardiac region on the left. The cardiac silhouette remains enlarged. The pulmonary vascularity is more normal today. There is no significant pleural effusion. There is a stable appearance of the destructive lesion of the lateral aspect of the right fifth rib.  The endotracheal tube tip lies 2.9 cm above the crotch of the carina. The esophagogastric tube tip projects below the inferior margin of the image. The the observed bony thorax is unremarkable. Left internal jugular venous catheter tip projects over the proximal SVC.  IMPRESSION: Dramatic improvement in  the appearance of the lungs since yesterday's study consistent with resolving interstitial edema and/or pneumonia. Persistent left lower lobe atelectasis or pneumonia is present. Abnormal contour of the lateral aspect of the right fifth rib is unchanged.   Electronically Signed   By: David  [place]   On: 05/11/2014 07:36   Dg Chest Port 1 View  05/10/2014   CLINICAL DATA:  68 year old male with shortness of breath and pneumonia. Initial encounter.  EXAM: PORTABLE CHEST - 1 VIEW  COMPARISON:  05/09/2014 and earlier.  FINDINGS: Portable AP semi upright view at 0438 hrs. Stable endotracheal tube. Stable left IJ central line. Stable visualized enteric tube.   Continued dense retrocardiac opacity and veiling opacity at both lung bases. Stable pulmonary vascularity. No pneumothorax. The patient is more rotated to the right.  Evidence of right lateral fourth or fifth rib destruction (arrow). The right lateral ribs were normal on 08/10/2007 chest CT. No other acute osseous abnormality identified.  IMPRESSION: 1.  Stable lines and tubes. 2. Stable ventilation with bilateral pleural effusions and lower lobe collapse or consolidation. 3. Destruction versus fracture of the right lateral fourth or fifth rib (arrow). Has there been recent CPR or is there a known malignancy?   Electronically Signed   By: Augusto Gamble M.D.   On: 05/10/2014 06:20   Dg Chest Port 1 View  05/09/2014   CLINICAL DATA:  Assess ET tube position.  EXAM: PORTABLE CHEST - 1 VIEW  COMPARISON:  05/08/2014  FINDINGS: The endotracheal tube is 5.3 cm above the carina. The NG tube is stable. The left IJ catheter is stable. Persistent cardiac enlargement and vascular congestion. Small pleural effusions and bibasilar atelectasis.  IMPRESSION: Stable support apparatus.  Persistent vascular congestion, mild edema, small effusions and bibasilar atelectasis.   Electronically Signed   By: Loralie Champagne M.D.   On: 05/09/2014 07:40   Dg Chest Port 1 View  05/08/2014   CLINICAL DATA:  68 year old male status post central line placement.  EXAM: PORTABLE CHEST - 1 VIEW  COMPARISON:  05/08/2014, 5:08 a.m.  FINDINGS: Cardiomediastinal silhouette unchanged in size and contour. Fullness of the central vasculature again evident.  Diffuse interstitial and airspace opacities. Improved aeration at the bases of the lungs.  Interval placement of left IJ central venous catheter, which terminates in the region of the left brachycephalic vein and superior vena cava confluence. No evidence of pneumothorax.  Gastric tube again projects over the mediastinum, terminating out of the field of view. The gastric port projects over the  region of the stomach.  Endotracheal tube is unchanged terminating suitably above the carina.  IMPRESSION: Interval placement of left IJ central venous catheter, which terminates at the confluence of the superior vena cava and left brachycephalic vein. No evidence of pneumothorax.  Otherwise unchanged support apparatus.  Similar appearance of mixed interstitial and airspace disease, compatible with edema.  Improved aeration at the bilateral lung bases.  Signed,  Yvone Neu. Loreta Ave, DO  [facility]  [facility]   Electronically Signed   By: Gilmer Mor D.O.   On: 05/08/2014 17:30   Dg Chest Port 1 View  05/08/2014   CLINICAL DATA:  Ventilator dependent respiratory failure. Acute respiratory acidosis. Shortness of breath. Prior history of CHF.  EXAM: PORTABLE CHEST - 1 VIEW  COMPARISON:  Portable chest x-rays yesterday dating back to 05/04/2014.  FINDINGS: Endotracheal tube tip in satisfactory position projecting approximately 3 cm above the carina. Nasogastric tube courses below the diaphragm  into the stomach. Cardiac silhouette moderately to markedly enlarged but stable. Interval development of mild-to-moderate diffuse interstitial and airspace pulmonary edema since yesterday. Developing bilateral pleural effusions and associated dense passive atelectasis in the lower lobes.  IMPRESSION: 1. Support apparatus satisfactory. 2. New moderate CHF, with interstitial and airspace pulmonary edema and moderate-sized bilateral pleural effusions. Associated passive atelectasis in the lower lobes.   Electronically Signed   By: Hulan Saas M.D.   On: 05/08/2014 07:43   Dg Chest Port 1 View  05/07/2014   CLINICAL DATA:  Cardiac arrest, history of asthma  EXAM: PORTABLE CHEST - 1 VIEW  COMPARISON:  05/06/2014  FINDINGS: Cardiomediastinal silhouette is stable. Endotracheal tube in place with tip 4.5 cm above the carina. Stable NG tube position. Mild basilar atelectasis  again noted. No segmental infiltrate or pulmonary edema  IMPRESSION: Stable support apparatus. Again noted mild basilar atelectasis. No segmental infiltrate or pulmonary edema.   Electronically Signed   By: Natasha Mead M.D.   On: 05/07/2014 11:22   Dg Chest Port 1 View  05/06/2014   CLINICAL DATA:  Respiratory failure  EXAM: PORTABLE CHEST - 1 VIEW  COMPARISON:  05/05/1949  FINDINGS: An endotracheal tube is again noted 2.8 cm above the carina. A nasogastric catheter is noted within the stomach. The cardiac shadow remains enlarged. Mild bibasilar atelectatic changes are seen. No significant vascular congestion is noted.  IMPRESSION: Mild bibasilar atelectatic changes.  Tubes and lines as described.   Electronically Signed   By: Alcide Clever M.D.   On: 05/06/2014 07:22   Dg Chest Port 1 View  05/05/2014   CLINICAL DATA:  Evaluate airspace disease  EXAM: PORTABLE CHEST - 1 VIEW  COMPARISON:  05/04/2014  FINDINGS: Endotracheal tube tip between the clavicular heads and carina. A gastric suction tube enters the stomach. Right subclavian central line, malpositioned on the previous study, has been removed.  Unchanged cardiomegaly.  Stable aortic contours.  Pulmonary venous congestion has decreased but there is now hazy and streaky bibasilar opacities. No pneumothorax.  IMPRESSION: 1. New orogastric tube is in good position. 2. Improved pulmonary edema. 3. Worsening basilar aeration, likely layering small effusions and atelectasis. Pneumonia cannot be excluded.   Electronically Signed   By: Tiburcio Pea M.D.   On: 05/05/2014 08:00   Dg Chest Portable 1 View  05/04/2014   CLINICAL DATA:  Status post cardiac arrest and CPR.  Intubated.  EXAM: PORTABLE CHEST - 1 VIEW  COMPARISON:  06/13/2010.  FINDINGS: Interval endotracheal tube with its tip 3.6 cm above the carina. Breast of enlargement of the cardiac silhouette with increased prominence of the pulmonary vasculature and interstitial markings. No pleural fluid  seen. No pneumothorax. An interval epicardial pacemaker lead is in place. Right subclavian catheter extending into the right neck, its tip not included.  IMPRESSION: 1. Malpositioned right subclavian catheter extending into the neck on the right. 2. Progressive cardiomegaly and changes of congestive heart failure. These results will be called to the ordering clinician or representative by the Radiologist Assistant, and communication documented in the PACS or zVision Dashboard.   Electronically Signed   By: Gordan Payment M.D.   On: 05/04/2014 17:47   Dg Abd Portable 1v  05/04/2014   CLINICAL DATA:  Orogastric tube placement.  EXAM: PORTABLE ABDOMEN - 1 VIEW  COMPARISON:  08/11/2007.  FINDINGS: Orogastric tube tip in the mid to distal stomach. The visualized bowel gas pattern is normal. The patient is rotated to the right. Possible  hiatal hernia and right basilar airspace opacity.  IMPRESSION: 1. Orogastric tube tip in the mid to distal stomach. 2. Possible hiatal hernia. 3. Mild patchy atelectasis or pneumonia at the right lung base.   Electronically Signed   By: Gordan Payment M.D.   On: 05/04/2014 21:39   Principal Problem:   Cardiac arrest Active Problems:   Chronic hepatitis C   DM2 (diabetes mellitus, type 2)   Essential hypertension   Peripheral vascular disease   VF (ventricular fibrillation)   Nonischemic cardiomyopathy   Hx of AKA (above knee amputation)   Acute respiratory acidosis   Respiratory failure   Cerebral edema   Anoxic brain injury   Pneumonia     ASSESSMENT: 69 yo man with pmh of non-ishemic cardiomyapthy with EF 30-35% on echo in 2011, HTN, HL, PVD, Type II DM s/p left AKA, past cocaine abuse, tobacco abuse, and chronic LBBB who presented with vfib arrest with ROSC found to have non-obstructive catherization.   PLAN:   Vfib Arrest s/p Non-obstructive cardiac catherization -  Pt s/p non-obstructive cardiac catherization and  hypothermic protocol. Etiology unclear, possible  due to non-ischemic cardiomyopathy in setting of chronic LBBB. Will consider ICD pending neurological status. EEG with hypoxic encephalopathy. MRI brain with no anoxic injury.  Per neurology will need to observe for at least 1 week post-rewarming for prognosis (possibly this weekend?).  Ventilator Dependent Respiratory Failure in setting of probable MRSA PNA- Currently intubated. On IV ceftriaxone day 6 for aspiration PNA and vancomycin day 2 for MRSA on tracheal aspirate. CXR with small left pleural effusion. Management per PCCM.  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 Also with grade 1 diastolic dysfunction. Pt with pmh of cocaine abuse. Consider ICD pending neurological status. CXR with mild pulmonary edema. Per PCCM to have IV lasix 40 mg for 2 doses today. Pt at home on lasix 80 mg BID and enalapril 20 mg BID.   Acute Encepalopathy - On seroquel 100 mg BID, clonazepam 1 mg BID, and depakote 500 mg BID per neurology. MRI brain without anoxic injury.  Multifactorial per neurology. Hypernatremia - Na 152 today. He is on increased free water per tube.  Hypertension - Currently hypertensive. Currently on carvedilol 12.5 mg BID and bidil 20-37.5 mg 2 tabs BID. Pt at home on amlodipine 10 mg daily, enalapril 20 mg BID, lasix 80 mg BID, and coreg 25 mg BID.  Acute Normocytic Anemia - Hg stable at 11.4 below baseline 14. FOBT negative. UA with trace hematuria. Currently on SQ heparin for DVT ppx. Monitor for bleeding.  Nonoliguric AKI CKD Stage 3 - Cr increased from 1.68 to 1.96, above baseline 1.5. Per PCCM to have IV lasix 40 mg for 2 doses today.  Etiology pre-renal due to volume depletion vs ischemic ATN. He is on free water per tube. Continue to monitor.  Hypokalemia - K 3.9 this AM. Continue to monitor in setting of IV diuresis.  Hyperlipidemia - Continue home liptor 10 mg daily.  PVD - Pt at home on aspirin 81 mg daily. Hold in  setting of acute anemia.  Non-Insulin Dependent Type II DM - A1c 6.9.  Pt is s/p left AKA. Hold home metformin and glipizide. Pt is on moderate SSI.  Hepatitis C Infection - HCV viral load [id number].  Normal AST/ALT with chronic hypoalbuminemia. Anti-sHBV negative indicating no prior HBC vaccination.     Otis Brace, MD  PGY-II IMTS Pager  [phone] 05/12/2014  10:55 AM  Attending note by Dr. Katrinka Blazing to follow.

## 2014-05-12 NOTE — Progress Notes (Addendum)
Approximately 67ml of IV versed wasted in sink. Drug was expired. Witnessed by R. Madilyn Fireman, RN and K. Sheppard Penton, RN

## 2014-05-12 NOTE — Progress Notes (Signed)
PULMONARY / CRITICAL CARE MEDICINE   Name: Jonathon Galvan MRN: [id number] DOB: [birth year]    ADMISSION DATE:  05/04/2014  INITIAL PRESENTATION:  [age] M with extensive PMH inc NICM admitted after VF arrest. Hypothermia protocol initiated on admission. 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 LVEF approx 40-45% 11/12 Hypothermia protocol implemented   11/12 CT head: NAICP 11/12 CT neck: No cervical spine fracture or traumatic subluxation. Advanced multilevel cervical spondylosis is noted 11/14 echo>>>30% to 35%. Dyskinesis of the mid-apicalanteroseptal myocardium. Doppler parameters are consistent with abnormal left ventricular relaxation (grade 1 diastolic dysfunction). 11/16 eeg>>>no seizure, generalized slowing, c/w hypoxic encephalopathy  11/17: several nurses holding him down. Very agitated. Still w/ copious secretions from ETT. Started on diprivan, precedex stopped. Also started on clonazepam and Seroquel.   INDWELLING DEVICES: R radial A-line 11/12 >> 11/13 ETT 11/12 >>  R femoral CVL 11/12 >> 11/15 R femoral arterial sheath 11/12 >> 11/15 Left IJ CVL 11/16>>>  SUBJECTIVE:  Withdraws to pain, no events overnight, for MRI today, remains on propofol.  VITAL SIGNS: Temp:  [98.1 F (36.7 C)-100.2 F (37.9 C)] 98.8 F (37.1 C) (11/20 1000) Pulse Rate:  [64-88] 87 (11/20 1000) Resp:  [12-26] 23 (11/20 1000) BP: (114-149)/(52-70) 128/66 mmHg (11/20 1000) SpO2:  [92 %-100 %] 96 % (11/20 1000) FiO2 (%):  [40 %] 40 % (11/20 0815) Weight:  [85.8 kg (189 lb 2.5 oz)] 85.8 kg (189 lb 2.5 oz) (11/20 0300)  HEMODYNAMICS: CVP:  [6 mmHg] 6 mmHg  VENTILATOR SETTINGS: Vent Mode:  [-] PSV;CPAP FiO2 (%):  [40 %] 40 % Set Rate:  [12 bmp] 12 bmp Vt Set:  [620 mL] 620 mL PEEP:  [5 cmH20] 5 cmH20 Pressure Support:  [8 cmH20] 8 cmH20 Plateau Pressure:  [16 cmH20-25  cmH20] 21 cmH20  INTAKE / OUTPUT:  Intake/Output Summary (Last 24 hours) at 05/12/14 1027 Last data filed at 05/12/14 1000  Gross per 24 hour  Intake 3132.05 ml  Output   7187 ml  Net -4054.95 ml   PHYSICAL EXAMINATION: General: Sedated, intubated, moving ext to painful stimuli but no command, purposefully. Neuro: Moving all ext to painful stimuli but not to command. HEENT: superficial scalp abrasion, C collar in place. Cardiovascular:  s1 s2 RRR. Lungs: ronchi t/o. Abdomen: soft, diminished BS. 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  CBC Recent Labs     05/10/14  0335  05/11/14  0350  05/12/14  0430  WBC  11.9*  10.8*  11.4*  HGB  10.7*  10.8*  11.4*  HCT  33.6*  34.4*  36.6*  PLT  188  197  220   Coag's No results for input(s): APTT, INR in the last 72 hours.  BMET Recent Labs     05/10/14  0335  05/11/14  0350  05/12/14  0430  NA  153*  153*  152*  K  3.6*  4.3  3.9  CL  116*  118*  111  CO2  26  24  28   BUN  24*  19  22  CREATININE  2.07*  1.68*  1.96*  GLUCOSE  145*  151*  174*   Electrolytes Recent Labs     05/10/14  0335  05/11/14  0350  05/12/14  0430  CALCIUM  8.4  8.1*  8.9  MG   --   2.3  2.5  PHOS   --  3.7  5.3*    Sepsis Markers Recent Labs     05/10/14  0335  05/11/14  0350  PROCALCITON  1.48  0.99   ABG Recent Labs     05/11/14  0320  05/12/14  0435  PHART  7.298*  7.361  PCO2ART  53.1*  51.2*  PO2ART  98.6  69.8*   Liver Enzymes Recent Labs     05/10/14  0335  AST  34  ALT  20  ALKPHOS  52  BILITOT  0.3  ALBUMIN  2.2*   Cardiac Enzymes No results for input(s): TROPONINI, PROBNP in the last 72 hours.  Glucose Recent Labs     05/11/14  1145  05/11/14  1651  05/11/14  1953  05/11/14  2328  05/12/14  0312  05/12/14  0748  GLUCAP  102*  105*  112*  156*  159*  175*   Imaging Mr Brain Wo Contrast  05/11/2014   CLINICAL DATA:  68 year old male status post cardiac arrest an resuscitation with persistent  encephalopathy, anoxic brain injury suspected. Initial encounter.  EXAM: MRI HEAD WITHOUT CONTRAST  TECHNIQUE: Multiplanar, multiecho pulse sequences of the brain and surrounding structures were obtained without intravenous contrast.  COMPARISON:  Head CT without contrast 05/09/2004. Brain MRI 08/11/2007.  FINDINGS: No restricted diffusion or evidence of acute infarction. Signal in the deep gray matter nuclei is stable and normal for age except for incidental perivascular spaces. Brainstem and cerebellum are stable and within normal limits. There is patchy T2 and FLAIR hyperintensity in the cerebral white matter which has progressed since 2009, but is nonspecific. These areas show facilitated diffusion.  No acute intracranial hemorrhage identified. No midline shift, mass effect, or evidence of intracranial mass lesion. No ventriculomegaly. Negative pituitary and cervicomedullary junction. Negative for age visualized cervical spine. Normal bone marrow signal.  Fluid in the pharynx. The patient is intubated. Mild bilateral mastoid effusions. Fluid in mucosal thickening in the dependent paranasal sinuses. Postoperative change to the left globe. Other orbits soft tissues are within normal limits. Visualized scalp soft tissues are within normal limits.  IMPRESSION: 1. No evidence of anoxic injury or acute intracranial abnormality. 2. Progressed but nonspecific cerebral white matter signal changes since 2009, most commonly due to chronic small vessel disease. 3. Intubated, with paranasal sinus and mastoid inflammatory changes.   Electronically Signed   By: Augusto Gamble M.D.   On: 05/11/2014 16:52   Dg Chest Port 1 View  05/12/2014   CLINICAL DATA:  Shortness of breath and pneumonia, intubated patient  EXAM: PORTABLE CHEST - 1 VIEW  COMPARISON:  Portable chest x-ray of May 11, 2014  FINDINGS: The lungs are well-expanded. The left hemidiaphragm the left lateral costophrenic gutter however less well demonstrated today.  The pulmonary interstitial markings are mildly increased though stable. Cardiopericardial silhouette is enlarged. There is tortuosity of the descending thoracic aorta.  The endotracheal tube tip lies 3.6 cm above the crotch of the carina. The esophagogastric tube tip projects below the inferior margin of the image. Left internal jugular venous catheter tip projects over the midportion of the SVC.  The abnormality associated with the lateral aspect of the right fifth rib is unchanged. There is no pneumothorax.  IMPRESSION: Mildly increased density at the left lung base is consistent with atelectasis and small pleural effusion. There is stable enlargement of the cardiac silhouette and mild pulmonary interstitial edema.   Electronically Signed   By: David  [place]   On: 05/12/2014 08:00  Dg Chest Port 1 View  05/11/2014   CLINICAL DATA:  Intubated patient with history of shortness of breath and pneumonia  EXAM: PORTABLE CHEST - 1 VIEW  COMPARISON:  Portable chest x-ray of May 10, 2014  FINDINGS: The lungs are well-expanded. The interstitial markings have improved dramatically. Minimal increased density persists in the retrocardiac region on the left. The cardiac silhouette remains enlarged. The pulmonary vascularity is more normal today. There is no significant pleural effusion. There is a stable appearance of the destructive lesion of the lateral aspect of the right fifth rib.  The endotracheal tube tip lies 2.9 cm above the crotch of the carina. The esophagogastric tube tip projects below the inferior margin of the image. The the observed bony thorax is unremarkable. Left internal jugular venous catheter tip projects over the proximal SVC.  IMPRESSION: Dramatic improvement in the appearance of the lungs since yesterday's study consistent with resolving interstitial edema and/or pneumonia. Persistent left lower lobe atelectasis or pneumonia is present. Abnormal contour of the lateral aspect of the right fifth  rib is unchanged.   Electronically Signed   By: David  [place]   On: 05/11/2014 07:36   [id number]   ASSESSMENT / PLAN:  PULMONARY A: Acute respiratory failure s/p cardiopulmonary arrest.  Aspiration PNA Diuresed very well overnight P:   Continue PS trials, no extubation given mental status. Family discussing trach/peg options. See Neuro section. PAD protocol for RAS goal -2 See ID section    CARDIOVASCULAR A:  VF cardiac arrest Previously documented NICM: EF 30-35%  Chronic LBBB Sinus bradycardia Ventricular ectopy Cardiogenic shock-->shock resolved  P:  Tele. If awaken, will need ICD. Cont coreg and Lipitor. Negative fluid balance goal. Lasix. Continue Bidil at 2 tablets BID.  RENAL A:   CRI w/ acute on chronic injury  scr rising after diuresis  Hypernatremia  Hypokalemia   P:   Chem in am. Lasix 40 mg IV q8 x2 doses. Free water increased to 300 q4 hrs. Replace electrolytes as indicated.  GASTROINTESTINAL A:   Npo 11/14, hep c? Diet  Ammonia 61 on 11/17 P:   SUP: IV PPI Tf to goal Hep C Ab positive. Decrease lactulose now that more stable. Ammonia level in AM.  HEMATOLOGIC A:   Mild anemia  P:  DVT px: SQ heparin Monitor CBC intermittently Transfuse per usual ICU guidelines  INFECTIOUS A:   Aspiration PNA  P:   Trend PCT  Unasyn 11/16>>>11/17 Rocephin 11/17>>>11/22 Sputum 11/16>>>NTD BC 11/12>>>NTD  ENDOCRINE A:   DM 2 P:   Cont SSI  NEUROLOGIC A:   Post anoxic encephalopathy H/o cocaine, methadone (active?) P:   RASS goal: -2 D/C propofol post MRI Versed PRN Fentanyl drip Continue clonazepam Increased seroquel to 200 BID Keep collar on until can clear clinicaly Neuro consulted and following   FAMILY  - Will speak with family early next week for trach/peg.  My CC time of 35 min.  Alyson Reedy, M.D. [facility] Pulmonary/Critical Care Medicine. Pager: [phone]. After hours pager: [phone].

## 2014-05-13 ENCOUNTER — Inpatient Hospital Stay (HOSPITAL_COMMUNITY): Payer: PRIVATE HEALTH INSURANCE

## 2014-05-13 LAB — BLOOD GAS, ARTERIAL
Acid-Base Excess: 4.9 mmol/L — ABNORMAL HIGH (ref 0.0–2.0)
Bicarbonate: 30 mEq/L — ABNORMAL HIGH (ref 20.0–24.0)
DRAWN BY: 347621
FIO2: 0.4 %
MECHVT: 620 mL
O2 SAT: 96.3 %
PEEP: 5 cmH2O
PO2 ART: 86.1 mmHg (ref 80.0–100.0)
Patient temperature: 98.6
RATE: 12 resp/min
TCO2: 31.7 mmol/L (ref 0–100)
pCO2 arterial: 54.2 mmHg — ABNORMAL HIGH (ref 35.0–45.0)
pH, Arterial: 7.362 (ref 7.350–7.450)

## 2014-05-13 LAB — CBC
HCT: 36 % — ABNORMAL LOW (ref 39.0–52.0)
Hemoglobin: 11.3 g/dL — ABNORMAL LOW (ref 13.0–17.0)
MCH: 26.2 pg (ref 26.0–34.0)
MCHC: 31.4 g/dL (ref 30.0–36.0)
MCV: 83.3 fL (ref 78.0–100.0)
Platelets: 213 10*3/uL (ref 150–400)
RBC: 4.32 MIL/uL (ref 4.22–5.81)
RDW: 16.1 % — AB (ref 11.5–15.5)
WBC: 12.3 10*3/uL — ABNORMAL HIGH (ref 4.0–10.5)

## 2014-05-13 LAB — VANCOMYCIN, TROUGH: Vancomycin Tr: 28.8 ug/mL (ref 10.0–20.0)

## 2014-05-13 LAB — GLUCOSE, CAPILLARY
GLUCOSE-CAPILLARY: 148 mg/dL — AB (ref 70–99)
GLUCOSE-CAPILLARY: 167 mg/dL — AB (ref 70–99)
GLUCOSE-CAPILLARY: 240 mg/dL — AB (ref 70–99)
Glucose-Capillary: 150 mg/dL — ABNORMAL HIGH (ref 70–99)
Glucose-Capillary: 193 mg/dL — ABNORMAL HIGH (ref 70–99)
Glucose-Capillary: 159 mg/dL — ABNORMAL HIGH (ref 70–99)

## 2014-05-13 LAB — BASIC METABOLIC PANEL
ANION GAP: 14 (ref 5–15)
BUN: 28 mg/dL — ABNORMAL HIGH (ref 6–23)
CALCIUM: 8.8 mg/dL (ref 8.4–10.5)
CO2: 29 meq/L (ref 19–32)
Chloride: 106 mEq/L (ref 96–112)
Creatinine, Ser: 2.03 mg/dL — ABNORMAL HIGH (ref 0.50–1.35)
GFR calc non Af Amer: 32 mL/min — ABNORMAL LOW (ref >90)
GFR, EST AFRICAN AMERICAN: 37 mL/min — AB (ref >90)
Glucose, Bld: 179 mg/dL — ABNORMAL HIGH (ref 70–99)
Potassium: 4 mEq/L (ref 3.7–5.3)
Sodium: 149 mEq/L — ABNORMAL HIGH (ref 137–147)

## 2014-05-13 LAB — PHOSPHORUS: Phosphorus: 5 mg/dL — ABNORMAL HIGH (ref 2.3–4.6)

## 2014-05-13 LAB — MAGNESIUM: Magnesium: 2.4 mg/dL (ref 1.5–2.5)

## 2014-05-13 LAB — AMMONIA: Ammonia: 32 umol/L (ref 11–60)

## 2014-05-13 MED ORDER — VANCOMYCIN HCL 500 MG IV SOLR
500.0000 mg | Freq: Two times a day (BID) | INTRAVENOUS | Status: DC
Start: 2014-05-14 — End: 2014-05-15
  Administered 2014-05-14 – 2014-05-15 (×3): 500 mg via INTRAVENOUS
  Filled 2014-05-13 (×4): qty 500

## 2014-05-13 NOTE — Progress Notes (Signed)
RT note- tolerated wean well, placed back to full support for excessive secretions, will rest and re start wean. Patient did follow commands today.

## 2014-05-13 NOTE — Progress Notes (Signed)
Patient's blue and white ports of his CVC occluded with no blood return noted. IV team consulted. Unable to flush or draw back blood as well. Brown port still infusing with blood return. E-link MD (Dr. Sherene Sires) notified.

## 2014-05-13 NOTE — Progress Notes (Signed)
CRITICAL VALUE ALERT  Critical value received:  Vancomycin Trough 28.8    Date of notification:  05/13/14  Time of notification:  2300  Critical value read back:Yes.    Nurse who received alert:  [place], RN  MD notified (1st page):  Pharmacist Notified  Time of first page:  2301  MD notified (2nd page):  Time of second page:  Responding MD:  On call pharmacist  Time MD responded:  2301  Ordered to hold 2200 Vancomycin dose.

## 2014-05-13 NOTE — Progress Notes (Signed)
PULMONARY / CRITICAL CARE MEDICINE   Name: Jonathon Galvan MRN: [id number] DOB: 06-02-1946    ADMISSION DATE:  05/04/2014  INITIAL PRESENTATION:  89 M with extensive PMH inc NICM admitted after VF arrest. Hypothermia protocol initiated on admission. 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 LVEF approx 40-45% 11/12 Hypothermia protocol implemented   11/12 CT head: NAICP 11/12 CT neck: No cervical spine fracture or traumatic subluxation. Advanced multilevel cervical spondylosis is noted 11/14 echo>>>30% to 35%. Dyskinesis of the mid-apicalanteroseptal myocardium. Doppler parameters are consistent with abnormal left ventricular relaxation (grade 1 diastolic dysfunction). 11/16 eeg>>>no seizure, generalized slowing, c/w hypoxic encephalopathy  11/17: several nurses holding him down. Very agitated. Still w/ copious secretions from ETT. Started on diprivan, precedex stopped. Also started on clonazepam and Seroquel.   INDWELLING DEVICES: R radial A-line 11/12 >> 11/13 ETT 11/12 >>  R femoral CVL 11/12 >> 11/15 R femoral arterial sheath 11/12 >> 11/15 Left IJ CVL 11/16>>>  SUBJECTIVE:  Withdraws to pain, no events overnight, for MRI today, remains on propofol.  VITAL SIGNS: Temp:  [99.3 F (37.4 C)-100.6 F (38.1 C)] 100 F (37.8 C) (11/21 1000) Pulse Rate:  [29-89] 86 (11/21 1000) Resp:  [14-34] 24 (11/21 1000) BP: (95-141)/(47-65) 114/64 mmHg (11/21 1000) SpO2:  [79 %-100 %] 93 % (11/21 1000) FiO2 (%):  [40 %] 40 % (11/21 0844) Weight:  [86.3 kg (190 lb 4.1 oz)] 86.3 kg (190 lb 4.1 oz) (11/21 0400)  HEMODYNAMICS:    VENTILATOR SETTINGS: Vent Mode:  [-] PSV;CPAP FiO2 (%):  [40 %] 40 % Set Rate:  [12 bmp] 12 bmp Vt Set:  [620 mL] 620 mL PEEP:  [5 cmH20] 5 cmH20 Pressure Support:  [8 cmH20] 8 cmH20 Plateau Pressure:  [11 cmH20-20 cmH20] 18  cmH20  INTAKE / OUTPUT:  Intake/Output Summary (Last 24 hours) at 05/13/14 1033 Last data filed at 05/13/14 1000  Gross per 24 hour  Intake 2985.5 ml  Output   3945 ml  Net -959.5 ml   PHYSICAL EXAMINATION: General: Sedated, intubated, moving ext to command but slow to respond. Neuro: Moving all ext command but slow to respond. HEENT: superficial scalp abrasion, C collar in place. Cardiovascular:  s1 s2 RRR. Lungs: ronchi t/o. Abdomen: soft, diminished BS. 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  CBC Recent Labs     05/11/14  0350  05/12/14  0430  05/13/14  0400  WBC  10.8*  11.4*  12.3*  HGB  10.8*  11.4*  11.3*  HCT  34.4*  36.6*  36.0*  PLT  197  220  213   Coag's No results for input(s): APTT, INR in the last 72 hours.  BMET Recent Labs     05/11/14  0350  05/12/14  0430  05/13/14  0400  NA  153*  152*  149*  K  4.3  3.9  4.0  CL  118*  111  106  CO2  24  28  29   BUN  19  22  28*  CREATININE  1.68*  1.96*  2.03*  GLUCOSE  151*  174*  179*   Electrolytes Recent Labs     05/11/14  0350  05/12/14  0430  05/13/14  0400  CALCIUM  8.1*  8.9  8.8  MG  2.3  2.5  2.4  PHOS  3.7  5.3*  5.0*  Sepsis Markers Recent Labs     05/11/14  0350  PROCALCITON  0.99   ABG Recent Labs     05/11/14  0320  05/12/14  0435  05/13/14  0500  PHART  7.298*  7.361  7.362  PCO2ART  53.1*  51.2*  54.2*  PO2ART  98.6  69.8*  86.1   Liver Enzymes No results for input(s): AST, ALT, ALKPHOS, BILITOT, ALBUMIN in the last 72 hours. Cardiac Enzymes No results for input(s): TROPONINI, PROBNP in the last 72 hours.  Glucose Recent Labs     05/12/14  0748  05/12/14  1200  05/12/14  1604  05/12/14  1953  05/13/14  0005  05/13/14  0403  GLUCAP  175*  211*  164*  179*  240*  167*   Imaging Mr Brain Wo Contrast  05/11/2014   CLINICAL DATA:  68 year old male status post cardiac arrest an resuscitation with persistent encephalopathy, anoxic brain injury suspected. Initial  encounter.  EXAM: MRI HEAD WITHOUT CONTRAST  TECHNIQUE: Multiplanar, multiecho pulse sequences of the brain and surrounding structures were obtained without intravenous contrast.  COMPARISON:  Head CT without contrast 05/09/2004. Brain MRI 08/11/2007.  FINDINGS: No restricted diffusion or evidence of acute infarction. Signal in the deep gray matter nuclei is stable and normal for age except for incidental perivascular spaces. Brainstem and cerebellum are stable and within normal limits. There is patchy T2 and FLAIR hyperintensity in the cerebral white matter which has progressed since 2009, but is nonspecific. These areas show facilitated diffusion.  No acute intracranial hemorrhage identified. No midline shift, mass effect, or evidence of intracranial mass lesion. No ventriculomegaly. Negative pituitary and cervicomedullary junction. Negative for age visualized cervical spine. Normal bone marrow signal.  Fluid in the pharynx. The patient is intubated. Mild bilateral mastoid effusions. Fluid in mucosal thickening in the dependent paranasal sinuses. Postoperative change to the left globe. Other orbits soft tissues are within normal limits. Visualized scalp soft tissues are within normal limits.  IMPRESSION: 1. No evidence of anoxic injury or acute intracranial abnormality. 2. Progressed but nonspecific cerebral white matter signal changes since 2009, most commonly due to chronic small vessel disease. 3. Intubated, with paranasal sinus and mastoid inflammatory changes.   Electronically Signed   By: Augusto Gamble M.D.   On: 05/11/2014 16:52   Dg Chest Port 1 View  05/13/2014   CLINICAL DATA:  Endotracheal tube placement.  EXAM: PORTABLE CHEST - 1 VIEW  COMPARISON:  05/12/2014  FINDINGS: Endotracheal tube has tip approximately 5.3 cm above the carina. Nasogastric tube courses into the region of the stomach and off the film as tip is not visualized. Left IJ central venous catheter is unchanged with tip obliquely oriented  over the region of the SVC. Lungs are adequately inflated with mild left basilar opacification suggesting a small amount left pleural fluid/atelectasis, although cannot exclude infection. Cardiomediastinal silhouette and remainder of the exam is unchanged.  IMPRESSION: Persistent mild left basilar opacification suggesting effusion with atelectasis, although cannot exclude infection.  Tubes and lines as described.   Electronically Signed   By: Elberta Fortis M.D.   On: 05/13/2014 09:03   Dg Chest Port 1 View  05/12/2014   CLINICAL DATA:  Shortness of breath and pneumonia, intubated patient  EXAM: PORTABLE CHEST - 1 VIEW  COMPARISON:  Portable chest x-ray of May 11, 2014  FINDINGS: The lungs are well-expanded. The left hemidiaphragm the left lateral costophrenic gutter however less well demonstrated today. The pulmonary interstitial markings  are mildly increased though stable. Cardiopericardial silhouette is enlarged. There is tortuosity of the descending thoracic aorta.  The endotracheal tube tip lies 3.6 cm above the crotch of the carina. The esophagogastric tube tip projects below the inferior margin of the image. Left internal jugular venous catheter tip projects over the midportion of the SVC.  The abnormality associated with the lateral aspect of the right fifth rib is unchanged. There is no pneumothorax.  IMPRESSION: Mildly increased density at the left lung base is consistent with atelectasis and small pleural effusion. There is stable enlargement of the cardiac silhouette and mild pulmonary interstitial edema.   Electronically Signed   By: David  [place]   On: 05/12/2014 08:00   [id number]   ASSESSMENT / PLAN:  PULMONARY A: Acute respiratory failure s/p cardiopulmonary arrest.  Aspiration PNA Diuresed very well overnight P:   Continue PS trials, no extubation until mental status improves. Family discussing trach/peg options and will meet with family on Monday. See Neuro section. PAD  protocol for RAS goal -2 See ID section    CARDIOVASCULAR A:  VF cardiac arrest Previously documented NICM: EF 30-35%  Chronic LBBB Sinus bradycardia Ventricular ectopy Cardiogenic shock-->shock resolved  P:  Tele. Pending neuro situation will need ICD Cont coreg and Lipitor. Negative fluid balance goal. Continue Bidil at 2 tablets BID.  RENAL A:   CRI w/ acute on chronic injury  scr rising after diuresis  Hypernatremia  Hypokalemia   P:   Chem in am. Lasix 40 mg IV q8 x2 doses. Free water increased to 300 q4 hrs. Replace electrolytes as indicated. Hold further diureses given renal function.  GASTROINTESTINAL A:   Npo 11/14, hep c? Diet  Ammonia 61 on 11/17 P:   SUP: IV PPI Tf to goal Hep C Ab positive. Decrease lactulose now that more stable. Ammonia level stable, will continue lactulose, no need for additional checks.  HEMATOLOGIC A:   Mild anemia  P:  DVT px: SQ heparin Monitor CBC intermittently Transfuse per usual ICU guidelines  INFECTIOUS A:   Aspiration PNA  P:   Trend PCT  Unasyn 11/16>>>11/17 Rocephin 11/17>>>11/22 Sputum 11/16>>>NTD BC 11/12>>>NTD  ENDOCRINE A:   DM 2 P:   Cont SSI  NEUROLOGIC A:   Post anoxic encephalopathy H/o cocaine, methadone (active?) P:   RASS goal: -2 D/Ced propofol Versed PRN Fentanyl drip Continue clonazepam Increased seroquel to 200 BID Keep collar on until can clear clinicaly Neuro consulted and following appreciate input  FAMILY  - Will speak with family early next week for trach/peg.  My CC time of 35 min.  Alyson Reedy, M.D. [facility] Pulmonary/Critical Care Medicine. Pager: [phone]. After hours pager: [phone].

## 2014-05-13 NOTE — Progress Notes (Signed)
Subjective:  No response for me. Earlier followed some neurology commands.   Objective:  Vital Signs in the last 24 hours: Temp:  [98.4 F (36.9 C)-100.6 F (38.1 C)] 99.3 F (37.4 C) (11/21 0800) Pulse Rate:  [29-89] 80 (11/21 0800) Resp:  [14-34] 16 (11/21 0800) BP: (95-137)/(47-66) 133/64 mmHg (11/21 0800) SpO2:  [79 %-100 %] 97 % (11/21 0800) FiO2 (%):  [40 %] 40 % (11/21 0800) Weight:  [190 lb 4.1 oz (86.3 kg)] 190 lb 4.1 oz (86.3 kg) (11/21 0400)  Intake/Output from previous day: 11/20 0701 - 11/21 0700 In: 2948.2 [I.V.:313.2; NG/GT:2175; IV Piggyback:460] Out: 3675 [Urine:2700; Stool:975]   Physical Exam: General: Well developed, well nourished, sedate on vent Head:  Normocephalic and atraumatic. Eyes closed Lungs: Clear to auscultation and percussion. Heart: Normal S1 and S2.  No murmur, rubs or gallops.  Abdomen: soft, non-tender, positive bowel sounds. Extremities: No clubbing or cyanosis. No edema. Neurologic: sedate    Lab Results:  Recent Labs  05/12/14 0430 05/13/14 0400  WBC 11.4* 12.3*  HGB 11.4* 11.3*  PLT 220 213    Recent Labs  05/12/14 0430 05/13/14 0400  NA 152* 149*  K 3.9 4.0  CL 111 106  CO2 28 29  GLUCOSE 174* 179*  BUN 22 28*  CREATININE 1.96* 2.03*     Imaging: Mr Brain Wo Contrast  05/11/2014   CLINICAL DATA:  68 year old male status post cardiac arrest an resuscitation with persistent encephalopathy, anoxic brain injury suspected. Initial encounter.  EXAM: MRI HEAD WITHOUT CONTRAST  TECHNIQUE: Multiplanar, multiecho pulse sequences of the brain and surrounding structures were obtained without intravenous contrast.  COMPARISON:  Head CT without contrast 05/09/2004. Brain MRI 08/11/2007.  FINDINGS: No restricted diffusion or evidence of acute infarction. Signal in the deep gray matter nuclei is stable and normal for age except for incidental perivascular spaces. Brainstem and cerebellum are stable and within normal limits.  There is patchy T2 and FLAIR hyperintensity in the cerebral white matter which has progressed since 2009, but is nonspecific. These areas show facilitated diffusion.  No acute intracranial hemorrhage identified. No midline shift, mass effect, or evidence of intracranial mass lesion. No ventriculomegaly. Negative pituitary and cervicomedullary junction. Negative for age visualized cervical spine. Normal bone marrow signal.  Fluid in the pharynx. The patient is intubated. Mild bilateral mastoid effusions. Fluid in mucosal thickening in the dependent paranasal sinuses. Postoperative change to the left globe. Other orbits soft tissues are within normal limits. Visualized scalp soft tissues are within normal limits.  IMPRESSION: 1. No evidence of anoxic injury or acute intracranial abnormality. 2. Progressed but nonspecific cerebral white matter signal changes since 2009, most commonly due to chronic small vessel disease. 3. Intubated, with paranasal sinus and mastoid inflammatory changes.   Electronically Signed   By: Augusto GambleLee  Hall M.D.   On: 05/11/2014 16:52   Dg Chest Port 1 View  05/12/2014   CLINICAL DATA:  Shortness of breath and pneumonia, intubated patient  EXAM: PORTABLE CHEST - 1 VIEW  COMPARISON:  Portable chest x-ray of May 11, 2014  FINDINGS: The lungs are well-expanded. The left hemidiaphragm the left lateral costophrenic gutter however less well demonstrated today. The pulmonary interstitial markings are mildly increased though stable. Cardiopericardial silhouette is enlarged. There is tortuosity of the descending thoracic aorta.  The endotracheal tube tip lies 3.6 cm above the crotch of the carina. The esophagogastric tube tip projects below the inferior margin of the image. Left internal jugular venous catheter  tip projects over the midportion of the SVC.  The abnormality associated with the lateral aspect of the right fifth rib is unchanged. There is no pneumothorax.  IMPRESSION: Mildly increased  density at the left lung base is consistent with atelectasis and small pleural effusion. There is stable enlargement of the cardiac silhouette and mild pulmonary interstitial edema.   Electronically Signed   By: David  [place]   On: 05/12/2014 08:00    Telemetry: No adverse rhythms Personally viewed.   Cardiac Studies:  Cath - no obstructive CAD Scheduled Meds: . antiseptic oral rinse  7 mL Mouth Rinse QID  . atorvastatin  10 mg Oral q1800  . carvedilol  12.5 mg Oral BID WC  . cefTRIAXone (ROCEPHIN)  IV  1 g Intravenous Q24H  . chlorhexidine  15 mL Mouth Rinse BID  . free water  300 mL Per Tube Q4H  . heparin subcutaneous  5,000 Units Subcutaneous 3 times per day  . insulin aspart  0-15 Units Subcutaneous 6 times per day  . isosorbide-hydrALAZINE  2 tablet Oral BID  . lactulose  10 g Per Tube BID  . midazolam  2 mg Intravenous Once  . pantoprazole sodium  40 mg Per Tube Q1200  . sodium chloride  10-40 mL Intracatheter Q12H  . valproate sodium  500 mg Intravenous Q12H  . vancomycin  750 mg Intravenous Q12H   Continuous Infusions: . sodium chloride Stopped (05/11/14 1600)  . feeding supplement (VITAL AF 1.2 CAL) 1,000 mL (05/12/14 1900)  . fentaNYL infusion INTRAVENOUS 50 mcg/hr (05/12/14 1900)  . midazolam (VERSED) infusion 1 mg/hr (05/12/14 1900)   PRN Meds:.sodium chloride, acetaminophen (TYLENOL) oral liquid 160 mg/5 mL, docusate **OR** docusate, fentaNYL, hydrALAZINE, midazolam, ondansetron (ZOFRAN) IV, sodium chloride  Assessment/Plan:  Principal Problem:   Cardiac arrest Active Problems:   Chronic hepatitis C   DM2 (diabetes mellitus, type 2)   Essential hypertension   Peripheral vascular disease   VF (ventricular fibrillation)   Nonischemic cardiomyopathy   Hx of AKA (above knee amputation)   Acute respiratory acidosis   Respiratory failure   Cerebral edema   Anoxic brain injury   Pneumonia   No new recommendations.  EF 30%. We will continue to follow and if  improvement in neurological status, he may become a candidate for device therapy including ICD with resynchronization. Free water repletion (Na elevated) Continue to follow.  Able to follow some commands per neuro Family meeting Monday to discuss Trach.   SKAINS, MARK 05/13/2014, 8:24 AM

## 2014-05-13 NOTE — Progress Notes (Signed)
ANTIBIOTIC CONSULT NOTE - FOLLOW UP  Pharmacy Consult for vancomycin Indication: pneumonia  Labs:  Recent Labs  05/11/14 0350 05/12/14 0430 05/13/14 0400  WBC 10.8* 11.4* 12.3*  HGB 10.8* 11.4* 11.3*  PLT 197 220 213  CREATININE 1.68* 1.96* 2.03*   Estimated Creatinine Clearance: 37.2 mL/min (by C-G formula based on Cr of 2.03).  Recent Labs  05/13/14 2115  VANCOTROUGH 28.8*     Microbiology: Recent Results (from the past 720 hour(s))  Blood culture (routine x 2)     Status: None   Collection Time: 05/04/14  5:35 PM  Result Value Ref Range Status   Specimen Description BLOOD  Final   Special Requests   Final    BOTTLES DRAWN AEROBIC AND ANAEROBIC 5CC RT SUBCLAVIAN   Culture  Setup Time   Final    05/05/2014 01:30 Performed at [facility]    Culture   Final    NO GROWTH 5 DAYS Performed at [facility]    Report Status 05/11/2014 FINAL  Final  MRSA PCR Screening     Status: None   Collection Time: 05/04/14  7:39 PM  Result Value Ref Range Status   MRSA by PCR NEGATIVE NEGATIVE Final    Comment:        The GeneXpert MRSA Assay (FDA approved for NASAL specimens only), is one component of a comprehensive MRSA colonization surveillance program. It is not intended to diagnose MRSA infection nor to guide or monitor treatment for MRSA infections.   Blood culture (routine x 2)     Status: None   Collection Time: 05/04/14  9:00 PM  Result Value Ref Range Status   Specimen Description BLOOD LEFT ARM  Final   Special Requests BOTTLES DRAWN AEROBIC ONLY 2CC  Final   Culture  Setup Time   Final    05/05/2014 04:08 Performed at [facility]    Culture   Final    NO GROWTH 5 DAYS Performed at [facility]    Report Status 05/11/2014 FINAL  Final  Culture, respiratory (NON-Expectorated)     Status: None   Collection Time: 05/07/14  4:40 PM  Result Value Ref Range Status   Specimen Description TRACHEAL ASPIRATE  Final   Special  Requests NONE  Final   Gram Stain   Final    ABUNDANT WBC PRESENT,BOTH PMN AND MONONUCLEAR RARE SQUAMOUS EPITHELIAL CELLS PRESENT MODERATE GRAM NEGATIVE COCCOBACILLI FEW GRAM POSITIVE COCCI IN PAIRS RARE GRAM NEGATIVE COCCI    Culture   Final    MODERATE METHICILLIN RESISTANT STAPHYLOCOCCUS AUREUS Note: RIFAMPIN AND GENTAMICIN SHOULD NOT BE USED AS SINGLE DRUGS FOR TREATMENT OF STAPH INFECTIONS. This organism DOES NOT demonstrate inducible Clindamycin resistance in vitro. CRITICAL RESULT CALLED TO, READ BACK BY AND VERIFIED WITH: SARA@7 :22AM ON  05/11/14 BY DANTS Performed at [facility]    Report Status 05/12/2014 FINAL  Final   Organism ID, Bacteria METHICILLIN RESISTANT STAPHYLOCOCCUS AUREUS  Final      Susceptibility   Methicillin resistant staphylococcus aureus - MIC*    CLINDAMYCIN <=0.25 SENSITIVE Sensitive     ERYTHROMYCIN >=8 RESISTANT Resistant     GENTAMICIN <=0.5 SENSITIVE Sensitive     LEVOFLOXACIN 0.25 SENSITIVE Sensitive     OXACILLIN >=4 RESISTANT Resistant     PENICILLIN >=0.5 RESISTANT Resistant     RIFAMPIN <=0.5 SENSITIVE Sensitive     TRIMETH/SULFA <=10 SENSITIVE Sensitive     VANCOMYCIN 1 SENSITIVE Sensitive     TETRACYCLINE <=  1 SENSITIVE Sensitive     * MODERATE METHICILLIN RESISTANT STAPHYLOCOCCUS AUREUS  Clostridium Difficile by PCR     Status: None   Collection Time: 05/10/14  1:13 PM  Result Value Ref Range Status   C difficile by pcr NEGATIVE NEGATIVE Final     Assessment: 68yo male supratherapeutic on vancomycin for MRSA PNA.  Goal of Therapy:  Vancomycin trough level 15-20 mcg/ml  Plan:  Will change vanc to 500mg  IV Q12H for calculated trough of ~19 and continue to monitor.  Vernard Gambles, PharmD, BCPS  05/13/2014,11:49 PM

## 2014-05-13 NOTE — Progress Notes (Signed)
Subjective: More awake this morning.   Exam: Filed Vitals:   05/13/14 0800  BP: 133/64  Pulse: 80  Temp: 99.3 F (37.4 C)  Resp: 16   Gen: In bed, NAD MS: opens eyes, follows commands to stick out tongue and wiggle toes, but does not follow with his arms.  KZ:SWFUX, corneals intact, eyes conjugate, fixates and tracks across midline.  Motor: moves all extremities with good strength. Sensory: responds to nox stim x 4.    Impression: 68 yo M with persistent encephalopathy following cardiac arrest. I suspect a multifactorial delirium including some degree of hypoxic insult.  I suspect that the seroquel and clonzepam were over sedating, given improvemetn since discontinuing them.  MRI showed no clear signs of injury.   Recommendations: 1) depakote 500mg  BID(for delirium) 2) I suspect that he does have some degree of potential for recovery based on his current exam, though recovery to independent functioning is by no means guaranteed.   Ritta Slot, MD [facility] [phone]  If 7pm- 7am, please page neurology on call as listed in AMION.

## 2014-05-14 ENCOUNTER — Inpatient Hospital Stay (HOSPITAL_COMMUNITY): Payer: PRIVATE HEALTH INSURANCE

## 2014-05-14 LAB — BLOOD GAS, ARTERIAL
ACID-BASE EXCESS: 5.3 mmol/L — AB (ref 0.0–2.0)
Bicarbonate: 29.7 mEq/L — ABNORMAL HIGH (ref 20.0–24.0)
Drawn by: 33176
FIO2: 0.4 %
O2 Saturation: 96.2 %
PEEP: 5 cmH2O
Patient temperature: 98.6
RATE: 12 resp/min
TCO2: 31.2 mmol/L (ref 0–100)
VT: 620 mL
pCO2 arterial: 47.8 mmHg — ABNORMAL HIGH (ref 35.0–45.0)
pH, Arterial: 7.41 (ref 7.350–7.450)
pO2, Arterial: 85 mmHg (ref 80.0–100.0)

## 2014-05-14 LAB — CBC
HCT: 38.2 % — ABNORMAL LOW (ref 39.0–52.0)
Hemoglobin: 12.3 g/dL — ABNORMAL LOW (ref 13.0–17.0)
MCH: 26.9 pg (ref 26.0–34.0)
MCHC: 32.2 g/dL (ref 30.0–36.0)
MCV: 83.6 fL (ref 78.0–100.0)
Platelets: 191 10*3/uL (ref 150–400)
RBC: 4.57 MIL/uL (ref 4.22–5.81)
RDW: 15.7 % — ABNORMAL HIGH (ref 11.5–15.5)
WBC: 13.3 10*3/uL — ABNORMAL HIGH (ref 4.0–10.5)

## 2014-05-14 LAB — BASIC METABOLIC PANEL
Anion gap: 9 (ref 5–15)
BUN: 18 mg/dL (ref 6–23)
CHLORIDE: 99 meq/L (ref 96–112)
CO2: 27 meq/L (ref 19–32)
Calcium: 8.2 mg/dL — ABNORMAL LOW (ref 8.4–10.5)
Creatinine, Ser: 1.3 mg/dL (ref 0.50–1.35)
GFR calc non Af Amer: 54 mL/min — ABNORMAL LOW (ref >90)
GFR, EST AFRICAN AMERICAN: 63 mL/min — AB (ref >90)
Glucose, Bld: 100 mg/dL — ABNORMAL HIGH (ref 70–99)
POTASSIUM: 3.9 meq/L (ref 3.7–5.3)
Sodium: 135 mEq/L — ABNORMAL LOW (ref 137–147)

## 2014-05-14 LAB — GLUCOSE, CAPILLARY
GLUCOSE-CAPILLARY: 152 mg/dL — AB (ref 70–99)
GLUCOSE-CAPILLARY: 131 mg/dL — AB (ref 70–99)
Glucose-Capillary: 114 mg/dL — ABNORMAL HIGH (ref 70–99)
Glucose-Capillary: 130 mg/dL — ABNORMAL HIGH (ref 70–99)
Glucose-Capillary: 174 mg/dL — ABNORMAL HIGH (ref 70–99)
Glucose-Capillary: 197 mg/dL — ABNORMAL HIGH (ref 70–99)

## 2014-05-14 LAB — PHOSPHORUS: PHOSPHORUS: 2.9 mg/dL (ref 2.3–4.6)

## 2014-05-14 LAB — MAGNESIUM: Magnesium: 1.8 mg/dL (ref 1.5–2.5)

## 2014-05-14 MED ORDER — FUROSEMIDE 10 MG/ML IJ SOLN
40.0000 mg | Freq: Three times a day (TID) | INTRAMUSCULAR | Status: AC
  Administered 2014-05-14 (×2): 40 mg via INTRAVENOUS
  Filled 2014-05-14 (×2): qty 4

## 2014-05-14 MED ORDER — CETYLPYRIDINIUM CHLORIDE 0.05 % MT LIQD: 7.0000 mL | Freq: Two times a day (BID) | OROMUCOSAL | Status: DC

## 2014-05-14 MED ORDER — CHLORHEXIDINE GLUCONATE 0.12 % MT SOLN
15.0000 mL | Freq: Two times a day (BID) | OROMUCOSAL | Status: DC
  Administered 2014-05-15: 15 mL via OROMUCOSAL
  Filled 2014-05-14: qty 15

## 2014-05-14 MED ORDER — HYDRALAZINE HCL 20 MG/ML IJ SOLN
20.0000 mg | Freq: Four times a day (QID) | INTRAMUSCULAR | Status: DC
  Administered 2014-05-14 – 2014-05-15 (×3): 20 mg via INTRAVENOUS
  Filled 2014-05-14 (×7): qty 1

## 2014-05-14 MED ORDER — PANTOPRAZOLE SODIUM 40 MG IV SOLR
40.0000 mg | Freq: Every day | INTRAVENOUS | Status: DC
  Administered 2014-05-14: 40 mg via INTRAVENOUS
  Filled 2014-05-14 (×3): qty 40

## 2014-05-14 MED ORDER — WHITE PETROLATUM GEL
Status: DC | PRN
  Administered 2014-05-14: 0.2 via TOPICAL

## 2014-05-14 MED ORDER — METOPROLOL TARTRATE 1 MG/ML IV SOLN: 5.0000 mg | INTRAVENOUS | Status: DC | PRN

## 2014-05-14 NOTE — Progress Notes (Signed)
eLink Physician-Brief Progress Note Patient Name: Jonathon Galvan DOB: 11-27-1945 MRN: [id number]   Date of Service  05/14/2014  HPI/Events of Note  Called for HTN.  Patient extubated today and feeding tube removed.  Unable to swallow.  Did not receive bidil.  BP now elevated.  eICU Interventions  d'c bidil Scheduled hydralazine IV until swallowing issues resolved     Intervention Category Intermediate Interventions: Hypertension - evaluation and management  Henry Russel, P 05/14/2014, 7:16 PM

## 2014-05-14 NOTE — Progress Notes (Signed)
Wasted in sink  200 ml fentanyl and 10 ml versed by  2 RNS.

## 2014-05-14 NOTE — Procedures (Signed)
Extubation Procedure Note  Patient Details:   Name: Jonathon Galvan DOB: [birth year] MRN: [id number]   Airway Documentation:  Airway 7.5 mm (Active)  Secured at (cm) 25 cm 05/14/2014  8:56 AM  Measured From Lips 05/14/2014  8:56 AM  Secured Location Right 05/14/2014  8:56 AM  Secured By [employer] 05/14/2014  8:56 AM  Tube Holder Repositioned Yes 05/14/2014  8:56 AM  Cuff Pressure (cm H2O) 26 cm H2O 05/14/2014  4:40 AM  Site Condition Dry 05/14/2014  8:56 AM    Evaluation  O2 sats: stable throughout Complications: No apparent complications Patient did tolerate procedure well. Bilateral Breath Sounds: Rhonchi Suctioning: Airway Yes  Placed on 4l/min    [name] 05/14/2014, 10:40 AM

## 2014-05-14 NOTE — Progress Notes (Signed)
Subjective:  Continues to improve per neuro note.   Objective:  Vital Signs in the last 24 hours: Temp:  [97.4 F (36.3 C)-100.2 F (37.9 C)] 97.4 F (36.3 C) (11/22 0700) Pulse Rate:  [53-86] 75 (11/22 0700) Resp:  [13-35] 14 (11/22 0700) BP: (112-158)/(48-74) 146/64 mmHg (11/22 0700) SpO2:  [84 %-100 %] 96 % (11/22 0700) FiO2 (%):  [40 %] 40 % (11/22 0856) Weight:  [191 lb 5.8 oz (86.8 kg)] 191 lb 5.8 oz (86.8 kg) (11/22 0439)  Intake/Output from previous day: 11/21 0701 - 11/22 0700 In: 3255 [I.V.:100; NG/GT:2800; IV Piggyback:355] Out: 1925 [Urine:1425; Stool:500]   Physical Exam: General: Well developed, well nourished, on vent Head:  Normocephalic and atraumatic. Eyes closed Lungs: Clear to auscultation and percussion. Heart: Normal S1 and S2.  No murmur, rubs or gallops.  Abdomen: soft, non-tender, positive bowel sounds. Extremities: No clubbing or cyanosis. No edema. Neurologic: moving around, eyes open    Lab Results:  Recent Labs  05/13/14 0400 05/14/14 0415  WBC 12.3* 13.3*  HGB 11.3* 12.3*  PLT 213 191    Recent Labs  05/13/14 0400 05/14/14 0415  NA 149* 135*  K 4.0 3.9  CL 106 99  CO2 29 27  GLUCOSE 179* 100*  BUN 28* 18  CREATININE 2.03* 1.30     Imaging: Dg Chest Port 1 View  05/14/2014   CLINICAL DATA:  Post cardiac catheterization, intubation, history CHF, coronary artery disease, diabetes, smoking, hypertension  EXAM: PORTABLE CHEST - 1 VIEW  COMPARISON:  Portable exam 0524 hr compared to 05/13/2014  FINDINGS: Tip of endotracheal tube projects 3.6 cm above carina.  Tip of LEFT jugular central venous catheter projects over SVC at the level of the azygos confluence.  Nasogastric tube extends into stomach.  Enlargement of cardiac silhouette with pulmonary vascular congestion.  Improved bibasilar aeration since previous study.  Upper lungs clear.  No pleural effusion or pneumothorax.  IMPRESSION: Improved bibasilar aeration versus  previous study.   Electronically Signed   By: Ulyses Southward M.D.   On: 05/14/2014 07:34   Dg Chest Port 1 View  05/13/2014   CLINICAL DATA:  Endotracheal tube placement.  EXAM: PORTABLE CHEST - 1 VIEW  COMPARISON:  05/12/2014  FINDINGS: Endotracheal tube has tip approximately 5.3 cm above the carina. Nasogastric tube courses into the region of the stomach and off the film as tip is not visualized. Left IJ central venous catheter is unchanged with tip obliquely oriented over the region of the SVC. Lungs are adequately inflated with mild left basilar opacification suggesting a small amount left pleural fluid/atelectasis, although cannot exclude infection. Cardiomediastinal silhouette and remainder of the exam is unchanged.  IMPRESSION: Persistent mild left basilar opacification suggesting effusion with atelectasis, although cannot exclude infection.  Tubes and lines as described.   Electronically Signed   By: Elberta Fortis M.D.   On: 05/13/2014 09:03    Telemetry: No adverse rhythms Personally viewed.   Cardiac Studies:  Cath - no obstructive CAD Scheduled Meds: . antiseptic oral rinse  7 mL Mouth Rinse QID  . atorvastatin  10 mg Oral q1800  . carvedilol  12.5 mg Oral BID WC  . chlorhexidine  15 mL Mouth Rinse BID  . free water  300 mL Per Tube Q4H  . heparin subcutaneous  5,000 Units Subcutaneous 3 times per day  . insulin aspart  0-15 Units Subcutaneous 6 times per day  . isosorbide-hydrALAZINE  2 tablet Oral BID  .  lactulose  10 g Per Tube BID  . midazolam  2 mg Intravenous Once  . pantoprazole sodium  40 mg Per Tube Q1200  . sodium chloride  10-40 mL Intracatheter Q12H  . valproate sodium  500 mg Intravenous Q12H  . vancomycin  500 mg Intravenous Q12H   Continuous Infusions: . sodium chloride Stopped (05/11/14 1600)  . feeding supplement (VITAL AF 1.2 CAL) 1,000 mL (05/14/14 0602)  . fentaNYL infusion INTRAVENOUS 50 mcg/hr (05/14/14 0800)  . midazolam (VERSED) infusion 2 mg/hr (05/14/14  0747)   PRN Meds:.sodium chloride, acetaminophen (TYLENOL) oral liquid 160 mg/5 mL, docusate **OR** docusate, fentaNYL, hydrALAZINE, midazolam, ondansetron (ZOFRAN) IV, sodium chloride  Assessment/Plan:  Principal Problem:   Cardiac arrest Active Problems:   Chronic hepatitis C   DM2 (diabetes mellitus, type 2)   Essential hypertension   Peripheral vascular disease   VF (ventricular fibrillation)   Nonischemic cardiomyopathy   Hx of AKA (above knee amputation)   Acute respiratory acidosis   Respiratory failure   Cerebral edema   Anoxic brain injury   Pneumonia   No new recommendations.  EF 30%. Bidil, Coreg as above.  We will continue to follow and if improvement in long term neurological status, he may become a candidate for device therapy including ICD with resynchronization. Free water repletion (Na elevated) Continue to follow.  May be able to be extubated soon.    SKAINS, MARK 05/14/2014, 9:47 AM

## 2014-05-14 NOTE — Progress Notes (Signed)
Subjective: Continues to improve  Exam: Filed Vitals:   05/14/14 0700  BP: 146/64  Pulse: 75  Temp: 97.4 F (36.3 C)  Resp: 14   Gen: In bed, NAD MS: opens eyes, follows commands to stick out tongue and wiggle toes, lifts arms to command"lift left arm" and "lift right arm" chooses correct arms to lift.  TK:WIOXB, corneals intact, eyes conjugate, fixates and tracks across midline in both direction.s.  Motor: moves all extremities with good strength. Sensory: responds to nox stim x 4.    Impression: 68 yo M with persistent encephalopathy following cardiac arrest. I suspect a multifactorial delirium including some degree of hypoxic insult.  I suspect that the seroquel and clonzepam were over sedating, given improvement since discontinuing them.  MRI showed no clear signs of injury. At this time, with his improvement, I would favor continued aggressive care as he has a significant chance of recovery.   Recommendations: 1) depakote 500mg  BID(for delirium) 2) I would favor continued aggressive care if family thinks that it would be in keeping with his wishes.   Ritta Slot, MD [facility] [phone]  If 7pm- 7am, please page neurology on call as listed in AMION.

## 2014-05-14 NOTE — Progress Notes (Signed)
PULMONARY / CRITICAL CARE MEDICINE   Name: Jonathon Galvan MRN: [id number] DOB: 30-Aug-1945    ADMISSION DATE:  05/04/2014  INITIAL PRESENTATION:  80 M with extensive PMH inc NICM admitted after VF arrest. Hypothermia protocol initiated on admission. 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 LVEF approx 40-45% 11/12 Hypothermia protocol implemented   11/12 CT head: NAICP 11/12 CT neck: No cervical spine fracture or traumatic subluxation. Advanced multilevel cervical spondylosis is noted 11/14 echo>>>30% to 35%. Dyskinesis of the mid-apicalanteroseptal myocardium. Doppler parameters are consistent with abnormal left ventricular relaxation (grade 1 diastolic dysfunction). 11/16 eeg>>>no seizure, generalized slowing, c/w hypoxic encephalopathy  11/17: several nurses holding him down. Very agitated. Still w/ copious secretions from ETT. Started on diprivan, precedex stopped. Also started on clonazepam and Seroquel.   INDWELLING DEVICES: R radial A-line 11/12 >> 11/13 ETT 11/12 >>  R femoral CVL 11/12 >> 11/15 R femoral arterial sheath 11/12 >> 11/15 Left IJ CVL 11/16>>>  SUBJECTIVE:  Withdraws to pain, no events overnight, for MRI today, remains on propofol.  VITAL SIGNS: Temp:  [97.4 F (36.3 C)-100.2 F (37.9 C)] 97.4 F (36.3 C) (11/22 0700) Pulse Rate:  [53-84] 74 (11/22 1000) Resp:  [13-35] 18 (11/22 1000) BP: (112-158)/(48-79) 124/79 mmHg (11/22 1000) SpO2:  [84 %-100 %] 94 % (11/22 1000) FiO2 (%):  [40 %] 40 % (11/22 0856) Weight:  [86.8 kg (191 lb 5.8 oz)] 86.8 kg (191 lb 5.8 oz) (11/22 0439)  HEMODYNAMICS:    VENTILATOR SETTINGS: Vent Mode:  [-] PSV;CPAP FiO2 (%):  [40 %] 40 % Set Rate:  [12 bmp] 12 bmp Vt Set:  [620 mL] 620 mL PEEP:  [5 cmH20] 5 cmH20 Pressure Support:  [5 cmH20-10 cmH20] 5 cmH20 Plateau Pressure:  [16 cmH20-24 cmH20] 20  cmH20  INTAKE / OUTPUT:  Intake/Output Summary (Last 24 hours) at 05/14/14 1030 Last data filed at 05/14/14 1000  Gross per 24 hour  Intake   3370 ml  Output   1475 ml  Net   1895 ml   PHYSICAL EXAMINATION: General: Intubated, moving ext to command. Neuro: Moving all available ext command but slow to respond. HEENT: superficial scalp abrasion, C collar in place. Cardiovascular:  s1 s2 RRR. Lungs: ronchi t/o. Abdomen: soft, diminished BS. 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  CBC Recent Labs     05/12/14  0430  05/13/14  0400  05/14/14  0415  WBC  11.4*  12.3*  13.3*  HGB  11.4*  11.3*  12.3*  HCT  36.6*  36.0*  38.2*  PLT  220  213  191   Coag's No results for input(s): APTT, INR in the last 72 hours.  BMET Recent Labs     05/12/14  0430  05/13/14  0400  05/14/14  0415  NA  152*  149*  135*  K  3.9  4.0  3.9  CL  111  106  99  CO2  28  29  27   BUN  22  28*  18  CREATININE  1.96*  2.03*  1.30  GLUCOSE  174*  179*  100*   Electrolytes Recent Labs     05/12/14  0430  05/13/14  0400  05/14/14  0415  CALCIUM  8.9  8.8  8.2*  MG  2.5  2.4  1.8  PHOS  5.3*  5.0*  2.9  Sepsis Markers No results for input(s): PROCALCITON, O2SATVEN in the last 72 hours.  Invalid input(s): LACTICACIDVEN ABG Recent Labs     05/12/14  0435  05/13/14  0500  05/14/14  0456  PHART  7.361  7.362  7.410  PCO2ART  51.2*  54.2*  47.8*  PO2ART  69.8*  86.1  85.0   Liver Enzymes No results for input(s): AST, ALT, ALKPHOS, BILITOT, ALBUMIN in the last 72 hours. Cardiac Enzymes No results for input(s): TROPONINI, PROBNP in the last 72 hours.  Glucose Recent Labs     05/13/14  1112  05/13/14  1545  05/13/14  1953  05/14/14  0027  05/14/14  0436  05/14/14  0822  GLUCAP  148*  193*  159*  197*  174*  130*   Imaging Dg Chest Port 1 View  05/14/2014   CLINICAL DATA:  Post cardiac catheterization, intubation, history CHF, coronary artery disease, diabetes, smoking,  hypertension  EXAM: PORTABLE CHEST - 1 VIEW  COMPARISON:  Portable exam 0524 hr compared to 05/13/2014  FINDINGS: Tip of endotracheal tube projects 3.6 cm above carina.  Tip of LEFT jugular central venous catheter projects over SVC at the level of the azygos confluence.  Nasogastric tube extends into stomach.  Enlargement of cardiac silhouette with pulmonary vascular congestion.  Improved bibasilar aeration since previous study.  Upper lungs clear.  No pleural effusion or pneumothorax.  IMPRESSION: Improved bibasilar aeration versus previous study.   Electronically Signed   By: Ulyses Southward M.D.   On: 05/14/2014 07:34   Dg Chest Port 1 View  05/13/2014   CLINICAL DATA:  Endotracheal tube placement.  EXAM: PORTABLE CHEST - 1 VIEW  COMPARISON:  05/12/2014  FINDINGS: Endotracheal tube has tip approximately 5.3 cm above the carina. Nasogastric tube courses into the region of the stomach and off the film as tip is not visualized. Left IJ central venous catheter is unchanged with tip obliquely oriented over the region of the SVC. Lungs are adequately inflated with mild left basilar opacification suggesting a small amount left pleural fluid/atelectasis, although cannot exclude infection. Cardiomediastinal silhouette and remainder of the exam is unchanged.  IMPRESSION: Persistent mild left basilar opacification suggesting effusion with atelectasis, although cannot exclude infection.  Tubes and lines as described.   Electronically Signed   By: Elberta Fortis M.D.   On: 05/13/2014 09:03   YDX:AJOINOMVE   ASSESSMENT / PLAN:  PULMONARY A: Acute respiratory failure s/p cardiopulmonary arrest.  Aspiration PNA Diuresed very well overnight P:   Extubate today. If fails then will reintubate and trach next week See Neuro section. PAD protocol for RAS goal -2 See ID section    CARDIOVASCULAR A:  VF cardiac arrest Previously documented NICM: EF 30-35%  Chronic LBBB Sinus bradycardia Ventricular  ectopy Cardiogenic shock-->shock resolved  P:  Tele. Pending neuro situation will need ICD, cards to decide. Cont coreg and Lipitor. Negative fluid balance goal. Continue Bidil at 2 tablets BID.  RENAL A:   CRI w/ acute on chronic injury  scr rising after diuresis  Hypernatremia  Hypokalemia   P:   Chem in am. Lasix 40 mg IV q8 x2 doses. Free water increased to 300 q4 hrs off since OGT is out. Replace electrolytes as indicated. Hold further diureses given renal function.  GASTROINTESTINAL A:   Npo 11/14, hep c? Diet  Ammonia 61 on 11/17 P:   SUP: IV PPI Tf to goal Hep C Ab positive. Hold lactulose given NPO status. Swallow evaluation.  Once able to take PO will need to restart lactulose.  HEMATOLOGIC A:   Mild anemia  P:  DVT px: SQ heparin Monitor CBC intermittently Transfuse per usual ICU guidelines  INFECTIOUS A:   Aspiration PNA  P:   Trend PCT  Unasyn 11/16>>>11/17 Rocephin 11/17>>>11/22 Sputum 11/16>>>NTD BC 11/12>>>NTD  ENDOCRINE A:   DM 2 P:   Cont SSI  NEUROLOGIC A:   Post anoxic encephalopathy H/o cocaine, methadone (active?) P:   RASS goal: -2 D/Ced propofol Versed PRN Fentanyl drip Continue clonazepam Increased seroquel to 200 BID No neck pain, palpation of neck stable, will remove cervical collar Neuro consulted and following appreciate input  FAMILY  - Will speak with family early next week for trach/peg.  My CC time of 35 min.  Alyson ReedyWesam G. Sherryl Valido, M.D. [facility]monary/Critical [facility] Medicine. Pager: [phone]. After hours pager: [phone].

## 2014-05-14 NOTE — Progress Notes (Signed)
 fent gtt and 7mL Versed gtt wasted in sink with Norwood Levo, RN.

## 2014-05-15 DIAGNOSIS — I5042 Chronic combined systolic (congestive) and diastolic (congestive) heart failure: Secondary | ICD-10-CM

## 2014-05-15 LAB — GLUCOSE, CAPILLARY
GLUCOSE-CAPILLARY: 121 mg/dL — AB (ref 70–99)
GLUCOSE-CAPILLARY: 171 mg/dL — AB (ref 70–99)
GLUCOSE-CAPILLARY: 167 mg/dL — AB (ref 70–99)
GLUCOSE-CAPILLARY: 163 mg/dL — AB (ref 70–99)
Glucose-Capillary: 118 mg/dL — ABNORMAL HIGH (ref 70–99)
Glucose-Capillary: 119 mg/dL — ABNORMAL HIGH (ref 70–99)

## 2014-05-15 LAB — BASIC METABOLIC PANEL
ANION GAP: 15 (ref 5–15)
BUN: 33 mg/dL — ABNORMAL HIGH (ref 6–23)
CHLORIDE: 105 meq/L (ref 96–112)
CO2: 29 mEq/L (ref 19–32)
CREATININE: 1.8 mg/dL — AB (ref 0.50–1.35)
Calcium: 9.2 mg/dL (ref 8.4–10.5)
GFR, EST AFRICAN AMERICAN: 42 mL/min — AB (ref >90)
GFR, EST NON AFRICAN AMERICAN: 36 mL/min — AB (ref >90)
Glucose, Bld: 120 mg/dL — ABNORMAL HIGH (ref 70–99)
POTASSIUM: 3.6 meq/L — AB (ref 3.7–5.3)
Sodium: 149 mEq/L — ABNORMAL HIGH (ref 137–147)

## 2014-05-15 LAB — CBC
HCT: 40.4 % (ref 39.0–52.0)
Hemoglobin: 12.9 g/dL — ABNORMAL LOW (ref 13.0–17.0)
MCH: 26.8 pg (ref 26.0–34.0)
MCHC: 31.9 g/dL (ref 30.0–36.0)
MCV: 84 fL (ref 78.0–100.0)
PLATELETS: 279 10*3/uL (ref 150–400)
RBC: 4.81 MIL/uL (ref 4.22–5.81)
RDW: 15.3 % (ref 11.5–15.5)
WBC: 14.8 10*3/uL — ABNORMAL HIGH (ref 4.0–10.5)

## 2014-05-15 LAB — MAGNESIUM: Magnesium: 2.7 mg/dL — ABNORMAL HIGH (ref 1.5–2.5)

## 2014-05-15 LAB — PHOSPHORUS: Phosphorus: 3.3 mg/dL (ref 2.3–4.6)

## 2014-05-15 MED ORDER — ALTEPLASE 2 MG IJ SOLR
2.0000 mg | Freq: Once | INTRAMUSCULAR | Status: DC
  Filled 2014-05-15: qty 2

## 2014-05-15 MED ORDER — POTASSIUM CHLORIDE 10 MEQ/100ML IV SOLN: 10.0000 meq | INTRAVENOUS | Status: DC

## 2014-05-15 MED ORDER — NICOTINE 7 MG/24HR TD PT24
7.0000 mg | MEDICATED_PATCH | Freq: Every day | TRANSDERMAL | Status: DC
  Administered 2014-05-15 – 2014-05-21 (×7): 7 mg via TRANSDERMAL
  Filled 2014-05-15 (×7): qty 1

## 2014-05-15 MED ORDER — ISOSORB DINITRATE-HYDRALAZINE 20-37.5 MG PO TABS
2.0000 | ORAL_TABLET | Freq: Two times a day (BID) | ORAL | Status: DC
  Administered 2014-05-15 – 2014-05-21 (×12): 2 via ORAL
  Filled 2014-05-15 (×5): qty 2
  Filled 2014-05-15: qty 1
  Filled 2014-05-15 (×6): qty 2
  Filled 2014-05-15: qty 1
  Filled 2014-05-15: qty 2

## 2014-05-15 MED ORDER — VANCOMYCIN HCL 500 MG IV SOLR
500.0000 mg | Freq: Two times a day (BID) | INTRAVENOUS | Status: AC
  Administered 2014-05-15 – 2014-05-18 (×6): 500 mg via INTRAVENOUS
  Filled 2014-05-15 (×10): qty 500

## 2014-05-15 MED ORDER — POTASSIUM CHLORIDE 10 MEQ/50ML IV SOLN
10.0000 meq | INTRAVENOUS | Status: AC
  Administered 2014-05-15 (×4): 10 meq via INTRAVENOUS
  Filled 2014-05-15: qty 50

## 2014-05-15 MED ORDER — ENSURE COMPLETE PO LIQD
237.0000 mL | Freq: Two times a day (BID) | ORAL | Status: DC
  Administered 2014-05-15 – 2014-05-21 (×11): 237 mL via ORAL

## 2014-05-15 MED ORDER — DEXTROSE 5 % IV SOLN: INTRAVENOUS | Status: DC

## 2014-05-15 NOTE — Evaluation (Signed)
Physical Therapy Evaluation Patient Details Name: Jonathon Galvan MRN: [id number] DOB: 1946/05/01 Today's Date: 05/15/2014   History of Present Illness  Pt adm with cardiac arrest and hypothermia protocol initiated. Pt extubated 11/22. PMH - lt AKA, CHF, CAD, Asthma; Allergy; PVD (peripheral vascular disease); Diabetes mellitus; Tobacco abuse; Hepatitis C; Hypertension; LBBB (left bundle branch block); Anemia, Cocaine use  Clinical Impression  Pt admitted with above. Pt currently with functional limitations due to the deficits listed below (see PT Problem List).  Pt will benefit from skilled PT to increase their independence and safety with mobility to allow discharge to the venue listed below. Pt very "floppy" in trunk and with difficulty grading movements. Per sister there is good family support. Feel pt is good CIR candidate.      Follow Up Recommendations CIR    Equipment Recommendations  Other (comment) (to be assessed)    Recommendations for Other Services       Precautions / Restrictions Precautions Precautions: Fall      Mobility  Bed Mobility Overal bed mobility: Needs Assistance Bed Mobility: Supine to Sit;Sit to Sidelying     Supine to sit: +2 for physical assistance;Mod assist;HOB elevated   Sit to sidelying: +2 for physical assistance;Mod assist General bed mobility comments: Assist to bring leg off EOB and to elevate trunk and bring hips to EOB. Assist to control descent of trunk and to bring rt leg back up into bed.  Transfers                    Ambulation/Gait                Stairs            Wheelchair Mobility    Modified Rankin (Stroke Patients Only)       Balance Overall balance assessment: Needs assistance Sitting-balance support: Bilateral upper extremity supported;Feet supported Sitting balance-Leahy Scale: Poor Sitting balance - Comments: Pt required mod A to sit EOB x 8 minutes. Pt with head down and required  verbal/tactile cues to hold head up. Pt flexed posture and leaning in all directions with very slow or absent balance reactions. Postural control: Posterior lean;Right lateral lean;Left lateral lean;Other (comment) (anterior lean)                                   Pertinent Vitals/Pain Pain Assessment: No/denies pain    Home Living Family/patient expects to be discharged to:: Private residence Living Arrangements: Alone Available Help at Discharge: Family;Available 24 hours/day Type of Home: House Home Access: Level entry     Home Layout: One level Home Equipment: Cane - single point;Wheelchair - manual      Prior Function Level of Independence: Independent with assistive device(s)         Comments: Amb with cane and prosthesis.     Hand Dominance        Extremity/Trunk Assessment   Upper Extremity Assessment: Defer to OT evaluation           Lower Extremity Assessment: Generalized weakness;LLE deficits/detail   LLE Deficits / Details: Pt AKA.     Communication   Communication: No difficulties  Cognition Arousal/Alertness: Lethargic Behavior During Therapy: Flat affect Overall Cognitive Status: Impaired/Different from baseline Area of Impairment: Orientation;Attention;Memory;Following commands;Safety/judgement;Awareness;Problem solving Orientation Level: Disoriented to;Situation;Time Current Attention Level: Focused Memory: Decreased recall of precautions;Decreased short-term memory Following Commands: Follows one step commands inconsistently;Follows one  step commands with increased time Safety/Judgement: Decreased awareness of safety;Decreased awareness of deficits   Problem Solving: Slow processing;Decreased initiation;Requires verbal cues;Requires tactile cues      General Comments      Exercises        Assessment/Plan    PT Assessment Patient needs continued PT services  PT Diagnosis Difficulty walking;Generalized  weakness;Altered mental status   PT Problem List Decreased strength;Decreased knowledge of use of DME;Decreased activity tolerance;Decreased safety awareness;Decreased knowledge of precautions;Decreased balance;Decreased mobility;Decreased coordination;Decreased cognition  PT Treatment Interventions DME instruction;Gait training;Cognitive remediation;Patient/family education;Functional mobility training;Therapeutic activities;Therapeutic exercise;Balance training;Wheelchair mobility training   PT Goals (Current goals can be found in the Care Plan section) Acute Rehab PT Goals Patient Stated Goal: Pt didn't state PT Goal Formulation: Patient unable to participate in goal setting Time For Goal Achievement: 05/22/14 Potential to Achieve Goals: Good    Frequency Min 3X/week   Barriers to discharge        Co-evaluation               End of Session Equipment Utilized During Treatment: Oxygen Activity Tolerance: Patient limited by fatigue;Patient limited by lethargy Patient left: in bed;with call bell/phone within reach;with bed alarm set Nurse Communication: Mobility status         Time: 1520-1535 PT Time Calculation (min) (ACUTE ONLY): 15 min   Charges:   PT Evaluation $Initial PT Evaluation Tier I: 1 Procedure PT Treatments $Therapeutic Activity: 8-22 mins   PT G Codes:          Meerab Maselli 05/15/2014, 4:10 PM  [employer] Corporation[employer]ary Elin Seats PT [phone]

## 2014-05-15 NOTE — Progress Notes (Signed)
SUBJECTIVE:  Pt seen and examined in AM. He was extubated yesterday and is now alert, orientated, and conversant. His telemetry reveals NSR/chronic BBB. His blood pressure and heart rate remain stable. He received two doses of IV lasix 40 mg yesterday with 2.6L of urine output. He is to have swallow evaluation today as he was unable to swallow yesterday and consequently unable to take his PO medications.  OBJECTIVE:   Vitals:   Filed Vitals:   05/15/14 0800 05/15/14 0832 05/15/14 0900 05/15/14 1000  BP: 120/80 150/76 144/50 97/73  Pulse: 79  74 77  Temp: 98.1 F (36.7 C)     TempSrc: Oral     Resp: 20  18   Height:      Weight:      SpO2: 99%  97% 94%   I&O's:    Intake/Output Summary (Last 24 hours) at 05/15/14 1143 Last data filed at 05/15/14 1000  Gross per 24 hour  Intake    815 ml  Output   2650 ml  Net  -1835 ml   TELEMETRY: Reviewed telemetry pt in BBB and SB:     PHYSICAL EXAM General: Resting comfortably in bed Head:   Large contusion on right side of head.    Lungs:  Decreased breath sound at bases. Heart:  HRRR S1 S2  No JVD.   Abdomen: Abdomen soft and non-tender Msk:  Left AKA   Extremities:  SCD on right LE. No edema of RLE. Neuro: Alert & orientated x 3  Psych:  Normal affect Skin: No rash   LABS: Basic Metabolic Panel:  Recent Labs  16/03/9610/22/15 0415 05/15/14 0500  NA 135* 149*  K 3.9 3.6*  CL 99 105  CO2 27 29  GLUCOSE 100* 120*  BUN 18 33*  CREATININE 1.30 1.80*  CALCIUM 8.2* 9.2  MG 1.8 2.7*  PHOS 2.9 3.3   Liver Function Tests: No results for input(s): AST, ALT, ALKPHOS, BILITOT, PROT, ALBUMIN in the last 72 hours. No results for input(s): LIPASE, AMYLASE in the last 72 hours. CBC:  Recent Labs  05/14/14 0415 05/15/14 0500  WBC 13.3* 14.8*  HGB 12.3* 12.9*  HCT 38.2* 40.4  MCV 83.6 84.0  PLT 191 279   Cardiac Enzymes: No results for input(s): CKTOTAL, CKMB, CKMBINDEX, TROPONINI in the last 72 hours. BNP: Invalid  input(s): POCBNP D-Dimer: No results for input(s): DDIMER in the last 72 hours. Hemoglobin A1C: No results for input(s): HGBA1C in the last 72 hours. Fasting Lipid Panel: No results for input(s): CHOL, HDL, LDLCALC, TRIG, CHOLHDL, LDLDIRECT in the last 72 hours. Thyroid Function Tests: No results for input(s): TSH, T4TOTAL, T3FREE, THYROIDAB in the last 72 hours.  Invalid input(s): FREET3 Anemia Panel: No results for input(s): VITAMINB12, FOLATE, FERRITIN, TIBC, IRON, RETICCTPCT in the last 72 hours. Coag Panel:   Lab Results  Component Value Date   INR 1.23 05/05/2014   INR 1.23 05/04/2014   INR 1.10 06/20/2010    RADIOLOGY: Ct Head Wo Contrast  05/09/2014   CLINICAL DATA:  Cerebral edema.  Status post cardiac arrest.  EXAM: CT HEAD WITHOUT CONTRAST  TECHNIQUE: Contiguous axial images were obtained from the base of the skull through the vertex without intravenous contrast.  COMPARISON:  CT scan of May 04, 2014.  FINDINGS: Bony calvarium appears intact. Bilateral ethmoid and sphenoid and left maxillary sinusitis is noted. Mild diffuse cortical atrophy is noted. Mild chronic ischemic white matter disease is noted. No mass effect or midline  shift is noted. Ventricular size is within normal limits. There is no evidence of mass lesion, hemorrhage or acute infarction.  IMPRESSION: Mild diffuse cortical atrophy. Mild chronic ischemic white matter disease. Bilateral ethmoid and sphenoid sinusitis. No acute intracranial abnormality seen.   Electronically Signed   By: Roque Lias M.D.   On: 05/09/2014 15:17   Ct Head Wo Contrast  05/04/2014   CLINICAL DATA:  Syncopal episode. Patient found down. Out of hospital cardiac arrest.  EXAM: CT HEAD WITHOUT CONTRAST  CT CERVICAL SPINE WITHOUT CONTRAST  TECHNIQUE: Multidetector CT imaging of the head and cervical spine was performed following the standard protocol without intravenous contrast. Multiplanar CT image reconstructions of the cervical spine  were also generated.  COMPARISON:  Prior CT head from 08/10/2007.  FINDINGS: CT HEAD FINDINGS  No evidence for acute infarction, hemorrhage, mass lesion, hydrocephalus, or extra-axial fluid. Mild cerebral and cerebellar atrophy. Hypoattenuation of white matter is consistent with chronic microvascular ischemic change.  The calvarium is intact. There is no sinus or mastoid air fluid level although the ethmoids are opacified. Likely post intubation. The patient is intubated, with accompanying nasopharyngeal fluid.  CT CERVICAL SPINE FINDINGS  There is no visible cervical spine fracture, traumatic subluxation, prevertebral soft tissue swelling, or intraspinal hematoma. Mild straightening of the normal cervical lordosis. Mild ossification of the posterior longitudinal ligament. Multilevel disc space narrowing from C2 through T1. Moderately advanced facet arthropathy at C2-3 on the LEFT. Vascular calcification. Central venous line. Endotracheal tube. No pneumothorax. No worrisome neck mass.  IMPRESSION: Chronic changes as described. Similar appearance to 2009. No acute intracranial findings.  No cervical spine fracture or traumatic subluxation. Advanced multilevel cervical spondylosis is noted.  Visualized support tubes and lines appear appropriately placed.  Findings discussed with consulting MD.   Electronically Signed   By: Davonna Belling M.D.   On: 05/04/2014 18:26   Ct Cervical Spine Wo Contrast  05/04/2014   CLINICAL DATA:  Syncopal episode. Patient found down. Out of hospital cardiac arrest.  EXAM: CT HEAD WITHOUT CONTRAST  CT CERVICAL SPINE WITHOUT CONTRAST  TECHNIQUE: Multidetector CT imaging of the head and cervical spine was performed following the standard protocol without intravenous contrast. Multiplanar CT image reconstructions of the cervical spine were also generated.  COMPARISON:  Prior CT head from 08/10/2007.  FINDINGS: CT HEAD FINDINGS  No evidence for acute infarction, hemorrhage, mass lesion,  hydrocephalus, or extra-axial fluid. Mild cerebral and cerebellar atrophy. Hypoattenuation of white matter is consistent with chronic microvascular ischemic change.  The calvarium is intact. There is no sinus or mastoid air fluid level although the ethmoids are opacified. Likely post intubation. The patient is intubated, with accompanying nasopharyngeal fluid.  CT CERVICAL SPINE FINDINGS  There is no visible cervical spine fracture, traumatic subluxation, prevertebral soft tissue swelling, or intraspinal hematoma. Mild straightening of the normal cervical lordosis. Mild ossification of the posterior longitudinal ligament. Multilevel disc space narrowing from C2 through T1. Moderately advanced facet arthropathy at C2-3 on the LEFT. Vascular calcification. Central venous line. Endotracheal tube. No pneumothorax. No worrisome neck mass.  IMPRESSION: Chronic changes as described. Similar appearance to 2009. No acute intracranial findings.  No cervical spine fracture or traumatic subluxation. Advanced multilevel cervical spondylosis is noted.  Visualized support tubes and lines appear appropriately placed.  Findings discussed with consulting MD.   Electronically Signed   By: Davonna Belling M.D.   On: 05/04/2014 18:26   Mr Brain Wo Contrast  05/11/2014   CLINICAL DATA:  68 year old male status post cardiac arrest an resuscitation with persistent encephalopathy, anoxic brain injury suspected. Initial encounter.  EXAM: MRI HEAD WITHOUT CONTRAST  TECHNIQUE: Multiplanar, multiecho pulse sequences of the brain and surrounding structures were obtained without intravenous contrast.  COMPARISON:  Head CT without contrast 05/09/2004. Brain MRI 08/11/2007.  FINDINGS: No restricted diffusion or evidence of acute infarction. Signal in the deep gray matter nuclei is stable and normal for age except for incidental perivascular spaces. Brainstem and cerebellum are stable and within normal limits. There is patchy T2 and FLAIR  hyperintensity in the cerebral white matter which has progressed since 2009, but is nonspecific. These areas show facilitated diffusion.  No acute intracranial hemorrhage identified. No midline shift, mass effect, or evidence of intracranial mass lesion. No ventriculomegaly. Negative pituitary and cervicomedullary junction. Negative for age visualized cervical spine. Normal bone marrow signal.  Fluid in the pharynx. The patient is intubated. Mild bilateral mastoid effusions. Fluid in mucosal thickening in the dependent paranasal sinuses. Postoperative change to the left globe. Other orbits soft tissues are within normal limits. Visualized scalp soft tissues are within normal limits.  IMPRESSION: 1. No evidence of anoxic injury or acute intracranial abnormality. 2. Progressed but nonspecific cerebral white matter signal changes since 2009, most commonly due to chronic small vessel disease. 3. Intubated, with paranasal sinus and mastoid inflammatory changes.   Electronically Signed   By: Augusto Gamble M.D.   On: 05/11/2014 16:52   Dg Chest Port 1 View  05/14/2014   CLINICAL DATA:  Post cardiac catheterization, intubation, history CHF, coronary artery disease, diabetes, smoking, hypertension  EXAM: PORTABLE CHEST - 1 VIEW  COMPARISON:  Portable exam 0524 hr compared to 05/13/2014  FINDINGS: Tip of endotracheal tube projects 3.6 cm above carina.  Tip of LEFT jugular central venous catheter projects over SVC at the level of the azygos confluence.  Nasogastric tube extends into stomach.  Enlargement of cardiac silhouette with pulmonary vascular congestion.  Improved bibasilar aeration since previous study.  Upper lungs clear.  No pleural effusion or pneumothorax.  IMPRESSION: Improved bibasilar aeration versus previous study.   Electronically Signed   By: Ulyses Southward M.D.   On: 05/14/2014 07:34   Dg Chest Port 1 View  05/13/2014   CLINICAL DATA:  Endotracheal tube placement.  EXAM: PORTABLE CHEST - 1 VIEW  COMPARISON:   05/12/2014  FINDINGS: Endotracheal tube has tip approximately 5.3 cm above the carina. Nasogastric tube courses into the region of the stomach and off the film as tip is not visualized. Left IJ central venous catheter is unchanged with tip obliquely oriented over the region of the SVC. Lungs are adequately inflated with mild left basilar opacification suggesting a small amount left pleural fluid/atelectasis, although cannot exclude infection. Cardiomediastinal silhouette and remainder of the exam is unchanged.  IMPRESSION: Persistent mild left basilar opacification suggesting effusion with atelectasis, although cannot exclude infection.  Tubes and lines as described.   Electronically Signed   By: Elberta Fortis M.D.   On: 05/13/2014 09:03   Dg Chest Port 1 View  05/12/2014   CLINICAL DATA:  Shortness of breath and pneumonia, intubated patient  EXAM: PORTABLE CHEST - 1 VIEW  COMPARISON:  Portable chest x-ray of May 11, 2014  FINDINGS: The lungs are well-expanded. The left hemidiaphragm the left lateral costophrenic gutter however less well demonstrated today. The pulmonary interstitial markings are mildly increased though stable. Cardiopericardial silhouette is enlarged. There is tortuosity of the descending thoracic aorta.  The endotracheal tube  tip lies 3.6 cm above the crotch of the carina. The esophagogastric tube tip projects below the inferior margin of the image. Left internal jugular venous catheter tip projects over the midportion of the SVC.  The abnormality associated with the lateral aspect of the right fifth rib is unchanged. There is no pneumothorax.  IMPRESSION: Mildly increased density at the left lung base is consistent with atelectasis and small pleural effusion. There is stable enlargement of the cardiac silhouette and mild pulmonary interstitial edema.   Electronically Signed   By: David  [place]   On: 05/12/2014 08:00   Dg Chest Port 1 View  05/11/2014   CLINICAL DATA:  Intubated  patient with history of shortness of breath and pneumonia  EXAM: PORTABLE CHEST - 1 VIEW  COMPARISON:  Portable chest x-ray of May 10, 2014  FINDINGS: The lungs are well-expanded. The interstitial markings have improved dramatically. Minimal increased density persists in the retrocardiac region on the left. The cardiac silhouette remains enlarged. The pulmonary vascularity is more normal today. There is no significant pleural effusion. There is a stable appearance of the destructive lesion of the lateral aspect of the right fifth rib.  The endotracheal tube tip lies 2.9 cm above the crotch of the carina. The esophagogastric tube tip projects below the inferior margin of the image. The the observed bony thorax is unremarkable. Left internal jugular venous catheter tip projects over the proximal SVC.  IMPRESSION: Dramatic improvement in the appearance of the lungs since yesterday's study consistent with resolving interstitial edema and/or pneumonia. Persistent left lower lobe atelectasis or pneumonia is present. Abnormal contour of the lateral aspect of the right fifth rib is unchanged.   Electronically Signed   By: David  [place]   On: 05/11/2014 07:36   Dg Chest Port 1 View  05/10/2014   CLINICAL DATA:  68 year old male with shortness of breath and pneumonia. Initial encounter.  EXAM: PORTABLE CHEST - 1 VIEW  COMPARISON:  05/09/2014 and earlier.  FINDINGS: Portable AP semi upright view at 0438 hrs. Stable endotracheal tube. Stable left IJ central line. Stable visualized enteric tube.  Continued dense retrocardiac opacity and veiling opacity at both lung bases. Stable pulmonary vascularity. No pneumothorax. The patient is more rotated to the right.  Evidence of right lateral fourth or fifth rib destruction (arrow). The right lateral ribs were normal on 08/10/2007 chest CT. No other acute osseous abnormality identified.  IMPRESSION: 1.  Stable lines and tubes. 2. Stable ventilation with bilateral pleural  effusions and lower lobe collapse or consolidation. 3. Destruction versus fracture of the right lateral fourth or fifth rib (arrow). Has there been recent CPR or is there a known malignancy?   Electronically Signed   By: Augusto Gamble M.D.   On: 05/10/2014 06:20   Dg Chest Port 1 View  05/09/2014   CLINICAL DATA:  Assess ET tube position.  EXAM: PORTABLE CHEST - 1 VIEW  COMPARISON:  05/08/2014  FINDINGS: The endotracheal tube is 5.3 cm above the carina. The NG tube is stable. The left IJ catheter is stable. Persistent cardiac enlargement and vascular congestion. Small pleural effusions and bibasilar atelectasis.  IMPRESSION: Stable support apparatus.  Persistent vascular congestion, mild edema, small effusions and bibasilar atelectasis.   Electronically Signed   By: Loralie Champagne M.D.   On: 05/09/2014 07:40   Dg Chest Port 1 View  05/08/2014   CLINICAL DATA:  68 year old male status post central line placement.  EXAM: PORTABLE CHEST - 1 VIEW  COMPARISON:  05/08/2014, 5:08 a.m.  FINDINGS: Cardiomediastinal silhouette unchanged in size and contour. Fullness of the central vasculature again evident.  Diffuse interstitial and airspace opacities. Improved aeration at the bases of the lungs.  Interval placement of left IJ central venous catheter, which terminates in the region of the left brachycephalic vein and superior vena cava confluence. No evidence of pneumothorax.  Gastric tube again projects over the mediastinum, terminating out of the field of view. The gastric port projects over the region of the stomach.  Endotracheal tube is unchanged terminating suitably above the carina.  IMPRESSION: Interval placement of left IJ central venous catheter, which terminates at the confluence of the superior vena cava and left brachycephalic vein. No evidence of pneumothorax.  Otherwise unchanged support apparatus.  Similar appearance of mixed interstitial and airspace disease, compatible with edema.  Improved aeration at  the bilateral lung bases.  Signed,  Yvone Neu. Loreta Ave, DO  [facility]  [facility]   Electronically Signed   By: Gilmer Mor D.O.   On: 05/08/2014 17:30   Dg Chest Port 1 View  05/08/2014   CLINICAL DATA:  Ventilator dependent respiratory failure. Acute respiratory acidosis. Shortness of breath. Prior history of CHF.  EXAM: PORTABLE CHEST - 1 VIEW  COMPARISON:  Portable chest x-rays yesterday dating back to 05/04/2014.  FINDINGS: Endotracheal tube tip in satisfactory position projecting approximately 3 cm above the carina. Nasogastric tube courses below the diaphragm into the stomach. Cardiac silhouette moderately to markedly enlarged but stable. Interval development of mild-to-moderate diffuse interstitial and airspace pulmonary edema since yesterday. Developing bilateral pleural effusions and associated dense passive atelectasis in the lower lobes.  IMPRESSION: 1. Support apparatus satisfactory. 2. New moderate CHF, with interstitial and airspace pulmonary edema and moderate-sized bilateral pleural effusions. Associated passive atelectasis in the lower lobes.   Electronically Signed   By: Hulan Saas M.D.   On: 05/08/2014 07:43   Dg Chest Port 1 View  05/07/2014   CLINICAL DATA:  Cardiac arrest, history of asthma  EXAM: PORTABLE CHEST - 1 VIEW  COMPARISON:  05/06/2014  FINDINGS: Cardiomediastinal silhouette is stable. Endotracheal tube in place with tip 4.5 cm above the carina. Stable NG tube position. Mild basilar atelectasis again noted. No segmental infiltrate or pulmonary edema  IMPRESSION: Stable support apparatus. Again noted mild basilar atelectasis. No segmental infiltrate or pulmonary edema.   Electronically Signed   By: Natasha Mead M.D.   On: 05/07/2014 11:22   Dg Chest Port 1 View  05/06/2014   CLINICAL DATA:  Respiratory failure  EXAM: PORTABLE CHEST - 1 VIEW  COMPARISON:  05/05/1949  FINDINGS: An endotracheal tube is again noted 2.8 cm  above the carina. A nasogastric catheter is noted within the stomach. The cardiac shadow remains enlarged. Mild bibasilar atelectatic changes are seen. No significant vascular congestion is noted.  IMPRESSION: Mild bibasilar atelectatic changes.  Tubes and lines as described.   Electronically Signed   By: Alcide Clever M.D.   On: 05/06/2014 07:22   Dg Chest Port 1 View  05/05/2014   CLINICAL DATA:  Evaluate airspace disease  EXAM: PORTABLE CHEST - 1 VIEW  COMPARISON:  05/04/2014  FINDINGS: Endotracheal tube tip between the clavicular heads and carina. A gastric suction tube enters the stomach. Right subclavian central line, malpositioned on the previous study, has been removed.  Unchanged cardiomegaly.  Stable aortic contours.  Pulmonary venous congestion has decreased but there is now hazy and streaky bibasilar opacities. No pneumothorax.  IMPRESSION: 1. New orogastric tube is in good position. 2. Improved pulmonary edema. 3. Worsening basilar aeration, likely layering small effusions and atelectasis. Pneumonia cannot be excluded.   Electronically Signed   By: Tiburcio Pea M.D.   On: 05/05/2014 08:00   Dg Chest Portable 1 View  05/04/2014   CLINICAL DATA:  Status post cardiac arrest and CPR.  Intubated.  EXAM: PORTABLE CHEST - 1 VIEW  COMPARISON:  06/13/2010.  FINDINGS: Interval endotracheal tube with its tip 3.6 cm above the carina. Breast of enlargement of the cardiac silhouette with increased prominence of the pulmonary vasculature and interstitial markings. No pleural fluid seen. No pneumothorax. An interval epicardial pacemaker lead is in place. Right subclavian catheter extending into the right neck, its tip not included.  IMPRESSION: 1. Malpositioned right subclavian catheter extending into the neck on the right. 2. Progressive cardiomegaly and changes of congestive heart failure. These results will be called to the ordering clinician or representative by the Radiologist Assistant, and  communication documented in the PACS or zVision Dashboard.   Electronically Signed   By: Gordan Payment M.D.   On: 05/04/2014 17:47   Dg Abd Portable 1v  05/04/2014   CLINICAL DATA:  Orogastric tube placement.  EXAM: PORTABLE ABDOMEN - 1 VIEW  COMPARISON:  08/11/2007.  FINDINGS: Orogastric tube tip in the mid to distal stomach. The visualized bowel gas pattern is normal. The patient is rotated to the right. Possible hiatal hernia and right basilar airspace opacity.  IMPRESSION: 1. Orogastric tube tip in the mid to distal stomach. 2. Possible hiatal hernia. 3. Mild patchy atelectasis or pneumonia at the right lung base.   Electronically Signed   By: Gordan Payment M.D.   On: 05/04/2014 21:39   Principal Problem:   Cardiac arrest Active Problems:   Chronic hepatitis C   DM2 (diabetes mellitus, type 2)   Essential hypertension   Peripheral vascular disease   VF (ventricular fibrillation)   Nonischemic cardiomyopathy   Hx of AKA (above knee amputation)   Acute respiratory acidosis   Respiratory failure   Cerebral edema   Anoxic brain injury   Pneumonia     ASSESSMENT: 68 yo man with pmh of non-ishemic cardiomyapthy with EF 30-35% on echo in 2011, HTN, HL, PVD, Type II DM s/p left AKA, past cocaine abuse, tobacco abuse, and chronic LBBB who presented with vfib arrest with ROSC found to have non-obstructive catherization.   PLAN:   Vfib Arrest s/p Non-obstructive cardiac catherization -  Pt s/p non-obstructive cardiac catherization and  hypothermic protocol. Etiology unclear, possible due to non-ischemic cardiomyopathy in setting of chronic LBBB. He had one prior cardiac arrest approximately 10 yrs ago per family. Due to marked improvement of neurological status, pt is possible candidate for ICD and will contact EP for consultation today.   Probable MRSA Pneumonia - Curently on IV vancomycin day 5 for MRSA found on tracheal aspirate. Last CXR was improved. Management per PCCM.  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 Also with grade 1 diastolic dysfunction. Pt with pmh of cocaine abuse. Last CXR improved. IV lasix per PCCM (received 2 doses of IV lasix 40 mg yesterday). Pt at home on lasix 80 mg BID and enalapril 20 mg BID. Due to marked improvement of neurological status, pt is possible candidate for ICD and will contact EP for consultation today.   Acute Encepalopathy - Much improved. Currently on depakote 500  mg BID per neurology. MRI brain without anoxic injury. Multifactorial per neurology. Hypernatremia - Na 149 today. Most likely hypovolemic in setting of recent IV lasix. Management per PCCM.  Hypertension - Currently hypertensive. Once passes swallow study to receive PO meds,  carvedilol 12.5 mg BID and bidil 20-37.5 mg 2 tabs BID. Pt at home on amlodipine 10 mg daily, enalapril 20 mg BID, lasix 80 mg BID, and coreg 25 mg BID.  Acute Normocytic Anemia - Hg stable at 12.9 below baseline 14. FOBT negative. UA with trace hematuria. Currently on SQ heparin for DVT ppx. Monitor for bleeding.  Nonoliguric AKI CKD Stage 3 - Cr increased from 1.3 to 1.8 after IV lasix 40 mg x 2 yesterday, above baseline 1.5 Etiology pre-renal due to volume depletion vs ischemic ATN.  Continue to monitor.  Hypokalemia - K 3.6 this AM. Repleted with IV potassium chloride x 4 runs this AM. Continue to monitor.  Hyperlipidemia - Continue home liptor 10 mg daily.  PVD - Pt at home on aspirin 81 mg daily. Hold in setting of acute anemia.  Non-Insulin Dependent Type II DM - A1c 6.9.  Pt is s/p left AKA. Hold home metformin and glipizide. Pt is on moderate SSI.  Hepatitis C Infection - HCV viral load 1,610,960.  Normal AST/ALT with chronic hypoalbuminemia. Anti-sHBV negative indicating no prior HBC vaccination.     Jonathon Brace, MD  PGY-II IMTS Pager [phone] 05/15/2014  11:43 AM  History and all data above reviewed.  Patient examined.  I  agree with the findings as above. Patient is now extubated and knows where he is.   The patient exam reveals COR:RRR,  Lungs: Decreased breath sounds  ,  Abd: Positive bowel sounds, no rebound no guarding, Ext No edema   .  All available labs, radiology testing, previous records reviewed. Agree with documented assessment and plan.  Vfib arrest:  Ectopy on telemetry.  He will need to be considered for an ICD now that he is extubated.  We will transition to PO meds for his cardiomyopathy after his swallow study.  Consult EP.    Jonathon Galvan  12:42 PM  05/15/2014

## 2014-05-15 NOTE — Progress Notes (Signed)
PULMONARY / CRITICAL CARE MEDICINE   Name: Jonathon Galvan, Jonathon Galvan    ADMISSION DATE:  05/04/2014  INITIAL PRESENTATION:  [age] M with extensive PMH inc NICM admitted after VF arrest. Hypothermia protocol initiated on admission. 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 LVEF approx 40-45% 11/12 Hypothermia protocol implemented   11/12 CT head: NAICP 11/12 CT neck: No cervical spine fracture or traumatic subluxation. Advanced multilevel cervical spondylosis is noted 11/14 echo>>>30% to 35%. Dyskinesis of the mid-apicalanteroseptal myocardium. Doppler parameters are consistent with abnormal left ventricular relaxation (grade 1 diastolic dysfunction). 11/16 eeg>>>no seizure, generalized slowing, c/w hypoxic encephalopathy  11/17: several nurses holding him down. Very agitated. Still w/ copious secretions from ETT. Started on diprivan, precedex stopped. Also started on clonazepam and Seroquel.  11/23 Passed swallow eval  INDWELLING DEVICES: R radial A-line 11/12 >> 11/Galvan ETT 11/12 >> 11/22 R femoral CVL 11/12 >> 11/15 R femoral arterial sheath 11/12 >> 11/15 Left IJ CVL 11/16>>>  SUBJECTIVE: Passed swallow eval this am.  No acute events overnight.  Delirium much improved.  VITAL SIGNS: Temp:  [98.1 F (36.7 C)-99.2 F (37.3 C)] 98.1 F (36.7 C) (11/23 0800) Pulse Rate:  [66-82] 77 (11/23 1000) Resp:  [14-30] 18 (11/23 0900) BP: (97-166)/(48-86) 97/73 mmHg (11/23 1000) SpO2:  [90 %-99 %] 94 % (11/23 1000) Weight:  [82.6 kg (182 lb 1.6 oz)] 82.6 kg (182 lb 1.6 oz) (11/23 0300)  HEMODYNAMICS:    VENTILATOR SETTINGS:    INTAKE / OUTPUT:  Intake/Output Summary (Last 24 hours) at 05/15/14 1148 Last data filed at 05/15/14 1000  Gross per 24 hour  Intake    815 ml  Output   2650 ml  Net  -1835 ml   PHYSICAL EXAMINATION: General:  WDWN male, resting in bed, in NAD. Neuro: No focal deficits. HEENT: superficial scalp abrasion Cardiovascular: RRR. Lungs: CTA bilaterallkly, no W/R/R. Abdomen: soft, diminished BS. 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  CBC Recent Labs     05/13/14  0400  05/14/14  0415  05/15/14  0500  WBC  12.3*  Galvan.3*  14.8*  HGB  11.3*  12.3*  12.9*  HCT  36.0*  38.2*  40.4  PLT  213  191  279   Coag's No results for input(s): APTT, INR in the last 72 hours.  BMET Recent Labs     05/13/14  0400  05/14/14  0415  05/15/14  0500  NA  149*  135*  149*  K  4.0  3.9  3.6*  CL  106  99  105  CO2  29  27  29   BUN  28*  18  33*  CREATININE  2.03*  1.30  1.80*  GLUCOSE  179*  100*  120*   Electrolytes Recent Labs     05/13/14  0400  05/14/14  0415  05/15/14  0500  CALCIUM  8.8  8.2*  9.2  MG  2.4  1.8  2.7*  PHOS  5.0*  2.9  3.3    Sepsis Markers No results for input(s): PROCALCITON, O2SATVEN in the last 72 hours.  Invalid input(s): LACTICACIDVEN ABG Recent Labs     05/13/14  0500  05/14/14  0456  PHART  7.362  7.410  PCO2ART  54.2*  47.8*  PO2ART  86.1  85.0   Liver Enzymes No results for input(s): AST,  ALT, ALKPHOS, BILITOT, ALBUMIN in the last 72 hours. Cardiac Enzymes No results for input(s): TROPONINI, PROBNP in the last 72 hours.  Glucose Recent Labs     05/14/14  1148  05/14/14  1645  05/14/14  1957  05/14/14  2329  05/15/14  0334  05/15/14  0804  GLUCAP  152*  114*  131*  118*  119*  121*   Imaging Dg Chest Port 1 View  05/14/2014   CLINICAL DATA:  Post cardiac catheterization, intubation, history CHF, coronary artery disease, diabetes, smoking, hypertension  EXAM: PORTABLE CHEST - 1 VIEW  COMPARISON:  Portable exam 0524 hr compared to 05/13/2014  FINDINGS: Tip of endotracheal tube projects 3.6 cm above carina.  Tip of LEFT jugular central venous catheter projects over SVC at the level of the azygos confluence.  Nasogastric tube extends into stomach.   Enlargement of cardiac silhouette with pulmonary vascular congestion.  Improved bibasilar aeration since previous study.  Upper lungs clear.  No pleural effusion or pneumothorax.  IMPRESSION: Improved bibasilar aeration versus previous study.   Electronically Signed   By: Ulyses Southward M.D.   On: 05/14/2014 07:34    ASSESSMENT / PLAN:  PULMONARY A: Acute respiratory failure s/p cardiopulmonary arrest - resolved Aspiration PNA - on Vanc, 8 day course total planned (stop date 11/26) Diuresed very well overnight Tobacco use disorder P:   Supplemental O2 as needed for SpO2 > 92%. Pulmonary hygiene. Continue vanc. Nicotine patch.  CARDIOVASCULAR A:  VF cardiac arrest - resolved, s/p hypothermia protocol Previously documented NICM: EF 30-35%  Chronic LBBB Sinus bradycardia - resolved Ventricular ectopy Cardiogenic shock - resolved  P:  Pending neuro situation may need ICD, cards to decide. Cont coreg and Lipitor. D/c hydralazine. Restart bidil 2 tabs BID (was d/c'd while pt was unable to swallow, has not passed swallow eval). Negative fluid balance goal.  RENAL A:   CRI w/ acute on chronic injury  - scr rising after diuresis  Hypernatremia  Hypokalemia P:   Hold further diureses given renal function. Encourage free water PO. Replace electrolytes as indicated.  GASTROINTESTINAL A:   Passed swallow eval HCV - HCV Ab positive 11/15, new diagnosis Hepatic encephalopathy - improved P:   Regular diet. D/c SUP, tube feeds, lactulose.  HEMATOLOGIC A:   Mild anemia  P:  DVT px: SQ heparin Monitor CBC intermittently Transfuse per usual ICU guidelines  INFECTIOUS A:   Aspiration PNA - on vanc, planned 8 day course P:   Sputum 11/16>>>MRSA BC 11/12>>>NTD Unasyn 11/16>>>11/17 Rocephin 11/17>>>11/22 Vanc 11/19 >>> (stop date 11/26 for 8 days total) D/c PCT algorithm.  ENDOCRINE A:   DM 2 P:   Cont SSI  NEUROLOGIC A:   Post anoxic encephalopathy Over sedation?  - mental status gradually improving after sedating meds held by neuro H/o cocaine, methadone (active?) P:   D/c all sedation. Clonazepam and Seroquel d/c'd by neurology. Continue valproate (for delirium) per neuro. Neuro consulted and following appreciate input  Global:  Stable for transfer out of ICU.  Will transfer to SDU and ask TRH to assume care starting AM of 11/24.   Rutherford Guys, [place] - [facility] [place] [facility] Pgr: [phone]  or [phone] 05/15/2014, 12:12 PM  Note edited in full.  Will transfer to SDU and to [facility], PCCM will sign off, please call back if needed.  Patient seen and examined, agree with above note.  I dictated the care and orders  written for this patient under my direction.  Rush Farmer, MD [phone]

## 2014-05-15 NOTE — Evaluation (Signed)
Clinical/Bedside Swallow Evaluation Patient Details  Name: Jonathon Galvan MRN: [id number] Date of Birth: [birth year]  Today's Date: 05/15/2014 Time: 0951-1021 SLP Time Calculation (min) (ACUTE ONLY): 30 min  Past Medical History:  Past Medical History  Diagnosis Date  . CHF (congestive heart failure)     EF 30-35%, nonischemic  . CAD (coronary artery disease)   . Hyperlipidemia     hypertriglyceridemia on niaspan and lipitor.  . Asthma   . Allergy   . PVD (peripheral vascular disease)     s/p left AKA @ [facility] (MRSA septic arthritis)  . Diabetes mellitus     dx in 2009, DKA, came unresponsive  . Tobacco abuse   . Hepatitis C   . Hypertension   . LBBB (left bundle branch block)   . Anemia of chronic disease     baseline Hgb now 13  . Thrombocytopenia     07/2007, went down to 90,000. likely 2/2 neurontin.  . Cocaine use     s/p cardiopulmonary arrest 12/25/2002 ([facility])  . Cholelithiasis     documented on CT abdomen in 07/2007   Past Surgical History:  Past Surgical History  Procedure Laterality Date  . Left leg mrsa infection s/p aka    . Left shoulder cyst removal     HPI:  68 year old male with extensive cardiac history who presents to the hospital after noticing that he fell out of his wheel chair. After being put back up in the wheel chair was noticed that he was not breathing. EMS was called. Patient was in VF. Shocked with 5 minutes of CPR. Patient was brought to the ED where he arrested again for 1 minutes and once paced patient normalized. Hypothermia protocol initiated on admission. LHC revealed nonobstructive CAD with moderate LV dysfunction. Pt has persistent encephalopathy following cardiac arrest.   Assessment / Plan / Recommendation Clinical Impression   Pt demonstrates swallow function within normal limits. However, he is highly distractible requiring verbal cueing to focus and sustain attention to PO trials. SLP provided education regarding  supervision for attention and increased safety during meals to pt, his daughters, and sister.Recommend regular diet and thin liquids with full supervision for attention to meal.    Aspiration Risk  Mild    Diet Recommendation Regular;Thin liquid   Liquid Administration via: Straw;Cup Medication Administration: Whole meds with liquid Supervision: Staff to assist with self feeding;Full supervision/cueing for compensatory strategies Compensations: Slow rate;Small sips/bites Postural Changes and/or Swallow Maneuvers: Seated upright 90 degrees    Other  Recommendations Oral Care Recommendations: Oral care BID   Follow Up Recommendations  Inpatient Rehab    Frequency and Duration        Pertinent Vitals/Pain none    SLP Swallow Goals     Swallow Study Prior Functional Status       General Date of Onset: 05/04/14 HPI: 68 year old male with extensive cardiac history who presents to the hospital after noticing that he fell out of his wheel chair. After being put back up in the wheel chair was noticed that he was not breathing. EMS was called. Patient was in VF. Shocked with 5 minutes of CPR. Patient was brought to the ED where he arrested again for 1 minutes and once paced patient normalized. Hypothermia protocol initiated on admission. LHC revealed nonobstructive CAD with moderate LV dysfunction. Pt has persistent encephalopathy following cardiac arrest. Type of Study: Bedside swallow evaluation Diet Prior to this Study: NPO Temperature Spikes Noted:  No Respiratory Status: Room air History of Recent Intubation: Yes Length of Intubations (days): 10 days Date extubated: 05/14/14 Behavior/Cognition: Alert;Cooperative;Pleasant mood;Distractible;Requires cueing;Decreased sustained attention Oral Cavity - Dentition: Dentures, bottom;Dentures, top Self-Feeding Abilities: Able to feed self;Needs assist Patient Positioning: Upright in bed Baseline Vocal Quality: Clear Volitional  Cough: Strong    Oral/Motor/Sensory Function Overall Oral Motor/Sensory Function: Appears within functional limits for tasks assessed   Ice Chips     Thin Liquid Thin Liquid: Within functional limits Presentation: Cup;Straw    Nectar Thick     Honey Thick     Puree Puree: Within functional limits Presentation: Spoon   Solid   GO    Solid: Within functional limits       Barkley BrunsO'Brien, Katherine 05/15/2014,10:28 AM

## 2014-05-15 NOTE — Consult Note (Signed)
Reason for Consult: Ventricular fibrillation cardiac arrest  Referring Physician: Dr. Edwena BundeHochrein  Jonathon Galvan is an 68 y.o. male.   HPI: The patient is a 68 yo man with a h/o a non-ischemic CM, chronic systolic heart failure and LBBB. He sustained a VF arrest and has slowly recovered. He does not have CAD. He has no recollection of the events around his arrest. He cannot give much history as he has retrograde amnesia. He has multiple other comorbidities as described below.  PMH: Past Medical History  Diagnosis Date  . CHF (congestive heart failure)     EF 30-35%, nonischemic  . CAD (coronary artery disease)   . Hyperlipidemia     hypertriglyceridemia on niaspan and lipitor.  . Asthma   . Allergy   . PVD (peripheral vascular disease)     s/p left AKA @ [facility] (MRSA septic arthritis)  . Diabetes mellitus     dx in 2009, DKA, came unresponsive  . Tobacco abuse   . Hepatitis C   . Hypertension   . LBBB (left bundle branch block)   . Anemia of chronic disease     baseline Hgb now 13  . Thrombocytopenia     07/2007, went down to 90,000. likely 2/2 neurontin.  . Cocaine use     s/p cardiopulmonary arrest 12/25/2002 ([facility])  . Cholelithiasis     documented on CT abdomen in 07/2007    PSHX: Past Surgical History  Procedure Laterality Date  . Left leg mrsa infection s/p aka    . Left shoulder cyst removal      FAMHX: Family History  Problem Relation Age of Onset  . Coronary artery disease Mother     died in her 8360's  . Cirrhosis Father     died from complications  . Coronary artery disease Brother     died of fatal MI    Social History:  reports that he has been smoking.  He does not have any smokeless tobacco history on file. He reports that he does not drink alcohol or use illicit drugs. He has a h/o Drug use remotely. Allergies: No Known Allergies  Medications: {medi Dg Chest Port 1 View  05/14/2014   CLINICAL DATA:  Post cardiac catheterization,  intubation, history CHF, coronary artery disease, diabetes, smoking, hypertension  EXAM: PORTABLE CHEST - 1 VIEW  COMPARISON:  Portable exam 0524 hr compared to 05/13/2014  FINDINGS: Tip of endotracheal tube projects 3.6 cm above carina.  Tip of LEFT jugular central venous catheter projects over SVC at the level of the azygos confluence.  Nasogastric tube extends into stomach.  Enlargement of cardiac silhouette with pulmonary vascular congestion.  Improved bibasilar aeration since previous study.  Upper lungs clear.  No pleural effusion or pneumothorax.  IMPRESSION: Improved bibasilar aeration versus previous study.   Electronically Signed   By: Ulyses SouthwardMark  Boles M.D.   On: 05/14/2014 07:34    ROS  As stated in the HPI and negative for all other systems.  Physical Exam  Vitals:Blood pressure 120/37, pulse 76, temperature 99.3 F (37.4 C), temperature source Oral, resp. rate 16, height 6' (1.829 m), weight 182 lb 1.6 oz (82.6 kg), SpO2 89 %.  Confused and somnolent, appearing NAD HEENT: Unremarkable Neck:  7 cm JVD, no thyromegally Lymphatics:  No adenopathy Back:  No CVA tenderness Lungs:  Clear with no wheezes HEART:  Regular rate rhythm, no murmurs, no rubs, no clicks Abd:  Flat, positive bowel sounds, no organomegally,  no rebound, no guarding Ext:  2 plus pulses, no edema, no cyanosis, no clubbing Skin:  No rashes no nodules Neuro:  CN II through XII intact, motor grossly intact  ECG - nsr with LBBB  Assessment/Plan: 1. VF cardiac arrest 2. Non-ischemic CM 3. LBBB 4. Residual anoxic encephalopathy Rec: the patient is recovering neuro function but not there yet. He may well be a candidate for BiV ICD. Will see how he progresses. He might be considered for a life vest although if his neuro status continues to improve, would consider going straight to ICD as he has no reversible causes of his VF arrest.   Kristopher Glee 05/15/2014, 6:22 PM

## 2014-05-15 NOTE — Progress Notes (Signed)
Rehab Admissions Coordinator Note:  Patient was screened by Trish Mage for appropriateness for an Inpatient Acute Rehab Consult.  At this time, we are recommending Skilled Nursing Facility.  Given current diagnosis, it is very unlikely that we could get approval for an acute inpatient rehab admission.  Call me for questions.  Trish Mage 05/15/2014, 4:26 PM  I can be reached at [phone].

## 2014-05-15 NOTE — Progress Notes (Signed)
NUTRITION FOLLOW-UP  INTERVENTION: Ensure Complete po BID, each supplement provides 350 kcal and 13 grams of protein  NUTRITION DIAGNOSIS: Inadequate oral intake related to cognition as evidenced by limited intake; ongoing.   Goal: Pt to meet >/= 90% of their estimated nutrition needs; not met.     Monitor: changes in respiratory status, nutrition support labs and weights  ASSESSMENT:  Pt admitted following VF arrest . Hypothermia protocol implemented. Pt extubated 11/22, pt passed swallow eval 11/23 and started on Regular diet.  Visited pt who had PT in the room. Per PT pt would not wake up for long.  Pt at risk for poor intake due to cognition. Will order supplements.   Sodium elevated and potassium low.  Magnesium elevated.   Height: Ht Readings from Last 1 Encounters:  05/04/14 6' (1.829 m)    Weight: Wt Readings from Last 1 Encounters:  05/15/14 182 lb 1.6 oz (82.6 kg)  Admission weight 192 lb (87.5 kg) 11/12 Adjusted IBW: 74.4 kg  BMI:  28.3 - overweight  Estimated Nutritional Needs: Kcal: 1700-1900 Protein: 85-100 grams Fluid: >/= 1.7 liters daily  Skin:  Head abrasion Left lower leg amputation (right AKA) Ecchymosis on head and abdomen Skin tear left hand  Diet Order: Diet regular   Intake/Output Summary (Last 24 hours) at 05/15/14 1552 Last data filed at 05/15/14 1500  Gross per 24 hour  Intake    980 ml  Output   2725 ml  Net  -1745 ml    Last BM:  11/22  Labs:   Recent Labs Lab 05/13/14 0400 05/14/14 0415 05/15/14 0500  NA 149* 135* 149*  K 4.0 3.9 3.6*  CL 106 99 105  CO2 29 27 29   BUN 28* 18 33*  CREATININE 2.03* 1.30 1.80*  CALCIUM 8.8 8.2* 9.2  MG 2.4 1.8 2.7*  PHOS 5.0* 2.9 3.3  GLUCOSE 179* 100* 120*    CBG (last 3)   Recent Labs  05/15/14 0334 05/15/14 0804 05/15/14 1231  GLUCAP 119* 121* 171*    Scheduled Meds: . alteplase  2 mg Intracatheter Once  . atorvastatin  10 mg Oral q1800  . carvedilol  12.5 mg Oral  BID WC  . heparin subcutaneous  5,000 Units Subcutaneous 3 times per day  . insulin aspart  0-15 Units Subcutaneous 6 times per day  . isosorbide-hydrALAZINE  2 tablet Oral BID  . nicotine  7 mg Transdermal Daily  . valproate sodium  500 mg Intravenous Q12H  . vancomycin  500 mg Intravenous Q12H    Continuous Infusions:   Jonathon Galvan RD, LDN, CNSC [phone] Pager [phone] After Hours Pager

## 2014-05-16 DIAGNOSIS — J189 Pneumonia, unspecified organism: Secondary | ICD-10-CM | POA: Diagnosis present

## 2014-05-16 DIAGNOSIS — J15212 Pneumonia due to Methicillin resistant Staphylococcus aureus: Secondary | ICD-10-CM | POA: Diagnosis present

## 2014-05-16 DIAGNOSIS — I4901 Ventricular fibrillation: Secondary | ICD-10-CM | POA: Diagnosis present

## 2014-05-16 DIAGNOSIS — R410 Disorientation, unspecified: Secondary | ICD-10-CM | POA: Diagnosis not present

## 2014-05-16 DIAGNOSIS — J9601 Acute respiratory failure with hypoxia: Secondary | ICD-10-CM | POA: Diagnosis present

## 2014-05-16 DIAGNOSIS — B182 Chronic viral hepatitis C: Secondary | ICD-10-CM | POA: Diagnosis present

## 2014-05-16 LAB — CBC
HCT: 36.2 % — ABNORMAL LOW (ref 39.0–52.0)
HEMOGLOBIN: 11.5 g/dL — AB (ref 13.0–17.0)
MCH: 26.1 pg (ref 26.0–34.0)
MCHC: 31.8 g/dL (ref 30.0–36.0)
MCV: 82.3 fL (ref 78.0–100.0)
PLATELETS: 280 10*3/uL (ref 150–400)
RBC: 4.4 MIL/uL (ref 4.22–5.81)
RDW: 15.5 % (ref 11.5–15.5)
WBC: 14.7 10*3/uL — AB (ref 4.0–10.5)

## 2014-05-16 LAB — GLUCOSE, CAPILLARY
GLUCOSE-CAPILLARY: 171 mg/dL — AB (ref 70–99)
GLUCOSE-CAPILLARY: 181 mg/dL — AB (ref 70–99)
Glucose-Capillary: 119 mg/dL — ABNORMAL HIGH (ref 70–99)
Glucose-Capillary: 123 mg/dL — ABNORMAL HIGH (ref 70–99)
Glucose-Capillary: 130 mg/dL — ABNORMAL HIGH (ref 70–99)
Glucose-Capillary: 159 mg/dL — ABNORMAL HIGH (ref 70–99)

## 2014-05-16 LAB — BASIC METABOLIC PANEL
Anion gap: 12 (ref 5–15)
BUN: 35 mg/dL — ABNORMAL HIGH (ref 6–23)
CHLORIDE: 107 meq/L (ref 96–112)
CO2: 28 mEq/L (ref 19–32)
Calcium: 8.8 mg/dL (ref 8.4–10.5)
Creatinine, Ser: 2.01 mg/dL — ABNORMAL HIGH (ref 0.50–1.35)
GFR calc Af Amer: 37 mL/min — ABNORMAL LOW (ref >90)
GFR, EST NON AFRICAN AMERICAN: 32 mL/min — AB (ref >90)
GLUCOSE: 121 mg/dL — AB (ref 70–99)
Potassium: 4.1 mEq/L (ref 3.7–5.3)
SODIUM: 147 meq/L (ref 137–147)

## 2014-05-16 LAB — MAGNESIUM: MAGNESIUM: 2.9 mg/dL — AB (ref 1.5–2.5)

## 2014-05-16 LAB — HEMOGLOBIN A1C
Hgb A1c MFr Bld: 6.5 % — ABNORMAL HIGH (ref <5.7)
Mean Plasma Glucose: 140 mg/dL — ABNORMAL HIGH (ref <117)

## 2014-05-16 MED ORDER — INSULIN ASPART 100 UNIT/ML ~~LOC~~ SOLN
0.0000 [IU] | Freq: Three times a day (TID) | SUBCUTANEOUS | Status: DC
Start: 2014-05-16 — End: 2014-05-21
  Administered 2014-05-16: 1 [IU] via SUBCUTANEOUS
  Administered 2014-05-16: 2 [IU] via SUBCUTANEOUS
  Administered 2014-05-16 – 2014-05-18 (×3): 1 [IU] via SUBCUTANEOUS
  Administered 2014-05-18 – 2014-05-20 (×2): 2 [IU] via SUBCUTANEOUS
  Administered 2014-05-20: 3 [IU] via SUBCUTANEOUS
  Administered 2014-05-21: 1 [IU] via SUBCUTANEOUS

## 2014-05-16 MED ORDER — INSULIN ASPART 100 UNIT/ML ~~LOC~~ SOLN: 0.0000 [IU] | Freq: Every day | SUBCUTANEOUS | Status: DC

## 2014-05-16 MED ORDER — DIVALPROEX SODIUM 500 MG PO DR TAB
500.0000 mg | DELAYED_RELEASE_TABLET | Freq: Two times a day (BID) | ORAL | Status: DC
  Administered 2014-05-16 – 2014-05-18 (×6): 500 mg via ORAL
  Filled 2014-05-16 (×7): qty 1

## 2014-05-16 MED ORDER — GLIPIZIDE 10 MG PO TABS
10.0000 mg | ORAL_TABLET | Freq: Every day | ORAL | Status: DC
  Administered 2014-05-16 – 2014-05-21 (×5): 10 mg via ORAL
  Filled 2014-05-16 (×6): qty 1

## 2014-05-16 NOTE — Clinical Social Work Placement (Addendum)
Clinical Social Work Department CLINICAL SOCIAL WORK PLACEMENT NOTE 05/16/2014  Patient:  Jonathon Galvan, Jonathon Galvan  Account Number:  [id number] Admit date:  05/04/2014  Clinical Social Worker:  Robin Searing  Date/time:  05/16/2014 11:29 AM  Clinical Social Work is seeking post-discharge placement for this patient at the following level of care:   SKILLED NURSING   (*CSW will update this form in Epic as items are completed)   05/16/2014  Patient/family provided with [facility] Department of Clinical Social Work's list of facilities offering this level of care within the geographic area requested by the patient (or if unable, by the patient's family).  05/16/2014  Patient/family informed of their freedom to choose among providers that offer the needed level of care, that participate in Medicare, Medicaid or managed care program needed by the patient, have an available bed and are willing to accept the patient.  05/16/2014  Patient/family informed of [facility]' ownership interest in [facility], as well as of the fact that they are under no obligation to receive care at this facility.  PASARR submitted to EDS on 05/15/2014 PASARR number received on 05/15/2014  FL2 transmitted to all facilities in geographic area requested by pt/family on  05/15/2014 FL2 transmitted to all facilities within larger geographic area on   Patient informed that his/her managed care company has contracts with or will negotiate with  certain facilities, including the following:     Patient/family informed of bed offers received:  05/17/2014 Patient chooses bed at  [facility] (05/19/2014) Physician recommends and patient chooses bed at    Patient to be transferred to [facility] on  05/21/2014 Patient to be transferred to facility by [facility], LCSWA Patient and family notified of transfer on 05/21/2014 Name of family member notified:  Sister present at  bedside  The following physician request were entered in Epic:   Additional Comments: Reece Levy, MSW, [employer]

## 2014-05-16 NOTE — Evaluation (Signed)
Occupational Therapy Evaluation Patient Details Name: Jonathon Galvan MRN: [id number] DOB: Apr 04, 1946 Today's Date: 05/16/2014    History of Present Illness Pt adm with cardiac arrest and hypothermia protocol initiated. Pt extubated 11/22. PMH - lt AKA, CHF, CAD, Asthma; Allergy; PVD (peripheral vascular disease); Diabetes mellitus; Tobacco abuse; Hepatitis C; Hypertension; LBBB (left bundle branch block); Anemia, Cocaine use   Clinical Impression   Per chart, pt lived alone at a modified independent level prior to admission. Presents with cognitive deficits, poor balance and incoordination interfering with ability to perform ADL and ADL transfers.  Pt requires +2 assist for OOB and max to total assist for all ADL.  Will follow acutely.    Follow Up Recommendations  SNF;Supervision/Assistance - 24 hour    Equipment Recommendations       Recommendations for Other Services       Precautions / Restrictions Precautions Precautions: Fall Restrictions Weight Bearing Restrictions: No LLE Weight Bearing: Non weight bearing      Mobility Bed Mobility               General bed mobility comments: Not assessed, pt up in chair.  Transfers Overall transfer level: Needs assistance   Transfers: Stand Pivot Transfers   Stand pivot transfers: +2 physical assistance;Max assist            Balance Overall balance assessment: Needs assistance Sitting-balance support: Feet supported Sitting balance-Leahy Scale: Poor Sitting balance - Comments: repeatedly losing balance to sides and forward                                    ADL Overall ADL's : Needs assistance/impaired Eating/Feeding: Maximal assistance;Sitting Eating/Feeding Details (indicate cue type and reason): increased spillage, release of eating utensil, poor coordination Grooming: Wash/dry hands;Wash/dry face;Maximal assistance;Sitting   Upper Body Bathing: Total assistance;Sitting   Lower Body  Bathing: Total assistance;Bed level   Upper Body Dressing : Maximal assistance;Sitting   Lower Body Dressing: Total assistance;Bed level               Functional mobility during ADLs: +2 for physical assistance;Maximal assistance       Vision                     Perception     Praxis      Pertinent Vitals/Pain Pain Assessment: No/denies pain     Hand Dominance Right   Extremity/Trunk Assessment Upper Extremity Assessment Upper Extremity Assessment: RUE deficits/detail;LUE deficits/detail RUE Deficits / Details: generalized weakness, incoordination/ataxia RUE Coordination: decreased fine motor;decreased gross motor LUE Deficits / Details: generalized weakness, incoordination/ataxia LUE Coordination: decreased fine motor;decreased gross motor   Lower Extremity Assessment Lower Extremity Assessment: Defer to PT evaluation       Communication Communication Communication: No difficulties   Cognition Arousal/Alertness: Awake/alert Behavior During Therapy: WFL for tasks assessed/performed Overall Cognitive Status: Impaired/Different from baseline Area of Impairment: Orientation;Attention;Memory;Following commands;Safety/judgement;Awareness;Problem solving Orientation Level: Disoriented to;Situation Current Attention Level: Focused Memory: Decreased recall of precautions;Decreased short-term memory Following Commands: Follows one step commands with increased time Safety/Judgement: Decreased awareness of safety;Decreased awareness of deficits   Problem Solving: Slow processing;Requires verbal cues;Requires tactile cues;Decreased initiation     General Comments       Exercises       Shoulder Instructions      Home Living Family/patient expects to be discharged to:: Skilled nursing facility  Home Equipment: Gilmer MorCane - single point;Wheelchair - manual          Prior Functioning/Environment Level of  Independence: Independent with assistive device(s)        Comments: Amb with cane and prosthesis.    OT Diagnosis: Generalized weakness;Cognitive deficits   OT Problem List: Decreased strength;Decreased activity tolerance;Impaired balance (sitting and/or standing);Decreased coordination;Decreased cognition;Decreased safety awareness;Decreased knowledge of use of DME or AE;Impaired UE functional use   OT Treatment/Interventions: Self-care/ADL training;DME and/or AE instruction;Therapeutic activities;Cognitive remediation/compensation;Patient/family education;Balance training    OT Goals(Current goals can be found in the care plan section) Acute Rehab OT Goals Patient Stated Goal: Pt didn't state OT Goal Formulation: Patient unable to participate in goal setting Time For Goal Achievement: 05/30/14 Potential to Achieve Goals: Good ADL Goals Pt Will Perform Eating: with min assist;sitting;with adaptive utensils (adaptive cup) Pt Will Perform Grooming: with min assist;sitting Pt Will Transfer to Toilet: with +2 assist;with min assist;bedside commode;stand pivot transfer Additional ADL Goal #1: Pt will attend to ADL task x 2 minutes with minimal verbal cues.  OT Frequency: Min 2X/week   Barriers to D/C:            Co-evaluation              End of Session Nurse Communication: Mobility status  Activity Tolerance: Patient limited by fatigue Patient left: in chair;with call bell/phone within reach;with chair alarm set   Time: [id number] OT Time Calculation (min): 41 min Charges:  OT General Charges $OT Visit: 1 Procedure OT Evaluation $Initial OT Evaluation Tier I: 1 Procedure OT Treatments $Self Care/Home Management : 23-37 mins G-Codes:    Evern BioMayberry, Ocia Simek Lynn 05/16/2014, 11:48 AM  [phone]

## 2014-05-16 NOTE — Progress Notes (Signed)
Physical Therapy Treatment Patient Details Name: Jonathon Galvan MRN: [id number] DOB: [birth year] Today's Date: 05/16/2014    History of Present Illness Pt adm with cardiac arrest and hypothermia protocol initiated. Pt extubated 11/22. PMH - lt AKA, CHF, CAD, Asthma; Allergy; PVD (peripheral vascular disease); Diabetes mellitus; Tobacco abuse; Hepatitis C; Hypertension; LBBB (left bundle branch block); Anemia, Cocaine use    PT Comments    Pt making good progress.  Follow Up Recommendations  SNF     Equipment Recommendations  None recommended by PT    Recommendations for Other Services       Precautions / Restrictions Precautions Precautions: Fall    Mobility  Bed Mobility Overal bed mobility: Needs Assistance Bed Mobility: Supine to Sit;Sit to Supine     Supine to sit: Mod assist;HOB elevated Sit to supine: Min assist   General bed mobility comments: Verbal/tactile cues to initially arouse and assist to bring leg over and elevate trunk.  Transfers Overall transfer level: Needs assistance Equipment used: Ambulation equipment used Charlaine Dalton) Transfers: Sit to/from Stand Sit to Stand: +2 physical assistance;Min assist         General transfer comment: Stood from bed with Stedy with assist for balance and to bring hips up. Verbal/tactile cues for technique.  Ambulation/Gait                 Stairs            Wheelchair Mobility    Modified Rankin (Stroke Patients Only)       Balance   Sitting-balance support: Bilateral upper extremity supported Sitting balance-Leahy Scale: Poor Sitting balance - Comments: Pt sat EOB x 20 minutes with mod to min guard assist. Pt able to correct balance ~75% of the time.   Galvan balance support: Bilateral upper extremity supported Galvan balance-Leahy Scale: Poor Galvan balance comment: Stood with Stedy x 3 for ~60-90 sec with min A to maintain.                    Cognition Arousal/Alertness:  Awake/alert Behavior During Therapy: WFL for tasks assessed/performed Overall Cognitive Status: Impaired/Different from baseline Area of Impairment: Orientation;Attention;Memory;Following commands;Safety/judgement;Awareness;Problem solving Orientation Level: Disoriented to;Situation Current Attention Level: Focused Memory: Decreased recall of precautions;Decreased short-term memory Following Commands: Follows one step commands with increased time Safety/Judgement: Decreased awareness of safety;Decreased awareness of deficits   Problem Solving: Slow processing;Requires verbal cues;Requires tactile cues;Decreased initiation      Exercises      General Comments        Pertinent Vitals/Pain Pain Assessment: No/denies pain    Home Living                      Prior Function            PT Goals (current goals can now be found in the care plan section) Progress towards PT goals: Progressing toward goals;Goals met and updated - see care plan    Frequency  Min 2X/week    PT Plan Discharge plan needs to be updated;Frequency needs to be updated    Co-evaluation             End of Session Equipment Utilized During Treatment: Gait belt Charlaine Dalton) Activity Tolerance: Patient tolerated treatment well Patient left: in bed;with call bell/phone within reach;with bed alarm set     Time: 5366-4403 PT Time Calculation (min) (ACUTE ONLY): 38 min  Charges:  $Gait Training: 23-37 mins $Therapeutic Activity: 8-22 mins  G Codes:      Teshara Moree 05/16/2014, 4:51 PM  [facility] PT [phone]

## 2014-05-16 NOTE — Progress Notes (Signed)
[facility]  Juanda BondWilliam D Villaflor MWU:[id number] DOB: Oct 15, 1945 DOA: 05/04/2014 PCP: Quitman LivingsHASSAN,SAMI, MD   Brief narrative: 68 year old male patient with extensive past medical history including nonischemic cardiomyopathy. He was admitted to the hospital after undergoing a ventricular fibrillation arrest. Prior to onset of the wrist symptoms the patient had been sitting in his wheelchair when he abruptly fell out of the wheelchair. While being placed back in the wheelchair staff noticed he was not breathing therefore EMS was called.  Upon arrival of EMS the patient was noted to be in ventricular fibrillation he underwent defibrillation and 5 minutes of CPR. After presenting to the emergency department he arrested again for another 60 seconds; emergent pacing begun and patient stabilized.  He was admitted to the ICU where he remained intubated for 10 days. It was suspected patient had associated aspiration pneumonitis in the setting of cardiac arrest. Because of presenting with ventricular fibrillation hypothermia protocol was implemented. Cardiology was consulted. 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 All of echocardiogram on 1114 revealed an EF of 30-35%.  On 11/17 patient developed extreme agitation consistent with acute delirium. Neurology was consulted. CT of the head as well as MRI of the brain were unrevealing. IV Depakote was initiated to treat acute delirium.  He was eventually extubated and transferred to the stepdown unit. He was evaluated by CIR who recommended patient be discharged to skilled nursing facility. EP Cardiology has documented that he may be a possible candidate for defibrillator is ominous as long as his neurological status continues to improve.  MAJOR EVENTS/TEST RESULTS: 11/12 Admission via ED after OOH VF arrest  11/12 LHC: Nonobstructive CAD. LVEF approx 40-45% 11/12  Hypothermia protocol implemented  11/12 CT head: NAICP 11/12 CT neck: No cervical spine fracture or traumatic subluxation. Advanced multilevel cervical spondylosis is noted 11/14 echo>>>30% to 35%. Dyskinesis of the mid-apicalanteroseptal myocardium. Doppler parameters are consistent with abnormal left ventricular relaxation (grade 1 diastolic dysfunction). 11/16 eeg>>>no seizure, generalized slowing, c/w hypoxic encephalopathy  11/17: several nurses holding him down. Very agitated. Still w/ copious secretions from ETT. Started on diprivan, precedex stopped. Also started on clonazepam and Seroquel.  11/23 Passed swallow eval  HPI/Subjective: Alert and sitting up in the chair. Although mildly confused appears to be at his baseline. States he is "hungry as hell"  Assessment/Plan: Active Problems:   Cardiac arrest/VF (ventricular fibrillation)/Nonischemic cardiomyopathy -EP and Cards following -possible candidate for ICD vs Life vest bridge at dc -cont BiDil -no evidence of CHF decompensation at this time    Acute delirium -Neuro following -was started on Keppra for acute delirium; sx's resolving so ? Taper and dc -transition to PO dosing    Essential hypertension -cont BiDil      Acute respiratory failure with hypoxia/MRSA positive post Aspiration pneumonia ? HCAP -stable on Amanda oxygen -cont Vancomycin until 11/26    Chronic hepatitis C    DM2 (diabetes mellitus, type 2) -CBGs increasing -resume Glucotrol -cont SSI    Peripheral vascular disease/Hx of AKA  -dc to SNF   DVT prophylaxis: Subcutaneous heparin Code Status: Full Family Communication: No family at bedside Disposition Plan/Expected LOS: Transfer to telemetry   Consultants: Cardiology/ Dr. Antoine PocheHochrein EP/Dr. Ladona Ridgelaylor PCCM/Dr. Molli KnockYacoub Neurology/Dr. Amada JupiterKirkpatrick   Cultures: Tracheal aspirate MRSA positive C Diff PCR negative Blood cx's x 2 negative MRSA PCR at admission was  negative  Antibiotics:   Objective: Blood pressure 121/56, pulse 64,  temperature 98.6 F (37 C), temperature source Oral, resp. rate 19, height 6' (1.829 m), weight 187 lb 9.6 oz (85.095 kg), SpO2 96 %.  Intake/Output Summary (Last 24 hours) at 05/16/14 1422 Last data filed at 05/16/14 0900  Gross per 24 hour  Intake 985.17 ml  Output    675 ml  Net 310.17 ml     Exam: Gen: No acute respiratory distress Chest: Coarse to auscultation bilaterally without wheezes, rhonchi or crackles, 1L Cardiac: Regular rate and rhythm, S1-S2, no rubs murmurs or gallops, no peripheral edema, no JVD Abdomen: Soft nontender nondistended without obvious hepatosplenomegaly, no ascites Extremities: Symmetrical in appearance (except for left AKA) without cyanosis, clubbing or effusion   Scheduled Meds:  Scheduled Meds: . alteplase  2 mg Intracatheter Once  . atorvastatin  10 mg Oral q1800  . carvedilol  12.5 mg Oral BID WC  . divalproex  500 mg Oral Q12H  . feeding supplement (ENSURE COMPLETE)  237 mL Oral BID BM  . glipiZIDE  10 mg Oral QAC breakfast  . heparin subcutaneous  5,000 Units Subcutaneous 3 times per day  . insulin aspart  0-5 Units Subcutaneous QHS  . insulin aspart  0-9 Units Subcutaneous TID WC  . isosorbide-hydrALAZINE  2 tablet Oral BID  . nicotine  7 mg Transdermal Daily  . vancomycin  500 mg Intravenous Q12H   Continuous Infusions:   Data Reviewed: Basic Metabolic Panel:  Recent Labs Lab 05/11/14 0350 05/12/14 0430 05/13/14 0400 05/14/14 0415 05/15/14 0500 05/16/14 0700  NA 153* 152* 149* 135* 149* 147  K 4.3 3.9 4.0 3.9 3.6* 4.1  CL 118* 111 106 99 105 107  CO2 24 28 29 27 29 28   GLUCOSE 151* 174* 179* 100* 120* 121*  BUN 19 22 28* 18 33* 35*  CREATININE 1.68* 1.96* 2.03* 1.30 1.80* 2.01*  CALCIUM 8.1* 8.9 8.8 8.2* 9.2 8.8  MG 2.3 2.5 2.4 1.8 2.7* 2.9*  PHOS 3.7 5.3* 5.0* 2.9 3.3  --    Liver Function Tests:  Recent Labs Lab 05/10/14 0335  AST 34  ALT  20  ALKPHOS 52  BILITOT 0.3  PROT 5.8*  ALBUMIN 2.2*   No results for input(s): LIPASE, AMYLASE in the last 168 hours.  Recent Labs Lab 05/13/14 0400  AMMONIA 32   CBC:  Recent Labs Lab 05/12/14 0430 05/13/14 0400 05/14/14 0415 05/15/14 0500 05/16/14 0700  WBC 11.4* 12.3* 13.3* 14.8* 14.7*  HGB 11.4* 11.3* 12.3* 12.9* 11.5*  HCT 36.6* 36.0* 38.2* 40.4 36.2*  MCV 83.2 83.3 83.6 84.0 82.3  PLT 220 213 191 279 280   Cardiac Enzymes: No results for input(s): CKTOTAL, CKMB, CKMBINDEX, TROPONINI in the last 168 hours. BNP (last 3 results) No results for input(s): PROBNP in the last 8760 hours. CBG:  Recent Labs Lab 05/15/14 2026 05/16/14 0010 05/16/14 0419 05/16/14 0807 05/16/14 1139  GLUCAP 163* 159* 119* 123* 171*    Recent Results (from the past 240 hour(s))  Culture, respiratory (NON-Expectorated)     Status: None   Collection Time: 05/07/14  4:40 PM  Result Value Ref Range Status   Specimen Description TRACHEAL ASPIRATE  Final   Special Requests NONE  Final   Gram Stain   Final    ABUNDANT WBC PRESENT,BOTH PMN AND MONONUCLEAR RARE SQUAMOUS EPITHELIAL CELLS PRESENT MODERATE GRAM NEGATIVE COCCOBACILLI FEW GRAM POSITIVE COCCI IN PAIRS RARE GRAM NEGATIVE COCCI    Culture   Final    MODERATE METHICILLIN RESISTANT STAPHYLOCOCCUS AUREUS Note:  RIFAMPIN AND GENTAMICIN SHOULD NOT BE USED AS SINGLE DRUGS FOR TREATMENT OF STAPH INFECTIONS. This organism DOES NOT demonstrate inducible Clindamycin resistance in vitro. CRITICAL RESULT CALLED TO, READ BACK BY AND VERIFIED WITH: SARA@7 :22AM ON  05/11/14 BY DANTS Performed at [facility]    Report Status 05/12/2014 FINAL  Final   Organism ID, Bacteria METHICILLIN RESISTANT STAPHYLOCOCCUS AUREUS  Final      Susceptibility   Methicillin resistant staphylococcus aureus - MIC*    CLINDAMYCIN <=0.25 SENSITIVE Sensitive     ERYTHROMYCIN >=8 RESISTANT Resistant     GENTAMICIN <=0.5 SENSITIVE Sensitive      LEVOFLOXACIN 0.25 SENSITIVE Sensitive     OXACILLIN >=4 RESISTANT Resistant     PENICILLIN >=0.5 RESISTANT Resistant     RIFAMPIN <=0.5 SENSITIVE Sensitive     TRIMETH/SULFA <=10 SENSITIVE Sensitive     VANCOMYCIN 1 SENSITIVE Sensitive     TETRACYCLINE <=1 SENSITIVE Sensitive     * MODERATE METHICILLIN RESISTANT STAPHYLOCOCCUS AUREUS  Clostridium Difficile by PCR     Status: None   Collection Time: 05/10/14  1:13 PM  Result Value Ref Range Status   C difficile by pcr NEGATIVE NEGATIVE Final     Studies:  Recent x-ray studies have been reviewed in detail by the Attending Physician  Time spent :      Junious Silk, ANP [facility] Office  [phone] Pager [phone]   **If unable to reach the above provider after paging please contact the Flow Manager @ [phone]  On-Call/Text Page:      [email or web address]      password [email or web address]  If 7PM-7AM, please contact night-coverage [email or web address] Password [email or web address] 05/16/2014, 2:22 PM   LOS: 12 days   Examined patient and discussed assessment and plan with ANP Revonda Standard and agree with above. Patient with multiple complex medical issues> 40 minutes spent in direct patient care

## 2014-05-16 NOTE — Progress Notes (Signed)
ANTIBIOTIC CONSULT NOTE - FOLLOW UP  Pharmacy Consult for vancomycin Indication: pneumonia  Labs:  Recent Labs  05/14/14 0415 05/15/14 0500 05/16/14 0700  WBC 13.3* 14.8* 14.7*  HGB 12.3* 12.9* 11.5*  PLT 191 279 280  CREATININE 1.30 1.80* 2.01*   Estimated Creatinine Clearance: 37.5 mL/min (by C-G formula based on Cr of 2.01).  Recent Labs  05/13/14 2115  VANCOTROUGH 28.8*     Microbiology: Recent Results (from the past 720 hour(s))  Blood culture (routine x 2)     Status: None   Collection Time: 05/04/14  5:35 PM  Result Value Ref Range Status   Specimen Description BLOOD  Final   Special Requests   Final    BOTTLES DRAWN AEROBIC AND ANAEROBIC 5CC RT SUBCLAVIAN   Culture  Setup Time   Final    05/05/2014 01:30 Performed at [facility]    Culture   Final    NO GROWTH 5 DAYS Performed at [facility]    Report Status 05/11/2014 FINAL  Final  MRSA PCR Screening     Status: None   Collection Time: 05/04/14  7:39 PM  Result Value Ref Range Status   MRSA by PCR NEGATIVE NEGATIVE Final    Comment:        The GeneXpert MRSA Assay (FDA approved for NASAL specimens only), is one component of a comprehensive MRSA colonization surveillance program. It is not intended to diagnose MRSA infection nor to guide or monitor treatment for MRSA infections.   Blood culture (routine x 2)     Status: None   Collection Time: 05/04/14  9:00 PM  Result Value Ref Range Status   Specimen Description BLOOD LEFT ARM  Final   Special Requests BOTTLES DRAWN AEROBIC ONLY 2CC  Final   Culture  Setup Time   Final    05/05/2014 04:08 Performed at [facility]    Culture   Final    NO GROWTH 5 DAYS Performed at [facility]    Report Status 05/11/2014 FINAL  Final  Culture, respiratory (NON-Expectorated)     Status: None   Collection Time: 05/07/14  4:40 PM  Result Value Ref Range Status   Specimen Description TRACHEAL ASPIRATE  Final   Special  Requests NONE  Final   Gram Stain   Final    ABUNDANT WBC PRESENT,BOTH PMN AND MONONUCLEAR RARE SQUAMOUS EPITHELIAL CELLS PRESENT MODERATE GRAM NEGATIVE COCCOBACILLI FEW GRAM POSITIVE COCCI IN PAIRS RARE GRAM NEGATIVE COCCI    Culture   Final    MODERATE METHICILLIN RESISTANT STAPHYLOCOCCUS AUREUS Note: RIFAMPIN AND GENTAMICIN SHOULD NOT BE USED AS SINGLE DRUGS FOR TREATMENT OF STAPH INFECTIONS. This organism DOES NOT demonstrate inducible Clindamycin resistance in vitro. CRITICAL RESULT CALLED TO, READ BACK BY AND VERIFIED WITH: SARA@7 :22AM ON  05/11/14 BY DANTS Performed at [facility]    Report Status 05/12/2014 FINAL  Final   Organism ID, Bacteria METHICILLIN RESISTANT STAPHYLOCOCCUS AUREUS  Final      Susceptibility   Methicillin resistant staphylococcus aureus - MIC*    CLINDAMYCIN <=0.25 SENSITIVE Sensitive     ERYTHROMYCIN >=8 RESISTANT Resistant     GENTAMICIN <=0.5 SENSITIVE Sensitive     LEVOFLOXACIN 0.25 SENSITIVE Sensitive     OXACILLIN >=4 RESISTANT Resistant     PENICILLIN >=0.5 RESISTANT Resistant     RIFAMPIN <=0.5 SENSITIVE Sensitive     TRIMETH/SULFA <=10 SENSITIVE Sensitive     VANCOMYCIN 1 SENSITIVE Sensitive     TETRACYCLINE <=  1 SENSITIVE Sensitive     * MODERATE METHICILLIN RESISTANT STAPHYLOCOCCUS AUREUS  Clostridium Difficile by PCR     Status: None   Collection Time: 05/10/14  1:13 PM  Result Value Ref Range Status   C difficile by pcr NEGATIVE NEGATIVE Final     Assessment: 68 YOM continues on D#6 of Vancomycin for MRSA pneumonia. WBC remains elevated at 14.7 but patient is currently afebrile. Renal function has been fluctuating for the past few days. CrCl currently ~ 35-40 mL/min.   11/15 Hep C >> Reactive 11/12 Bcx x 2 - neg 11/15 TA - MRSA, MIC to vanc = 1  Unasyn 11/15>> 11/17 CTX 11/17>>11/22 Vanc 11/19 >> (11/27) 11/21: VT 28.8 on 750 q12h (dose held). Chg to 500 Q12 for calc'd VT ~19.   Goal of Therapy:  Vancomycin trough  level 15-20 mcg/ml  Plan:  -Continue vancomycin 500mg  IV Q12H for calculated trough of ~19 and continue to monitor. -Vanc trough at 0430 tomorrow   Vinnie LevelBenjamin Caspian Deleonardis, PharmD.  Clinical Pharmacist Pager [phone]

## 2014-05-16 NOTE — Clinical Social Work Psychosocial (Signed)
Clinical Social Work Department BRIEF PSYCHOSOCIAL ASSESSMENT 05/16/2014  Patient:  Jonathon Galvan, Jonathon Galvan     Account Number:  [id number]     [place] date:  05/04/2014  Clinical Social Worker:  Daiva Huge  Date/Time:  05/16/2014 11:14 AM  Referred by:  Physician  Date Referred:  05/15/2014  Other Referral:   Interview type:  Patient Other interview type:    PSYCHOSOCIAL DATA Living Status:  ALONE Admitted from facility:   Level of care:   Primary support name:  [place] Primary support relationship to patient:  FAMILY Degree of support available:   GOOD BUT MINIMAL PHYSICAL SUPPORT    CURRENT CONCERNS Current Concerns  Post-Acute Placement   Other Concerns:    SOCIAL WORK ASSESSMENT / PLAN Met with pateint who reports living at [facility] (senior apartments) for about 2 years. His sister also lives there and is helpful in assisting him to pay bills, get groceries, etc.  CSW discussed possible SNF with him and he is agreeable to this. Patient also spoke openly with CSW about his situation; stating, "this is unfair". He seems to be struggling some with memory- CSW reassured him and will continue to suport and update patient regarding SNF options.   Assessment/plan status:  Other - See comment Other assessment/ plan:   FL2 and [place] completion   Information/referral to community resources:   SNF list    PATIENT'S/FAMILY'S RESPONSE TO PLAN OF CARE: Patient asked that CSW contact his sister, Jonathon Galvan, to update on SNF options as well- CSW spoke with her via phone and she is also in agreement with SNF search- she agrees to assist with SNF selection and paperwork as needed at d/c.       Jonathon Galvan, MSW, Latanya Presser

## 2014-05-16 NOTE — Progress Notes (Signed)
SUBJECTIVE:  Pt seen and examined in AM.  His telemetry reveals NSR/chronic BBB. His blood pressure and heart rate remain stable. He passed swallow evaluation yesterday and now taking PO medications. He was seen by EP yesterday and is possible candidate for ICD if his neurologic status continue to improve.  OBJECTIVE:   Vitals:   Filed Vitals:   05/16/14 0445 05/16/14 0500 05/16/14 0515 05/16/14 0530  BP:      Pulse:      Temp:      TempSrc:      Resp:      Height:      Weight:      SpO2: 89% 89% 90% 93%   I&O's:    Intake/Output Summary (Last 24 hours) at 05/16/14 1096 Last data filed at 05/16/14 0600  Gross per 24 hour  Intake 1310.17 ml  Output   1425 ml  Net -114.83 ml   TELEMETRY: Reviewed telemetry pt in BBB and SB:     PHYSICAL EXAM General: Resting comfortably in bed Head:   Large contusion on right side of head.    Lungs:  Decreased breath sound at bases. Heart:  HRRR S1 S2  No JVD.   Abdomen: Abdomen soft and non-tender Msk:  Left AKA   Extremities:  SCD on right LE. No edema of RLE. Neuro: Alert & orientated x 3  Psych:  Normal affect Skin: No rash   LABS: Basic Metabolic Panel:  Recent Labs  04/54/09 0415 05/15/14 0500  NA 135* 149*  K 3.9 3.6*  CL 99 105  CO2 27 29  GLUCOSE 100* 120*  BUN 18 33*  CREATININE 1.30 1.80*  CALCIUM 8.2* 9.2  MG 1.8 2.7*  PHOS 2.9 3.3   Liver Function Tests: No results for input(s): AST, ALT, ALKPHOS, BILITOT, PROT, ALBUMIN in the last 72 hours. No results for input(s): LIPASE, AMYLASE in the last 72 hours. CBC:  Recent Labs  05/14/14 0415 05/15/14 0500  WBC 13.3* 14.8*  HGB 12.3* 12.9*  HCT 38.2* 40.4  MCV 83.6 84.0  PLT 191 279   Cardiac Enzymes: No results for input(s): CKTOTAL, CKMB, CKMBINDEX, TROPONINI in the last 72 hours. BNP: Invalid input(s): POCBNP D-Dimer: No results for input(s): DDIMER in the last 72 hours. Hemoglobin A1C: No results for input(s): HGBA1C in the last 72  hours. Fasting Lipid Panel: No results for input(s): CHOL, HDL, LDLCALC, TRIG, CHOLHDL, LDLDIRECT in the last 72 hours. Thyroid Function Tests: No results for input(s): TSH, T4TOTAL, T3FREE, THYROIDAB in the last 72 hours.  Invalid input(s): FREET3 Anemia Panel: No results for input(s): VITAMINB12, FOLATE, FERRITIN, TIBC, IRON, RETICCTPCT in the last 72 hours. Coag Panel:   Lab Results  Component Value Date   INR 1.23 05/05/2014   INR 1.23 05/04/2014   INR 1.10 06/20/2010    RADIOLOGY: Ct Head Wo Contrast  05/09/2014   CLINICAL DATA:  Cerebral edema.  Status post cardiac arrest.  EXAM: CT HEAD WITHOUT CONTRAST  TECHNIQUE: Contiguous axial images were obtained from the base of the skull through the vertex without intravenous contrast.  COMPARISON:  CT scan of May 04, 2014.  FINDINGS: Bony calvarium appears intact. Bilateral ethmoid and sphenoid and left maxillary sinusitis is noted. Mild diffuse cortical atrophy is noted. Mild chronic ischemic white matter disease is noted. No mass effect or midline shift is noted. Ventricular size is within normal limits. There is no evidence of mass lesion, hemorrhage or acute infarction.  IMPRESSION: Mild diffuse cortical  atrophy. Mild chronic ischemic white matter disease. Bilateral ethmoid and sphenoid sinusitis. No acute intracranial abnormality seen.   Electronically Signed   By: Roque Lias M.D.   On: 05/09/2014 15:17   Ct Head Wo Contrast  05/04/2014   CLINICAL DATA:  Syncopal episode. Patient found down. Out of hospital cardiac arrest.  EXAM: CT HEAD WITHOUT CONTRAST  CT CERVICAL SPINE WITHOUT CONTRAST  TECHNIQUE: Multidetector CT imaging of the head and cervical spine was performed following the standard protocol without intravenous contrast. Multiplanar CT image reconstructions of the cervical spine were also generated.  COMPARISON:  Prior CT head from 08/10/2007.  FINDINGS: CT HEAD FINDINGS  No evidence for acute infarction, hemorrhage, mass  lesion, hydrocephalus, or extra-axial fluid. Mild cerebral and cerebellar atrophy. Hypoattenuation of white matter is consistent with chronic microvascular ischemic change.  The calvarium is intact. There is no sinus or mastoid air fluid level although the ethmoids are opacified. Likely post intubation. The patient is intubated, with accompanying nasopharyngeal fluid.  CT CERVICAL SPINE FINDINGS  There is no visible cervical spine fracture, traumatic subluxation, prevertebral soft tissue swelling, or intraspinal hematoma. Mild straightening of the normal cervical lordosis. Mild ossification of the posterior longitudinal ligament. Multilevel disc space narrowing from C2 through T1. Moderately advanced facet arthropathy at C2-3 on the LEFT. Vascular calcification. Central venous line. Endotracheal tube. No pneumothorax. No worrisome neck mass.  IMPRESSION: Chronic changes as described. Similar appearance to 2009. No acute intracranial findings.  No cervical spine fracture or traumatic subluxation. Advanced multilevel cervical spondylosis is noted.  Visualized support tubes and lines appear appropriately placed.  Findings discussed with consulting MD.   Electronically Signed   By: Davonna Belling M.D.   On: 05/04/2014 18:26   Ct Cervical Spine Wo Contrast  05/04/2014   CLINICAL DATA:  Syncopal episode. Patient found down. Out of hospital cardiac arrest.  EXAM: CT HEAD WITHOUT CONTRAST  CT CERVICAL SPINE WITHOUT CONTRAST  TECHNIQUE: Multidetector CT imaging of the head and cervical spine was performed following the standard protocol without intravenous contrast. Multiplanar CT image reconstructions of the cervical spine were also generated.  COMPARISON:  Prior CT head from 08/10/2007.  FINDINGS: CT HEAD FINDINGS  No evidence for acute infarction, hemorrhage, mass lesion, hydrocephalus, or extra-axial fluid. Mild cerebral and cerebellar atrophy. Hypoattenuation of white matter is consistent with chronic microvascular  ischemic change.  The calvarium is intact. There is no sinus or mastoid air fluid level although the ethmoids are opacified. Likely post intubation. The patient is intubated, with accompanying nasopharyngeal fluid.  CT CERVICAL SPINE FINDINGS  There is no visible cervical spine fracture, traumatic subluxation, prevertebral soft tissue swelling, or intraspinal hematoma. Mild straightening of the normal cervical lordosis. Mild ossification of the posterior longitudinal ligament. Multilevel disc space narrowing from C2 through T1. Moderately advanced facet arthropathy at C2-3 on the LEFT. Vascular calcification. Central venous line. Endotracheal tube. No pneumothorax. No worrisome neck mass.  IMPRESSION: Chronic changes as described. Similar appearance to 2009. No acute intracranial findings.  No cervical spine fracture or traumatic subluxation. Advanced multilevel cervical spondylosis is noted.  Visualized support tubes and lines appear appropriately placed.  Findings discussed with consulting MD.   Electronically Signed   By: Davonna Belling M.D.   On: 05/04/2014 18:26   Mr Brain Wo Contrast  05/11/2014   CLINICAL DATA:  68 year old male status post cardiac arrest an resuscitation with persistent encephalopathy, anoxic brain injury suspected. Initial encounter.  EXAM: MRI HEAD WITHOUT CONTRAST  TECHNIQUE:  Multiplanar, multiecho pulse sequences of the brain and surrounding structures were obtained without intravenous contrast.  COMPARISON:  Head CT without contrast 05/09/2004. Brain MRI 08/11/2007.  FINDINGS: No restricted diffusion or evidence of acute infarction. Signal in the deep gray matter nuclei is stable and normal for age except for incidental perivascular spaces. Brainstem and cerebellum are stable and within normal limits. There is patchy T2 and FLAIR hyperintensity in the cerebral white matter which has progressed since 2009, but is nonspecific. These areas show facilitated diffusion.  No acute  intracranial hemorrhage identified. No midline shift, mass effect, or evidence of intracranial mass lesion. No ventriculomegaly. Negative pituitary and cervicomedullary junction. Negative for age visualized cervical spine. Normal bone marrow signal.  Fluid in the pharynx. The patient is intubated. Mild bilateral mastoid effusions. Fluid in mucosal thickening in the dependent paranasal sinuses. Postoperative change to the left globe. Other orbits soft tissues are within normal limits. Visualized scalp soft tissues are within normal limits.  IMPRESSION: 1. No evidence of anoxic injury or acute intracranial abnormality. 2. Progressed but nonspecific cerebral white matter signal changes since 2009, most commonly due to chronic small vessel disease. 3. Intubated, with paranasal sinus and mastoid inflammatory changes.   Electronically Signed   By: Augusto Gamble M.D.   On: 05/11/2014 16:52   Dg Chest Port 1 View  05/14/2014   CLINICAL DATA:  Post cardiac catheterization, intubation, history CHF, coronary artery disease, diabetes, smoking, hypertension  EXAM: PORTABLE CHEST - 1 VIEW  COMPARISON:  Portable exam 0524 hr compared to 05/13/2014  FINDINGS: Tip of endotracheal tube projects 3.6 cm above carina.  Tip of LEFT jugular central venous catheter projects over SVC at the level of the azygos confluence.  Nasogastric tube extends into stomach.  Enlargement of cardiac silhouette with pulmonary vascular congestion.  Improved bibasilar aeration since previous study.  Upper lungs clear.  No pleural effusion or pneumothorax.  IMPRESSION: Improved bibasilar aeration versus previous study.   Electronically Signed   By: Ulyses Southward M.D.   On: 05/14/2014 07:34   Dg Chest Port 1 View  05/13/2014   CLINICAL DATA:  Endotracheal tube placement.  EXAM: PORTABLE CHEST - 1 VIEW  COMPARISON:  05/12/2014  FINDINGS: Endotracheal tube has tip approximately 5.3 cm above the carina. Nasogastric tube courses into the region of the stomach  and off the film as tip is not visualized. Left IJ central venous catheter is unchanged with tip obliquely oriented over the region of the SVC. Lungs are adequately inflated with mild left basilar opacification suggesting a small amount left pleural fluid/atelectasis, although cannot exclude infection. Cardiomediastinal silhouette and remainder of the exam is unchanged.  IMPRESSION: Persistent mild left basilar opacification suggesting effusion with atelectasis, although cannot exclude infection.  Tubes and lines as described.   Electronically Signed   By: Elberta Fortis M.D.   On: 05/13/2014 09:03   Dg Chest Port 1 View  05/12/2014   CLINICAL DATA:  Shortness of breath and pneumonia, intubated patient  EXAM: PORTABLE CHEST - 1 VIEW  COMPARISON:  Portable chest x-ray of May 11, 2014  FINDINGS: The lungs are well-expanded. The left hemidiaphragm the left lateral costophrenic gutter however less well demonstrated today. The pulmonary interstitial markings are mildly increased though stable. Cardiopericardial silhouette is enlarged. There is tortuosity of the descending thoracic aorta.  The endotracheal tube tip lies 3.6 cm above the crotch of the carina. The esophagogastric tube tip projects below the inferior margin of the image. Left internal jugular  venous catheter tip projects over the midportion of the SVC.  The abnormality associated with the lateral aspect of the right fifth rib is unchanged. There is no pneumothorax.  IMPRESSION: Mildly increased density at the left lung base is consistent with atelectasis and small pleural effusion. There is stable enlargement of the cardiac silhouette and mild pulmonary interstitial edema.   Electronically Signed   By: David  [place]   On: 05/12/2014 08:00   Dg Chest Port 1 View  05/11/2014   CLINICAL DATA:  Intubated patient with history of shortness of breath and pneumonia  EXAM: PORTABLE CHEST - 1 VIEW  COMPARISON:  Portable chest x-ray of May 10, 2014   FINDINGS: The lungs are well-expanded. The interstitial markings have improved dramatically. Minimal increased density persists in the retrocardiac region on the left. The cardiac silhouette remains enlarged. The pulmonary vascularity is more normal today. There is no significant pleural effusion. There is a stable appearance of the destructive lesion of the lateral aspect of the right fifth rib.  The endotracheal tube tip lies 2.9 cm above the crotch of the carina. The esophagogastric tube tip projects below the inferior margin of the image. The the observed bony thorax is unremarkable. Left internal jugular venous catheter tip projects over the proximal SVC.  IMPRESSION: Dramatic improvement in the appearance of the lungs since yesterday's study consistent with resolving interstitial edema and/or pneumonia. Persistent left lower lobe atelectasis or pneumonia is present. Abnormal contour of the lateral aspect of the right fifth rib is unchanged.   Electronically Signed   By: David  [place]   On: 05/11/2014 07:36   Dg Chest Port 1 View  05/10/2014   CLINICAL DATA:  68 year old male with shortness of breath and pneumonia. Initial encounter.  EXAM: PORTABLE CHEST - 1 VIEW  COMPARISON:  05/09/2014 and earlier.  FINDINGS: Portable AP semi upright view at 0438 hrs. Stable endotracheal tube. Stable left IJ central line. Stable visualized enteric tube.  Continued dense retrocardiac opacity and veiling opacity at both lung bases. Stable pulmonary vascularity. No pneumothorax. The patient is more rotated to the right.  Evidence of right lateral fourth or fifth rib destruction (arrow). The right lateral ribs were normal on 08/10/2007 chest CT. No other acute osseous abnormality identified.  IMPRESSION: 1.  Stable lines and tubes. 2. Stable ventilation with bilateral pleural effusions and lower lobe collapse or consolidation. 3. Destruction versus fracture of the right lateral fourth or fifth rib (arrow). Has there been  recent CPR or is there a known malignancy?   Electronically Signed   By: Augusto Gamble M.D.   On: 05/10/2014 06:20   Dg Chest Port 1 View  05/09/2014   CLINICAL DATA:  Assess ET tube position.  EXAM: PORTABLE CHEST - 1 VIEW  COMPARISON:  05/08/2014  FINDINGS: The endotracheal tube is 5.3 cm above the carina. The NG tube is stable. The left IJ catheter is stable. Persistent cardiac enlargement and vascular congestion. Small pleural effusions and bibasilar atelectasis.  IMPRESSION: Stable support apparatus.  Persistent vascular congestion, mild edema, small effusions and bibasilar atelectasis.   Electronically Signed   By: Loralie Champagne M.D.   On: 05/09/2014 07:40   Dg Chest Port 1 View  05/08/2014   CLINICAL DATA:  68 year old male status post central line placement.  EXAM: PORTABLE CHEST - 1 VIEW  COMPARISON:  05/08/2014, 5:08 a.m.  FINDINGS: Cardiomediastinal silhouette unchanged in size and contour. Fullness of the central vasculature again evident.  Diffuse interstitial and airspace  opacities. Improved aeration at the bases of the lungs.  Interval placement of left IJ central venous catheter, which terminates in the region of the left brachycephalic vein and superior vena cava confluence. No evidence of pneumothorax.  Gastric tube again projects over the mediastinum, terminating out of the field of view. The gastric port projects over the region of the stomach.  Endotracheal tube is unchanged terminating suitably above the carina.  IMPRESSION: Interval placement of left IJ central venous catheter, which terminates at the confluence of the superior vena cava and left brachycephalic vein. No evidence of pneumothorax.  Otherwise unchanged support apparatus.  Similar appearance of mixed interstitial and airspace disease, compatible with edema.  Improved aeration at the bilateral lung bases.  Signed,  Yvone Neu. Loreta Ave, DO  [facility]  [facility]   Electronically  Signed   By: Gilmer Mor D.O.   On: 05/08/2014 17:30   Dg Chest Port 1 View  05/08/2014   CLINICAL DATA:  Ventilator dependent respiratory failure. Acute respiratory acidosis. Shortness of breath. Prior history of CHF.  EXAM: PORTABLE CHEST - 1 VIEW  COMPARISON:  Portable chest x-rays yesterday dating back to 05/04/2014.  FINDINGS: Endotracheal tube tip in satisfactory position projecting approximately 3 cm above the carina. Nasogastric tube courses below the diaphragm into the stomach. Cardiac silhouette moderately to markedly enlarged but stable. Interval development of mild-to-moderate diffuse interstitial and airspace pulmonary edema since yesterday. Developing bilateral pleural effusions and associated dense passive atelectasis in the lower lobes.  IMPRESSION: 1. Support apparatus satisfactory. 2. New moderate CHF, with interstitial and airspace pulmonary edema and moderate-sized bilateral pleural effusions. Associated passive atelectasis in the lower lobes.   Electronically Signed   By: Hulan Saas M.D.   On: 05/08/2014 07:43   Dg Chest Port 1 View  05/07/2014   CLINICAL DATA:  Cardiac arrest, history of asthma  EXAM: PORTABLE CHEST - 1 VIEW  COMPARISON:  05/06/2014  FINDINGS: Cardiomediastinal silhouette is stable. Endotracheal tube in place with tip 4.5 cm above the carina. Stable NG tube position. Mild basilar atelectasis again noted. No segmental infiltrate or pulmonary edema  IMPRESSION: Stable support apparatus. Again noted mild basilar atelectasis. No segmental infiltrate or pulmonary edema.   Electronically Signed   By: Natasha Mead M.D.   On: 05/07/2014 11:22   Dg Chest Port 1 View  05/06/2014   CLINICAL DATA:  Respiratory failure  EXAM: PORTABLE CHEST - 1 VIEW  COMPARISON:  05/05/1949  FINDINGS: An endotracheal tube is again noted 2.8 cm above the carina. A nasogastric catheter is noted within the stomach. The cardiac shadow remains enlarged. Mild bibasilar atelectatic changes are  seen. No significant vascular congestion is noted.  IMPRESSION: Mild bibasilar atelectatic changes.  Tubes and lines as described.   Electronically Signed   By: Alcide Clever M.D.   On: 05/06/2014 07:22   Dg Chest Port 1 View  05/05/2014   CLINICAL DATA:  Evaluate airspace disease  EXAM: PORTABLE CHEST - 1 VIEW  COMPARISON:  05/04/2014  FINDINGS: Endotracheal tube tip between the clavicular heads and carina. A gastric suction tube enters the stomach. Right subclavian central line, malpositioned on the previous study, has been removed.  Unchanged cardiomegaly.  Stable aortic contours.  Pulmonary venous congestion has decreased but there is now hazy and streaky bibasilar opacities. No pneumothorax.  IMPRESSION: 1. New orogastric tube is in good position. 2. Improved pulmonary edema. 3. Worsening basilar aeration, likely layering small effusions and atelectasis. Pneumonia  cannot be excluded.   Electronically Signed   By: Tiburcio PeaJonathan  Watts M.D.   On: 05/05/2014 08:00   Dg Chest Portable 1 View  05/04/2014   CLINICAL DATA:  Status post cardiac arrest and CPR.  Intubated.  EXAM: PORTABLE CHEST - 1 VIEW  COMPARISON:  06/13/2010.  FINDINGS: Interval endotracheal tube with its tip 3.6 cm above the carina. Breast of enlargement of the cardiac silhouette with increased prominence of the pulmonary vasculature and interstitial markings. No pleural fluid seen. No pneumothorax. An interval epicardial pacemaker lead is in place. Right subclavian catheter extending into the right neck, its tip not included.  IMPRESSION: 1. Malpositioned right subclavian catheter extending into the neck on the right. 2. Progressive cardiomegaly and changes of congestive heart failure. These results will be called to the ordering clinician or representative by the Radiologist Assistant, and communication documented in the PACS or zVision Dashboard.   Electronically Signed   By: Gordan PaymentSteve  Reid M.D.   On: 05/04/2014 17:47   Dg Abd Portable  1v  05/04/2014   CLINICAL DATA:  Orogastric tube placement.  EXAM: PORTABLE ABDOMEN - 1 VIEW  COMPARISON:  08/11/2007.  FINDINGS: Orogastric tube tip in the mid to distal stomach. The visualized bowel gas pattern is normal. The patient is rotated to the right. Possible hiatal hernia and right basilar airspace opacity.  IMPRESSION: 1. Orogastric tube tip in the mid to distal stomach. 2. Possible hiatal hernia. 3. Mild patchy atelectasis or pneumonia at the right lung base.   Electronically Signed   By: Gordan PaymentSteve  Reid M.D.   On: 05/04/2014 21:39   Principal Problem:   Cardiac arrest Active Problems:   Chronic hepatitis C   DM2 (diabetes mellitus, type 2)   Essential hypertension   Peripheral vascular disease   VF (ventricular fibrillation)   Nonischemic cardiomyopathy   Hx of AKA (above knee amputation)   Acute respiratory acidosis   Respiratory failure   Cerebral edema   Anoxic brain injury   Pneumonia     ASSESSMENT: 68 yo man with pmh of non-ishemic cardiomyapthy with EF 30-35% on echo in 2011, HTN, HL, PVD, Type II DM s/p left AKA, past cocaine abuse, tobacco abuse, and chronic LBBB who presented with vfib arrest with ROSC found to have non-obstructive catherization.   PLAN:   Vfib Arrest s/p Non-obstructive cardiac catherization -  Pt s/p non-obstructive cardiac catherization and  hypothermic protocol. Etiology unclear, possible due to non-ischemic cardiomyopathy in setting of chronic LBBB. He had one prior cardiac arrest approximately 10 yrs ago per family. EP to place ICD vs life vest pending neurological recovery.    Probable MRSA Pneumonia - Curently on IV vancomycin day 6 for MRSA found on tracheal aspirate. Last CXR was improved. Management per PCCM.  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 Also with grade 1 diastolic dysfunction. Pt with pmh of cocaine abuse. Last CXR improved. IV lasix per PCCM.  Pt at home on  lasix 80 mg BID and enalapril 20 mg BID.  EP to place ICD vs life vest pending neurological recovery.    Acute Encepalopathy - Much improved. Currently on depakote 500 mg BID per neurology. MRI brain without anoxic injury. Multifactorial per neurology. Hypernatremia -  Most likely hypovolemic in setting of recent IV lasix. Management per PCCM.  Hypertension - Currently normotensive on carvedilol 12.5 mg BID and bidil 20-37.5 mg 2 tabs BID. Pt at home on amlodipine  10 mg daily, enalapril 20 mg BID, lasix 80 mg BID, and coreg 25 mg BID.  Acute Normocytic Anemia - Pending AM labs. Hg yesterday stable at 12.9 below baseline 14. FOBT negative. UA with trace hematuria. Currently on SQ heparin for DVT ppx. Monitor for bleeding.  Nonoliguric AKI CKD Stage 3 - Pending AM labs. Baseline 1.5 Etiology pre-renal due to volume depletion vs ischemic ATN.  Continue to monitor.  Hypokalemia - Pending AM labs. Continue to monitor.  Hyperlipidemia - Continue home liptor 10 mg daily.  PVD - Pt at home on aspirin 81 mg daily. Hold in setting of acute anemia.  Non-Insulin Dependent Type II DM - A1c 6.9.  Pt is s/p left AKA. Hold home metformin and glipizide. Pt is on moderate SSI.  Hepatitis C Infection - HCV viral load 1,191,478.  Normal AST/ALT with chronic hypoalbuminemia. Anti-sHBV negative indicating no prior HBC vaccination.     Otis Brace, MD  PGY-II IMTS Pager [phone] 05/16/2014  6:39 AM   History and all data above reviewed.  Patient examined.  I agree with the findings as above. No complaints  The patient exam reveals COR:RRR  ,  Lungs: Decreased breath sounds  ,  Abd: Positive bowel sounds, no rebound no guarding, Ext No edema  .  All available labs, radiology testing, previous records reviewed. Agree with documented assessment and plan. CM:  No ACE inhibitor at present with increased creat.  Likely restart before discharge.  OK to transfer.  ICD prior to discharge.  Timing per EP.    Rollene Rotunda   9:38 AM  05/16/2014

## 2014-05-17 DIAGNOSIS — R404 Transient alteration of awareness: Secondary | ICD-10-CM

## 2014-05-17 DIAGNOSIS — N179 Acute kidney failure, unspecified: Secondary | ICD-10-CM | POA: Diagnosis present

## 2014-05-17 LAB — BASIC METABOLIC PANEL
Anion gap: 17 — ABNORMAL HIGH (ref 5–15)
BUN: 35 mg/dL — ABNORMAL HIGH (ref 6–23)
CO2: 22 mEq/L (ref 19–32)
CREATININE: 1.96 mg/dL — AB (ref 0.50–1.35)
Calcium: 8.4 mg/dL (ref 8.4–10.5)
Chloride: 103 mEq/L (ref 96–112)
GFR, EST AFRICAN AMERICAN: 38 mL/min — AB (ref >90)
GFR, EST NON AFRICAN AMERICAN: 33 mL/min — AB (ref >90)
Glucose, Bld: 106 mg/dL — ABNORMAL HIGH (ref 70–99)
POTASSIUM: 4.1 meq/L (ref 3.7–5.3)
Sodium: 142 mEq/L (ref 137–147)

## 2014-05-17 LAB — GLUCOSE, CAPILLARY
Glucose-Capillary: 118 mg/dL — ABNORMAL HIGH (ref 70–99)
Glucose-Capillary: 129 mg/dL — ABNORMAL HIGH (ref 70–99)
Glucose-Capillary: 110 mg/dL — ABNORMAL HIGH (ref 70–99)
Glucose-Capillary: 134 mg/dL — ABNORMAL HIGH (ref 70–99)

## 2014-05-17 LAB — VANCOMYCIN, TROUGH: Vancomycin Tr: 24.1 ug/mL — ABNORMAL HIGH (ref 10.0–20.0)

## 2014-05-17 MED ORDER — SODIUM CHLORIDE 0.45 % IV SOLN
INTRAVENOUS | Status: DC
  Administered 2014-05-19: 10 mL/h via INTRAVENOUS

## 2014-05-17 NOTE — Progress Notes (Signed)
CSW (Clinical Child psychotherapist) provided pt with bed offers. Pt oriented during conversation and expressed understanding. Pt wanting to talk with his sister before accepting a bed. Pt did express that he will likely choose one of the facilities close to the hospital. Pt is aware of potential for dc Friday.  Abrar Bilton, LCSWA [phone]

## 2014-05-17 NOTE — Progress Notes (Signed)
ANTIBIOTIC CONSULT NOTE - FOLLOW UP  Pharmacy Consult for vancomycin Indication: pneumonia  Labs:  Recent Labs  05/15/14 0500 05/16/14 0700  WBC 14.8* 14.7*  HGB 12.9* 11.5*  PLT 279 280  CREATININE 1.80* 2.01*   Estimated Creatinine Clearance: 37.5 mL/min (by C-G formula based on Cr of 2.01).  Recent Labs  05/17/14 0357  VANCOTROUGH 24.1*     Microbiology: Recent Results (from the past 720 hour(s))  Blood culture (routine x 2)     Status: None   Collection Time: 05/04/14  5:35 PM  Result Value Ref Range Status   Specimen Description BLOOD  Final   Special Requests   Final    BOTTLES DRAWN AEROBIC AND ANAEROBIC 5CC RT SUBCLAVIAN   Culture  Setup Time   Final    05/05/2014 01:30 Performed at [facility]    Culture   Final    NO GROWTH 5 DAYS Performed at [facility]    Report Status 05/11/2014 FINAL  Final  MRSA PCR Screening     Status: None   Collection Time: 05/04/14  7:39 PM  Result Value Ref Range Status   MRSA by PCR NEGATIVE NEGATIVE Final    Comment:        The GeneXpert MRSA Assay (FDA approved for NASAL specimens only), is one component of a comprehensive MRSA colonization surveillance program. It is not intended to diagnose MRSA infection nor to guide or monitor treatment for MRSA infections.   Blood culture (routine x 2)     Status: None   Collection Time: 05/04/14  9:00 PM  Result Value Ref Range Status   Specimen Description BLOOD LEFT ARM  Final   Special Requests BOTTLES DRAWN AEROBIC ONLY 2CC  Final   Culture  Setup Time   Final    05/05/2014 04:08 Performed at [facility]    Culture   Final    NO GROWTH 5 DAYS Performed at [facility]    Report Status 05/11/2014 FINAL  Final  Culture, respiratory (NON-Expectorated)     Status: None   Collection Time: 05/07/14  4:40 PM  Result Value Ref Range Status   Specimen Description TRACHEAL ASPIRATE  Final   Special Requests NONE  Final   Gram Stain    Final    ABUNDANT WBC PRESENT,BOTH PMN AND MONONUCLEAR RARE SQUAMOUS EPITHELIAL CELLS PRESENT MODERATE GRAM NEGATIVE COCCOBACILLI FEW GRAM POSITIVE COCCI IN PAIRS RARE GRAM NEGATIVE COCCI    Culture   Final    MODERATE METHICILLIN RESISTANT STAPHYLOCOCCUS AUREUS Note: RIFAMPIN AND GENTAMICIN SHOULD NOT BE USED AS SINGLE DRUGS FOR TREATMENT OF STAPH INFECTIONS. This organism DOES NOT demonstrate inducible Clindamycin resistance in vitro. CRITICAL RESULT CALLED TO, READ BACK BY AND VERIFIED WITH: SARA@7 :22AM ON  05/11/14 BY DANTS Performed at [facility]    Report Status 05/12/2014 FINAL  Final   Organism ID, Bacteria METHICILLIN RESISTANT STAPHYLOCOCCUS AUREUS  Final      Susceptibility   Methicillin resistant staphylococcus aureus - MIC*    CLINDAMYCIN <=0.25 SENSITIVE Sensitive     ERYTHROMYCIN >=8 RESISTANT Resistant     GENTAMICIN <=0.5 SENSITIVE Sensitive     LEVOFLOXACIN 0.25 SENSITIVE Sensitive     OXACILLIN >=4 RESISTANT Resistant     PENICILLIN >=0.5 RESISTANT Resistant     RIFAMPIN <=0.5 SENSITIVE Sensitive     TRIMETH/SULFA <=10 SENSITIVE Sensitive     VANCOMYCIN 1 SENSITIVE Sensitive     TETRACYCLINE <=1 SENSITIVE Sensitive     *  MODERATE METHICILLIN RESISTANT STAPHYLOCOCCUS AUREUS  Clostridium Difficile by PCR     Status: None   Collection Time: 05/10/14  1:13 PM  Result Value Ref Range Status   C difficile by pcr NEGATIVE NEGATIVE Final    Anti-infectives    Start     Dose/Rate Route Frequency Ordered Stop   05/15/14 1700  vancomycin (VANCOCIN) 500 mg in sodium chloride 0.9 % 100 mL IVPB     500 mg100 mL/hr over 60 Minutes Intravenous Every 12 hours 05/15/14 1443 05/19/14 0559   05/14/14 0400  vancomycin (VANCOCIN) 500 mg in sodium chloride 0.9 % 100 mL IVPB  Status:  Discontinued     500 mg100 mL/hr over 60 Minutes Intravenous Every 12 hours 05/13/14 2349 05/15/14 1218   05/11/14 2200  vancomycin (VANCOCIN) IVPB 750 mg/150 ml premix  Status:   Discontinued     750 mg150 mL/hr over 60 Minutes Intravenous Every 12 hours 05/11/14 0929 05/13/14 2348   05/11/14 1200  cefTRIAXone (ROCEPHIN) 1 g in dextrose 5 % 50 mL IVPB - Premix     1 g100 mL/hr over 30 Minutes Intravenous Every 24 hours 05/11/14 1008 05/13/14 1144   05/11/14 1000  vancomycin (VANCOCIN) 1,500 mg in sodium chloride 0.9 % 500 mL IVPB     1,500 mg250 mL/hr over 120 Minutes Intravenous  Once 05/11/14 0929 05/11/14 1211   05/09/14 1200  cefTRIAXone (ROCEPHIN) 1 g in dextrose 5 % 50 mL IVPB - Premix  Status:  Discontinued     1 g100 mL/hr over 30 Minutes Intravenous Every 24 hours 05/09/14 1114 05/11/14 0928   05/07/14 1300  Ampicillin-Sulbactam (UNASYN) 3 g in sodium chloride 0.9 % 100 mL IVPB  Status:  Discontinued     3 g100 mL/hr over 60 Minutes Intravenous Every 6 hours 05/07/14 1205 05/09/14 1114      Assessment/Plan:  68yo male w/ high vanc level though last dose was hung late 2.75hr late and true trough is closer to 18.  Will continue vanc for now and check another level to confirm.  Vernard Gambles, PharmD, BCPS  05/17/2014,5:59 AM

## 2014-05-17 NOTE — Progress Notes (Signed)
Patient Name: Jonathon Galvan Date of Encounter: 05/17/2014  Active Problems:   Chronic hepatitis C   DM2 (diabetes mellitus, type 2)   Essential hypertension   Peripheral vascular disease   Cardiac arrest   VF (ventricular fibrillation)   Nonischemic cardiomyopathy   Hx of AKA (above knee amputation)   Aspiration pneumonia   Acute delirium   Acute respiratory failure with hypoxia   Ventricular fibrillation   HCAP (healthcare-associated pneumonia)   MRSA pneumonia   Chronic hepatitis C without hepatic coma   Primary Cardiologist: Dr. Excell Seltzer? Or Dr Antoine Poche?  Patient Profile: 68 yo man with pmh of non-ishemic cardiomyapthy with EF 30-35% on echo in 2011, HTN, HL, PVD, Type II DM s/p left AKA, past cocaine abuse, tobacco abuse, and chronic LBBB who presented with vfib arrest on 11/12, ROSC, found to have non-obstructive dz at catherization.   SUBJECTIVE: Breathing OK, tired. No chest pain.  OBJECTIVE Filed Vitals:   05/16/14 2047 05/17/14 0000 05/17/14 0400 05/17/14 0829  BP: 140/63 140/63 129/64 124/60  Pulse: 71 61 55 64  Temp: 98.1 F (36.7 C) 99.5 F (37.5 C) 96.7 F (35.9 C)   TempSrc: Oral     Resp: 18 25 23    Height:      Weight:   191 lb (86.637 kg)   SpO2: 94% 99% 97%     Intake/Output Summary (Last 24 hours) at 05/17/14 0915 Last data filed at 05/17/14 0436  Gross per 24 hour  Intake      0 ml  Output    701 ml  Net   -701 ml   Filed Weights   05/16/14 0430 05/16/14 1141 05/17/14 0400  Weight: 186 lb 11.7 oz (84.7 kg) 187 lb 9.6 oz (85.095 kg) 191 lb (86.637 kg)    PHYSICAL EXAM General: Well developed, well nourished, male in no acute distress. Head: Normocephalic, atraumatic.  Neck: Supple without bruits, JVD not elevated Lungs:  Resp regular and unlabored, few rales bases. Heart: RRR, S1, S2, no S3, S4, 3/6 murmur; no rub. Abdomen: Soft, non-tender, non-distended, BS + x 4.  Extremities: No clubbing, cyanosis, no edema.  Neuro: Alert  and oriented X 3. Moves all extremities spontaneously. Psych: Normal affect.  LABS: CBC: Recent Labs  05/15/14 0500 05/16/14 0700  WBC 14.8* 14.7*  HGB 12.9* 11.5*  HCT 40.4 36.2*  MCV 84.0 82.3  PLT 279 280   INR:No results for input(s): INR in the last 72 hours. Basic Metabolic Panel: Recent Labs  05/15/14 0500 05/16/14 0700  NA 149* 147  K 3.6* 4.1  CL 105 107  CO2 29 28  GLUCOSE 120* 121*  BUN 33* 35*  CREATININE 1.80* 2.01*  CALCIUM 9.2 8.8  MG 2.7* 2.9*  PHOS 3.3  --    Hemoglobin A1C: Recent Labs  05/16/14 0800  HGBA1C 6.5*   TELE:  SR, sinus brady  Current Medications:  . atorvastatin  10 mg Oral q1800  . carvedilol  12.5 mg Oral BID WC  . divalproex  500 mg Oral Q12H  . feeding supplement (ENSURE COMPLETE)  237 mL Oral BID BM  . glipiZIDE  10 mg Oral QAC breakfast  . heparin subcutaneous  5,000 Units Subcutaneous 3 times per day  . insulin aspart  0-5 Units Subcutaneous QHS  . insulin aspart  0-9 Units Subcutaneous TID WC  . isosorbide-hydrALAZINE  2 tablet Oral BID  . nicotine  7 mg Transdermal Daily  . vancomycin  500 mg  Intravenous Q12H      ASSESSMENT AND PLAN: Active Problems:   Chronic hepatitis C   DM2 (diabetes mellitus, type 2)   Essential hypertension   Peripheral vascular disease   Cardiac arrest   VF (ventricular fibrillation)   Nonischemic cardiomyopathy   Hx of AKA (above knee amputation)   Aspiration pneumonia   Acute delirium   Acute respiratory failure with hypoxia   Ventricular fibrillation   HCAP (healthcare-associated pneumonia)   MRSA pneumonia   Chronic hepatitis C without hepatic coma  Vfib Arrest s/p Non-obstructive cardiac catherization - Pt w/ non-obs dz at cath, s/p hypothermic protocol. Etiology unclear, EP has seen, possible ICD vs life vest pending neurological recovery.   Probable MRSA Pneumonia - Curently on IV vancomycin. Management per PCCM.   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 Also with grade 1 diastolic dysfunction. Last CXR improved. Now off IV lasix. Pt at home on lasix 80 mg BID, Coreg 25 mg bid, and enalapril 20 mg BID. Currently on Coreg 12.5 mg bid and BiDil. No ACE/ARB due to decreased renal function, MD advise on restarting diuretic.   Acute Encepalopathy - Much improved. Currently on depakote 500 mg BID per neurology. MRI brain without anoxic injury. Multifactorial per neurology.  Hypernatremia - ? 2nd hypovolemic in setting of recent IV lasix. Improved.   Hypertension - BP creeping up on carvedilol 12.5 mg BID and bidil 20-37.5 mg 2 tabs BID. Pt at home on amlodipine 10 mg daily, enalapril 20 mg BID, lasix 80 mg BID, and coreg 25 mg BID.   Acute Normocytic Anemia - Pending AM labs. Per IM,  FOBT negative. UA with trace hematuria. Currently on SQ heparin for DVT ppx.  Nonoliguric AKI CKD Stage 3 - per IM. Baseline 1.5 Etiology pre-renal due to volume depletion vs ischemic ATN. Continue to monitor.   Hypokalemia - Per IM. Improved.   Hyperlipidemia - Continue home liptor 10 mg daily.   PVD - Pt at home on aspirin 81 mg daily. Currently on hold in setting of acute anemia.   Non-Insulin Dependent Type II DM - per IM. A1c 6.9. Pt is s/p left AKA. Home metformin and glipizide on hold. Pt is on moderate SSI.   Hepatitis C Infection - per IM. HCV viral load [id number]. Normal AST/ALT with chronic hypoalbuminemia. Anti-sHBV negative indicating no prior HBC vaccination.   Plan - SNF recommended by PT. Pt will need decision on LifeVest vs ICD prior to d/c. MD advise on asking EP to see again, pt otherwise seems close to d/c.   Signed, Theodore Demarkhonda Barrett , PA-C 9:15 AM 05/17/2014   The patient was seen, examined and discussed with Theodore Demarkhonda Barrett, PA-C and I agree with the above.   [age] year old male post cardiac arrest due to non-ischemic CMP, with significant neurologic impairment,  now improving - still AAO x 1 only. On BiDil, no ACEI as Crea 2.0, and worsening, appears euvolemic and comfortable. BP controlled today. Close to be discharged to SNF. We will ask EP to round for re-evaluation for possible ICD vs LifeVest.  Lars MassonELSON, Jewelia Bocchino H 05/17/2014

## 2014-05-17 NOTE — Progress Notes (Addendum)
[facility] PROGRESS NOTE  NALU LABA IBB:[id number] DOB: [birth year] DOA: 05/04/2014 PCP: Quitman Livings, MD    Brief narrative: 68 year old male patient with extensive past medical history including nonischemic cardiomyopathy admitted to the hospital after undergoing a ventricular fibrillation arrest.  Upon arrival of EMS the patient was noted to be in ventricular fibrillation he underwent defibrillation and 5 minutes of CPR. After presenting to the emergency department he arrested again for another 60 seconds; emergent pacing begun and patient stabilized.  He was admitted to the ICU where he remained intubated for 10 days. It was suspected patient had associated aspiration pneumonitis in the setting of cardiac arrest. Because of presenting with ventricular fibrillation hypothermia protocol was implemented. Cardiology was consulted. 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 All of echocardiogram on 1114 revealed an EF of 30-35%.  On 11/17 patient developed extreme agitation consistent with acute delirium. Neurology was consulted. CT of the head as well as MRI of the brain were unrevealing. IV Depakote was initiated to treat acute delirium.  He was eventually extubated and transferred to the stepdown unit. He was evaluated by CIR who recommended patient be discharged to skilled nursing facility. EP Cardiology has documented that he may be a possible candidate for defibrillator vs life vest prior to discharge.  MAJOR EVENTS/TEST RESULTS: 11/12 Admission via ED after  VF arrest  11/12 LHC: Nonobstructive CAD. LVEF approx 40-45% 11/12 Hypothermia protocol implemented  11/12 CT head: NAICP 11/12 CT neck: No cervical spine fracture or traumatic subluxation. Advanced multilevel cervical spondylosis is noted 11/14 echo>>>30% to 35%. Dyskinesis of the mid-apicalanteroseptal myocardium. Doppler parameters are consistent with abnormal  left ventricular relaxation (grade 1 diastolic dysfunction). 11/16 eeg>>>no seizure, generalized slowing, c/w hypoxic encephalopathy  11/17: several nurses holding him down. Very agitated. Still w/ copious secretions from ETT. Started on diprivan, precedex stopped. Also started on clonazepam and Seroquel.  11/23 Passed swallow eval 11/24: transferred to medical floor  Assessment/Plan: PRINCIPLE PROBLEM  Cardiac arrest/VF (ventricular fibrillation)/Nonischemic cardiomyopathy -EP and Cards following -EP plan on ICD prior to discharge. -cont BiDil -no evidence of CHF decompensation at this time   Acute delirium -Neuro following -was started on Keppra for acute delirium.  symptoms improving. Will ask neuro if dose needs to be tapered -transition to PO dosing  Acute on chronic kidney ds ? Prerenal vs cardiorenal. Has good UOP. Baseline cr of 1.5, slowly improving. Monitor in am    Essential hypertension -cont BiDil    Acute respiratory failure with hypoxia/MRSA positive post Aspiration pneumonia ? HCAP -stable on [place] oxygen -cont Vancomycin until 11/26   Chronic hepatitis C Normal LFTs . Follow up as outpt   DM2 (diabetes mellitus, type 2) -CBGs stable -resume Glucotrol -cont SSI   Peripheral vascular disease/Hx of AKA  -dc to SNF possibly on 11/27   Leucocytosis  no signs of active infection. follow win am.   DVT prophylaxis: Subcutaneous heparin  Code Status: Full Family Communication: wife at bedside Disposition Plan: to SNF post ICD implant 11/27or 11/28   Consultants: Cardiology EP PCCM Neurology     HPI/Subjective: Patient seen and examined. Denies SOB or chest discomfort. Still drowsy during conversation  Objective: Filed Vitals:   05/17/14 1200  BP: 103/54  Pulse: 63  Temp: 98.7 F (37.1 C)  Resp: 20    Intake/Output Summary (Last 24 hours) at 05/17/14 1516 Last data filed at 05/17/14 1300  Gross per 24 hour  Intake  840 ml   Output    701 ml  Net    139 ml   Filed Weights   05/16/14 0430 05/16/14 1141 05/17/14 0400  Weight: 84.7 kg (186 lb 11.7 oz) 85.095 kg (187 lb 9.6 oz) 86.637 kg (191 lb)    Exam:   General:  Elderly male in NAD  HEENT: moist oral mucosa  Cardiovascular: NS1&S2, no murmurs  Chest: clear b/l   abd: soft, NT, ND, BS+  Ext: warm, left AKA, no edema   CNS; alert and oriented, gets sleepy during conversation  Data Reviewed: Basic Metabolic Panel:  Recent Labs Lab 05/11/14 0350 05/12/14 0430 05/13/14 0400 05/14/14 0415 05/15/14 0500 05/16/14 0700 05/17/14 0357  NA 153* 152* 149* 135* 149* 147 142  K 4.3 3.9 4.0 3.9 3.6* 4.1 4.1  CL 118* 111 106 99 105 107 103  CO2 24 28 29 27 29 28 22   GLUCOSE 151* 174* 179* 100* 120* 121* 106*  BUN 19 22 28* 18 33* 35* 35*  CREATININE 1.68* 1.96* 2.03* 1.30 1.80* 2.01* 1.96*  CALCIUM 8.1* 8.9 8.8 8.2* 9.2 8.8 8.4  MG 2.3 2.5 2.4 1.8 2.7* 2.9*  --   PHOS 3.7 5.3* 5.0* 2.9 3.3  --   --    Liver Function Tests: No results for input(s): AST, ALT, ALKPHOS, BILITOT, PROT, ALBUMIN in the last 168 hours. No results for input(s): LIPASE, AMYLASE in the last 168 hours.  Recent Labs Lab 05/13/14 0400  AMMONIA 32   CBC:  Recent Labs Lab 05/12/14 0430 05/13/14 0400 05/14/14 0415 05/15/14 0500 05/16/14 0700  WBC 11.4* 12.3* 13.3* 14.8* 14.7*  HGB 11.4* 11.3* 12.3* 12.9* 11.5*  HCT 36.6* 36.0* 38.2* 40.4 36.2*  MCV 83.2 83.3 83.6 84.0 82.3  PLT 220 213 191 279 280   Cardiac Enzymes: No results for input(s): CKTOTAL, CKMB, CKMBINDEX, TROPONINI in the last 168 hours. BNP (last 3 results) No results for input(s): PROBNP in the last 8760 hours. CBG:  Recent Labs Lab 05/16/14 1139 05/16/14 1710 05/16/14 2056 05/17/14 0807 05/17/14 1206  GLUCAP 171* 130* 181* 118* 129*    Recent Results (from the past 240 hour(s))  Culture, respiratory (NON-Expectorated)     Status: None   Collection Time: 05/07/14  4:40 PM  Result  Value Ref Range Status   Specimen Description TRACHEAL ASPIRATE  Final   Special Requests NONE  Final   Gram Stain   Final    ABUNDANT WBC PRESENT,BOTH PMN AND MONONUCLEAR RARE SQUAMOUS EPITHELIAL CELLS PRESENT MODERATE GRAM NEGATIVE COCCOBACILLI FEW GRAM POSITIVE COCCI IN PAIRS RARE GRAM NEGATIVE COCCI    Culture   Final    MODERATE METHICILLIN RESISTANT STAPHYLOCOCCUS AUREUS Note: RIFAMPIN AND GENTAMICIN SHOULD NOT BE USED AS SINGLE DRUGS FOR TREATMENT OF STAPH INFECTIONS. This organism DOES NOT demonstrate inducible Clindamycin resistance in vitro. CRITICAL RESULT CALLED TO, READ BACK BY AND VERIFIED WITH: SARA@7 :22AM ON  05/11/14 BY DANTS Performed at [facility]    Report Status 05/12/2014 FINAL  Final   Organism ID, Bacteria METHICILLIN RESISTANT STAPHYLOCOCCUS AUREUS  Final      Susceptibility   Methicillin resistant staphylococcus aureus - MIC*    CLINDAMYCIN <=0.25 SENSITIVE Sensitive     ERYTHROMYCIN >=8 RESISTANT Resistant     GENTAMICIN <=0.5 SENSITIVE Sensitive     LEVOFLOXACIN 0.25 SENSITIVE Sensitive     OXACILLIN >=4 RESISTANT Resistant     PENICILLIN >=0.5 RESISTANT Resistant     RIFAMPIN <=0.5 SENSITIVE Sensitive  TRIMETH/SULFA <=10 SENSITIVE Sensitive     VANCOMYCIN 1 SENSITIVE Sensitive     TETRACYCLINE <=1 SENSITIVE Sensitive     * MODERATE METHICILLIN RESISTANT STAPHYLOCOCCUS AUREUS  Clostridium Difficile by PCR     Status: None   Collection Time: 05/10/14  1:13 PM  Result Value Ref Range Status   C difficile by pcr NEGATIVE NEGATIVE Final     Studies: No results found.  Scheduled Meds: . atorvastatin  10 mg Oral q1800  . carvedilol  12.5 mg Oral BID WC  . divalproex  500 mg Oral Q12H  . feeding supplement (ENSURE COMPLETE)  237 mL Oral BID BM  . glipiZIDE  10 mg Oral QAC breakfast  . heparin subcutaneous  5,000 Units Subcutaneous 3 times per day  . insulin aspart  0-5 Units Subcutaneous QHS  . insulin aspart  0-9 Units Subcutaneous  TID WC  . isosorbide-hydrALAZINE  2 tablet Oral BID  . nicotine  7 mg Transdermal Daily  . vancomycin  500 mg Intravenous Q12H   Continuous Infusions:      Time spent: 25 minutes    Damin Salido  [facility] Pager [phone]. If 7PM-7AM, please contact night-coverage at [email or web address], password [email or web address] 05/17/2014, 3:16 PM  LOS: 13 days

## 2014-05-17 NOTE — Progress Notes (Signed)
Patient Care Team: Quitman Livings, MD as PCP - General (Internal Medicine)   HPI  Jonathon Galvan is a 68 y.o. male Denies chest pain or sob  Past Medical History  Diagnosis Date  . CHF (congestive heart failure)     EF 30-35%, nonischemic  . CAD (coronary artery disease)   . Hyperlipidemia     hypertriglyceridemia on niaspan and lipitor.  . Asthma   . Allergy   . PVD (peripheral vascular disease)     s/p left AKA @ [facility] (MRSA septic arthritis)  . Diabetes mellitus     dx in 2009, DKA, came unresponsive  . Tobacco abuse   . Hepatitis C   . Hypertension   . LBBB (left bundle branch block)   . Anemia of chronic disease     baseline Hgb now 13  . Thrombocytopenia     07/2007, went down to 90,000. likely 2/2 neurontin.  . Cocaine use     s/p cardiopulmonary arrest 12/25/2002 ([facility])  . Cholelithiasis     documented on CT abdomen in 07/2007    Past Surgical History  Procedure Laterality Date  . Left leg mrsa infection s/p aka    . Left shoulder cyst removal      Current Facility-Administered Medications  Medication Dose Route Frequency Provider Last Rate Last Dose  . 0.9 %  sodium chloride infusion  250 mL Intravenous PRN Duayne Cal, NP 10 mL/hr at 05/15/14 2059 250 mL at 05/15/14 2059  . acetaminophen (TYLENOL) solution 650 mg  650 mg Per Tube Q6H PRN Alyson Reedy, MD   650 mg at 05/09/14 2354  . atorvastatin (LIPITOR) tablet 10 mg  10 mg Oral q1800 Marjan Rabbani, MD   10 mg at 05/17/14 1744  . carvedilol (COREG) tablet 12.5 mg  12.5 mg Oral BID WC Chrystie Nose, MD   12.5 mg at 05/17/14 1744  . divalproex (DEPAKOTE) DR tablet 500 mg  500 mg Oral Q12H Russella Dar, NP   500 mg at 05/17/14 1100  . feeding supplement (ENSURE COMPLETE) (ENSURE COMPLETE) liquid 237 mL  237 mL Oral BID BM Heather Cornelison Pitts, RD   237 mL at 05/17/14 1451  . glipiZIDE (GLUCOTROL) tablet 10 mg  10 mg Oral QAC breakfast Russella Dar, NP   10 mg at 05/17/14 4970   . heparin injection 5,000 Units  5,000 Units Subcutaneous 3 times per day Merwyn Katos, MD   5,000 Units at 05/17/14 1452  . insulin aspart (novoLOG) injection 0-5 Units  0-5 Units Subcutaneous QHS Russella Dar, NP   0 Units at 05/16/14 2143  . insulin aspart (novoLOG) injection 0-9 Units  0-9 Units Subcutaneous TID WC Russella Dar, NP   1 Units at 05/17/14 1247  . isosorbide-hydrALAZINE (BIDIL) 20-37.5 MG per tablet 2 tablet  2 tablet Oral BID Rahul P Desai, PA-C   2 tablet at 05/17/14 1100  . metoprolol (LOPRESSOR) injection 5 mg  5 mg Intravenous Q4H PRN Donato Schultz, MD      . nicotine (NICODERM CQ - dosed in mg/24 hr) patch 7 mg  7 mg Transdermal Daily Alyson Reedy, MD   7 mg at 05/17/14 1100  . ondansetron (ZOFRAN) injection 4 mg  4 mg Intravenous Q6H PRN [name], MD      . sodium chloride 0.9 % injection 10-40 mL  10-40 mL Intracatheter PRN Alyson Reedy, MD      .  vancomycin (VANCOCIN) 500 mg in sodium chloride 0.9 % 100 mL IVPB  500 mg Intravenous Q12H Severiano GilbertFrank Rhea Wilson, RPH   500 mg at 05/17/14 [id number]  . white petrolatum (VASELINE) gel   Topical PRN Alyson ReedyWesam G Yacoub, MD   0.2 application at 05/14/14 1802    No Known Allergies  Review of Systems negative except from HPI and PMH  Physical Exam BP 103/54 mmHg  Pulse 63  Temp(Src) 98.7 F (37.1 C) (Oral)  Resp 20  Ht 6' (1.829 m)  Wt 191 lb (86.637 kg)  BMI 25.90 kg/m2  SpO2 96% Well developed and well nourished in no acute distress HENT normal E scleral and icterus clear Neck Supple JVP flat; carotids brisk and full Clear to ausculation  Regular rate and rhythm, no murmurs gallops or rub Soft with active bowel sounds No clubbing cyanosis none S/p aKA Alert and oriented x 2 , grossly normal motor and sensory function Skin Warm and Dry Recalls events preceding his arrest and [place].     Assessment and  Plan  VF arrest  NICM  LBBB  Renal insufficiency  Grade 3   He is improving  neurologically quite impressively  He should undergo ICD-CRT implant prior to discharge which apparently to skilled nursing is set for Friday and wil need to be delayed  I will reassess mental state in am But spoke today with him and his sister Have reviewed the potential benefits and risks of ICD implantation including but not limited to death, perforation of heart or lung, lead dislodgement, infection,  device malfunction and inappropriate shocks.  The patient and family *express understanding  and are willing to proceed.    Will begin ace I but hold off until after contrast exposure assessed at implant  Will remove central line and place peripheral line

## 2014-05-17 NOTE — Progress Notes (Signed)
Occupational Therapy Treatment Patient Details Name: Jonathon Galvan MRN: [id number] DOB: [birth year] Today's Date: 05/17/2014    History of present illness Pt adm with cardiac arrest and hypothermia protocol initiated. Pt extubated 11/22. PMH - lt AKA, CHF, CAD, Asthma; Allergy; PVD (peripheral vascular disease); Diabetes mellitus; Tobacco abuse; Hepatitis C; Hypertension; LBBB (left bundle branch block); Anemia, Cocaine use   OT comments  Pt apparently having difficulty holding onto his cup per nursing. [facility]. Pt is having difficulty due to poor attention. Recommend eliminating distractions during meals and ADL in order to increase attention to taks. Discussed with family member who verbalized understanding. Nsg aware. Continue to recommend SNF at D/C.   Follow Up Recommendations  SNF;Supervision/Assistance - 24 hour    Equipment Recommendations       Recommendations for Other Services      Precautions / Restrictions Precautions Precautions: Fall                                                            ADL                                         General ADL Comments: Pt with increased ability to participate in ADL Per nursing, pt having difficulty holding onto cup. [facility] and this limitation is caused more by attentional deficits. Educaated pt's sister on need to eliminate distractions during meals and ADL in order to increase attention to tasks. given theratubing for utensils to help with coordination and control of utensils.                                      Cognition   Behavior During Therapy: WFL for tasks assessed/performed Overall Cognitive Status: Impaired/Different from baseline Area of Impairment: Orientation;Attention;Memory;Following commands;Safety/judgement;Awareness;Problem solving Orientation Level: Disoriented to;Situation Current Attention Level:  Sustained Memory: Decreased recall of precautions;Decreased short-term memory  Following Commands: Follows one step commands consistently Safety/Judgement: Decreased awareness of safety;Decreased awareness of deficits Awareness: Intellectual Problem Solving: Slow processing                                General Comments      Pertinent Vitals/ Pain       Pain Assessment: No/denies pain  Home Living                                          Prior Functioning/Environment              Frequency Min 2X/week     Progress Toward Goals  OT Goals(current goals can now be found in the care plan section)  Progress towards OT goals: Progressing toward goals  Acute Rehab OT Goals Patient Stated Goal: Pt didn't state OT Goal Formulation: Patient unable to participate in goal setting Time For Goal Achievement: 05/30/14 Potential to Achieve Goals: Good ADL Goals Pt Will Perform Eating: with min assist;sitting;with adaptive utensils Pt  Will Perform Grooming: with min assist;sitting Pt Will Transfer to Toilet: with +2 assist;with min assist;bedside commode;stand pivot transfer Additional ADL Goal #1: Pt will attend to ADL task x 2 minutes with minimal verbal cues.  Plan Discharge plan remains appropriate    Co-evaluation                 End of Session     Activity Tolerance Patient tolerated treatment well   Patient Left in bed;with call bell/phone within reach;with family/visitor present   Nurse Communication Mobility status        Time: 1425-1439 OT Time Calculation (min): 14 min  Charges: OT General Charges $OT Visit: 1 Procedure OT Treatments $Self Care/Home Management : 8-22 mins  Laterria Lasota,HILLARY 05/17/2014, 2:48 PM   [facility], OTR/L  [phone] 05/17/2014

## 2014-05-18 DIAGNOSIS — R079 Chest pain, unspecified: Secondary | ICD-10-CM

## 2014-05-18 DIAGNOSIS — R0789 Other chest pain: Secondary | ICD-10-CM

## 2014-05-18 DIAGNOSIS — I509 Heart failure, unspecified: Secondary | ICD-10-CM

## 2014-05-18 LAB — CBC
HEMATOCRIT: 34 % — AB (ref 39.0–52.0)
Hemoglobin: 10.8 g/dL — ABNORMAL LOW (ref 13.0–17.0)
MCH: 26 pg (ref 26.0–34.0)
MCHC: 31.8 g/dL (ref 30.0–36.0)
MCV: 81.7 fL (ref 78.0–100.0)
Platelets: 314 10*3/uL (ref 150–400)
RBC: 4.16 MIL/uL — ABNORMAL LOW (ref 4.22–5.81)
RDW: 15 % (ref 11.5–15.5)
WBC: 13.4 10*3/uL — AB (ref 4.0–10.5)

## 2014-05-18 LAB — BASIC METABOLIC PANEL
Anion gap: 13 (ref 5–15)
BUN: 29 mg/dL — ABNORMAL HIGH (ref 6–23)
CO2: 23 meq/L (ref 19–32)
Calcium: 8.1 mg/dL — ABNORMAL LOW (ref 8.4–10.5)
Chloride: 107 mEq/L (ref 96–112)
Creatinine, Ser: 1.82 mg/dL — ABNORMAL HIGH (ref 0.50–1.35)
GFR calc Af Amer: 42 mL/min — ABNORMAL LOW (ref >90)
GFR calc non Af Amer: 36 mL/min — ABNORMAL LOW (ref >90)
GLUCOSE: 86 mg/dL (ref 70–99)
POTASSIUM: 4.3 meq/L (ref 3.7–5.3)
SODIUM: 143 meq/L (ref 137–147)

## 2014-05-18 LAB — GLUCOSE, CAPILLARY
GLUCOSE-CAPILLARY: 146 mg/dL — AB (ref 70–99)
GLUCOSE-CAPILLARY: 153 mg/dL — AB (ref 70–99)
Glucose-Capillary: 94 mg/dL (ref 70–99)
Glucose-Capillary: 160 mg/dL — ABNORMAL HIGH (ref 70–99)
Glucose-Capillary: 167 mg/dL — ABNORMAL HIGH (ref 70–99)

## 2014-05-18 MED ORDER — CHLORHEXIDINE GLUCONATE 4 % EX LIQD
60.0000 mL | Freq: Once | CUTANEOUS | Status: AC
  Administered 2014-05-19: 4 via TOPICAL
  Filled 2014-05-18: qty 60

## 2014-05-18 MED ORDER — CEFAZOLIN SODIUM-DEXTROSE 2-3 GM-% IV SOLR
2.0000 g | INTRAVENOUS | Status: DC
  Filled 2014-05-18: qty 50

## 2014-05-18 MED ORDER — ACETAMINOPHEN 160 MG/5ML PO SOLN: 650.0000 mg | Freq: Four times a day (QID) | ORAL | Status: DC | PRN

## 2014-05-18 MED ORDER — CHLORHEXIDINE GLUCONATE 4 % EX LIQD
60.0000 mL | Freq: Once | CUTANEOUS | Status: AC
  Administered 2014-05-18: 4 via TOPICAL
  Filled 2014-05-18: qty 15
  Filled 2014-05-18: qty 60

## 2014-05-18 MED ORDER — ACETAMINOPHEN 325 MG PO TABS
650.0000 mg | ORAL_TABLET | Freq: Four times a day (QID) | ORAL | Status: DC | PRN
  Administered 2014-05-18: 650 mg via ORAL
  Filled 2014-05-18 (×2): qty 2

## 2014-05-18 MED ORDER — SODIUM CHLORIDE 0.9 % IV SOLN: INTRAVENOUS | Status: DC

## 2014-05-18 MED ORDER — SODIUM CHLORIDE 0.9 % IR SOLN
80.0000 mg | Status: DC
  Filled 2014-05-18: qty 2

## 2014-05-18 MED ORDER — SODIUM CHLORIDE 0.9 % IV SOLN
INTRAVENOUS | Status: DC
  Administered 2014-05-19: 05:00:00 via INTRAVENOUS

## 2014-05-18 MED ORDER — OXYCODONE HCL 5 MG PO TABS
5.0000 mg | ORAL_TABLET | Freq: Four times a day (QID) | ORAL | Status: DC | PRN
  Administered 2014-05-18: 5 mg via ORAL
  Administered 2014-05-18 (×2): 10 mg via ORAL
  Administered 2014-05-19: 5 mg via ORAL
  Administered 2014-05-19: 10 mg via ORAL
  Administered 2014-05-19 (×2): 5 mg via ORAL
  Filled 2014-05-18: qty 1
  Filled 2014-05-18 (×2): qty 2
  Filled 2014-05-18: qty 1
  Filled 2014-05-18: qty 2
  Filled 2014-05-18 (×2): qty 1
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
## 2014-05-18 NOTE — Progress Notes (Signed)
[facility] PROGRESS NOTE  Jonathon BondWilliam D Galvan WUJ:[id number] DOB: 1945-09-13 DOA: 05/04/2014 PCP: Quitman LivingsHASSAN,SAMI, MD   Brief narrative: 68 year old male patient with extensive past medical history including nonischemic cardiomyopathy admitted to the hospital after undergoing a ventricular fibrillation arrest. Upon arrival of EMS the patient was noted to be in ventricular fibrillation he underwent defibrillation and 5 minutes of CPR. After presenting to the emergency department he arrested again for another 60 seconds; emergent pacing begun and patient stabilized.  He was admitted to the ICU where he remained intubated for 10 days. It was suspected patient had associated aspiration pneumonitis in the setting of cardiac arrest. Because of presenting with ventricular fibrillation hypothermia protocol was implemented. Cardiology was consulted. 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 All of echocardiogram on 1114 revealed an EF of 30-35%.  On 11/17 patient developed extreme agitation consistent with acute delirium. Neurology was consulted. CT of the head as well as MRI of the brain were unrevealing. IV Depakote was initiated to treat acute delirium.  He was eventually extubated and transferred to the stepdown unit. He was evaluated by CIR who recommended patient be discharged to skilled nursing facility after ICD placed.  MAJOR EVENTS/TEST RESULTS: 11/12 Admission via ED after VF arrest  11/12 LHC: Nonobstructive CAD. LVEF approx 40-45% 11/12 Hypothermia protocol implemented  11/12 CT head: NAICP 11/12 CT neck: No cervical spine fracture or traumatic subluxation. Advanced multilevel cervical spondylosis is noted 11/14 echo>>>30% to 35%. Dyskinesis of the mid-apicalanteroseptal myocardium. Doppler parameters are consistent with abnormal left ventricular relaxation (grade 1 diastolic dysfunction). 11/16 eeg>>>no seizure, generalized  slowing, c/w hypoxic encephalopathy  11/17: several nurses holding him down. Very agitated. Still w/ copious secretions from ETT. Started on diprivan, precedex stopped. Also started on clonazepam and Seroquel.  11/23 Passed swallow eval 11/24: transferred to medical floor 11/25-: evaluated by EP for ICD 11/26: C/O musculoskeletal chest pain  Assessment/Plan: PRINCIPLE PROBLEM  Cardiac arrest/VF (ventricular fibrillation)/Nonischemic cardiomyopathy -EP and Cards following -EP plan on ICD tomorrow -cont BiDil, hole ACEi -no evidence of CHF decompensation at this time   Acute delirium -Neuro following -was started on Keppra for acute delirium. symptoms much improved. Will ask neuro if dose needs to be tapered to discontinued.   Acute on chronic kidney ds ? Prerenal vs cardiorenal. Has good UOP. Baseline cr of 1.5, slowly improving.  Chest pain on 11/25 Atypical , likely musculoskeletal. Stable on tele. ekg unremarkable.    Essential hypertension -cont BiDil    Acute respiratory failure with hypoxia/MRSA positive post Aspiration pneumonia ? HCAP -stable on Pink oxygen completes vancomycin today   Chronic hepatitis C Normal LFTs . Follow up as outpt   DM2 (diabetes mellitus, type 2) -CBGs stable -continue  Glucotrol -cont SSI   Peripheral vascular disease/Hx of AKA  stablle   Leucocytosis no signs of active infection. follow win am.   DVT prophylaxis: Subcutaneous heparin  Code Status: Full Family Communication:  None at bedside Disposition Plan: to SNF post ICD implant 11/27or 11/28   Consultants: Cardiology EP PCCM Neurology     HPI/Subjective: Patient seen and examined. Denies SOB or chest discomfort. Still drowsy during conversation  Objective: Filed Vitals:   05/18/14 0836  BP:   Pulse: 61  Temp:   Resp:     Intake/Output Summary (Last 24 hours) at 05/18/14 1111 Last data filed at 05/18/14 0550  Gross per 24 hour  Intake    1282 ml  Output  1500 ml  Net   -218 ml   Filed Weights   05/16/14 1141 05/17/14 0400 05/18/14 0500  Weight: 85.095 kg (187 lb 9.6 oz) 86.637 kg (191 lb) 86.138 kg (189 lb 14.4 oz)    Exam:   General: Elderly male in NAD  HEENT: moist oral mucosa  Cardiovascular: NS1&S2, no murmurs  Chest: clear b/l  abd: soft, NT, ND, BS+  Ext: warm, left AKA, no edema  CNS; alert and oriented, improved mental status Data Reviewed: Basic Metabolic Panel:  Recent Labs Lab 05/12/14 0430 05/13/14 0400 05/14/14 0415 05/15/14 0500 05/16/14 0700 05/17/14 0357 05/18/14 0435  NA 152* 149* 135* 149* 147 142 143  K 3.9 4.0 3.9 3.6* 4.1 4.1 4.3  CL 111 106 99 105 107 103 107  CO2 28 29 27 29 28 22 23   GLUCOSE 174* 179* 100* 120* 121* 106* 86  BUN 22 28* 18 33* 35* 35* 29*  CREATININE 1.96* 2.03* 1.30 1.80* 2.01* 1.96* 1.82*  CALCIUM 8.9 8.8 8.2* 9.2 8.8 8.4 8.1*  MG 2.5 2.4 1.8 2.7* 2.9*  --   --   PHOS 5.3* 5.0* 2.9 3.3  --   --   --    Liver Function Tests: No results for input(s): AST, ALT, ALKPHOS, BILITOT, PROT, ALBUMIN in the last 168 hours. No results for input(s): LIPASE, AMYLASE in the last 168 hours.  Recent Labs Lab 05/13/14 0400  AMMONIA 32   CBC:  Recent Labs Lab 05/13/14 0400 05/14/14 0415 05/15/14 0500 05/16/14 0700 05/18/14 0435  WBC 12.3* 13.3* 14.8* 14.7* 13.4*  HGB 11.3* 12.3* 12.9* 11.5* 10.8*  HCT 36.0* 38.2* 40.4 36.2* 34.0*  MCV 83.3 83.6 84.0 82.3 81.7  PLT 213 191 279 280 314   Cardiac Enzymes: No results for input(s): CKTOTAL, CKMB, CKMBINDEX, TROPONINI in the last 168 hours. BNP (last 3 results) No results for input(s): PROBNP in the last 8760 hours. CBG:  Recent Labs Lab 05/17/14 0807 05/17/14 1206 05/17/14 1658 05/17/14 2107 05/18/14 0753  GLUCAP 118* 129* 110* 134* 94    Recent Results (from the past 240 hour(s))  Clostridium Difficile by PCR     Status: None   Collection Time: 05/10/14  1:13 PM  Result Value Ref Range  Status   C difficile by pcr NEGATIVE NEGATIVE Final     Studies: No results found.  Scheduled Meds: . atorvastatin  10 mg Oral q1800  . carvedilol  12.5 mg Oral BID WC  . divalproex  500 mg Oral Q12H  . feeding supplement (ENSURE COMPLETE)  237 mL Oral BID BM  . glipiZIDE  10 mg Oral QAC breakfast  . heparin subcutaneous  5,000 Units Subcutaneous 3 times per day  . insulin aspart  0-5 Units Subcutaneous QHS  . insulin aspart  0-9 Units Subcutaneous TID WC  . isosorbide-hydrALAZINE  2 tablet Oral BID  . nicotine  7 mg Transdermal Daily  . vancomycin  500 mg Intravenous Q12H   Continuous Infusions: . sodium chloride        Time spent: 25 minutes    Belicia Difatta  [facility] Pager [phone] If 7PM-7AM, please contact night-coverage at [email or web address], password [facility] 05/18/2014, 11:11 AM  LOS: 14 days

## 2014-05-18 NOTE — Progress Notes (Signed)
    SUBJECTIVE:  Complaining of chest pain like a "I been lifting weights."   PHYSICAL EXAM Filed Vitals:   05/17/14 1200 05/17/14 2058 05/18/14 0500 05/18/14 0836  BP: 103/54 145/67 120/76   Pulse: 63 57 58 61  Temp: 98.7 F (37.1 C) 99.7 F (37.6 C) 98.9 F (37.2 C)   TempSrc: Oral Oral Oral   Resp: 20 20 20    Height:      Weight:   189 lb 14.4 oz (86.138 kg)   SpO2: 96% 98% 99%    General:  No acute distress Lungs:  Clear Heart:  RRR Abdomen:  Positive bowel sounds, no rebound no guarding Extremities:  No edema   LABS:  Results for orders placed or performed during the hospital encounter of 05/04/14 (from the past 24 hour(s))  Glucose, capillary     Status: Abnormal   Collection Time: 05/17/14 12:06 PM  Result Value Ref Range   Glucose-Capillary 129 (H) 70 - 99 mg/dL  Glucose, capillary     Status: Abnormal   Collection Time: 05/17/14  4:58 PM  Result Value Ref Range   Glucose-Capillary 110 (H) 70 - 99 mg/dL  Glucose, capillary     Status: Abnormal   Collection Time: 05/17/14  9:07 PM  Result Value Ref Range   Glucose-Capillary 134 (H) 70 - 99 mg/dL  Basic metabolic panel     Status: Abnormal   Collection Time: 05/18/14  4:35 AM  Result Value Ref Range   Sodium 143 137 - 147 mEq/L   Potassium 4.3 3.7 - 5.3 mEq/L   Chloride 107 96 - 112 mEq/L   CO2 23 19 - 32 mEq/L   Glucose, Bld 86 70 - 99 mg/dL   BUN 29 (H) 6 - 23 mg/dL   Creatinine, Ser 9.35 (H) 0.50 - 1.35 mg/dL   Calcium 8.1 (L) 8.4 - 10.5 mg/dL   GFR calc non Af Amer 36 (L) >90 mL/min   GFR calc Af Amer 42 (L) >90 mL/min   Anion gap 13 5 - 15  CBC     Status: Abnormal   Collection Time: 05/18/14  4:35 AM  Result Value Ref Range   WBC 13.4 (H) 4.0 - 10.5 K/uL   RBC 4.16 (L) 4.22 - 5.81 MIL/uL   Hemoglobin 10.8 (L) 13.0 - 17.0 g/dL   HCT 70.1 (L) 77.9 - 39.0 %   MCV 81.7 78.0 - 100.0 fL   MCH 26.0 26.0 - 34.0 pg   MCHC 31.8 30.0 - 36.0 g/dL   RDW 30.0 92.3 - 30.0 %   Platelets 314 150 - 400 K/uL   Glucose, capillary     Status: None   Collection Time: 05/18/14  7:53 AM  Result Value Ref Range   Glucose-Capillary 94 70 - 99 mg/dL    Intake/Output Summary (Last 24 hours) at 05/18/14 0841 Last data filed at 05/18/14 0550  Gross per 24 hour  Intake   1522 ml  Output   1500 ml  Net     22 ml    ASSESSMENT AND PLAN:  VFIB ARREST:  Dr. Graciela Husbands has seen the patient and is considering him for ICD.   NPO after MN.  Mental status has cleared significantly.    CHF:  Holding on ACE inhibitor pending contrast required for ICD.  Seems to be euvolemic.  Currently on BiDil  CHEST PAIN:  Musculoskeletal.        Rollene Rotunda 05/18/2014 8:41 AM

## 2014-05-18 NOTE — Progress Notes (Signed)
Tried to start piv uncooperative,refused for 2nd attempt,prim.RN aware.Almeta Monas RN  Iv team

## 2014-05-19 ENCOUNTER — Encounter (HOSPITAL_COMMUNITY)
Admission: EM | Disposition: A | Discharge status: Skilled Nursing Facility | Payer: PRIVATE HEALTH INSURANCE | Source: Home / Self Care | Attending: Pulmonary Disease

## 2014-05-19 ENCOUNTER — Encounter (HOSPITAL_COMMUNITY): Payer: Self-pay | Admitting: Cardiology

## 2014-05-19 DIAGNOSIS — I429 Cardiomyopathy, unspecified: Secondary | ICD-10-CM

## 2014-05-19 DIAGNOSIS — I5022 Chronic systolic (congestive) heart failure: Secondary | ICD-10-CM

## 2014-05-19 DIAGNOSIS — I462 Cardiac arrest due to underlying cardiac condition: Secondary | ICD-10-CM

## 2014-05-19 HISTORY — PX: OTHER SURGICAL HISTORY: SHX169

## 2014-05-19 HISTORY — PX: BI-VENTRICULAR IMPLANTABLE CARDIOVERTER DEFIBRILLATOR: SHX5459

## 2014-05-19 LAB — GLUCOSE, CAPILLARY
GLUCOSE-CAPILLARY: 87 mg/dL (ref 70–99)
GLUCOSE-CAPILLARY: 91 mg/dL (ref 70–99)
GLUCOSE-CAPILLARY: 121 mg/dL — AB (ref 70–99)
Glucose-Capillary: 108 mg/dL — ABNORMAL HIGH (ref 70–99)

## 2014-05-19 LAB — BASIC METABOLIC PANEL
ANION GAP: 13 (ref 5–15)
BUN: 25 mg/dL — ABNORMAL HIGH (ref 6–23)
CO2: 21 mEq/L (ref 19–32)
Calcium: 8.4 mg/dL (ref 8.4–10.5)
Chloride: 107 mEq/L (ref 96–112)
Creatinine, Ser: 1.84 mg/dL — ABNORMAL HIGH (ref 0.50–1.35)
GFR, EST AFRICAN AMERICAN: 41 mL/min — AB (ref >90)
GFR, EST NON AFRICAN AMERICAN: 35 mL/min — AB (ref >90)
Glucose, Bld: 85 mg/dL (ref 70–99)
POTASSIUM: 4.7 meq/L (ref 3.7–5.3)
Sodium: 141 mEq/L (ref 137–147)

## 2014-05-19 LAB — CBC
HCT: 35 % — ABNORMAL LOW (ref 39.0–52.0)
Hemoglobin: 11.2 g/dL — ABNORMAL LOW (ref 13.0–17.0)
MCH: 26.3 pg (ref 26.0–34.0)
MCHC: 32 g/dL (ref 30.0–36.0)
MCV: 82.2 fL (ref 78.0–100.0)
Platelets: 340 10*3/uL (ref 150–400)
RBC: 4.26 MIL/uL (ref 4.22–5.81)
RDW: 15 % (ref 11.5–15.5)
WBC: 14.8 10*3/uL — AB (ref 4.0–10.5)

## 2014-05-19 SURGERY — BI-VENTRICULAR IMPLANTABLE CARDIOVERTER DEFIBRILLATOR  (CRT-D)
Anesthesia: LOCAL

## 2014-05-19 SURGERY — Surgical Case
Anesthesia: *Unknown

## 2014-05-19 MED ORDER — CEFAZOLIN SODIUM 1-5 GM-% IV SOLN
1.0000 g | Freq: Four times a day (QID) | INTRAVENOUS | Status: AC
  Administered 2014-05-19 – 2014-05-20 (×3): 1 g via INTRAVENOUS
  Filled 2014-05-19 (×3): qty 50

## 2014-05-19 MED ORDER — ACETAMINOPHEN 325 MG PO TABS
325.0000 mg | ORAL_TABLET | ORAL | Status: DC | PRN
  Administered 2014-05-20: 650 mg via ORAL
  Filled 2014-05-19 (×2): qty 2

## 2014-05-19 MED ORDER — LIDOCAINE HCL (PF) 1 % IJ SOLN
INTRAMUSCULAR | Status: AC
  Filled 2014-05-19: qty 30

## 2014-05-19 MED ORDER — ALPRAZOLAM 0.5 MG PO TABS
0.5000 mg | ORAL_TABLET | Freq: Three times a day (TID) | ORAL | Status: DC | PRN
  Administered 2014-05-19: 0.5 mg via ORAL
  Filled 2014-05-19: qty 1

## 2014-05-19 MED ORDER — MIDAZOLAM HCL 5 MG/5ML IJ SOLN
INTRAMUSCULAR | Status: AC
  Filled 2014-05-19: qty 5

## 2014-05-19 MED ORDER — SODIUM CHLORIDE 0.9 % IV SOLN
INTRAVENOUS | Status: AC
  Administered 2014-05-19: 11:00:00 via INTRAVENOUS

## 2014-05-19 MED ORDER — ONDANSETRON HCL 4 MG/2ML IJ SOLN: 4.0000 mg | Freq: Four times a day (QID) | INTRAMUSCULAR | Status: DC | PRN

## 2014-05-19 MED ORDER — FENTANYL CITRATE 0.05 MG/ML IJ SOLN
INTRAMUSCULAR | Status: AC
  Filled 2014-05-19: qty 2

## 2014-05-19 MED ORDER — HEPARIN (PORCINE) IN NACL 2-0.9 UNIT/ML-% IJ SOLN
INTRAMUSCULAR | Status: AC
  Filled 2014-05-19: qty 500

## 2014-05-19 NOTE — CV Procedure (Signed)
Jonathon Galvan [id number]  [id number]  Preop Dx: cardiac arrest LBBB Nonischemic cardiomyopathy CHF Postop Dx same/     Procedure: ICD with LV lead placement and HV lead assessment  Cx: None   EBL: Minimal    Dictation number [id number]  Sherryl Manges, MD 05/19/2014 10:03 AM

## 2014-05-19 NOTE — Progress Notes (Signed)
Pt having trouble following precautions with the Left arm. Pt removed sling. Pt educated to keep arm on chest. Dressing in place, clean, dry, intact. Will continue to monitor. Cecille Rubin, RN

## 2014-05-19 NOTE — Progress Notes (Signed)
CBG 87 pre procedure in cath holding. Awake, oriented without complaints.In SR at rate of 60 with 1st degree AV block.

## 2014-05-19 NOTE — Progress Notes (Signed)
PT Cancellation Note  Patient Details Name: Jonathon Galvan MRN: [id number] DOB: 1946-05-25   Cancelled Treatment:    Reason Eval/Treat Not Completed: Patient at procedure or test/unavailable.  Patient having ICD placed this am.  Will return tomorrow for PT session.   Vena Austria 05/19/2014, 1:22 PM Durenda Hurt Renaldo Fiddler, [facility] Acute Rehab Services Pager [phone]

## 2014-05-19 NOTE — Op Note (Signed)
NAME:  Jonathon Galvan, Jonathon Galvan NO.:  [id number]  MEDICAL RECORD NO.:  [id number]  LOCATION:  [id number]                        FACILITY:  [facility]  PHYSICIAN:  Duke Salvia, MD, FACCDATE OF BIRTH:  1945/09/25  DATE OF PROCEDURE:  05/19/2014 DATE OF DISCHARGE:                              OPERATIVE REPORT   PREOPERATIVE DIAGNOSIS:  POSTOPERATIVE DIAGNOSIS:  PROCEDURE:  Defibrillator implantation with left ventricular lead placement and high-voltage lead assessment.  PROCEDURE IN DETAIL:  Following obtaining informed consent, the patient was brought to Electrophysiology Laboratory and placed on the fluoroscopic table in supine position.  After routine prep and drape of the left upper chest, lidocaine was infiltrated in prepectoral subclavicular region.  Incision was made and carried down to layer of the prepectoral fascia with electrocautery and sharp dissection.  A pocket was formed similarly.  Hemostasis was obtained.  Thereafter, attention was turned to gain access to the extrathoracic left subclavian vein which was accomplished without difficulty without the aspiration via puncture of the artery.  Three separate venipunctures were accomplished.  Guidewires were placed and retained sequentially. An 8-French, 9.5, and 7-French sheaths were placed through which were placed a Medtronic [id number] cm ventricular lead serial number [id number], a Medtronic MB2 coronary sinus cannulation catheter, and a Medtronic 5076 atrial fixation active lead serial number [id number].  Under fluoroscopic guidance, the RV lead was manipulated to the apex with bipolar R-wave with 6.1 ohm with a pace impedance of 629, a threshold of 0.9 V at 0.5 milliseconds.  Current of threshold was 1.5 mA, and there was no diaphragmatic pacing at 10 V, and this lead was secured to the prepectoral fascia.  We then gained access to the coronary sinus, and with 3 mL, obtained a venogram demonstrating a low  lateral branch, a mid lateral branch, and a high lateral branch.  The low lateral branch was targeted initially, but after deployment of the lead, we noticed it was too apical.  We then went to get into the midlateral branch which was turned out to be more difficult because of a shepherd's crook and a posterior takeoff; and we went to the higher lateral branch.  I was able to deploy the lead at the junction between the proximal and the mid third where the bipolar LV tip to RV coil.  The amplitude was 5.9 with a pace impedance of 995, a threshold of 0.3 V at 0.5 milliseconds.  There was no diaphragmatic pacing at 10 V.  I should note that this lead was a Medtronic [id number] cm lead, serial number [id number].  The 9.5-French sheath was removed and then the previously noted atrial lead was deployed.  Under fluoroscopic guidance, we manipulated the right atrial appendage where the bipolar P- wave was 2 with a pace impedance of 649, a threshold of 0.8 V at 0.5 milliseconds.  Current of threshold was 1.2 mA.  There was no diaphragmatic pacing at 10 V.  The current of injury was brisk.  This lead was secured to the prepectoral fascia.  We then removed the coronary sinus deployment system and secured the lead.  Leads were then attached to a Medtronic __________ Quattro ICD, serial number [id number].  Through the device, the bipolar P-wave was 1.8 with a pace impedance of 418, a threshold of 0.75 at 0.4 milliseconds.  The R- wave was 7.1 with a pace impedance of 532 with threshold of 0.5 V at 0.4 milliseconds; and the LV impedance was 342, and the threshold was 0.75 at 0.4 millisecond with the LV tip to RV coil configuration.  High- voltage impedance was 63 ohms.  Fluoroscopic assessment of the lead was then remained stable throughout the duration of the procedure, although the fact that it was deployed with some leads still in the body of the coronary sinus.  The pocket was copiously irrigated  with antibiotic containing saline solution.  Hemostasis was assured and leads in the pulse generator were placed in the pocket secured to the prepectoral fascia.  The wound was closed in 2 layers in normal fashion.  The wound was washed, dried, and a Dermabond dressing was applied.  Needle counts, sponge counts, and instrument counts were correct at the end of the procedure according to the staff.  The patient tolerated the procedure without apparent complication.     Duke SalviaSteven C. Klein, MD, [facility] NewbergFACC     SCK/MEDQ  D:  05/19/2014  T:  05/19/2014  Job:  [id number]

## 2014-05-19 NOTE — Progress Notes (Signed)
Pt educated and advised to keep arm in sling, and close to his body. Pt is out of sling and using arm. Cecille Rubin, RN

## 2014-05-19 NOTE — Plan of Care (Signed)
Problem: Phase III Progression Outcomes Goal: OOB with assistance Outcome: Not Progressing

## 2014-05-19 NOTE — Discharge Instructions (Signed)
° ° °  Supplemental Discharge Instructions for  Pacemaker/Defibrillator Patients  Activity No heavy lifting or vigorous activity with your left arm for 6 to 8 weeks.  Do not raise your left arm above your head for one week.  Gradually raise your affected arm as drawn below.           __11/28/15                      05/22/14                     05/24/14                   05/26/14  NO DRIVING for 6 months    ; you may begin driving on 0/12/22    .  WOUND CARE - Keep the wound area clean and dry.  Do not get this area wet for one week. No showers for one week; you may shower on  05/25/14   . - The tape/steri-strips on your wound will fall off; do not pull them off.  No bandage is needed on the site.  DO  NOT apply any creams, oils, or ointments to the wound area. - If you notice any drainage or discharge from the wound, any swelling or bruising at the site, or you develop a fever > 101? F after you are discharged home, call the office at once.  Special Instructions - You are still able to use cellular telephones; use the ear opposite the side where you have your pacemaker/defibrillator.  Avoid carrying your cellular phone near your device. - When traveling through airports, show security personnel your identification card to avoid being screened in the metal detectors.  Ask the security personnel to use the hand wand. - Avoid arc welding equipment, MRI testing (magnetic resonance imaging), TENS units (transcutaneous nerve stimulators).  Call the office for questions about other devices. - Avoid electrical appliances that are in poor condition or are not properly grounded. - Microwave ovens are safe to be near or to operate.  Additional information for defibrillator patients should your device go off: - If your device goes off ONCE and you feel fine afterward, notify the device clinic nurses. - If your device goes off ONCE and you do not feel well afterward, call 911. - If your device goes off  TWICE, call 911. - If your device goes off THREE times in one day, call 911.  DO NOT DRIVE YOURSELF OR A FAMILY MEMBER WITH A DEFIBRILLATOR TO THE HOSPITAL--CALL 911.  Weigh daily Call [phone] if weight climbs more than 3 pounds in a day or 5 pounds in a week. No salt to very little salt in your diet.  No more than 2000 mg in a day. Call if increased shortness of breath or increased swelling.  Heart Healthy very low salt diet.
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
## 2014-05-19 NOTE — Interval H&P Note (Signed)
ICD Criteria  Current LVEF:35% ;Obtained < 1 month ago.  NYHA Functional Classification: Class II  Heart Failure History:  Yes, Duration of heart failure since onset is 3 to 9 months  Non-Ischemic Dilated Cardiomyopathy History:  Yes, timeframe is < 3 months  Atrial Fibrillation/Atrial Flutter:  No.  Ventricular Tachycardia History:  Yes, Hemodynamic instability present, VT Type:  SVT - Polymorphic.  Cardiac Arrest History:  Yes, This was a Ventricular Tachycardia/Ventricular Fibrillation Arrest. This was NOT a bradycardia arrest.  History of Syndromes with Risk of Sudden Death:  No.  Previous ICD:  No.  Electrophysiology Study: No.  Prior MI: No.  PPM: No.  OSA:  No  Patient Life Expectancy of >=1 year: Yes.  Anticoagulation Therapy:  Patient is on anticoagulation therapy, anticoagulation was NOT held prior to procedure.   Beta Blocker Therapy:  Yes.   Ace Inhibitor/ARB Therapy:  No, Reason not on Ace Inhibitor/ARB therapy:  renal insuffciencyHistory and Physical Interval Note:  05/19/2014 8:10 AM  Juanda Bond  has presented today for surgery, with the diagnosis of chf  The various methods of treatment have been discussed with the patient and family. After consideration of risks, benefits and other options for treatment, the patient has consented to  Procedure(s): BI-VENTRICULAR IMPLANTABLE CARDIOVERTER DEFIBRILLATOR  (CRT-D) (N/A) as a surgical intervention .  The patient's history has been reviewed, patient examined, no change in status, stable for surgery.  I have reviewed the patient's chart and labs.  Questions were answered to the patient's satisfaction.     Sherryl Manges

## 2014-05-19 NOTE — Plan of Care (Signed)
Problem: Phase III Progression Outcomes Goal: Tolerating diet Outcome: Progressing     

## 2014-05-19 NOTE — Plan of Care (Signed)
Problem: Phase III Progression Outcomes Goal: Discharge plan established Outcome: Completed/Met Date Met:  05/19/14

## 2014-05-19 NOTE — Clinical Social Work Note (Signed)
Clinical Social Worker continuing to follow patient and family for support and discharge planning needs.  CSW spoke with patient at bedside who states that him and his family have chosen [facility] at discharge.  CSW contacted facility to provide update and potential discharge of tomorrow.  CSW contacted patient sister who is in agreement and understanding of patient plans at discharge.  CSW remains available for support and to facilitate patient discharge needs once medically ready.  Macario Golds, [place] [phone]

## 2014-05-19 NOTE — Progress Notes (Signed)
[facility] PROGRESS NOTE  Jonathon BondWilliam D Noller ZOX:[id number] DOB: 05/25/1946 DOA: 05/04/2014 PCP: Quitman LivingsHASSAN,SAMI, MD  Brief narrative: 68 year old male patient with extensive past medical history including nonischemic cardiomyopathy admitted to the hospital after undergoing a ventricular fibrillation arrest. Upon arrival of EMS the patient was noted to be in ventricular fibrillation he underwent defibrillation and 5 minutes of CPR. After presenting to the emergency department he arrested again for another 60 seconds; emergent pacing begun and patient stabilized.  He was admitted to the ICU where he remained intubated for 10 days. It was suspected patient had associated aspiration pneumonitis in the setting of cardiac arrest. Because of presenting with ventricular fibrillation hypothermia protocol was implemented. Cardiology was consulted. 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 All of echocardiogram on 1114 revealed an EF of 30-35%.  On 11/17 patient developed extreme agitation consistent with acute delirium. Neurology was consulted. CT of the head as well as MRI of the brain were unrevealing. IV Depakote was initiated to treat acute delirium.  He was eventually extubated and transferred to the stepdown unit. He was evaluated by CIR who recommended patient be discharged to skilled nursing facility after ICD placed.  MAJOR EVENTS/TEST RESULTS: 11/12 Admission via ED after VF arrest  11/12 LHC: Nonobstructive CAD. LVEF approx 40-45% 11/12 Hypothermia protocol implemented  11/12 CT head: NAICP 11/12 CT neck: No cervical spine fracture or traumatic subluxation. Advanced multilevel cervical spondylosis is noted 11/14 echo>>>30% to 35%. Dyskinesis of the mid-apicalanteroseptal myocardium. Doppler parameters are consistent with abnormal left ventricular relaxation (grade 1 diastolic dysfunction). 11/16 eeg>>>no seizure, generalized slowing,  c/w hypoxic encephalopathy  11/17: several nurses holding him down. Very agitated. Still w/ copious secretions from ETT. Started on diprivan, precedex stopped. Also started on clonazepam and Seroquel.  11/23 Passed swallow eval 11/24: transferred to medical floor 11/25-: evaluated by EP for ICD 11/26: C/O musculoskeletal chest pain 11/27: ICD placed  Assessment/Plan: PRINCIPLE PROBLEM  Cardiac arrest/VF (ventricular fibrillation)/Nonischemic cardiomyopathy -ICD placed today. Tolerated well -cont BiDil, hole ACEi -no evidence of CHF decompensation at this time.   Acute delirium -Possibly following cardiac arrest. Started on Depakote twice a day. Mental status progressively improved. D/w neuro today. Will discontinue depakote.   Acute on chronic kidney ds ? Prerenal vs cardiorenal. Has good UOP. Baseline cr of 1.5, slowly improving.  Chest pain on 11/25 Atypical , likely musculoskeletal. Stable on tele. ekg unremarkable. No further symptoms    Essential hypertension -cont BiDil    Acute respiratory failure with hypoxia/MRSA positive post Aspiration pneumonia ? HCAP -stable on   Completed IV vancomycin on 11/26. Still has some leucocytosis. No active infection noted.   Chronic hepatitis C Normal LFTs . Follow up as outpt   DM2 (diabetes mellitus, type 2) -stable. continue glocotrol and SSI   Peripheral vascular disease/Hx of AKA  stablle   Leucocytosis no signs of active infection.   DVT prophylaxis: Subcutaneous heparin  Code Status: Full Family Communication: None at bedside Disposition Plan: to SNF possibly tomorrow   Consultants: Cardiology EP PCCM Neurology     HPI/Subjective: Patient seen and examined. Denies SOB or chest discomfort. Still drowsy during conversation  Objective: Filed Vitals:   05/19/14 1046  BP: 130/63  Pulse: 62  Temp: 99.1 F (37.3 C)  Resp: 23    Intake/Output Summary (Last 24 hours) at 05/19/14  1122 Last data filed at 05/18/14 2215  Gross per 24 hour  Intake    240 ml  Output    626 ml  Net   -386 ml   Filed Weights   05/17/14 0400 05/18/14 0500 05/19/14 0419  Weight: 86.637 kg (191 lb) 86.138 kg (189 lb 14.4 oz) 86.093 kg (189 lb 12.8 oz)    Exam:   General:  Elderly male in no acute distress, appears sleepy during conversation  HEENT: Moist oral mucosa  Chest: Clear to auscultation bilaterally, dressing over ICD  Cardiovascular: Normal S1 and S2, no murmurs  Abdomen: Soft, nondistended, nontender  EXT: Warm, left AKA, no edema  CNS: awake and  alert, slow communication   Data Reviewed: Basic Metabolic Panel:  Recent Labs Lab 05/13/14 0400 05/14/14 0415 05/15/14 0500 05/16/14 0700 05/17/14 0357 05/18/14 0435 05/19/14 0221  NA 149* 135* 149* 147 142 143 141  K 4.0 3.9 3.6* 4.1 4.1 4.3 4.7  CL 106 99 105 107 103 107 107  CO2 29 27 29 28 22 23 21   GLUCOSE 179* 100* 120* 121* 106* 86 85  BUN 28* 18 33* 35* 35* 29* 25*  CREATININE 2.03* 1.30 1.80* 2.01* 1.96* 1.82* 1.84*  CALCIUM 8.8 8.2* 9.2 8.8 8.4 8.1* 8.4  MG 2.4 1.8 2.7* 2.9*  --   --   --   PHOS 5.0* 2.9 3.3  --   --   --   --    Liver Function Tests: No results for input(s): AST, ALT, ALKPHOS, BILITOT, PROT, ALBUMIN in the last 168 hours. No results for input(s): LIPASE, AMYLASE in the last 168 hours.  Recent Labs Lab 05/13/14 0400  AMMONIA 32   CBC:  Recent Labs Lab 05/14/14 0415 05/15/14 0500 05/16/14 0700 05/18/14 0435 05/19/14 0221  WBC 13.3* 14.8* 14.7* 13.4* 14.8*  HGB 12.3* 12.9* 11.5* 10.8* 11.2*  HCT 38.2* 40.4 36.2* 34.0* 35.0*  MCV 83.6 84.0 82.3 81.7 82.2  PLT 191 279 280 314 340   Cardiac Enzymes: No results for input(s): CKTOTAL, CKMB, CKMBINDEX, TROPONINI in the last 168 hours. BNP (last 3 results) No results for input(s): PROBNP in the last 8760 hours. CBG:  Recent Labs Lab 05/18/14 1206 05/18/14 1636 05/18/14 2207 05/18/14 2246 05/19/14 0743  GLUCAP  160* 146* 167* 153* 87    Recent Results (from the past 240 hour(s))  Clostridium Difficile by PCR     Status: None   Collection Time: 05/10/14  1:13 PM  Result Value Ref Range Status   C difficile by pcr NEGATIVE NEGATIVE Final     Studies: No results found.  Scheduled Meds: . atorvastatin  10 mg Oral q1800  . carvedilol  12.5 mg Oral BID WC  .  ceFAZolin (ANCEF) IV  1 g Intravenous Q6H  . feeding supplement (ENSURE COMPLETE)  237 mL Oral BID BM  . glipiZIDE  10 mg Oral QAC breakfast  . heparin subcutaneous  5,000 Units Subcutaneous 3 times per day  . insulin aspart  0-5 Units Subcutaneous QHS  . insulin aspart  0-9 Units Subcutaneous TID WC  . isosorbide-hydrALAZINE  2 tablet Oral BID  . nicotine  7 mg Transdermal Daily   Continuous Infusions: . sodium chloride    . sodium chloride 50 mL/hr at 05/19/14 1043       Time spent: 25 MINUTES    Eddie North  [facility] Pager [phone]. If 7PM-7AM, please contact night-coverage at [email or web address], password [facility] 05/19/2014, 11:22 AM  LOS: 15 days

## 2014-05-19 NOTE — Progress Notes (Signed)
Pt had unwitnessed fall at 22:22. Bed alarm had gone off and both NT and RN came to room.  Pt was found sitting on floor. Pt stated that reached for his call bell and fell on his hands. Pt denied any pain/ soreness from fall and that he had overestimated how far down his call bell fell. Pt also denied hitting head. Pt alert before and after falling. MD was notified. Vitals were normal and pt had baseline confusion about date.

## 2014-05-20 ENCOUNTER — Inpatient Hospital Stay (HOSPITAL_COMMUNITY): Payer: PRIVATE HEALTH INSURANCE

## 2014-05-20 DIAGNOSIS — Z95810 Presence of automatic (implantable) cardiac defibrillator: Secondary | ICD-10-CM | POA: Diagnosis present

## 2014-05-20 DIAGNOSIS — R5081 Fever presenting with conditions classified elsewhere: Secondary | ICD-10-CM

## 2014-05-20 LAB — URINALYSIS, ROUTINE W REFLEX MICROSCOPIC
BILIRUBIN URINE: NEGATIVE
Glucose, UA: NEGATIVE mg/dL
HGB URINE DIPSTICK: NEGATIVE
Ketones, ur: NEGATIVE mg/dL
Leukocytes, UA: NEGATIVE
Nitrite: NEGATIVE
PROTEIN: 30 mg/dL — AB
SPECIFIC GRAVITY, URINE: 1.013 (ref 1.005–1.030)
Urobilinogen, UA: 0.2 mg/dL (ref 0.0–1.0)
pH: 6 (ref 5.0–8.0)

## 2014-05-20 LAB — CBC
HCT: 36.1 % — ABNORMAL LOW (ref 39.0–52.0)
Hemoglobin: 11.7 g/dL — ABNORMAL LOW (ref 13.0–17.0)
MCH: 26.5 pg (ref 26.0–34.0)
MCHC: 32.4 g/dL (ref 30.0–36.0)
MCV: 81.9 fL (ref 78.0–100.0)
PLATELETS: 287 10*3/uL (ref 150–400)
RBC: 4.41 MIL/uL (ref 4.22–5.81)
RDW: 15.1 % (ref 11.5–15.5)
WBC: 15.3 10*3/uL — ABNORMAL HIGH (ref 4.0–10.5)

## 2014-05-20 LAB — BASIC METABOLIC PANEL
Anion gap: 15 (ref 5–15)
BUN: 22 mg/dL (ref 6–23)
CHLORIDE: 106 meq/L (ref 96–112)
CO2: 21 meq/L (ref 19–32)
CREATININE: 1.87 mg/dL — AB (ref 0.50–1.35)
Calcium: 8.4 mg/dL (ref 8.4–10.5)
GFR calc non Af Amer: 35 mL/min — ABNORMAL LOW (ref >90)
GFR, EST AFRICAN AMERICAN: 40 mL/min — AB (ref >90)
Glucose, Bld: 120 mg/dL — ABNORMAL HIGH (ref 70–99)
POTASSIUM: 4.8 meq/L (ref 3.7–5.3)
SODIUM: 142 meq/L (ref 137–147)

## 2014-05-20 LAB — GLUCOSE, CAPILLARY
GLUCOSE-CAPILLARY: 229 mg/dL — AB (ref 70–99)
Glucose-Capillary: 95 mg/dL (ref 70–99)
Glucose-Capillary: 184 mg/dL — ABNORMAL HIGH (ref 70–99)
Glucose-Capillary: 102 mg/dL — ABNORMAL HIGH (ref 70–99)

## 2014-05-20 LAB — URINE MICROSCOPIC-ADD ON

## 2014-05-20 MED ORDER — ALPRAZOLAM 0.25 MG PO TABS
0.2500 mg | ORAL_TABLET | Freq: Two times a day (BID) | ORAL | Status: DC | PRN
Start: 2014-05-20 — End: 2014-05-20

## 2014-05-20 MED ORDER — MORPHINE SULFATE 2 MG/ML IJ SOLN
2.0000 mg | Freq: Once | INTRAMUSCULAR | Status: AC
  Administered 2014-05-20: 2 mg via INTRAVENOUS
  Filled 2014-05-20: qty 1

## 2014-05-20 MED ORDER — LEVOFLOXACIN 750 MG PO TABS
750.0000 mg | ORAL_TABLET | ORAL | Status: DC
  Administered 2014-05-20: 750 mg via ORAL
  Filled 2014-05-20: qty 1

## 2014-05-20 MED ORDER — TRAMADOL-ACETAMINOPHEN 37.5-325 MG PO TABS
1.0000 | ORAL_TABLET | Freq: Four times a day (QID) | ORAL | Status: DC | PRN
  Administered 2014-05-20 – 2014-05-21 (×2): 1 via ORAL
  Filled 2014-05-20 (×2): qty 1

## 2014-05-20 MED ORDER — FUROSEMIDE 10 MG/ML IJ SOLN
40.0000 mg | Freq: Once | INTRAMUSCULAR | Status: AC
  Administered 2014-05-20: 40 mg via INTRAVENOUS
  Filled 2014-05-20: qty 4

## 2014-05-20 MED ORDER — FUROSEMIDE 40 MG PO TABS
40.0000 mg | ORAL_TABLET | Freq: Two times a day (BID) | ORAL | Status: DC
  Administered 2014-05-20 – 2014-05-21 (×2): 40 mg via ORAL
  Filled 2014-05-20 (×2): qty 1

## 2014-05-20 NOTE — Progress Notes (Signed)
Dr. Conley Rolls notified of 3 nurses attempting to obtain IV site and IV team only able to obtain site in left wrist. He verified that site was ok to have and use even though in same side as pacemaker. [facility] [employer]

## 2014-05-20 NOTE — Plan of Care (Signed)
Problem: Acute Rehab PT Goals(only PT should resolve) Goal: Pt Will Go Supine/Side To Sit Outcome: Completed/Met Date Met:  05/20/14 Goal: Patient Will Perform Sitting Balance Outcome: Completed/Met Date Met:  05/20/14 Goal: Pt Will Transfer Bed To Chair/Chair To Bed Outcome: Completed/Met Date Met:  05/20/14

## 2014-05-20 NOTE — Plan of Care (Signed)
Problem: Phase II Progression Outcomes Goal: Extubated and maintains O2 sats > 92% Outcome: Completed/Met Date Met:  05/20/14  Problem: Phase III Progression Outcomes Goal: Mental status at or near baseline Outcome: Completed/Met Date Met:  05/20/14 Goal: OOB with assistance Outcome: Progressing Goal: Tolerating diet Outcome: Completed/Met Date Met:  05/20/14

## 2014-05-20 NOTE — Progress Notes (Signed)
[facility] PROGRESS NOTE  Juanda BondWilliam D Prill XLK:[id number] DOB: [birth year] DOA: 05/04/2014 PCP: Quitman LivingsHASSAN,SAMI, MD  Brief narrative: 68 year old male patient with extensive past medical history including nonischemic cardiomyopathy admitted to the hospital after undergoing a ventricular fibrillation arrest. Upon arrival of EMS the patient was noted to be in ventricular fibrillation he underwent defibrillation and 5 minutes of CPR. After presenting to the emergency department he arrested again for another 60 seconds; emergent pacing begun and patient stabilized.  He was admitted to the ICU where he remained intubated for 10 days. It was suspected patient had associated aspiration pneumonitis in the setting of cardiac arrest. Because of presenting with ventricular fibrillation hypothermia protocol was implemented. Cardiology was consulted. 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 All of echocardiogram on 1114 revealed an EF of 30-35%.  On 11/17 patient developed extreme agitation consistent with acute delirium. Neurology was consulted. CT of the head as well as MRI of the brain were unrevealing. IV Depakote was initiated to treat acute delirium.  He was eventually extubated and transferred to the stepdown unit. He was evaluated by CIR who recommended patient be discharged to skilled nursing facility after ICD placed.  MAJOR EVENTS/TEST RESULTS: 11/12 Admission via ED after VF arrest  11/12 LHC: Nonobstructive CAD. LVEF approx 40-45% 11/12 Hypothermia protocol implemented  11/12 CT head: NAICP 11/12 CT neck: No cervical spine fracture or traumatic subluxation. Advanced multilevel cervical spondylosis is noted 11/14 echo>>>30% to 35%. Dyskinesis of the mid-apicalanteroseptal myocardium. Doppler parameters are consistent with abnormal left ventricular relaxation (grade 1 diastolic dysfunction). 11/16 eeg>>>no seizure, generalized slowing,  c/w hypoxic encephalopathy  11/17: several nurses holding him down. Very agitated. Still w/ copious secretions from ETT. Started on diprivan, precedex stopped. Also started on clonazepam and Seroquel.  11/23 Passed swallow eval 11/24: transferred to medical floor 11/25-: evaluated by EP for ICD 11/26: C/O musculoskeletal chest pain 11/27: ICD placed  Assessment/Plan: Principal problem  Cardiac arrest/VF (ventricular fibrillation)/ Nonischemic cardiomyopathy -ICD placed on 11/27. Tolerated well -cont BiDil, hole ACEi -no evidence of CHF decompensation at this time.  Fever with leucocytosis on 11/27 Patient spiking temp overnight , febrile this am. Check cbc. UA and CXR negative for infection. ordered blood cx. starded on empiric Levaquin.  Completed 2 weeks course of vancomycin on 11/26  Persistent delirium -Possibly following cardiac arrest. Patient  was  started on Depakote which I discontinued 11/27 given improvement in mental status. Patient more drowsy and confused today. He has been on Percocet for pain and when necessary Xanax. I have discontinued both. He also received one dose of morphine overnight. Will monitor clinically.  Acute on chronic kidney ds ? Prerenal vs cardiorenal. Has good UOP. Baseline cr of 1.5, slowly improving.  Chest pain on 11/25 Atypical , likely musculoskeletal. Stable on tele. ekg unremarkable. No further symptoms    Essential hypertension -cont BiDil    Acute respiratory failure with hypoxia/MRSA positive post Aspiration pneumonia ? HCAP -stable on [place]  Completed IV vancomycin on 11/26. Still has some leucocytosis. No active infection noted.   Chronic hepatitis C Normal LFTs . Follow up as outpt   DM2 (diabetes mellitus, type 2) -stable. continue glocotrol and SSI   Peripheral vascular disease/Hx of AKA  stablle     DVT prophylaxis: SCDs ( sq heparin dced today being s/p ICD)  Code Status: Full Family Communication: None at  bedside Disposition Plan: to SNF possibly tomorrow if remains afebrile and mental status improved  Consultants: Cardiology EP PCCM Neurology     HPI/Subjective: Patient seen and examined. Patient more confusion drowsy this morning. Also was febrile with MAXIMUM TEMPERATURE of 100.9 overnight. Spike temperature to 101.72F later this morning  Objective: Filed Vitals:   05/20/14 1141  BP: 105/53  Pulse: 72  Temp: 101.3 F (38.5 C)  Resp: 20    Intake/Output Summary (Last 24 hours) at 05/20/14 1144 Last data filed at 05/20/14 0630  Gross per 24 hour  Intake   1220 ml  Output   3250 ml  Net  -2030 ml   Filed Weights   05/19/14 0419 05/19/14 2356 05/20/14 0600  Weight: 86.093 kg (189 lb 12.8 oz) 85.458 kg (188 lb 6.4 oz) 85.458 kg (188 lb 6.4 oz)    Exam:   General: Elderly male in no acute distress, appears lethargic  HEENT: Moist oral mucosa  Chest: Clear to auscultation bilaterally, dressing over ICD clean  Cardiovascular: Normal S1 and S2, no murmurs  Abdomen: Soft, nondistended, nontender  EXT: Warm, left AKA, no edema  CNS: sleepy but arousable, Data Reviewed: Basic Metabolic Panel:  Recent Labs Lab 05/14/14 0415 05/15/14 0500 05/16/14 0700 05/17/14 0357 05/18/14 0435 05/19/14 0221  NA 135* 149* 147 142 143 141  K 3.9 3.6* 4.1 4.1 4.3 4.7  CL 99 105 107 103 107 107  CO2 27 29 28 22 23 21   GLUCOSE 100* 120* 121* 106* 86 85  BUN 18 33* 35* 35* 29* 25*  CREATININE 1.30 1.80* 2.01* 1.96* 1.82* 1.84*  CALCIUM 8.2* 9.2 8.8 8.4 8.1* 8.4  MG 1.8 2.7* 2.9*  --   --   --   PHOS 2.9 3.3  --   --   --   --    Liver Function Tests: No results for input(s): AST, ALT, ALKPHOS, BILITOT, PROT, ALBUMIN in the last 168 hours. No results for input(s): LIPASE, AMYLASE in the last 168 hours. No results for input(s): AMMONIA in the last 168 hours. CBC:  Recent Labs Lab 05/14/14 0415 05/15/14 0500 05/16/14 0700 05/18/14 0435 05/19/14 0221  WBC 13.3*  14.8* 14.7* 13.4* 14.8*  HGB 12.3* 12.9* 11.5* 10.8* 11.2*  HCT 38.2* 40.4 36.2* 34.0* 35.0*  MCV 83.6 84.0 82.3 81.7 82.2  PLT 191 279 280 314 340   Cardiac Enzymes: No results for input(s): CKTOTAL, CKMB, CKMBINDEX, TROPONINI in the last 168 hours. BNP (last 3 results) No results for input(s): PROBNP in the last 8760 hours. CBG:  Recent Labs Lab 05/19/14 0743 05/19/14 1138 05/19/14 1701 05/19/14 2206 05/20/14 0742  GLUCAP 87 91 108* 121* 95    Recent Results (from the past 240 hour(s))  Clostridium Difficile by PCR     Status: None   Collection Time: 05/10/14  1:13 PM  Result Value Ref Range Status   C difficile by pcr NEGATIVE NEGATIVE Final     Studies: Dg Chest 2 View  05/20/2014   CLINICAL DATA:  Cardiac arrest.  AICD implantation.  EXAM: CHEST  2 VIEW  COMPARISON:  Chest x-rays dated 05/14/2014 and 06/13/2010  FINDINGS: Endotracheal tube, NG tube, and central line have been removed. AICD in place.  Heart size and pulmonary vascularity are within normal limits. There is increased density posteriorly at the right lung base which probably represents a combination of atelectasis and a small effusion. No acute osseous abnormality.  IMPRESSION: Probable atelectasis and small effusion at the right lung base.   Electronically Signed   By: Violeta Gelinas.D.  On: 05/20/2014 08:50    Scheduled Meds: . atorvastatin  10 mg Oral q1800  . carvedilol  12.5 mg Oral BID WC  . feeding supplement (ENSURE COMPLETE)  237 mL Oral BID BM  . furosemide  40 mg Oral BID  . glipiZIDE  10 mg Oral QAC breakfast  . insulin aspart  0-5 Units Subcutaneous QHS  . insulin aspart  0-9 Units Subcutaneous TID WC  . isosorbide-hydrALAZINE  2 tablet Oral BID  . nicotine  7 mg Transdermal Daily   Continuous Infusions: . sodium chloride 10 mL/hr (05/19/14 2241)      Time spent: 25 minutes    Davinder Haff  [facility] Pager [phone]. If 7PM-7AM, please contact night-coverage at  [email or web address], password [facility] 05/20/2014, 11:44 AM  LOS: 16 days

## 2014-05-20 NOTE — Progress Notes (Signed)
Dr. Conley Rolls notified that patient is very agitated, reported that he had been given xanax and oxycodone at 1955pm, and repeat oxycodone at 2240 with no relief, very agitated, trying to get out of bed, using accessory muscles to breathe and pursed lip breathing, lung sounds diminished. Orders received verbally to give morphine 2mg  IV and lasix 40mg  IV. Patient had pulled IV out and 2 nurses attempted to restart without success. IV team notified.

## 2014-05-20 NOTE — Progress Notes (Signed)
Physical Therapy Treatment Patient Details Name: Juanda BondWilliam D Erway MRN: [id number] DOB: [birth year] Today's Date: 05/20/2014    History of Present Illness Pt adm with cardiac arrest and hypothermia protocol initiated. Pt extubated 11/22. PMH - lt AKA, CHF, CAD, Asthma; Allergy; PVD (peripheral vascular disease); Diabetes mellitus; Tobacco abuse; Hepatitis C; Hypertension; LBBB (left bundle branch block); Anemia, Cocaine use. S/p ICD placement 11/27.    PT Comments    Patient progressing towards physical therapy goals, able to tolerate therapeutic exercises, standing balance and performed stand pivot transfer with min assist today. Requires frequent cues for post-op ICD precautions; handout was provided and reviewed. Patient will continue to benefit from skilled physical therapy services to further improve independence with functional mobility.   Follow Up Recommendations  SNF     Equipment Recommendations  None recommended by PT    Recommendations for Other Services       Precautions / Restrictions Precautions Precautions: Fall;ICD/Pacemaker Precaution Comments: Handout provided and reviewed  Restrictions Weight Bearing Restrictions: Yes Other Position/Activity Restrictions: Post-op ICD implant precautions    Mobility  Bed Mobility Overal bed mobility: Needs Assistance Bed Mobility: Supine to Sit     Supine to sit: Min assist;HOB elevated     General bed mobility comments: Min assist for truncal control to seated position. Able to perform without pulling through LUE.  Transfers Overall transfer level: Needs assistance Equipment used: Rolling walker (2 wheeled) Transfers: Sit to/from UGI CorporationStand;Stand Pivot Transfers Sit to Stand: Min assist;From elevated surface Stand pivot transfers: Min assist       General transfer comment: Min assist for boost to stand and balance once standing. Tactile cues prevent excessive weight bearing through LUE, instructed hand placement on Lt  for balance only. Min assist for walker placement with pivot to chair. Demonstrates Good RLE strength. Pt was able to perform sit<>stand x2 from bed and tolerated standing balanace task for approximately 3 minutes before needing to sit from fatigue.  Ambulation/Gait                 Stairs            Wheelchair Mobility    Modified Rankin (Stroke Patients Only)       Balance     Sitting balance-Leahy Scale: Fair Sitting balance - Comments: Tolerated EOB sitting x5 minutes with Rt foot planted on floor, back unsupported. Mildy unstable with slow balance reactions but did not require physical assist to correct.                            Cognition Arousal/Alertness: Awake/alert Behavior During Therapy: WFL for tasks assessed/performed Overall Cognitive Status: Impaired/Different from baseline Area of Impairment: Orientation;Attention;Memory;Following commands;Safety/judgement;Awareness;Problem solving Orientation Level: Disoriented to;Time Current Attention Level: Sustained Memory: Decreased recall of precautions;Decreased short-term memory Following Commands: Follows one step commands consistently;Follows multi-step commands inconsistently Safety/Judgement: Decreased awareness of safety;Decreased awareness of deficits Awareness: Intellectual Problem Solving: Slow processing      Exercises General Exercises - Lower Extremity Ankle Circles/Pumps: AROM;10 reps;Seated;Right AutoZoneQuad Sets: Strengthening;10 reps;Seated;Right Gluteal Sets: Strengthening;Both;10 reps;Seated Straight Leg Raises: Strengthening;Right;10 reps;Seated    General Comments General comments (skin integrity, edema, etc.): Reported dizziness upon sitting EOB. See orthostatic vitals section.      Pertinent Vitals/Pain Pain Assessment: No/denies pain    Home Living                      Prior Function  PT Goals (current goals can now be found in the care plan  section) Progress towards PT goals: Progressing toward goals    Frequency  Min 2X/week    PT Plan Current plan remains appropriate    Co-evaluation             End of Session Equipment Utilized During Treatment: Gait belt Activity Tolerance: Patient tolerated treatment well Patient left: in chair;with call bell/phone within reach;with chair alarm set     Time: 1415-1456 (-5 min non-therapeutic while family in room) PT Time Calculation (min) (ACUTE ONLY): 41 min  Charges:  $Therapeutic Exercise: 8-22 mins $Therapeutic Activity: 8-22 mins                    G Codes:      Berton Mount 06-06-14, 3:57 PM  [place], [place] [phone]

## 2014-05-20 NOTE — Progress Notes (Signed)
ANTIBIOTIC CONSULT NOTE - INITIAL  Pharmacy Consult for Levaquin Indication: fever of unknown origin with leukocytosis  No Known Allergies  Patient Measurements: Height: 6' (182.9 cm) Weight: 188 lb 6.4 oz (85.458 kg) IBW/kg (Calculated) : 77.6  Vital Signs: Temp: 101.3 F (38.5 C) (11/28 1141) Temp Source: Oral (11/28 1141) BP: 105/53 mmHg (11/28 1141) Pulse Rate: 72 (11/28 1141) Intake/Output from previous day: 11/27 0701 - 11/28 0700 In: 1270 [P.O.:840; I.V.:380; IV Piggyback:50] Out: 3250 [Urine:3250] Intake/Output from this shift:    Labs:  Recent Labs  05/18/14 0435 05/19/14 0221  WBC 13.4* 14.8*  HGB 10.8* 11.2*  PLT 314 340  CREATININE 1.82* 1.84*   Estimated Creatinine Clearance: 41 mL/min (by C-G formula based on Cr of 1.84). No results for input(s): VANCOTROUGH, VANCOPEAK, VANCORANDOM, GENTTROUGH, GENTPEAK, GENTRANDOM, TOBRATROUGH, TOBRAPEAK, TOBRARND, AMIKACINPEAK, AMIKACINTROU, AMIKACIN in the last 72 hours.   Microbiology: Recent Results (from the past 720 hour(s))  Blood culture (routine x 2)     Status: None   Collection Time: 05/04/14  5:35 PM  Result Value Ref Range Status   Specimen Description BLOOD  Final   Special Requests   Final    BOTTLES DRAWN AEROBIC AND ANAEROBIC 5CC RT SUBCLAVIAN   Culture  Setup Time   Final    05/05/2014 01:30 Performed at [facility]    Culture   Final    NO GROWTH 5 DAYS Performed at [facility]    Report Status 05/11/2014 FINAL  Final  MRSA PCR Screening     Status: None   Collection Time: 05/04/14  7:39 PM  Result Value Ref Range Status   MRSA by PCR NEGATIVE NEGATIVE Final    Comment:        The GeneXpert MRSA Assay (FDA approved for NASAL specimens only), is one component of a comprehensive MRSA colonization surveillance program. It is not intended to diagnose MRSA infection nor to guide or monitor treatment for MRSA infections.   Blood culture (routine x 2)     Status: None    Collection Time: 05/04/14  9:00 PM  Result Value Ref Range Status   Specimen Description BLOOD LEFT ARM  Final   Special Requests BOTTLES DRAWN AEROBIC ONLY 2CC  Final   Culture  Setup Time   Final    05/05/2014 04:08 Performed at [facility]    Culture   Final    NO GROWTH 5 DAYS Performed at [facility]    Report Status 05/11/2014 FINAL  Final  Culture, respiratory (NON-Expectorated)     Status: None   Collection Time: 05/07/14  4:40 PM  Result Value Ref Range Status   Specimen Description TRACHEAL ASPIRATE  Final   Special Requests NONE  Final   Gram Stain   Final    ABUNDANT WBC PRESENT,BOTH PMN AND MONONUCLEAR RARE SQUAMOUS EPITHELIAL CELLS PRESENT MODERATE GRAM NEGATIVE COCCOBACILLI FEW GRAM POSITIVE COCCI IN PAIRS RARE GRAM NEGATIVE COCCI    Culture   Final    MODERATE METHICILLIN RESISTANT STAPHYLOCOCCUS AUREUS Note: RIFAMPIN AND GENTAMICIN SHOULD NOT BE USED AS SINGLE DRUGS FOR TREATMENT OF STAPH INFECTIONS. This organism DOES NOT demonstrate inducible Clindamycin resistance in vitro. CRITICAL RESULT CALLED TO, READ BACK BY AND VERIFIED WITH: SARA@7 :22AM ON  05/11/14 BY DANTS Performed at [facility]    Report Status 05/12/2014 FINAL  Final   Organism ID, Bacteria METHICILLIN RESISTANT STAPHYLOCOCCUS AUREUS  Final      Susceptibility   Methicillin resistant staphylococcus  aureus - MIC*    CLINDAMYCIN <=0.25 SENSITIVE Sensitive     ERYTHROMYCIN >=8 RESISTANT Resistant     GENTAMICIN <=0.5 SENSITIVE Sensitive     LEVOFLOXACIN 0.25 SENSITIVE Sensitive     OXACILLIN >=4 RESISTANT Resistant     PENICILLIN >=0.5 RESISTANT Resistant     RIFAMPIN <=0.5 SENSITIVE Sensitive     TRIMETH/SULFA <=10 SENSITIVE Sensitive     VANCOMYCIN 1 SENSITIVE Sensitive     TETRACYCLINE <=1 SENSITIVE Sensitive     * MODERATE METHICILLIN RESISTANT STAPHYLOCOCCUS AUREUS  Clostridium Difficile by PCR     Status: None   Collection Time: 05/10/14  1:13 PM  Result  Value Ref Range Status   C difficile by pcr NEGATIVE NEGATIVE Final    Medical History: Past Medical History  Diagnosis Date  . CHF (congestive heart failure)     EF 30-35%, nonischemic  . CAD (coronary artery disease)   . Hyperlipidemia     hypertriglyceridemia on niaspan and lipitor.  . Asthma   . Allergy   . PVD (peripheral vascular disease)     s/p left AKA @ [facility] (MRSA septic arthritis)  . Diabetes mellitus     dx in 2009, DKA, came unresponsive  . Tobacco abuse   . Hepatitis C   . Hypertension   . LBBB (left bundle branch block)   . Anemia of chronic disease     baseline Hgb now 13  . Thrombocytopenia     07/2007, went down to 90,000. likely 2/2 neurontin.  . Cocaine use     s/p cardiopulmonary arrest 12/25/2002 ([facility])  . Cholelithiasis     documented on CT abdomen in 07/2007  . Non-ischemic cardiomyopathy 04/2014    BiV-ICD placed    Assessment: 68yo male admitted on 11/12 in cardiac/resp arrest. Completed antibiotics for MRSA positive post aspiration pneumonia. To start empiric Levaquin today for fever and leukocytosis. WBC elevated at 14.8, febrile at 101.95F, QTc noted to be 486 (11/28). Evaluated med list, Zofran prn also QTc prolonging potential, but pt has not been receiving.  CTX 11/17 >> 11/22 Vanc 11/19 >> 11/26 Ancef 11/27 >> 11/28 Levaquin 11/28 >>   11/28 UA >> neg 11/28 CXR >> neg 11/28 BCx (2) >> pending  Plan:  -Levaquin 750mg  Q48h -Monitor renal function -QTc slightly prolonged, could repeat EKG in a few days -F/U C&S, WBC, clinical improvement  Waynette Butteryegan K. Braelyn Bordonaro, PharmD Clinical Pharmacy Resident Pager: [phone] 05/20/2014 2:24 PM

## 2014-05-20 NOTE — Progress Notes (Signed)
SUBJECTIVE: He did not sleep well last night.  Denies chest pain or shortness of breath.  Some productive cough of white phlegm. Some chills overnight.  Co pain t wound site  SOB last night   S/p CRTD 05-19-14  Tmax 100.9 overnight, WBC 14.8 yesterday (not repeated yet today)  CURRENT MEDICATIONS: . atorvastatin  10 mg Oral q1800  . carvedilol  12.5 mg Oral BID WC  . feeding supplement (ENSURE COMPLETE)  237 mL Oral BID BM  . glipiZIDE  10 mg Oral QAC breakfast  . heparin subcutaneous  5,000 Units Subcutaneous 3 times per day  . insulin aspart  0-5 Units Subcutaneous QHS  . insulin aspart  0-9 Units Subcutaneous TID WC  . isosorbide-hydrALAZINE  2 tablet Oral BID  . nicotine  7 mg Transdermal Daily   . sodium chloride 10 mL/hr (05/19/14 2241)     OBJECTIVE: Physical Exam: Filed Vitals:   05/19/14 2334 05/19/14 2356 05/20/14 0200 05/20/14 0600  BP:  132/62 147/65 128/67  Pulse:  69    Temp:  99.2 F (37.3 C)  100.5 F (38.1 C)  TempSrc:  Axillary  Oral  Resp:  25 23 20   Height:      Weight:  188 lb 6.4 oz (85.458 kg)  188 lb 6.4 oz (85.458 kg)  SpO2: 90% 93%  94%    Intake/Output Summary (Last 24 hours) at 05/20/14 1540 Last data filed at 05/20/14 0630  Gross per 24 hour  Intake   1270 ml  Output   3250 ml  Net  -1980 ml    Telemetry reveals sinus rhythm with ventricular pacing  GEN- The patient is well appearing, no distress  Restless and confused Head- normocephalic, atraumatic Eyes-  Sclera clear, conjunctiva pink Ears- hearing intact Oropharynx- clear Neck- supple  Pocket without hematoma Lungs- Decreased breath sounds, normal work of breathing Heart- Regular rate and rhythm, no murmurs, rubs or gallops, PMI not laterally displaced GI- soft, NT, ND, + BS Extremities- no clubbing, cyanosis, or edema, s/p L AKA Skin- no rash or lesion, tegaderm over left ICD site   LABS: Basic Metabolic Panel:  Recent Labs  08/67/61 0435 05/19/14 0221  NA  143 141  K 4.3 4.7  CL 107 107  CO2 23 21  GLUCOSE 86 85  BUN 29* 25*  CREATININE 1.82* 1.84*  CALCIUM 8.1* 8.4   CBC:  Recent Labs  05/18/14 0435 05/19/14 0221  WBC 13.4* 14.8*  HGB 10.8* 11.2*  HCT 34.0* 35.0*  MCV 81.7 82.2  PLT 314 340    RADIOLOGY: CXR pending this morning  ASSESSMENT AND PLAN:  Active Problems:   Chronic hepatitis C   DM2 (diabetes mellitus, type 2)   Essential hypertension   Peripheral vascular disease   Cardiac arrest   VF (ventricular fibrillation)   Nonischemic cardiomyopathy   Hx of AKA (above knee amputation)   Aspiration pneumonia   Acute delirium   Acute respiratory failure with hypoxia   Ventricular fibrillation   HCAP (healthcare-associated pneumonia)   MRSA pneumonia   Chronic hepatitis C without hepatic coma   Acute kidney injury   Other chest pain  1.  VF arrest with non-obstructive CAD at cath S/p CRTD implant 05-19-14 CXR - official read pending, no obvious ptx Device interrogated and found to be functioning normally  2.  NICM Euvolemic on exam Continue Coreg, Bidil No ACE-I/ARB 2/2 renal insufficiency  3.  MRSA pneumonia Completed IV Vanc  TMax overnight 100.9  Management per primary team  Encephalopathy   Would hold sedatives and narcotics and delay transfer  Will recheck BMET in am and begin ace if stable

## 2014-05-21 DIAGNOSIS — G931 Anoxic brain damage, not elsewhere classified: Secondary | ICD-10-CM | POA: Diagnosis not present

## 2014-05-21 LAB — BASIC METABOLIC PANEL
Anion gap: 14 (ref 5–15)
BUN: 23 mg/dL (ref 6–23)
CHLORIDE: 103 meq/L (ref 96–112)
CO2: 20 meq/L (ref 19–32)
Calcium: 8.5 mg/dL (ref 8.4–10.5)
Creatinine, Ser: 1.89 mg/dL — ABNORMAL HIGH (ref 0.50–1.35)
GFR calc Af Amer: 40 mL/min — ABNORMAL LOW (ref >90)
GFR calc non Af Amer: 34 mL/min — ABNORMAL LOW (ref >90)
GLUCOSE: 138 mg/dL — AB (ref 70–99)
POTASSIUM: 4.9 meq/L (ref 3.7–5.3)
SODIUM: 137 meq/L (ref 137–147)

## 2014-05-21 LAB — GLUCOSE, CAPILLARY
GLUCOSE-CAPILLARY: 120 mg/dL — AB (ref 70–99)
GLUCOSE-CAPILLARY: 138 mg/dL — AB (ref 70–99)

## 2014-05-21 MED ORDER — HYDROCODONE-ACETAMINOPHEN 5-325 MG PO TABS
2.0000 | ORAL_TABLET | Freq: Once | ORAL | Status: AC
  Administered 2014-05-21: 2 via ORAL
  Filled 2014-05-21: qty 2

## 2014-05-21 MED ORDER — OXYCODONE-ACETAMINOPHEN 5-325 MG PO TABS
1.0000 | ORAL_TABLET | Freq: Once | ORAL | Status: AC
  Administered 2014-05-21: 1 via ORAL
  Filled 2014-05-21: qty 1

## 2014-05-21 MED ORDER — OXYCODONE HCL 5 MG PO TABS
10.0000 mg | ORAL_TABLET | Freq: Once | ORAL | Status: AC
  Administered 2014-05-21: 10 mg via ORAL
  Filled 2014-05-21: qty 2

## 2014-05-21 MED ORDER — ENSURE COMPLETE PO LIQD: 237.0000 mL | Freq: Two times a day (BID) | ORAL | Status: DC

## 2014-05-21 MED ORDER — TRAMADOL-ACETAMINOPHEN 37.5-325 MG PO TABS: 1.0000 | ORAL_TABLET | Freq: Four times a day (QID) | ORAL | Status: DC | PRN

## 2014-05-21 MED ORDER — ENALAPRIL MALEATE 2.5 MG PO TABS: 2.5000 mg | ORAL_TABLET | Freq: Every day | ORAL | Status: AC

## 2014-05-21 MED ORDER — OXYCODONE-ACETAMINOPHEN 10-325 MG PO TABS: 1.0000 | ORAL_TABLET | Freq: Three times a day (TID) | ORAL | Status: DC | PRN

## 2014-05-21 MED ORDER — FUROSEMIDE 80 MG PO TABS: 40.0000 mg | ORAL_TABLET | Freq: Two times a day (BID) | ORAL | Status: AC

## 2014-05-21 MED ORDER — ISOSORB DINITRATE-HYDRALAZINE 20-37.5 MG PO TABS: 2.0000 | ORAL_TABLET | Freq: Two times a day (BID) | ORAL | Status: AC

## 2014-05-21 MED ORDER — NICOTINE 7 MG/24HR TD PT24: 7.0000 mg | MEDICATED_PATCH | Freq: Every day | TRANSDERMAL | Status: DC

## 2014-05-21 MED ORDER — LEVOFLOXACIN 750 MG PO TABS: 750.0000 mg | ORAL_TABLET | ORAL | Status: AC

## 2014-05-21 MED ORDER — CARVEDILOL 12.5 MG PO TABS: 25.0000 mg | ORAL_TABLET | Freq: Two times a day (BID) | ORAL | Status: AC

## 2014-05-21 MED ORDER — ASPIRIN EC 81 MG PO TBEC: 81.0000 mg | DELAYED_RELEASE_TABLET | Freq: Every day | ORAL | Status: AC

## 2014-05-21 NOTE — Plan of Care (Signed)
Problem: Discharge Progression Outcomes Goal: Hemodynamically stable Outcome: Completed/Met Date Met:  05/21/14 Goal: Barriers To Progression Addressed/Resolved Outcome: Completed/Met Date Met:  05/21/14 Goal: Discharge plan in place and appropriate Outcome: Completed/Met Date Met:  05/21/14 Goal: ICD site without S/S of infection Outcome: Completed/Met Date Met:  05/21/14 Goal: If MI, Core Measure meds ordered at D/C Outcome: Completed/Met Date Met:  05/21/14 Goal: Pain controlled with appropriate interventions Outcome: Completed/Met Date Met:  05/21/14 Goal: Tolerating diet Outcome: Completed/Met Date Met:  05/21/14 Goal: Activity appropriate for discharge plan Outcome: Completed/Met Date Met:  05/21/14 Goal: Other Discharge Outcomes/Goals Outcome: Completed/Met Date Met:  05/21/14

## 2014-05-21 NOTE — Discharge Summary (Signed)
Physician Discharge Summary  Jonathon Galvan:[id number] DOB: [birth year] DOA: 05/04/2014  PCP: Quitman Livings, MD  Admit date: 05/04/2014 Discharge date: 05/21/2014  Time spent: 35 minutes  Recommendations for Outpatient Follow-up:   Discharged to skilled nursing facility [facility] health)  Please check cbc and BMET in 3-4 days. Completes levaquin on 05/25/2014  Follow-up with cardiology and EP as outpatient (office will call with appointment)  Discharge Diagnoses:  Principal Problem:  Cardiac arrest   Active Problems:  Acute respiratory failure with hypoxia  HCAP (healthcare-associated pneumonia)  Chronic hepatitis C  DM2 (diabetes mellitus, type 2)  Essential hypertension  Peripheral vascular disease  VF (ventricular fibrillation)  Nonischemic cardiomyopathy  Hx of AKA (above knee amputation)  Aspiration pneumonia  Acute delirium  Ventricular fibrillation  MRSA pneumonia  Chronic hepatitis C without hepatic coma  Acute kidney injury  Other chest pain  Cardiac defibrillator in place  Fever presenting with conditions classified elsewhere  Anoxic brain damage Protein calorie malnutrition Tobacco abuse   Discharge Condition: Fair  Diet recommendation: heart healthy/ diabetic with supplements  Filed Weights   05/19/14 2356 05/20/14 0600 05/21/14 0400  Weight: 85.458 kg (188 lb 6.4 oz) 85.458 kg (188 lb 6.4 oz) 85.276 kg (188 lb)    History of present illness:  68 year old male patient with extensive past medical history including nonischemic cardiomyopathy admitted to the hospital after undergoing a ventricular fibrillation arrest. Upon arrival of EMS the patient was noted to be in ventricular fibrillation he underwent defibrillation and 5 minutes of CPR. After presenting to the emergency department he arrested again for another 60 seconds; emergent pacing begun and patient stabilized.  He was admitted to the ICU where he  remained intubated for 10 days. It was suspected patient had associated aspiration pneumonitis in the setting of cardiac arrest. Because of presenting with ventricular fibrillation hypothermia protocol was implemented. Cardiology was consulted. 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 All of echocardiogram on 1114 revealed an EF of 30-35%.  On 11/17 patient developed extreme agitation consistent with acute delirium. Neurology was consulted. CT of the head as well as MRI of the brain were unrevealing. IV Depakote was initiated to treat acute delirium.  He was eventually extubated and transferred to the stepdown unit. He was evaluated by CIR who recommended patient be discharged to skilled nursing facility .  MAJOR EVENTS/TEST RESULTS: 11/12 Admission via ED after VF arrest  11/12 LHC: Nonobstructive CAD. LVEF approx 40-45% 11/12 Hypothermia protocol implemented  11/12 CT head: NAICP 11/12 CT neck: No cervical spine fracture or traumatic subluxation. Advanced multilevel cervical spondylosis is noted 11/14 echo>>>30% to 35%. Dyskinesis of the mid-apicalanteroseptal myocardium. Doppler parameters are consistent with abnormal left ventricular relaxation (grade 1 diastolic dysfunction). 11/16 eeg>>>no seizure, generalized slowing, c/w hypoxic encephalopathy  11/17: several nurses holding him down. Very agitated. Still w/ copious secretions from ETT. Started on diprivan, precedex stopped. Also started on clonazepam and Seroquel.  11/23 Passed swallow eval 11/24: transferred to medical floor 11/25-: evaluated by EP for ICD 11/26: C/O musculoskeletal chest pain 11/27: ICD placed 11/28: unexplained fever  Hospital course: Principal problem  Cardiac arrest/VF (ventricular fibrillation)/ Nonischemic cardiomyopathy -ICD placed on 11/27. Tolerated well, c/o pain at the site, no fluctuating , stitches intact -started  BiDil, coreg dose reduced  to 12.5 mg bid, added baby aspirin.  Resumed lasix at a lower dose ( 40 mg bid) -resumed low dose vasotec. Monitor renal fn at SNF -no evidence of  CHF decompensation at this time.  Fever with leucocytosis on 11/27 Patient spiked temp with Tmax of 101.5, leucocytosis noted. Marland Kitchen UA and CXR negative for infection. Pt reported cough . Possibly could have aspirated. CXR with mild bilateral effusion. ordered blood cx repeated and negative to date. . starded on empiric Levaquin. Will treat for a 5 day course ( afebrile past 24 hrs)  Completed 2 weeks course of vancomycin on 11/26.  Persistent delirium with anoxic brain injury -Possibly following cardiac arrest. Patient was started on Depakote which I discontinued 11/27 given improvement in mental status.  Mental status improved today. Has episodes of blanking out during conversation and wakes up within 1-2 seconds. Discontinues amitriptyline and benzos. Minimize pain meds. dced home morphine and will discharge on prn ultracet and  percocet. -follow up closely at SNF   Acute on chronic kidney ds ? Prerenal vs cardiorenal. Has good UOP. Baseline cr of 1.5, slowly improving, 1.8 today. Resumed vasotec at lower dose ( 2.5 mg daily)  Recheck in 3-4 days.  Chest pain on 11/25 Atypical , likely musculoskeletal. Stable on tele. ekg unremarkable. No further symptoms    Essential hypertension -cont BiDil, coreg and ARB    Acute respiratory failure with hypoxia/MRSA positive post Aspiration pneumonia ? HCAP -stable on  Room air. Completed IV vancomycin on 11/26. Still has some leucocytosis.  Started on empiric levaquin q 48 hrs for 5 days. If further risk of aspiration will need swallow evalaution as outpt.   Chronic hepatitis C Normal LFTs . Follow up as outpt   DM2 (diabetes mellitus, type 2) -stable. Resumed glocotrol and metformin   Peripheral vascular disease/Hx of AKA  stable   Protein calorie malnutrition Added  supplements  Tobacco abuse counseled on cessation. Nicotine patch prescribed.     Code Status: Full Family Communication: wife at bedside Disposition Plan: to SNF   Consultants: Cardiology EP PCCM Neurology   Discharge Exam: Filed Vitals:   05/21/14 0730  BP: 148/72  Pulse: 72  Temp: 98.9 F (37.2 C)  Resp: 16    General: Elderly male in no acute distress,   HEENT: no pallor, Moist oral mucosa  Chest: Clear to auscultation bilaterally, dressing over ICD clean, tender to pressure  Cardiovascular: Normal S1 and S2, no murmurs  Abdomen: Soft, nondistended, nontender  EXT: Warm, left AKA, no edema  CNS: alert and oriented, ( improved mental status today), has episodic wondering spells during conversation  Discharge Instructions You were cared for by a hospitalist during your hospital stay. If you have any questions about your discharge medications or the care you received while you were in the hospital after you are discharged, you can call the unit and asked to speak with the hospitalist on call if the hospitalist that took care of you is not available. Once you are discharged, your primary care physician will handle any further medical issues. Please note that NO REFILLS for any discharge medications will be authorized once you are discharged, as it is imperative that you return to your primary care physician (or establish a relationship with a primary care physician if you do not have one) for your aftercare needs so that they can reassess your need for medications and monitor your lab values.   Current Discharge Medication List    START taking these medications   Details  feeding supplement, ENSURE COMPLETE, (ENSURE COMPLETE) LIQD Take 237 mLs by mouth 2 (two) times daily between meals. Qty: 60 Bottle, Refills: 0  isosorbide-hydrALAZINE (BIDIL) 20-37.5 MG per tablet Take 2 tablets by mouth 2 (two) times daily. Qty: 120 tablet, Refills: 0    levofloxacin  (LEVAQUIN) 750 MG tablet Take 1 tablet (750 mg total) by mouth every other day. Qty: 2 tablet, Refills: 0    nicotine (NICODERM CQ - DOSED IN MG/24 HR) 7 mg/24hr patch Place 1 patch (7 mg total) onto the skin daily. Qty: 28 patch, Refills: 0    traMADol-acetaminophen (ULTRACET) 37.5-325 MG per tablet Take 1 tablet by mouth every 6 (six) hours as needed for moderate pain. Qty: 30 tablet, Refills: 0   Aspirin 81 mg po tablet                          Take 1 tablet daily    CONTINUE these medications which have CHANGED   Details  carvedilol (COREG) 12.5 MG tablet Take 2 tablets (25 mg total) by mouth 2 (two) times daily with a meal. Qty: 30 tablet, Refills: 0   Associated Diagnoses: Essential hypertension    furosemide (LASIX) 80 MG tablet Take 0.5 tablets (40 mg total) by mouth 2 (two) times daily. Qty: 60 tablet, Refills: 11   Associated Diagnoses: Essential hypertension    oxyCODONE-acetaminophen (PERCOCET) 10-325 MG per tablet Take 1 tablet by mouth every 8 (eight) hours as needed for pain. Qty: 30 tablet, Refills: 0   enalapril ( vasotec) 2.5 mg tablet                Take 1 tablet daily    CONTINUE these medications which have NOT CHANGED   Details  atorvastatin (LIPITOR) 10 MG tablet Take 1 tablet (10 mg total) by mouth daily. Qty: 30 tablet, Refills: 5   Associated Diagnoses: Dyslipidemia    bacitracin ointment Apply 1 application topically 2 (two) times daily. Qty: 120 g, Refills: 0    cetirizine (ZYRTEC) 10 MG tablet Take 1 tablet (10 mg total) by mouth daily. Qty: 30 tablet, Refills: 5   Associated Diagnoses: Allergic rhinitis            glipiZIDE (GLUCOTROL) 10 MG tablet Take 1 tablet by mouth daily before breakfast.     metFORMIN (GLUCOPHAGE) 1000 MG tablet Take 1 tablet (1,000 mg total) by mouth 2 (two) times daily with a meal. Qty: 60 tablet, Refills: 11   Associated Diagnoses: Diabetes mellitus    niacin (NIASPAN) 500 MG CR tablet Take 1 tablet by mouth daily.     venlafaxine (EFFEXOR) 50 MG tablet Take 1 tablet (50 mg total) by mouth at bedtime. Qty: 30 tablet, Refills: 5   Associated Diagnoses: Depression      STOP taking these medications     amitriptyline (ELAVIL) 100 MG tablet      amLODipine (NORVASC) 10 MG tablet      morphine (MS CONTIN) 60 MG 12 hr tablet      zolpidem (AMBIEN) 10 MG tablet        No Known Allergies Follow-up Information    Follow up with Sherryl MangesSteven Klein, MD.   Specialty:  Cardiology   Why:  you will need to be seen in device clinic in 7 days or so, our office will call you with the date and time, then you will need to se Dr. Graciela HusbandsKlein in 3 months.   Contact information:   [address] [place] [id number] [phone]       Follow up with Tonny BollmanMichael Cooper, MD.  Specialty:  Cardiology   Why:  the office will call and arrange a date and time for you to be seen by PA/NP   Contact information:   [address] [place] [address] [phone]       Please follow up.   Why:  MD at SNF       The results of significant diagnostics from this hospitalization (including imaging, microbiology, ancillary and laboratory) are listed below for reference.    Significant Diagnostic Studies: Dg Chest 2 View  05/20/2014   CLINICAL DATA:  Cardiac arrest.  AICD implantation.  EXAM: CHEST  2 VIEW  COMPARISON:  Chest x-rays dated 05/14/2014 and 06/13/2010  FINDINGS: Endotracheal tube, NG tube, and central line have been removed. AICD in place.  Heart size and pulmonary vascularity are within normal limits. There is increased density posteriorly at the right lung base which probably represents a combination of atelectasis and a small effusion. No acute osseous abnormality.  IMPRESSION: Probable atelectasis and small effusion at the right lung base.   Electronically Signed   By: Geanie Cooley M.D.   On: 05/20/2014 08:50   Ct Head Wo Contrast  05/09/2014   CLINICAL DATA:  Cerebral edema.   Status post cardiac arrest.  EXAM: CT HEAD WITHOUT CONTRAST  TECHNIQUE: Contiguous axial images were obtained from the base of the skull through the vertex without intravenous contrast.  COMPARISON:  CT scan of May 04, 2014.  FINDINGS: Bony calvarium appears intact. Bilateral ethmoid and sphenoid and left maxillary sinusitis is noted. Mild diffuse cortical atrophy is noted. Mild chronic ischemic white matter disease is noted. No mass effect or midline shift is noted. Ventricular size is within normal limits. There is no evidence of mass lesion, hemorrhage or acute infarction.  IMPRESSION: Mild diffuse cortical atrophy. Mild chronic ischemic white matter disease. Bilateral ethmoid and sphenoid sinusitis. No acute intracranial abnormality seen.   Electronically Signed   By: Roque Lias M.D.   On: 05/09/2014 15:17   Ct Head Wo Contrast  05/04/2014   CLINICAL DATA:  Syncopal episode. Patient found down. Out of hospital cardiac arrest.  EXAM: CT HEAD WITHOUT CONTRAST  CT CERVICAL SPINE WITHOUT CONTRAST  TECHNIQUE: Multidetector CT imaging of the head and cervical spine was performed following the standard protocol without intravenous contrast. Multiplanar CT image reconstructions of the cervical spine were also generated.  COMPARISON:  Prior CT head from 08/10/2007.  FINDINGS: CT HEAD FINDINGS  No evidence for acute infarction, hemorrhage, mass lesion, hydrocephalus, or extra-axial fluid. Mild cerebral and cerebellar atrophy. Hypoattenuation of white matter is consistent with chronic microvascular ischemic change.  The calvarium is intact. There is no sinus or mastoid air fluid level although the ethmoids are opacified. Likely post intubation. The patient is intubated, with accompanying nasopharyngeal fluid.  CT CERVICAL SPINE FINDINGS  There is no visible cervical spine fracture, traumatic subluxation, prevertebral soft tissue swelling, or intraspinal hematoma. Mild straightening of the normal cervical  lordosis. Mild ossification of the posterior longitudinal ligament. Multilevel disc space narrowing from C2 through T1. Moderately advanced facet arthropathy at C2-3 on the LEFT. Vascular calcification. Central venous line. Endotracheal tube. No pneumothorax. No worrisome neck mass.  IMPRESSION: Chronic changes as described. Similar appearance to 2009. No acute intracranial findings.  No cervical spine fracture or traumatic subluxation. Advanced multilevel cervical spondylosis is noted.  Visualized support tubes and lines appear appropriately placed.  Findings discussed with consulting MD.   Electronically Signed   By: Jonny Ruiz  Curnes M.D.   On: 05/04/2014 18:26   Ct Cervical Spine Wo Contrast  05/04/2014   CLINICAL DATA:  Syncopal episode. Patient found down. Out of hospital cardiac arrest.  EXAM: CT HEAD WITHOUT CONTRAST  CT CERVICAL SPINE WITHOUT CONTRAST  TECHNIQUE: Multidetector CT imaging of the head and cervical spine was performed following the standard protocol without intravenous contrast. Multiplanar CT image reconstructions of the cervical spine were also generated.  COMPARISON:  Prior CT head from 08/10/2007.  FINDINGS: CT HEAD FINDINGS  No evidence for acute infarction, hemorrhage, mass lesion, hydrocephalus, or extra-axial fluid. Mild cerebral and cerebellar atrophy. Hypoattenuation of white matter is consistent with chronic microvascular ischemic change.  The calvarium is intact. There is no sinus or mastoid air fluid level although the ethmoids are opacified. Likely post intubation. The patient is intubated, with accompanying nasopharyngeal fluid.  CT CERVICAL SPINE FINDINGS  There is no visible cervical spine fracture, traumatic subluxation, prevertebral soft tissue swelling, or intraspinal hematoma. Mild straightening of the normal cervical lordosis. Mild ossification of the posterior longitudinal ligament. Multilevel disc space narrowing from C2 through T1. Moderately advanced facet arthropathy  at C2-3 on the LEFT. Vascular calcification. Central venous line. Endotracheal tube. No pneumothorax. No worrisome neck mass.  IMPRESSION: Chronic changes as described. Similar appearance to 2009. No acute intracranial findings.  No cervical spine fracture or traumatic subluxation. Advanced multilevel cervical spondylosis is noted.  Visualized support tubes and lines appear appropriately placed.  Findings discussed with consulting MD.   Electronically Signed   By: Davonna Belling M.D.   On: 05/04/2014 18:26   Mr Brain Wo Contrast  05/11/2014   CLINICAL DATA:  68 year old male status post cardiac arrest an resuscitation with persistent encephalopathy, anoxic brain injury suspected. Initial encounter.  EXAM: MRI HEAD WITHOUT CONTRAST  TECHNIQUE: Multiplanar, multiecho pulse sequences of the brain and surrounding structures were obtained without intravenous contrast.  COMPARISON:  Head CT without contrast 05/09/2004. Brain MRI 08/11/2007.  FINDINGS: No restricted diffusion or evidence of acute infarction. Signal in the deep gray matter nuclei is stable and normal for age except for incidental perivascular spaces. Brainstem and cerebellum are stable and within normal limits. There is patchy T2 and FLAIR hyperintensity in the cerebral white matter which has progressed since 2009, but is nonspecific. These areas show facilitated diffusion.  No acute intracranial hemorrhage identified. No midline shift, mass effect, or evidence of intracranial mass lesion. No ventriculomegaly. Negative pituitary and cervicomedullary junction. Negative for age visualized cervical spine. Normal bone marrow signal.  Fluid in the pharynx. The patient is intubated. Mild bilateral mastoid effusions. Fluid in mucosal thickening in the dependent paranasal sinuses. Postoperative change to the left globe. Other orbits soft tissues are within normal limits. Visualized scalp soft tissues are within normal limits.  IMPRESSION: 1. No evidence of anoxic  injury or acute intracranial abnormality. 2. Progressed but nonspecific cerebral white matter signal changes since 2009, most commonly due to chronic small vessel disease. 3. Intubated, with paranasal sinus and mastoid inflammatory changes.   Electronically Signed   By: Augusto Gamble M.D.   On: 05/11/2014 16:52   Dg Chest Port 1 View  05/14/2014   CLINICAL DATA:  Post cardiac catheterization, intubation, history CHF, coronary artery disease, diabetes, smoking, hypertension  EXAM: PORTABLE CHEST - 1 VIEW  COMPARISON:  Portable exam 0524 hr compared to 05/13/2014  FINDINGS: Tip of endotracheal tube projects 3.6 cm above carina.  Tip of LEFT jugular central venous catheter projects over SVC at the  level of the azygos confluence.  Nasogastric tube extends into stomach.  Enlargement of cardiac silhouette with pulmonary vascular congestion.  Improved bibasilar aeration since previous study.  Upper lungs clear.  No pleural effusion or pneumothorax.  IMPRESSION: Improved bibasilar aeration versus previous study.   Electronically Signed   By: Ulyses Southward M.D.   On: 05/14/2014 07:34   Dg Chest Port 1 View  05/13/2014   CLINICAL DATA:  Endotracheal tube placement.  EXAM: PORTABLE CHEST - 1 VIEW  COMPARISON:  05/12/2014  FINDINGS: Endotracheal tube has tip approximately 5.3 cm above the carina. Nasogastric tube courses into the region of the stomach and off the film as tip is not visualized. Left IJ central venous catheter is unchanged with tip obliquely oriented over the region of the SVC. Lungs are adequately inflated with mild left basilar opacification suggesting a small amount left pleural fluid/atelectasis, although cannot exclude infection. Cardiomediastinal silhouette and remainder of the exam is unchanged.  IMPRESSION: Persistent mild left basilar opacification suggesting effusion with atelectasis, although cannot exclude infection.  Tubes and lines as described.   Electronically Signed   By: Elberta Fortis M.D.    On: 05/13/2014 09:03   Dg Chest Port 1 View  05/12/2014   CLINICAL DATA:  Shortness of breath and pneumonia, intubated patient  EXAM: PORTABLE CHEST - 1 VIEW  COMPARISON:  Portable chest x-ray of May 11, 2014  FINDINGS: The lungs are well-expanded. The left hemidiaphragm the left lateral costophrenic gutter however less well demonstrated today. The pulmonary interstitial markings are mildly increased though stable. Cardiopericardial silhouette is enlarged. There is tortuosity of the descending thoracic aorta.  The endotracheal tube tip lies 3.6 cm above the crotch of the carina. The esophagogastric tube tip projects below the inferior margin of the image. Left internal jugular venous catheter tip projects over the midportion of the SVC.  The abnormality associated with the lateral aspect of the right fifth rib is unchanged. There is no pneumothorax.  IMPRESSION: Mildly increased density at the left lung base is consistent with atelectasis and small pleural effusion. There is stable enlargement of the cardiac silhouette and mild pulmonary interstitial edema.   Electronically Signed   By: David  [place]   On: 05/12/2014 08:00   Dg Chest Port 1 View  05/11/2014   CLINICAL DATA:  Intubated patient with history of shortness of breath and pneumonia  EXAM: PORTABLE CHEST - 1 VIEW  COMPARISON:  Portable chest x-ray of May 10, 2014  FINDINGS: The lungs are well-expanded. The interstitial markings have improved dramatically. Minimal increased density persists in the retrocardiac region on the left. The cardiac silhouette remains enlarged. The pulmonary vascularity is more normal today. There is no significant pleural effusion. There is a stable appearance of the destructive lesion of the lateral aspect of the right fifth rib.  The endotracheal tube tip lies 2.9 cm above the crotch of the carina. The esophagogastric tube tip projects below the inferior margin of the image. The the observed bony thorax is  unremarkable. Left internal jugular venous catheter tip projects over the proximal SVC.  IMPRESSION: Dramatic improvement in the appearance of the lungs since yesterday's study consistent with resolving interstitial edema and/or pneumonia. Persistent left lower lobe atelectasis or pneumonia is present. Abnormal contour of the lateral aspect of the right fifth rib is unchanged.   Electronically Signed   By: David  [place]   On: 05/11/2014 07:36   Dg Chest Port 1 View  05/10/2014   CLINICAL DATA:  68 year old male with shortness of breath and pneumonia. Initial encounter.  EXAM: PORTABLE CHEST - 1 VIEW  COMPARISON:  05/09/2014 and earlier.  FINDINGS: Portable AP semi upright view at 0438 hrs. Stable endotracheal tube. Stable left IJ central line. Stable visualized enteric tube.  Continued dense retrocardiac opacity and veiling opacity at both lung bases. Stable pulmonary vascularity. No pneumothorax. The patient is more rotated to the right.  Evidence of right lateral fourth or fifth rib destruction (arrow). The right lateral ribs were normal on 08/10/2007 chest CT. No other acute osseous abnormality identified.  IMPRESSION: 1.  Stable lines and tubes. 2. Stable ventilation with bilateral pleural effusions and lower lobe collapse or consolidation. 3. Destruction versus fracture of the right lateral fourth or fifth rib (arrow). Has there been recent CPR or is there a known malignancy?   Electronically Signed   By: Augusto Gamble M.D.   On: 05/10/2014 06:20   Dg Chest Port 1 View  05/09/2014   CLINICAL DATA:  Assess ET tube position.  EXAM: PORTABLE CHEST - 1 VIEW  COMPARISON:  05/08/2014  FINDINGS: The endotracheal tube is 5.3 cm above the carina. The NG tube is stable. The left IJ catheter is stable. Persistent cardiac enlargement and vascular congestion. Small pleural effusions and bibasilar atelectasis.  IMPRESSION: Stable support apparatus.  Persistent vascular congestion, mild edema, small effusions and  bibasilar atelectasis.   Electronically Signed   By: Loralie Champagne M.D.   On: 05/09/2014 07:40   Dg Chest Port 1 View  05/08/2014   CLINICAL DATA:  68 year old male status post central line placement.  EXAM: PORTABLE CHEST - 1 VIEW  COMPARISON:  05/08/2014, 5:08 a.m.  FINDINGS: Cardiomediastinal silhouette unchanged in size and contour. Fullness of the central vasculature again evident.  Diffuse interstitial and airspace opacities. Improved aeration at the bases of the lungs.  Interval placement of left IJ central venous catheter, which terminates in the region of the left brachycephalic vein and superior vena cava confluence. No evidence of pneumothorax.  Gastric tube again projects over the mediastinum, terminating out of the field of view. The gastric port projects over the region of the stomach.  Endotracheal tube is unchanged terminating suitably above the carina.  IMPRESSION: Interval placement of left IJ central venous catheter, which terminates at the confluence of the superior vena cava and left brachycephalic vein. No evidence of pneumothorax.  Otherwise unchanged support apparatus.  Similar appearance of mixed interstitial and airspace disease, compatible with edema.  Improved aeration at the bilateral lung bases.  Signed,  Yvone Neu. Loreta Ave, DO  [facility]  [facility]   Electronically Signed   By: Gilmer Mor D.O.   On: 05/08/2014 17:30   Dg Chest Port 1 View  05/08/2014   CLINICAL DATA:  Ventilator dependent respiratory failure. Acute respiratory acidosis. Shortness of breath. Prior history of CHF.  EXAM: PORTABLE CHEST - 1 VIEW  COMPARISON:  Portable chest x-rays yesterday dating back to 05/04/2014.  FINDINGS: Endotracheal tube tip in satisfactory position projecting approximately 3 cm above the carina. Nasogastric tube courses below the diaphragm into the stomach. Cardiac silhouette moderately to markedly enlarged but stable. Interval  development of mild-to-moderate diffuse interstitial and airspace pulmonary edema since yesterday. Developing bilateral pleural effusions and associated dense passive atelectasis in the lower lobes.  IMPRESSION: 1. Support apparatus satisfactory. 2. New moderate CHF, with interstitial and airspace pulmonary edema and moderate-sized bilateral pleural effusions. Associated passive atelectasis in the lower lobes.  Electronically Signed   By: Hulan Saas M.D.   On: 05/08/2014 07:43   Dg Chest Port 1 View  05/07/2014   CLINICAL DATA:  Cardiac arrest, history of asthma  EXAM: PORTABLE CHEST - 1 VIEW  COMPARISON:  05/06/2014  FINDINGS: Cardiomediastinal silhouette is stable. Endotracheal tube in place with tip 4.5 cm above the carina. Stable NG tube position. Mild basilar atelectasis again noted. No segmental infiltrate or pulmonary edema  IMPRESSION: Stable support apparatus. Again noted mild basilar atelectasis. No segmental infiltrate or pulmonary edema.   Electronically Signed   By: Natasha Mead M.D.   On: 05/07/2014 11:22   Dg Chest Port 1 View  05/06/2014   CLINICAL DATA:  Respiratory failure  EXAM: PORTABLE CHEST - 1 VIEW  COMPARISON:  05/05/1949  FINDINGS: An endotracheal tube is again noted 2.8 cm above the carina. A nasogastric catheter is noted within the stomach. The cardiac shadow remains enlarged. Mild bibasilar atelectatic changes are seen. No significant vascular congestion is noted.  IMPRESSION: Mild bibasilar atelectatic changes.  Tubes and lines as described.   Electronically Signed   By: Alcide Clever M.D.   On: 05/06/2014 07:22   Dg Chest Port 1 View  05/05/2014   CLINICAL DATA:  Evaluate airspace disease  EXAM: PORTABLE CHEST - 1 VIEW  COMPARISON:  05/04/2014  FINDINGS: Endotracheal tube tip between the clavicular heads and carina. A gastric suction tube enters the stomach. Right subclavian central line, malpositioned on the previous study, has been removed.  Unchanged cardiomegaly.   Stable aortic contours.  Pulmonary venous congestion has decreased but there is now hazy and streaky bibasilar opacities. No pneumothorax.  IMPRESSION: 1. New orogastric tube is in good position. 2. Improved pulmonary edema. 3. Worsening basilar aeration, likely layering small effusions and atelectasis. Pneumonia cannot be excluded.   Electronically Signed   By: Tiburcio Pea M.D.   On: 05/05/2014 08:00   Dg Chest Portable 1 View  05/04/2014   CLINICAL DATA:  Status post cardiac arrest and CPR.  Intubated.  EXAM: PORTABLE CHEST - 1 VIEW  COMPARISON:  06/13/2010.  FINDINGS: Interval endotracheal tube with its tip 3.6 cm above the carina. Breast of enlargement of the cardiac silhouette with increased prominence of the pulmonary vasculature and interstitial markings. No pleural fluid seen. No pneumothorax. An interval epicardial pacemaker lead is in place. Right subclavian catheter extending into the right neck, its tip not included.  IMPRESSION: 1. Malpositioned right subclavian catheter extending into the neck on the right. 2. Progressive cardiomegaly and changes of congestive heart failure. These results will be called to the ordering clinician or representative by the Radiologist Assistant, and communication documented in the PACS or zVision Dashboard.   Electronically Signed   By: Gordan Payment M.D.   On: 05/04/2014 17:47   Dg Abd Portable 1v  05/04/2014   CLINICAL DATA:  Orogastric tube placement.  EXAM: PORTABLE ABDOMEN - 1 VIEW  COMPARISON:  08/11/2007.  FINDINGS: Orogastric tube tip in the mid to distal stomach. The visualized bowel gas pattern is normal. The patient is rotated to the right. Possible hiatal hernia and right basilar airspace opacity.  IMPRESSION: 1. Orogastric tube tip in the mid to distal stomach. 2. Possible hiatal hernia. 3. Mild patchy atelectasis or pneumonia at the right lung base.   Electronically Signed   By: Gordan Payment M.D.   On: 05/04/2014 21:39    Microbiology: No  results found for this or any previous visit (from the past  240 hour(s)).   Labs: Basic Metabolic Panel:  Recent Labs Lab 05/15/14 0500 05/16/14 0700 05/17/14 0357 05/18/14 0435 05/19/14 0221 05/20/14 1530 05/21/14 0508  NA 149* 147 142 143 141 142 137  K 3.6* 4.1 4.1 4.3 4.7 4.8 4.9  CL 105 107 103 107 107 106 103  CO2 29 28 22 23 21 21 20   GLUCOSE 120* 121* 106* 86 85 120* 138*  BUN 33* 35* 35* 29* 25* 22 23  CREATININE 1.80* 2.01* 1.96* 1.82* 1.84* 1.87* 1.89*  CALCIUM 9.2 8.8 8.4 8.1* 8.4 8.4 8.5  MG 2.7* 2.9*  --   --   --   --   --   PHOS 3.3  --   --   --   --   --   --    Liver Function Tests: No results for input(s): AST, ALT, ALKPHOS, BILITOT, PROT, ALBUMIN in the last 168 hours. No results for input(s): LIPASE, AMYLASE in the last 168 hours. No results for input(s): AMMONIA in the last 168 hours. CBC:  Recent Labs Lab 05/15/14 0500 05/16/14 0700 05/18/14 0435 05/19/14 0221 05/20/14 1530  WBC 14.8* 14.7* 13.4* 14.8* 15.3*  HGB 12.9* 11.5* 10.8* 11.2* 11.7*  HCT 40.4 36.2* 34.0* 35.0* 36.1*  MCV 84.0 82.3 81.7 82.2 81.9  PLT 279 280 314 340 287   Cardiac Enzymes: No results for input(s): CKTOTAL, CKMB, CKMBINDEX, TROPONINI in the last 168 hours. BNP: BNP (last 3 results) No results for input(s): PROBNP in the last 8760 hours. CBG:  Recent Labs Lab 05/20/14 0742 05/20/14 1139 05/20/14 1636 05/20/14 2037 05/21/14 0735  GLUCAP 95 184* 229* 102* 120*       Signed:  Markies Mowatt  [facility] 05/21/2014, 11:43 AM

## 2014-05-21 NOTE — Progress Notes (Signed)
Patient states the vicodin that was ordered has not helped his pain at pacemaker site. The site has surgical dressing , clean, dry and intact , no visible bruising redness or swelling noted. NP K.Schorr notified to request per patient something stronger than vicodin. Awaiting response. [facility] [employer]

## 2014-05-21 NOTE — Progress Notes (Signed)
Patient has been awake all night with couple times noted to be dozing. Did not stay asleep very long and states none of the pain medication tonight has taken the pain away. He has been very pleasant, alert and oriented with no delirium or agitation noted this shift. Oxy IR 10mg  ordered and given at this time. [facility]

## 2014-05-21 NOTE — Clinical Social Work Note (Addendum)
CSW made aware patient ready for d/c to [facility] by RN Mickel Baas. CSW contacted [facility] and confirmed bed availability with Dorian [facility]. CSW met with patient and sister who was present at bedside. Patient and sister are agreeable to d/c to [facility]. Patient asked CSW questions regarding SNF placement. CSW answered questions appropriately. CSW faxed d/c summary to facility and prepared d/c packet. CSW placed d/c packet in patient's shadow chart and provided RN with number for room and report. CSW to arrange transportation via [place]. No further needs. CSW signing off.   [place], [place] Weekend Clinical Social Worker [phone]

## 2014-05-21 NOTE — Progress Notes (Signed)
Pt states tylenol and tramadol is not controlling his pain from pacemaker surgery. Request MD be notified and request a stronger pain med. K.Knorr NP notified and awaiting response.

## 2014-05-21 NOTE — Progress Notes (Signed)
SUBJECTIVE: He did not sleep well last night.  Denies chest pain or shortness of breath.  Some productive cough of white phlegm. Some chills overnight.  Co pain t wound site  C/o pain overnight  S/p CRTD 05-19-14   CURRENT MEDICATIONS: . atorvastatin  10 mg Oral q1800  . carvedilol  12.5 mg Oral BID WC  . feeding supplement (ENSURE COMPLETE)  237 mL Oral BID BM  . furosemide  40 mg Oral BID  . glipiZIDE  10 mg Oral QAC breakfast  . insulin aspart  0-5 Units Subcutaneous QHS  . insulin aspart  0-9 Units Subcutaneous TID WC  . isosorbide-hydrALAZINE  2 tablet Oral BID  . levofloxacin  750 mg Oral Q48H  . nicotine  7 mg Transdermal Daily   . sodium chloride 10 mL/hr (05/19/14 2241)     OBJECTIVE: Physical Exam: Filed Vitals:   05/20/14 2000 05/21/14 0004 05/21/14 0400 05/21/14 0730  BP: 142/76 140/65 125/47 148/72  Pulse: 77 78 75 72  Temp: 98.3 F (36.8 C) 99.9 F (37.7 C) 99.1 F (37.3 C) 98.9 F (37.2 C)  TempSrc: Oral Oral Oral Oral  Resp: 21 28 18 16   Height:      Weight:   188 lb (85.276 kg)   SpO2: 96% 93% 94% 96%    Intake/Output Summary (Last 24 hours) at 05/21/14 0949 Last data filed at 05/21/14 6433  Gross per 24 hour  Intake    480 ml  Output   1840 ml  Net  -1360 ml    Telemetry reveals sinus rhythm with ventricular pacing  GEN- The patient is well appearing, no distress  Restless and confused Head- normocephalic, atraumatic Eyes-  Sclera clear, conjunctiva pink Ears- hearing intact Oropharynx- clear Neck- supple  Pocket without hematoma Lungs- Decreased breath sounds, normal work of breathing Heart- Regular rate and rhythm, no murmurs, rubs or gallops, PMI not laterally displaced GI- soft, NT, ND, + BS Extremities- no clubbing, cyanosis, or edema, s/p L AKA Skin- no rash or lesion, tegaderm over left ICD site   LABS: Basic Metabolic Panel:  Recent Labs  29/51/88 1530 05/21/14 0508  NA 142 137  K 4.8 4.9  CL 106 103  CO2 21  20  GLUCOSE 120* 138*  BUN 22 23  CREATININE 1.87* 1.89*  CALCIUM 8.4 8.5   CBC:  Recent Labs  05/19/14 0221 05/20/14 1530  WBC 14.8* 15.3*  HGB 11.2* 11.7*  HCT 35.0* 36.1*  MCV 82.2 81.9  PLT 340 287    RADIOLOGY: CXR pending this morning  ASSESSMENT AND PLAN:  Active Problems:   Chronic hepatitis C   DM2 (diabetes mellitus, type 2)   Essential hypertension   Peripheral vascular disease   Cardiac arrest   VF (ventricular fibrillation)   Nonischemic cardiomyopathy   Hx of AKA (above knee amputation)   Aspiration pneumonia   Acute delirium   Acute respiratory failure with hypoxia   Ventricular fibrillation   HCAP (healthcare-associated pneumonia)   MRSA pneumonia   Chronic hepatitis C without hepatic coma   Acute kidney injury   Other chest pain   Cardiac defibrillator in place   Fever presenting with conditions classified elsewhere  1.  VF arrest with non-obstructive CAD at cath S/p CRTD implant 05-19-14 CXR - official read pending, no obvious ptx Device interrogated and found to be functioning normally Pocket looks good  2.  NICM Euvolemic on exam Continue Coreg, Bidil  started low dose ace  Will  need to follow on rehab   3. Encephalopahty-- IMPROVED  4 POCKET PAIN   looks ok   Ok to discharge to home

## 2014-05-26 LAB — CULTURE, BLOOD (ROUTINE X 2)
Culture: NO GROWTH
Culture: NO GROWTH

## 2014-05-29 ENCOUNTER — Ambulatory Visit (INDEPENDENT_AMBULATORY_CARE_PROVIDER_SITE_OTHER): Payer: PRIVATE HEALTH INSURANCE | Admitting: *Deleted

## 2014-05-29 ENCOUNTER — Encounter: Payer: Self-pay | Admitting: Internal Medicine

## 2014-05-29 DIAGNOSIS — I469 Cardiac arrest, cause unspecified: Secondary | ICD-10-CM

## 2014-05-29 LAB — MDC_IDC_ENUM_SESS_TYPE_INCLINIC
Battery Remaining Longevity: 6.6
Brady Statistic AP VS Percent: 0.1 % — CL
Brady Statistic AS VP Percent: 97.6 %
Brady Statistic AS VS Percent: 1.5 %
HighPow Impedance: 57 Ohm
Lead Channel Impedance Value: 304 Ohm
Lead Channel Impedance Value: 418 Ohm
Lead Channel Impedance Value: 475 Ohm
Lead Channel Pacing Threshold Amplitude: 0.75 V
Lead Channel Pacing Threshold Amplitude: 1.75 V
Lead Channel Pacing Threshold Pulse Width: 0.4 ms
Lead Channel Pacing Threshold Pulse Width: 0.4 ms
Lead Channel Sensing Intrinsic Amplitude: 11.3 mV
Lead Channel Sensing Intrinsic Amplitude: 3 mV
Lead Channel Setting Pacing Amplitude: 3.5 V
Lead Channel Setting Pacing Amplitude: 3.5 V
Lead Channel Setting Pacing Amplitude: 4 V
Lead Channel Setting Pacing Pulse Width: 0.4 ms
Lead Channel Setting Sensing Sensitivity: 0.3 mV
MDC IDC MSMT LEADCHNL RA PACING THRESHOLD AMPLITUDE: 0.75 V
MDC IDC MSMT LEADCHNL RV PACING THRESHOLD PULSEWIDTH: 0.4 ms
MDC IDC SET LEADCHNL RV PACING PULSEWIDTH: 0.4 ms
MDC IDC STAT BRADY AP VP PERCENT: 0.9 %
Zone Setting Detection Interval: 250 ms
Zone Setting Detection Interval: 300 ms
Zone Setting Detection Interval: 350 ms
Zone Setting Detection Interval: 450 ms

## 2014-05-29 NOTE — Progress Notes (Signed)
Wound check-ICD 

## 2014-06-01 ENCOUNTER — Encounter (HOSPITAL_COMMUNITY): Payer: Self-pay | Admitting: Cardiovascular Disease

## 2014-06-09 ENCOUNTER — Other Ambulatory Visit: Payer: Self-pay | Admitting: Internal Medicine

## 2014-06-12 ENCOUNTER — Other Ambulatory Visit: Payer: Self-pay | Admitting: Internal Medicine

## 2014-07-12 ENCOUNTER — Other Ambulatory Visit: Payer: Self-pay | Admitting: Internal Medicine

## 2014-08-16 ENCOUNTER — Telehealth: Payer: Self-pay | Admitting: Interventional Cardiology

## 2014-08-16 NOTE — Telephone Encounter (Deleted)
error 

## 2014-08-22 ENCOUNTER — Ambulatory Visit (INDEPENDENT_AMBULATORY_CARE_PROVIDER_SITE_OTHER): Payer: Medicare Other | Admitting: Internal Medicine

## 2014-08-22 ENCOUNTER — Encounter: Payer: Self-pay | Admitting: Internal Medicine

## 2014-08-22 VITALS — BP 134/64 | HR 61 | Ht 68.0 in

## 2014-08-22 DIAGNOSIS — I428 Other cardiomyopathies: Secondary | ICD-10-CM

## 2014-08-22 DIAGNOSIS — I429 Cardiomyopathy, unspecified: Secondary | ICD-10-CM

## 2014-08-22 DIAGNOSIS — Z4502 Encounter for adjustment and management of automatic implantable cardiac defibrillator: Secondary | ICD-10-CM

## 2014-08-22 DIAGNOSIS — I469 Cardiac arrest, cause unspecified: Secondary | ICD-10-CM

## 2014-08-22 DIAGNOSIS — I4901 Ventricular fibrillation: Secondary | ICD-10-CM

## 2014-08-22 LAB — MDC_IDC_ENUM_SESS_TYPE_INCLINIC
Battery Remaining Longevity: 96 mo
Brady Statistic AS VS Percent: 2.07 %
Brady Statistic RA Percent Paced: 0.84 %
Brady Statistic RV Percent Paced: 96.29 %
HIGH POWER IMPEDANCE MEASURED VALUE: 228 Ohm
HighPow Impedance: 63 Ohm
Lead Channel Impedance Value: 646 Ohm
Lead Channel Pacing Threshold Amplitude: 0.75 V
Lead Channel Pacing Threshold Amplitude: 0.875 V
Lead Channel Pacing Threshold Pulse Width: 0.4 ms
Lead Channel Sensing Intrinsic Amplitude: 14.875 mV
Lead Channel Sensing Intrinsic Amplitude: 18.125 mV
Lead Channel Sensing Intrinsic Amplitude: 2.875 mV
Lead Channel Setting Pacing Amplitude: 2 V
Lead Channel Setting Pacing Amplitude: 2.25 V
Lead Channel Setting Pacing Amplitude: 2.5 V
Lead Channel Setting Pacing Pulse Width: 0.4 ms
Lead Channel Setting Pacing Pulse Width: 0.4 ms
MDC IDC MSMT BATTERY VOLTAGE: 3.04 V
MDC IDC MSMT LEADCHNL LV PACING THRESHOLD PULSEWIDTH: 0.4 ms
MDC IDC MSMT LEADCHNL RA IMPEDANCE VALUE: 456 Ohm
MDC IDC MSMT LEADCHNL RA SENSING INTR AMPL: 2.75 mV
MDC IDC MSMT LEADCHNL RV PACING THRESHOLD AMPLITUDE: 0.5 V
MDC IDC MSMT LEADCHNL RV PACING THRESHOLD PULSEWIDTH: 0.4 ms
MDC IDC SESS DTM: 20160301161822
MDC IDC SET LEADCHNL RV SENSING SENSITIVITY: 0.3 mV
MDC IDC STAT BRADY AP VP PERCENT: 0.81 %
MDC IDC STAT BRADY AP VS PERCENT: 0.03 %
MDC IDC STAT BRADY AS VP PERCENT: 97.09 %
Zone Setting Detection Interval: 250 ms
Zone Setting Detection Interval: 300 ms
Zone Setting Detection Interval: 350 ms
Zone Setting Detection Interval: 450 ms

## 2014-08-22 NOTE — Telephone Encounter (Signed)
ERROR

## 2014-08-22 NOTE — Progress Notes (Signed)
Patient Care Team: Quitman Livings, MD as PCP - General (Internal Medicine)   HPI  Jonathon Galvan is a 69 y.o. male Seen in followup for CRT D implantation following hospitalization for aborted cardiac arrest in the context of nonischemic myopathy left bundle branch block.  I gather he had Previously refused ICD implantation.  He denies chest pain or shortness of breath; these had no edema syncope or palpitations.  He does have a problem with significant diarrhea which started after he left the hospital.   Ejection fraction 11/15 was 30-35% 20 11 was 30%      Past Medical History  Diagnosis Date  . CHF (congestive heart failure)     EF 30-35%, nonischemic  . CAD (coronary artery disease)   . Hyperlipidemia     hypertriglyceridemia on niaspan and lipitor.  . Asthma   . Allergy   . PVD (peripheral vascular disease)     s/p left AKA @ [facility] (MRSA septic arthritis)  . Diabetes mellitus     dx in 2009, DKA, came unresponsive  . Tobacco abuse   . Hepatitis C   . Hypertension   . LBBB (left bundle branch block)   . Anemia of chronic disease     baseline Hgb now 13  . Thrombocytopenia     07/2007, went down to 90,000. likely 2/2 neurontin.  . Cocaine use     s/p cardiopulmonary arrest 12/25/2002 ([facility])  . Cholelithiasis     documented on CT abdomen in 07/2007  . Non-ischemic cardiomyopathy 04/2014    BiV-ICD placed    Past Surgical History  Procedure Laterality Date  . Left leg mrsa infection s/p aka    . Left shoulder cyst removal    . Biv-icd (crt-d)  05/19/14    Medtronic Quattro  . Left heart catheterization with coronary angiogram N/A 05/04/2014    Procedure: LEFT HEART CATHETERIZATION WITH CORONARY ANGIOGRAM;  Surgeon: Micheline Chapman, MD;  Location: [facility] CATH LAB;  Service: Cardiovascular;  Laterality: N/A;  . Bi-ventricular implantable cardioverter defibrillator N/A 05/19/2014    Procedure: BI-VENTRICULAR IMPLANTABLE CARDIOVERTER DEFIBRILLATOR   (CRT-D);  Surgeon: Duke Salvia, MD;  Location[facility] C[facility] LAB;  Service: Cardiovascular;  Laterality: N/A;    Current Outpatient Prescriptions  Medication Sig Dispense Refill  . aspirin EC 81 MG tablet Take 1 tablet (81 mg total) by mouth daily. 30 tablet 0  . atorvastatin (LIPITOR) 10 MG tablet Take 1 tablet (10 mg total) by mouth daily. 30 tablet 5  . bacitracin ointment Apply 1 application topically 2 (two) times daily. 120 g 0  . carvedilol (COREG) 12.5 MG tablet Take 2 tablets (25 mg total) by mouth 2 (two) times daily with a meal. 30 tablet 0  . cetirizine (ZYRTEC) 10 MG tablet Take 1 tablet (10 mg total) by mouth daily. 30 tablet 5  . enalapril (VASOTEC) 2.5 MG tablet Take 1 tablet (2.5 mg total) by mouth daily. 30 tablet 0  . furosemide (LASIX) 80 MG tablet Take 0.5 tablets (40 mg total) by mouth 2 (two) times daily. 60 tablet 11  . glipiZIDE (GLUCOTROL) 10 MG tablet Take 1 tablet by mouth daily before breakfast.     . isosorbide-hydrALAZINE (BIDIL) 20-37.5 MG per tablet Take 2 tablets by mouth 2 (two) times daily. 120 tablet 0  . metFORMIN (GLUCOPHAGE) 1000 MG tablet Take 1 tablet (1,000 mg total) by mouth 2 (two) times daily with a meal. (Patient taking differently: Take  1,000 mg by mouth daily with breakfast. ) 60 tablet 11  . niacin (NIASPAN) 500 MG CR tablet Take 1 tablet by mouth daily.    . nicotine (NICODERM CQ - DOSED IN MG/24 HR) 7 mg/24hr patch Place 1 patch (7 mg total) onto the skin daily. 28 patch 0  . oxyCODONE-acetaminophen (PERCOCET) 10-325 MG per tablet Take 1 tablet by mouth every 8 (eight) hours as needed for pain. 30 tablet 0  . traMADol-acetaminophen (ULTRACET) 37.5-325 MG per tablet Take 1 tablet by mouth every 6 (six) hours as needed for moderate pain. 30 tablet 0  . venlafaxine (EFFEXOR) 50 MG tablet Take 1 tablet (50 mg total) by mouth at bedtime. 30 tablet 5   No current facility-administered medications for this visit.    No Known Allergies  Review of  Systems negative except from HPI and PMH  Physical Exam BP 134/64 mmHg  Pulse 61  Ht 5\' 8"  (1.727 m)  Wt  Well developed and well nourished in no acute distress HENT normal E scleral and icterus clear Neck Supple JVP flat; carotids brisk and full Clear to ausculation  Device pocket well healed; without hematoma or erythema.  There is no tethering Regular rate and rhythm, no murmurs gallops or rub Soft with active bowel sounds No clubbing cyanosis  Edema  status post right AKA Alert and oriented, grossly normal motor and sensory function Skin Warm and Dry  ECG demonstrates sinus rhythm with pacemaker is pacing at a rate of 61  Assessment and  Plan  Nonischemic cardio myopathy  Aborted cardiac arrest  Diarrhea  Hypertension  Implantable defibrillator-Medtronic The patient's device was interrogated and the information was fully reviewed.  The device was reprogrammed to  maximize longevity  He is euvolemic. We will continue him on his afterload reduction. I wonder whether his diarrhea is being caused by his carvedilol. We will undertake a three-week withdrawal trial. In the event that the diarrhea abates we'll be to try alternative beta-blockade with either metoprolol succinate or bisoprolol. The event that the diarrhea persists, we would resume the carvedilol. He is to see his PCP later this week and I will defer further management of this to him.  His blood pressure is relatively controlled. I expect will have a little bit of a problem with that as we undertake the beta blocker withdrawal trial above.  I will have him come back and see one of the PAs in 3 months for ongoing management of his cardiomyopathy.

## 2014-08-22 NOTE — Patient Instructions (Addendum)
Your physician has recommended you make the following change in your medication:  1)  Stop Carvedilol by the following:  DECREASE Carvedilol to 6.25 mg twice daily for 3 days, then decrease it to 6.25 mg daily for 3 days, then stop medication  Remote monitoring is used to monitor your ICD from home. This monitoring reduces the number of office visits required to check your device to one time per year. It allows [place] to keep an eye on the functioning of your device to ensure it is working properly. You are scheduled for a device check from home on 11-21-2014. You may send your transmission at any time that day. If you have a wireless device, the transmission will be sent automatically. After your physician reviews your transmission, you will receive a postcard with your next transmission date.  Your physician recommends that you schedule a follow-up appointment in: November 2016 with Dr.Klein

## 2014-09-11 ENCOUNTER — Encounter: Payer: Self-pay | Admitting: Internal Medicine

## 2014-09-27 ENCOUNTER — Other Ambulatory Visit: Payer: Self-pay | Admitting: Internal Medicine

## 2014-11-07 ENCOUNTER — Telehealth: Payer: Self-pay | Admitting: Internal Medicine

## 2014-11-07 NOTE — Telephone Encounter (Signed)
New message       Pt is enrolled in the heart failure program.  They mailed him a scale and a tablet to answer heart failure questions.  Want Dr Graciela Husbands to be aware.

## 2014-11-11 ENCOUNTER — Emergency Department (HOSPITAL_COMMUNITY): Payer: Medicare Other

## 2014-11-11 ENCOUNTER — Observation Stay (HOSPITAL_COMMUNITY)
Admission: EM | Admit: 2014-11-11 | Discharge: 2014-11-12 | Disposition: A | Discharge status: Home / Self Care | Payer: Medicare Other | Attending: Internal Medicine | Admitting: Internal Medicine

## 2014-11-11 ENCOUNTER — Encounter (HOSPITAL_COMMUNITY): Payer: Self-pay

## 2014-11-11 DIAGNOSIS — Z7982 Long term (current) use of aspirin: Secondary | ICD-10-CM | POA: Diagnosis not present

## 2014-11-11 DIAGNOSIS — R0602 Shortness of breath: Secondary | ICD-10-CM

## 2014-11-11 DIAGNOSIS — E119 Type 2 diabetes mellitus without complications: Secondary | ICD-10-CM | POA: Diagnosis not present

## 2014-11-11 DIAGNOSIS — T402X1A Poisoning by other opioids, accidental (unintentional), initial encounter: Secondary | ICD-10-CM | POA: Diagnosis not present

## 2014-11-11 DIAGNOSIS — Z95810 Presence of automatic (implantable) cardiac defibrillator: Secondary | ICD-10-CM | POA: Insufficient documentation

## 2014-11-11 DIAGNOSIS — I429 Cardiomyopathy, unspecified: Secondary | ICD-10-CM | POA: Insufficient documentation

## 2014-11-11 DIAGNOSIS — T40601A Poisoning by unspecified narcotics, accidental (unintentional), initial encounter: Secondary | ICD-10-CM | POA: Diagnosis not present

## 2014-11-11 DIAGNOSIS — F17210 Nicotine dependence, cigarettes, uncomplicated: Secondary | ICD-10-CM | POA: Diagnosis not present

## 2014-11-11 DIAGNOSIS — I2510 Atherosclerotic heart disease of native coronary artery without angina pectoris: Secondary | ICD-10-CM | POA: Insufficient documentation

## 2014-11-11 DIAGNOSIS — I739 Peripheral vascular disease, unspecified: Secondary | ICD-10-CM | POA: Insufficient documentation

## 2014-11-11 DIAGNOSIS — J45909 Unspecified asthma, uncomplicated: Secondary | ICD-10-CM | POA: Insufficient documentation

## 2014-11-11 DIAGNOSIS — I10 Essential (primary) hypertension: Secondary | ICD-10-CM | POA: Diagnosis not present

## 2014-11-11 DIAGNOSIS — Z79891 Long term (current) use of opiate analgesic: Secondary | ICD-10-CM | POA: Diagnosis not present

## 2014-11-11 DIAGNOSIS — E785 Hyperlipidemia, unspecified: Secondary | ICD-10-CM | POA: Insufficient documentation

## 2014-11-11 DIAGNOSIS — G92 Toxic encephalopathy: Secondary | ICD-10-CM | POA: Insufficient documentation

## 2014-11-11 DIAGNOSIS — I509 Heart failure, unspecified: Secondary | ICD-10-CM | POA: Diagnosis not present

## 2014-11-11 DIAGNOSIS — B1920 Unspecified viral hepatitis C without hepatic coma: Secondary | ICD-10-CM | POA: Insufficient documentation

## 2014-11-11 DIAGNOSIS — Z89612 Acquired absence of left leg above knee: Secondary | ICD-10-CM | POA: Insufficient documentation

## 2014-11-11 LAB — CBC WITH DIFFERENTIAL/PLATELET
Basophils Absolute: 0.1 10*3/uL (ref 0.0–0.1)
Basophils Relative: 0 % (ref 0–1)
Eosinophils Absolute: 0.4 10*3/uL (ref 0.0–0.7)
Eosinophils Relative: 3 % (ref 0–5)
HEMATOCRIT: 40.4 % (ref 39.0–52.0)
Hemoglobin: 12.9 g/dL — ABNORMAL LOW (ref 13.0–17.0)
LYMPHS ABS: 2.1 10*3/uL (ref 0.7–4.0)
LYMPHS PCT: 14 % (ref 12–46)
MCH: 26.3 pg (ref 26.0–34.0)
MCHC: 31.9 g/dL (ref 30.0–36.0)
MCV: 82.3 fL (ref 78.0–100.0)
Monocytes Absolute: 0.7 10*3/uL (ref 0.1–1.0)
Monocytes Relative: 5 % (ref 3–12)
NEUTROS ABS: 11.6 10*3/uL — AB (ref 1.7–7.7)
Neutrophils Relative %: 78 % — ABNORMAL HIGH (ref 43–77)
Platelets: 283 10*3/uL (ref 150–400)
RBC: 4.91 MIL/uL (ref 4.22–5.81)
RDW: 14.8 % (ref 11.5–15.5)
WBC: 14.9 10*3/uL — ABNORMAL HIGH (ref 4.0–10.5)

## 2014-11-11 LAB — COMPREHENSIVE METABOLIC PANEL
ALK PHOS: 58 U/L (ref 38–126)
ALT: 16 U/L — ABNORMAL LOW (ref 17–63)
AST: 23 U/L (ref 15–41)
Albumin: 3 g/dL — ABNORMAL LOW (ref 3.5–5.0)
Anion gap: 12 (ref 5–15)
BILIRUBIN TOTAL: 0.2 mg/dL — AB (ref 0.3–1.2)
BUN: 20 mg/dL (ref 6–20)
CO2: 22 mmol/L (ref 22–32)
CREATININE: 2.01 mg/dL — AB (ref 0.61–1.24)
Calcium: 8.4 mg/dL — ABNORMAL LOW (ref 8.9–10.3)
Chloride: 106 mmol/L (ref 101–111)
GFR calc Af Amer: 37 mL/min — ABNORMAL LOW (ref >60)
GFR calc non Af Amer: 32 mL/min — ABNORMAL LOW (ref >60)
Glucose, Bld: 139 mg/dL — ABNORMAL HIGH (ref 65–99)
Potassium: 3.9 mmol/L (ref 3.5–5.1)
Sodium: 140 mmol/L (ref 135–145)
Total Protein: 6.9 g/dL (ref 6.5–8.1)

## 2014-11-11 LAB — CK: CK TOTAL: 309 U/L (ref 49–397)

## 2014-11-11 LAB — ACETAMINOPHEN LEVEL: Acetaminophen (Tylenol), Serum: <10 ug/mL — ABNORMAL LOW (ref 10–30)

## 2014-11-11 LAB — TROPONIN I: TROPONIN I: 0.03 ng/mL (ref <0.031)

## 2014-11-11 LAB — BRAIN NATRIURETIC PEPTIDE: B Natriuretic Peptide: 636.9 pg/mL — ABNORMAL HIGH (ref 0.0–100.0)

## 2014-11-11 MED ORDER — FUROSEMIDE 40 MG PO TABS: 40.0000 mg | ORAL_TABLET | Freq: Two times a day (BID) | ORAL | Status: DC

## 2014-11-11 MED ORDER — HEPARIN SODIUM (PORCINE) 5000 UNIT/ML IJ SOLN
5000.0000 [IU] | Freq: Three times a day (TID) | INTRAMUSCULAR | Status: DC
  Filled 2014-11-11 (×5): qty 1

## 2014-11-11 MED ORDER — TRAMADOL-ACETAMINOPHEN 37.5-325 MG PO TABS: 1.0000 | ORAL_TABLET | Freq: Four times a day (QID) | ORAL | Status: DC | PRN

## 2014-11-11 MED ORDER — LORATADINE 10 MG PO TABS
10.0000 mg | ORAL_TABLET | Freq: Every day | ORAL | Status: DC
  Filled 2014-11-11: qty 1

## 2014-11-11 MED ORDER — OXYCODONE HCL 5 MG PO TABS: 5.0000 mg | ORAL_TABLET | Freq: Three times a day (TID) | ORAL | Status: DC | PRN

## 2014-11-11 MED ORDER — GABAPENTIN 300 MG PO CAPS
300.0000 mg | ORAL_CAPSULE | Freq: Three times a day (TID) | ORAL | Status: DC
  Filled 2014-11-11 (×3): qty 1

## 2014-11-11 MED ORDER — CARVEDILOL 25 MG PO TABS
25.0000 mg | ORAL_TABLET | Freq: Two times a day (BID) | ORAL | Status: DC
Start: 2014-11-11 — End: 2014-11-12
  Filled 2014-11-11 (×5): qty 1

## 2014-11-11 MED ORDER — FUROSEMIDE 10 MG/ML IJ SOLN
80.0000 mg | Freq: Once | INTRAMUSCULAR | Status: AC
  Administered 2014-11-11: 80 mg via INTRAVENOUS
  Filled 2014-11-11: qty 8

## 2014-11-11 MED ORDER — ENALAPRIL MALEATE 2.5 MG PO TABS
2.5000 mg | ORAL_TABLET | Freq: Every day | ORAL | Status: DC
  Filled 2014-11-11 (×2): qty 1

## 2014-11-11 MED ORDER — ASPIRIN EC 81 MG PO TBEC
81.0000 mg | DELAYED_RELEASE_TABLET | Freq: Every day | ORAL | Status: DC
  Filled 2014-11-11 (×2): qty 1

## 2014-11-11 MED ORDER — ZOLPIDEM TARTRATE 10 MG PO TABS
10.0000 mg | ORAL_TABLET | Freq: Every evening | ORAL | Status: DC | PRN
Start: 2014-11-11 — End: 2014-11-11

## 2014-11-11 MED ORDER — SODIUM CHLORIDE 0.9 % IJ SOLN: 3.0000 mL | Freq: Two times a day (BID) | INTRAMUSCULAR | Status: DC

## 2014-11-11 MED ORDER — ATORVASTATIN CALCIUM 10 MG PO TABS
10.0000 mg | ORAL_TABLET | Freq: Every day | ORAL | Status: DC
  Filled 2014-11-11 (×2): qty 1

## 2014-11-11 MED ORDER — AMITRIPTYLINE HCL 100 MG PO TABS
100.0000 mg | ORAL_TABLET | Freq: Every day | ORAL | Status: DC
  Filled 2014-11-11: qty 1

## 2014-11-11 MED ORDER — FUROSEMIDE 40 MG PO TABS
40.0000 mg | ORAL_TABLET | Freq: Two times a day (BID) | ORAL | Status: DC
  Administered 2014-11-12: 40 mg via ORAL
  Filled 2014-11-11 (×3): qty 1

## 2014-11-11 MED ORDER — OXYCODONE-ACETAMINOPHEN 10-325 MG PO TABS: 1.0000 | ORAL_TABLET | Freq: Three times a day (TID) | ORAL | Status: DC | PRN

## 2014-11-11 MED ORDER — SODIUM CHLORIDE 0.9 % IV SOLN
INTRAVENOUS | Status: DC
  Administered 2014-11-11: 20:00:00 via INTRAVENOUS

## 2014-11-11 MED ORDER — NIACIN ER (ANTIHYPERLIPIDEMIC) 500 MG PO TBCR
500.0000 mg | EXTENDED_RELEASE_TABLET | Freq: Every day | ORAL | Status: DC
  Filled 2014-11-11 (×2): qty 1

## 2014-11-11 MED ORDER — OXYCODONE-ACETAMINOPHEN 5-325 MG PO TABS: 1.0000 | ORAL_TABLET | Freq: Three times a day (TID) | ORAL | Status: DC | PRN

## 2014-11-11 MED ORDER — METFORMIN HCL 500 MG PO TABS
1000.0000 mg | ORAL_TABLET | Freq: Two times a day (BID) | ORAL | Status: DC
  Administered 2014-11-12: 1000 mg via ORAL
  Filled 2014-11-11 (×3): qty 2

## 2014-11-11 MED ORDER — GLIPIZIDE 10 MG PO TABS
10.0000 mg | ORAL_TABLET | Freq: Every day | ORAL | Status: DC
  Filled 2014-11-11 (×3): qty 1

## 2014-11-11 MED ORDER — ZOLPIDEM TARTRATE 5 MG PO TABS: 5.0000 mg | ORAL_TABLET | Freq: Every evening | ORAL | Status: DC | PRN

## 2014-11-11 MED ORDER — ISOSORB DINITRATE-HYDRALAZINE 20-37.5 MG PO TABS
2.0000 | ORAL_TABLET | Freq: Two times a day (BID) | ORAL | Status: DC
  Filled 2014-11-11 (×3): qty 2

## 2014-11-11 NOTE — ED Notes (Signed)
Pt continues to deny SOB 

## 2014-11-11 NOTE — ED Notes (Signed)
Dr. Gardner at bedside 

## 2014-11-11 NOTE — ED Notes (Signed)
Pt resting comfortably in bed.  RR easy and unlabored. O2 sat 94% RA.

## 2014-11-11 NOTE — ED Notes (Signed)
Bed: WG95 Expected date: 11/11/14 Expected time:  Means of arrival:  Comments: EMS possible unresponsive

## 2014-11-11 NOTE — H&P (Signed)
[facility] History and Physical  Jonathon Galvan PIR:[id number] DOB: 26-Feb-1946 DOA: 11/11/2014  Referring physician: EDP PCP: Quitman Livings, MD   Chief Complaint: AMS   HPI: Jonathon Galvan is a 69 y.o. male brought to ED after being found on floor by neighbor.  Patient was initially unresponsive but became much improved after receiving narcan.  Patient admits to taking extra oxycodone today due to his leg stump pain.  Patient took 3 tablets of 10/325 percocet.  Was not an attempt at self harm.  Review of Systems: Systems reviewed.  As above, otherwise negative  Past Medical History  Diagnosis Date  . CHF (congestive heart failure)     EF 30-35%, nonischemic  . CAD (coronary artery disease)   . Hyperlipidemia     hypertriglyceridemia on niaspan and lipitor.  . Asthma   . Allergy   . PVD (peripheral vascular disease)     s/p left AKA @ [facility] (MRSA septic arthritis)  . Diabetes mellitus     dx in 2009, DKA, came unresponsive  . Tobacco abuse   . Hepatitis C   . Hypertension   . LBBB (left bundle branch block)   . Anemia of chronic disease     baseline Hgb now 13  . Thrombocytopenia     07/2007, went down to 90,000. likely 2/2 neurontin.  . Cocaine use     s/p cardiopulmonary arrest 12/25/2002 ([facility])  . Cholelithiasis     documented on CT abdomen in 07/2007  . Non-ischemic cardiomyopathy 04/2014    BiV-ICD placed   Past Surgical History  Procedure Laterality Date  . Left leg mrsa infection s/p aka    . Left shoulder cyst removal    . Biv-icd (crt-d)  05/19/14    Medtronic Quattro  . Left heart catheterization with coronary angiogram N/A 05/04/2014    Procedure: LEFT HEART CATHETERIZATION WITH CORONARY ANGIOGRAM;  Surgeon: Micheline Chapman, MD;  Location: [facility] CATH LAB;  Service: Cardiovascular;  Laterality: N/A;  . Bi-ventricular implantable cardioverter defibrillator N/A 05/19/2014    Procedure: BI-VENTRICULAR IMPLANTABLE CARDIOVERTER DEFIBRILLATOR   (CRT-D);  Surgeon: Duke Salvia, MD;  Location: [facility] CATH LAB;  Service: Cardiovascular;  Laterality: N/A;   Social History:  reports that he has been smoking.  He does not have any smokeless tobacco history on file. He reports that he does not drink alcohol or use illicit drugs.  No Known Allergies  Family History  Problem Relation Age of Onset  . Coronary artery disease Mother     died in her 83's  . Cirrhosis Father     died from complications  . Coronary artery disease Brother     died of fatal MI     Prior to Admission medications   Medication Sig Start Date End Date Taking? Authorizing Provider  amitriptyline (ELAVIL) 100 MG tablet Take 100 mg by mouth at bedtime.   Yes Historical Provider, MD  aspirin EC 81 MG tablet Take 1 tablet (81 mg total) by mouth daily. 05/21/14  Yes Nishant Dhungel, MD  atorvastatin (LIPITOR) 10 MG tablet Take 1 tablet (10 mg total) by mouth daily. 10/03/11  Yes Lars Mage, MD  carvedilol (COREG) 12.5 MG tablet Take 2 tablets (25 mg total) by mouth 2 (two) times daily with a meal. 05/21/14  Yes Nishant Dhungel, MD  cetirizine (ZYRTEC) 10 MG tablet Take 1 tablet (10 mg total) by mouth daily. 10/03/11  Yes Lars Mage, MD  enalapril (VASOTEC) 2.5 MG tablet Take  1 tablet (2.5 mg total) by mouth daily. 05/21/14  Yes Nishant Dhungel, MD  furosemide (LASIX) 80 MG tablet Take 0.5 tablets (40 mg total) by mouth 2 (two) times daily. 05/21/14  Yes Nishant Dhungel, MD  gabapentin (NEURONTIN) 300 MG capsule Take 300 mg by mouth 3 (three) times daily.   Yes Historical Provider, MD  glipiZIDE (GLUCOTROL) 10 MG tablet Take 1 tablet by mouth daily before breakfast.  08/11/13  Yes Historical Provider, MD  isosorbide-hydrALAZINE (BIDIL) 20-37.5 MG per tablet Take 2 tablets by mouth 2 (two) times daily. 05/21/14  Yes Nishant Dhungel, MD  metFORMIN (GLUCOPHAGE) 1000 MG tablet Take 1 tablet (1,000 mg total) by mouth 2 (two) times daily with a meal. 05/14/11  Yes Lars Mage, MD   niacin (NIASPAN) 500 MG CR tablet Take 500 mg by mouth at bedtime.  08/11/13  Yes Historical Provider, MD  oxyCODONE-acetaminophen (PERCOCET) 10-325 MG per tablet Take 1 tablet by mouth every 8 (eight) hours as needed for pain. 05/21/14  Yes Nishant Dhungel, MD  traMADol-acetaminophen (ULTRACET) 37.5-325 MG per tablet Take 1 tablet by mouth every 6 (six) hours as needed for moderate pain. 05/21/14  Yes Nishant Dhungel, MD  venlafaxine (EFFEXOR) 50 MG tablet Take 1 tablet (50 mg total) by mouth at bedtime. 10/03/11  Yes Lars Mage, MD  zolpidem (AMBIEN) 10 MG tablet Take 10 mg by mouth at bedtime as needed for sleep.   Yes Historical Provider, MD   Physical Exam: Filed Vitals:   11/11/14 2100  BP: 163/82  Pulse: 75  Temp:   Resp: 22    BP 163/82 mmHg  Pulse 75  Temp(Src) 98.7 F (37.1 C) (Oral)  Resp 22  SpO2 100%  General Appearance:    Sleepy, oriented, no distress, appears stated age  Head:    Normocephalic, atraumatic  Eyes:    PERRL, EOMI, sclera non-icteric        Nose:   Nares without drainage or epistaxis. Mucosa, turbinates normal  Throat:   Moist mucous membranes. Oropharynx without erythema or exudate.  Neck:   Supple. No carotid bruits.  No thyromegaly.  No lymphadenopathy.   Back:     No CVA tenderness, no spinal tenderness  Lungs:     Clear to auscultation bilaterally, without wheezes, rhonchi or rales  Chest wall:    No tenderness to palpitation  Heart:    Regular rate and rhythm without murmurs, gallops, rubs  Abdomen:     Soft, non-tender, nondistended, normal bowel sounds, no organomegaly  Genitalia:    deferred  Rectal:    deferred  Extremities:   No clubbing, cyanosis or edema.  Pulses:   2+ and symmetric all extremities  Skin:   Skin color, texture, turgor normal, no rashes or lesions  Lymph nodes:   Cervical, supraclavicular, and axillary nodes normal  Neurologic:   CNII-XII intact. Normal strength, sensation and reflexes      throughout    Labs on  Admission:  Basic Metabolic Panel:  Recent Labs Lab 11/11/14 1701  NA 140  K 3.9  CL 106  CO2 22  GLUCOSE 139*  BUN 20  CREATININE 2.01*  CALCIUM 8.4*   Liver Function Tests:  Recent Labs Lab 11/11/14 1701  AST 23  ALT 16*  ALKPHOS 58  BILITOT 0.2*  PROT 6.9  ALBUMIN 3.0*   No results for input(s): LIPASE, AMYLASE in the last 168 hours. No results for input(s): AMMONIA in the last 168 hours. CBC:  Recent Labs Lab  11/11/14 1701  WBC 14.9*  NEUTROABS 11.6*  HGB 12.9*  HCT 40.4  MCV 82.3  PLT 283   Cardiac Enzymes:  Recent Labs Lab 11/11/14 1701  CKTOTAL 309  TROPONINI 0.03    BNP (last 3 results) No results for input(s): PROBNP in the last 8760 hours. CBG: No results for input(s): GLUCAP in the last 168 hours.  Radiological Exams on Admission: Dg Chest 2 View  11/11/2014   CLINICAL DATA:  Shortness of Breath  EXAM: CHEST  2 VIEW  COMPARISON:  05/20/2014  FINDINGS: Left AICD remains in place, unchanged. There is cardiomegaly with vascular congestion. No overt edema. No confluent opacities or effusions. No acute bony abnormality.  IMPRESSION: Cardiomegaly with vascular congestion.   Electronically Signed   By: Charlett Nose M.D.   On: 11/11/2014 17:25    EKG: Independently reviewed.  Assessment/Plan Active Problems:   Opiate overdose   1. Opiate overdose - patient still sleepy but breathing well at this point, wakes up to voice, will admit for overnight observation.    Code Status: Full  Family Communication: No family in room Disposition Plan: Admit to obs  Time spent: 30 min  GARDNER, JARED M. [facility] Pager [phone]  If 7AM-7PM, please contact the day team taking care of the patient [email or web address] Password [email or web address] 11/11/2014, 10:46 PM
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
## 2014-11-11 NOTE — ED Notes (Signed)
He is in no distress and is drinking diet pop per his request and that Dr. Freida Busman ok'd.  He is 100% paced in V.A.T. Mode at present.

## 2014-11-12 DIAGNOSIS — G9340 Encephalopathy, unspecified: Secondary | ICD-10-CM | POA: Diagnosis not present

## 2014-11-12 DIAGNOSIS — T402X1A Poisoning by other opioids, accidental (unintentional), initial encounter: Secondary | ICD-10-CM | POA: Diagnosis not present

## 2014-11-12 MED ORDER — ACETAMINOPHEN 325 MG PO TABS: 650.0000 mg | ORAL_TABLET | Freq: Once | ORAL | Status: DC

## 2014-11-12 MED ORDER — OXYCODONE-ACETAMINOPHEN 10-325 MG PO TABS: 1.0000 | ORAL_TABLET | Freq: Two times a day (BID) | ORAL | Status: AC | PRN

## 2014-11-12 NOTE — Progress Notes (Signed)
11/12/14  1130  Reviewed discharge instructions with patient. Patient verbalized understanding. Copy of discharge instructions given to patient.

## 2014-11-12 NOTE — Discharge Summary (Signed)
PATIENT DETAILS Name: Jonathon Galvan Age: 69 y.o. Sex: male Date of Birth: 07-Dec-1945 MRN: [id number]. Admitting Physician: Jared M Gardner, DO WUJ:[id number],NWGN, MD  Admit Date: 11/11/2014 Discharge date: 11/12/2014  Recommendations for Outpatient Follow-up:  1. Need to minimize narcotics as much as possible. 2. Repeat chemistries at next visit.  PRIMARY DISCHARGE DIAGNOSIS:  Active Problems:   Opiate overdose      PAST MEDICAL HISTORY: Past Medical History  Diagnosis Date  . CHF (congestive heart failure)     EF 30-35%, nonischemic  . CAD (coronary artery disease)   . Hyperlipidemia     hypertriglyceridemia on niaspan and lipitor.  . Asthma   . Allergy   . PVD (peripheral vascular disease)     s/p left AKA @ [facility] (MRSA septic arthritis)  . Diabetes mellitus     dx in 2009, DKA, came unresponsive  . Tobacco abuse   . Hepatitis C   . Hypertension   . LBBB (left bundle branch block)   . Anemia of chronic disease     baseline Hgb now 13  . Thrombocytopenia     07/2007, went down to 90,000. likely 2/2 neurontin.  . Cocaine use     s/p cardiopulmonary arrest 12/25/2002 ([facility])  . Cholelithiasis     documented on CT abdomen in 07/2007  . Non-ischemic cardiomyopathy 04/2014    BiV-ICD placed    DISCHARGE MEDICATIONS: Discharge Medication List as of 11/12/2014 11:15 AM    CONTINUE these medications which have CHANGED   Details  oxyCODONE-acetaminophen (PERCOCET) 10-325 MG per tablet Take 1 tablet by mouth every 12 (twelve) hours as needed for pain., Starting 11/12/2014, Until Discontinued, No Print      CONTINUE these medications which have NOT CHANGED   Details  amitriptyline (ELAVIL) 100 MG tablet Take 100 mg by mouth at bedtime., Until Discontinued, Historical Med    aspirin EC 81 MG tablet Take 1 tablet (81 mg total) by mouth daily., Starting 05/21/2014, Until Discontinued, Normal    atorvastatin (LIPITOR) 10 MG tablet Take 1 tablet (10 mg total) by  mouth daily., Starting 10/03/2011, Until Discontinued, Normal    carvedilol (COREG) 12.5 MG tablet Take 2 tablets (25 mg total) by mouth 2 (two) times daily with a meal., Starting 05/21/2014, Until Discontinued, Normal    cetirizine (ZYRTEC) 10 MG tablet Take 1 tablet (10 mg total) by mouth daily., Starting 10/03/2011, Until Discontinued, Normal    enalapril (VASOTEC) 2.5 MG tablet Take 1 tablet (2.5 mg total) by mouth daily., Starting 05/21/2014, Until Discontinued, Normal    furosemide (LASIX) 80 MG tablet Take 0.5 tablets (40 mg total) by mouth 2 (two) times daily., Starting 05/21/2014, Until Discontinued, Normal    gabapentin (NEURONTIN) 300 MG capsule Take 300 mg by mouth 3 (three) times daily., Until Discontinued, Historical Med    glipiZIDE (GLUCOTROL) 10 MG tablet Take 1 tablet by mouth daily before breakfast. , Starting 08/11/2013, Until Discontinued, Historical Med    isosorbide-hydrALAZINE (BIDIL) 20-37.5 MG per tablet Take 2 tablets by mouth 2 (two) times daily., Starting 05/21/2014, Until Discontinued, Normal    metFORMIN (GLUCOPHAGE) 1000 MG tablet Take 1 tablet (1,000 mg total) by mouth 2 (two) times daily with a meal., Starting 05/14/2011, Until Discontinued, Normal    niacin (NIASPAN) 500 MG CR tablet Take 500 mg by mouth at bedtime. , Starting 08/11/2013, Until Discontinued, Historical Med    venlafaxine (EFFEXOR) 50 MG tablet Take 1 tablet (50 mg total) by mouth at  bedtime., Starting 10/03/2011, Until Discontinued, Normal    zolpidem (AMBIEN) 10 MG tablet Take 10 mg by mouth at bedtime as needed for sleep., Until Discontinued, Historical Med      STOP taking these medications     traMADol-acetaminophen (ULTRACET) 37.5-325 MG per tablet         ALLERGIES:  No Known Allergies  BRIEF HPI:  See H&P, Labs, Consult and Test reports for all details in brief, patient is a 69 year old man with history of chronic systolic heart failure with EF around 30-35%, stage IV chronic  kidney disease, chronic pain at his left AKA stump admitted for evaluation of altered mental status.  CONSULTATIONS:   None  PERTINENT RADIOLOGIC STUDIES: Dg Chest 2 View  11/11/2014   CLINICAL DATA:  Shortness of Breath  EXAM: CHEST  2 VIEW  COMPARISON:  05/20/2014  FINDINGS: Left AICD remains in place, unchanged. There is cardiomegaly with vascular congestion. No overt edema. No confluent opacities or effusions. No acute bony abnormality.  IMPRESSION: Cardiomegaly with vascular congestion.   Electronically Signed   By: Charlett Nose M.D.   On: 11/11/2014 17:25     PERTINENT LAB RESULTS: CBC:  Recent Labs  11/11/14 1701  WBC 14.9*  HGB 12.9*  HCT 40.4  PLT 283   CMET CMP     Component Value Date/Time   NA 140 11/11/2014 1701   K 3.9 11/11/2014 1701   CL 106 11/11/2014 1701   CO2 22 11/11/2014 1701   GLUCOSE 139* 11/11/2014 1701   BUN 20 11/11/2014 1701   CREATININE 2.01* 11/11/2014 1701   CREATININE 1.13 09/16/2010 1518   CALCIUM 8.4* 11/11/2014 1701   PROT 6.9 11/11/2014 1701   ALBUMIN 3.0* 11/11/2014 1701   AST 23 11/11/2014 1701   ALT 16* 11/11/2014 1701   ALKPHOS 58 11/11/2014 1701   BILITOT 0.2* 11/11/2014 1701   GFRNONAA 32* 11/11/2014 1701   GFRAA 37* 11/11/2014 1701    GFR Estimated Creatinine Clearance: 36 mL/min (by C-G formula based on Cr of 2.01). No results for input(s): LIPASE, AMYLASE in the last 72 hours.  Recent Labs  11/11/14 1701  CKTOTAL 309  TROPONINI 0.03   Invalid input(s): POCBNP No results for input(s): DDIMER in the last 72 hours. No results for input(s): HGBA1C in the last 72 hours. No results for input(s): CHOL, HDL, LDLCALC, TRIG, CHOLHDL, LDLDIRECT in the last 72 hours. No results for input(s): TSH, T4TOTAL, T3FREE, THYROIDAB in the last 72 hours.  Invalid input(s): FREET3 No results for input(s): VITAMINB12, FOLATE, FERRITIN, TIBC, IRON, RETICCTPCT in the last 72 hours. Coags: No results for input(s): INR in the last 72  hours.  Invalid input(s): PT Microbiology: No results found for this or any previous visit (from the past 240 hour(s)).   BRIEF HOSPITAL COURSE:   Active Problems:   Acute toxic encephalopathy: Secondary to Opiate overdose. Patient normally takes Percocet twice a day-claims that on the day of admission he took a second dose much earlier than usual. Upon review of his medication list-he also appears to be on Ultracet. Patient responded to Narcan that EMS administered. He was subsequently admitted for overnight observation. This morning, patient is completely awake and alert without any complaints. This M.D. spent a good deal of time speaking with the patient first, and then with his sister at bedside going over all of his medications. Patient's sister lives in the same building-I have asked her to make sure that we do not continue Ultracet. I have  asked her to keep the Percocet with her and dispense it only twice a day that he normally takes. I have also instructed/counseled that he is also on Elavil and Neurontin that can increase drowsiness. However he appears to be on this regimen for a while. I have asked him to follow-up with his PCP, and possibly try a non-narcotic alternatives. However since he is on chronic narcotics, cannot abruptly discontinue as risk of withdrawal is high.  Rest of his medical issues were stable during this short hospital stay.  TODAY-DAY OF DISCHARGE:  Subjective:   Colman Cater today has no headache,no chest abdominal pain,no new weakness tingling or numbness, feels much better wants to go home today.  Objective:   Blood pressure 156/75, pulse 76, temperature 99 F (37.2 C), temperature source Oral, resp. rate 24, height 5\' 8"  (1.727 m), weight 83.462 kg (184 lb), SpO2 91 %.  Intake/Output Summary (Last 24 hours) at 11/12/14 1218 Last data filed at 11/12/14 1032  Gross per 24 hour  Intake    360 ml  Output      0 ml  Net    360 ml   Filed Weights    11/11/14 2348  Weight: 83.462 kg (184 lb)    Exam Awake Alert, Oriented *3, No new F.N deficits, Normal affect [place].AT,PERRAL Supple Neck,No JVD, No cervical lymphadenopathy appriciated.  Symmetrical Chest wall movement, Good air movement bilaterally, CTAB RRR,No Gallops,Rubs or new Murmurs, No Parasternal Heave +ve B.Sounds, Abd Soft, Non tender, No organomegaly appriciated, No rebound -guarding or rigidity. No Cyanosis, Clubbing or edema, No new Rash or bruise  DISCHARGE CONDITION: Stable  DISPOSITION: Home  DISCHARGE INSTRUCTIONS:    Activity:  As tolerated with Full fall precautions use walker/cane & assistance as needed  Diet recommendation: Heart Healthy diet  Discharge Instructions    (HEART FAILURE PATIENTS) Call MD:  Anytime you have any of the following symptoms: 1) 3 pound weight gain in 24 hours or 5 pounds in 1 week 2) shortness of breath, with or without a dry hacking cough 3) swelling in the hands, feet or stomach 4) if you have to sleep on extra pillows at night in order to breathe.    Complete by:  As directed      Diet - low sodium heart healthy    Complete by:  As directed      Increase activity slowly    Complete by:  As directed            Follow-up Information    Follow up with [facility], MD. Schedule an appointment as soon as possible for a visit in 1 week.   Specialty:  Internal Medicine   Contact information:   [address] [place] [address] [phone]      Total Time spent on discharge equals 45 minutes.  SignedJeoffrey Massed 11/12/2014 12:18 PM

## 2014-11-21 ENCOUNTER — Ambulatory Visit (INDEPENDENT_AMBULATORY_CARE_PROVIDER_SITE_OTHER): Payer: Medicare Other | Admitting: *Deleted

## 2014-11-21 DIAGNOSIS — I469 Cardiac arrest, cause unspecified: Secondary | ICD-10-CM | POA: Diagnosis not present

## 2014-11-21 NOTE — Progress Notes (Signed)
Remote ICD transmission.   

## 2014-11-26 LAB — CUP PACEART REMOTE DEVICE CHECK
Battery Voltage: 3.02 V
Brady Statistic AP VP Percent: 0.21 %
Brady Statistic AP VS Percent: 0.03 %
Brady Statistic AS VP Percent: 97.9 %
Brady Statistic RA Percent Paced: 0.24 %
Brady Statistic RV Percent Paced: 96.98 %
HIGH POWER IMPEDANCE MEASURED VALUE: 68 Ohm
Lead Channel Impedance Value: 304 Ohm
Lead Channel Impedance Value: 342 Ohm
Lead Channel Impedance Value: 361 Ohm
Lead Channel Impedance Value: 361 Ohm
Lead Channel Impedance Value: 456 Ohm
Lead Channel Impedance Value: 513 Ohm
Lead Channel Impedance Value: 551 Ohm
Lead Channel Impedance Value: 589 Ohm
Lead Channel Impedance Value: 589 Ohm
Lead Channel Impedance Value: 589 Ohm
Lead Channel Impedance Value: 608 Ohm
Lead Channel Pacing Threshold Amplitude: 0.75 V
Lead Channel Pacing Threshold Pulse Width: 0.4 ms
Lead Channel Pacing Threshold Pulse Width: 0.4 ms
Lead Channel Sensing Intrinsic Amplitude: 19 mV
Lead Channel Sensing Intrinsic Amplitude: 19 mV
Lead Channel Sensing Intrinsic Amplitude: 2.75 mV
Lead Channel Sensing Intrinsic Amplitude: 2.75 mV
Lead Channel Setting Pacing Amplitude: 1.75 V
Lead Channel Setting Pacing Amplitude: 2 V
Lead Channel Setting Pacing Amplitude: 2.5 V
Lead Channel Setting Pacing Pulse Width: 0.4 ms
Lead Channel Setting Pacing Pulse Width: 0.4 ms
Lead Channel Setting Sensing Sensitivity: 0.3 mV
MDC IDC MSMT BATTERY REMAINING LONGEVITY: 93 mo
MDC IDC MSMT LEADCHNL LV IMPEDANCE VALUE: 342 Ohm
MDC IDC MSMT LEADCHNL LV IMPEDANCE VALUE: 532 Ohm
MDC IDC MSMT LEADCHNL LV PACING THRESHOLD AMPLITUDE: 0.625 V
MDC IDC MSMT LEADCHNL RV PACING THRESHOLD AMPLITUDE: 0.5 V
MDC IDC MSMT LEADCHNL RV PACING THRESHOLD PULSEWIDTH: 0.4 ms
MDC IDC SESS DTM: 20160531083723
MDC IDC SET ZONE DETECTION INTERVAL: 250 ms
MDC IDC STAT BRADY AS VS PERCENT: 1.87 %
Zone Setting Detection Interval: 300 ms
Zone Setting Detection Interval: 350 ms
Zone Setting Detection Interval: 450 ms

## 2014-12-13 ENCOUNTER — Encounter: Payer: Self-pay | Admitting: Cardiology

## 2014-12-26 ENCOUNTER — Encounter: Payer: Self-pay | Admitting: Internal Medicine

## 2015-01-27 ENCOUNTER — Emergency Department (HOSPITAL_COMMUNITY): Payer: Medicare Other

## 2015-01-27 ENCOUNTER — Encounter (HOSPITAL_COMMUNITY): Payer: Self-pay | Admitting: Emergency Medicine

## 2015-01-27 ENCOUNTER — Inpatient Hospital Stay (HOSPITAL_COMMUNITY)
Admission: EM | Admit: 2015-01-27 | Discharge: 2015-02-22 | DRG: 296 | Disposition: E | Payer: Medicare Other | Attending: Pulmonary Disease | Admitting: Pulmonary Disease

## 2015-01-27 DIAGNOSIS — I129 Hypertensive chronic kidney disease with stage 1 through stage 4 chronic kidney disease, or unspecified chronic kidney disease: Secondary | ICD-10-CM | POA: Diagnosis present

## 2015-01-27 DIAGNOSIS — N183 Chronic kidney disease, stage 3 (moderate): Secondary | ICD-10-CM | POA: Diagnosis present

## 2015-01-27 DIAGNOSIS — I428 Other cardiomyopathies: Secondary | ICD-10-CM | POA: Diagnosis present

## 2015-01-27 DIAGNOSIS — E1165 Type 2 diabetes mellitus with hyperglycemia: Secondary | ICD-10-CM | POA: Diagnosis present

## 2015-01-27 DIAGNOSIS — Z7982 Long term (current) use of aspirin: Secondary | ICD-10-CM

## 2015-01-27 DIAGNOSIS — Z79891 Long term (current) use of opiate analgesic: Secondary | ICD-10-CM

## 2015-01-27 DIAGNOSIS — G931 Anoxic brain damage, not elsewhere classified: Secondary | ICD-10-CM | POA: Diagnosis present

## 2015-01-27 DIAGNOSIS — E1122 Type 2 diabetes mellitus with diabetic chronic kidney disease: Secondary | ICD-10-CM | POA: Diagnosis present

## 2015-01-27 DIAGNOSIS — F1420 Cocaine dependence, uncomplicated: Secondary | ICD-10-CM | POA: Diagnosis present

## 2015-01-27 DIAGNOSIS — Z89612 Acquired absence of left leg above knee: Secondary | ICD-10-CM | POA: Diagnosis not present

## 2015-01-27 DIAGNOSIS — Z95810 Presence of automatic (implantable) cardiac defibrillator: Secondary | ICD-10-CM

## 2015-01-27 DIAGNOSIS — F329 Major depressive disorder, single episode, unspecified: Secondary | ICD-10-CM | POA: Diagnosis present

## 2015-01-27 DIAGNOSIS — E878 Other disorders of electrolyte and fluid balance, not elsewhere classified: Secondary | ICD-10-CM | POA: Diagnosis not present

## 2015-01-27 DIAGNOSIS — Z89619 Acquired absence of unspecified leg above knee: Secondary | ICD-10-CM | POA: Diagnosis not present

## 2015-01-27 DIAGNOSIS — I469 Cardiac arrest, cause unspecified: Principal | ICD-10-CM | POA: Diagnosis present

## 2015-01-27 DIAGNOSIS — I2510 Atherosclerotic heart disease of native coronary artery without angina pectoris: Secondary | ICD-10-CM | POA: Diagnosis present

## 2015-01-27 DIAGNOSIS — E872 Acidosis: Secondary | ICD-10-CM | POA: Diagnosis present

## 2015-01-27 DIAGNOSIS — I472 Ventricular tachycardia: Secondary | ICD-10-CM | POA: Diagnosis present

## 2015-01-27 DIAGNOSIS — R0600 Dyspnea, unspecified: Secondary | ICD-10-CM | POA: Diagnosis not present

## 2015-01-27 DIAGNOSIS — D638 Anemia in other chronic diseases classified elsewhere: Secondary | ICD-10-CM | POA: Diagnosis present

## 2015-01-27 DIAGNOSIS — Z515 Encounter for palliative care: Secondary | ICD-10-CM | POA: Diagnosis not present

## 2015-01-27 DIAGNOSIS — I447 Left bundle-branch block, unspecified: Secondary | ICD-10-CM | POA: Diagnosis present

## 2015-01-27 DIAGNOSIS — Z452 Encounter for adjustment and management of vascular access device: Secondary | ICD-10-CM

## 2015-01-27 DIAGNOSIS — B1920 Unspecified viral hepatitis C without hepatic coma: Secondary | ICD-10-CM | POA: Diagnosis present

## 2015-01-27 DIAGNOSIS — J9601 Acute respiratory failure with hypoxia: Secondary | ICD-10-CM | POA: Diagnosis present

## 2015-01-27 DIAGNOSIS — Z79899 Other long term (current) drug therapy: Secondary | ICD-10-CM | POA: Diagnosis not present

## 2015-01-27 DIAGNOSIS — E781 Pure hyperglyceridemia: Secondary | ICD-10-CM | POA: Diagnosis present

## 2015-01-27 DIAGNOSIS — I10 Essential (primary) hypertension: Secondary | ICD-10-CM | POA: Diagnosis not present

## 2015-01-27 DIAGNOSIS — E1151 Type 2 diabetes mellitus with diabetic peripheral angiopathy without gangrene: Secondary | ICD-10-CM | POA: Diagnosis present

## 2015-01-27 DIAGNOSIS — R4020 Unspecified coma: Secondary | ICD-10-CM

## 2015-01-27 DIAGNOSIS — J45909 Unspecified asthma, uncomplicated: Secondary | ICD-10-CM | POA: Diagnosis present

## 2015-01-27 DIAGNOSIS — I5022 Chronic systolic (congestive) heart failure: Secondary | ICD-10-CM | POA: Diagnosis present

## 2015-01-27 DIAGNOSIS — I480 Paroxysmal atrial fibrillation: Secondary | ICD-10-CM | POA: Diagnosis present

## 2015-01-27 DIAGNOSIS — J690 Pneumonitis due to inhalation of food and vomit: Secondary | ICD-10-CM | POA: Diagnosis not present

## 2015-01-27 DIAGNOSIS — G9340 Encephalopathy, unspecified: Secondary | ICD-10-CM | POA: Diagnosis not present

## 2015-01-27 DIAGNOSIS — F17210 Nicotine dependence, cigarettes, uncomplicated: Secondary | ICD-10-CM | POA: Diagnosis present

## 2015-01-27 DIAGNOSIS — N179 Acute kidney failure, unspecified: Secondary | ICD-10-CM | POA: Diagnosis not present

## 2015-01-27 DIAGNOSIS — E876 Hypokalemia: Secondary | ICD-10-CM | POA: Diagnosis not present

## 2015-01-27 DIAGNOSIS — D696 Thrombocytopenia, unspecified: Secondary | ICD-10-CM | POA: Diagnosis present

## 2015-01-27 DIAGNOSIS — G253 Myoclonus: Secondary | ICD-10-CM | POA: Diagnosis present

## 2015-01-27 DIAGNOSIS — R570 Cardiogenic shock: Secondary | ICD-10-CM | POA: Diagnosis not present

## 2015-01-27 DIAGNOSIS — Z9289 Personal history of other medical treatment: Secondary | ICD-10-CM

## 2015-01-27 DIAGNOSIS — G936 Cerebral edema: Secondary | ICD-10-CM

## 2015-01-27 DIAGNOSIS — Z66 Do not resuscitate: Secondary | ICD-10-CM | POA: Diagnosis present

## 2015-01-27 LAB — COMPREHENSIVE METABOLIC PANEL
ALBUMIN: 3 g/dL — AB (ref 3.5–5.0)
ALK PHOS: 57 U/L (ref 38–126)
ALT: 15 U/L — ABNORMAL LOW (ref 17–63)
AST: 34 U/L (ref 15–41)
Anion gap: 15 (ref 5–15)
BILIRUBIN TOTAL: 1.1 mg/dL (ref 0.3–1.2)
BUN: 10 mg/dL (ref 6–20)
CHLORIDE: 101 mmol/L (ref 101–111)
CO2: 20 mmol/L — ABNORMAL LOW (ref 22–32)
CREATININE: 2.55 mg/dL — AB (ref 0.61–1.24)
Calcium: 7.9 mg/dL — ABNORMAL LOW (ref 8.9–10.3)
GFR calc Af Amer: 28 mL/min — ABNORMAL LOW (ref >60)
GFR, EST NON AFRICAN AMERICAN: 24 mL/min — AB (ref >60)
GLUCOSE: 226 mg/dL — AB (ref 65–99)
Potassium: 3.1 mmol/L — ABNORMAL LOW (ref 3.5–5.1)
Sodium: 136 mmol/L (ref 135–145)
Total Protein: 6.4 g/dL — ABNORMAL LOW (ref 6.5–8.1)

## 2015-01-27 LAB — I-STAT ARTERIAL BLOOD GAS, ED
Acid-base deficit: 5 mmol/L — ABNORMAL HIGH (ref 0.0–2.0)
Bicarbonate: 23.2 mEq/L (ref 20.0–24.0)
O2 SAT: 97 %
TCO2: 25 mmol/L (ref 0–100)
pCO2 arterial: 53.1 mmHg — ABNORMAL HIGH (ref 35.0–45.0)
pH, Arterial: 7.248 — ABNORMAL LOW (ref 7.350–7.450)
pO2, Arterial: 100 mmHg (ref 80.0–100.0)

## 2015-01-27 LAB — I-STAT CHEM 8, ED
BUN: 13 mg/dL (ref 6–20)
CHLORIDE: 102 mmol/L (ref 101–111)
Calcium, Ion: 1.08 mmol/L — ABNORMAL LOW (ref 1.13–1.30)
Creatinine, Ser: 2.2 mg/dL — ABNORMAL HIGH (ref 0.61–1.24)
Glucose, Bld: 232 mg/dL — ABNORMAL HIGH (ref 65–99)
HCT: 43 % (ref 39.0–52.0)
Hemoglobin: 14.6 g/dL (ref 13.0–17.0)
POTASSIUM: 3.2 mmol/L — AB (ref 3.5–5.1)
Sodium: 143 mmol/L (ref 135–145)
TCO2: 22 mmol/L (ref 0–100)

## 2015-01-27 LAB — CBC WITH DIFFERENTIAL/PLATELET
BASOS ABS: 0.1 10*3/uL (ref 0.0–0.1)
BASOS PCT: 0 % (ref 0–1)
Eosinophils Absolute: 0.7 10*3/uL (ref 0.0–0.7)
Eosinophils Relative: 3 % (ref 0–5)
HCT: 39.9 % (ref 39.0–52.0)
Hemoglobin: 12.8 g/dL — ABNORMAL LOW (ref 13.0–17.0)
LYMPHS ABS: 5.4 10*3/uL — AB (ref 0.7–4.0)
Lymphocytes Relative: 25 % (ref 12–46)
MCH: 26.6 pg (ref 26.0–34.0)
MCHC: 32.1 g/dL (ref 30.0–36.0)
MCV: 83 fL (ref 78.0–100.0)
MONOS PCT: 7 % (ref 3–12)
Monocytes Absolute: 1.6 10*3/uL — ABNORMAL HIGH (ref 0.1–1.0)
NEUTROS PCT: 65 % (ref 43–77)
Neutro Abs: 13.8 10*3/uL — ABNORMAL HIGH (ref 1.7–7.7)
PLATELETS: 210 10*3/uL (ref 150–400)
RBC: 4.81 MIL/uL (ref 4.22–5.81)
RDW: 14.8 % (ref 11.5–15.5)
WBC: 21.6 10*3/uL — ABNORMAL HIGH (ref 4.0–10.5)

## 2015-01-27 LAB — I-STAT CG4 LACTIC ACID, ED: Lactic Acid, Venous: 5.77 mmol/L (ref 0.5–2.0)

## 2015-01-27 LAB — TROPONIN I: Troponin I: 0.1 ng/mL — ABNORMAL HIGH (ref <0.031)

## 2015-01-27 LAB — PROTIME-INR
INR: 1.29 (ref 0.00–1.49)
PROTHROMBIN TIME: 16.3 s — AB (ref 11.6–15.2)

## 2015-01-27 LAB — CBG MONITORING, ED: GLUCOSE-CAPILLARY: 201 mg/dL — AB (ref 65–99)

## 2015-01-27 LAB — I-STAT TROPONIN, ED: Troponin i, poc: 0.11 ng/mL (ref 0.00–0.08)

## 2015-01-27 MED ORDER — SUCCINYLCHOLINE CHLORIDE 20 MG/ML IJ SOLN
INTRAMUSCULAR | Status: AC
  Filled 2015-01-27: qty 1

## 2015-01-27 MED ORDER — ROCURONIUM BROMIDE 50 MG/5ML IV SOLN
INTRAVENOUS | Status: AC
  Filled 2015-01-27: qty 2

## 2015-01-27 MED ORDER — FENTANYL CITRATE (PF) 100 MCG/2ML IJ SOLN: 50.0000 ug | Freq: Once | INTRAMUSCULAR | Status: DC

## 2015-01-27 MED ORDER — DEXTROSE 5 % IV SOLN
0.0000 ug/min | INTRAVENOUS | Status: DC
  Administered 2015-01-29: 2 ug/min via INTRAVENOUS
  Filled 2015-01-27: qty 4

## 2015-01-27 MED ORDER — ASPIRIN 300 MG RE SUPP
300.0000 mg | RECTAL | Status: AC
  Administered 2015-01-28: 300 mg via RECTAL
  Filled 2015-01-27: qty 1

## 2015-01-27 MED ORDER — HEPARIN SODIUM (PORCINE) 5000 UNIT/ML IJ SOLN: 5000.0000 [IU] | Freq: Three times a day (TID) | INTRAMUSCULAR | Status: DC

## 2015-01-27 MED ORDER — CISATRACURIUM BOLUS VIA INFUSION
0.0500 mg/kg | INTRAVENOUS | Status: DC | PRN
  Filled 2015-01-27: qty 5

## 2015-01-27 MED ORDER — PROPOFOL 1000 MG/100ML IV EMUL
5.0000 ug/kg/min | Freq: Once | INTRAVENOUS | Status: AC
Start: 2015-01-27 — End: 2015-01-27
  Administered 2015-01-27: 5 ug/kg/min via INTRAVENOUS

## 2015-01-27 MED ORDER — ARTIFICIAL TEARS OP OINT
1.0000 "application " | TOPICAL_OINTMENT | Freq: Three times a day (TID) | OPHTHALMIC | Status: DC
  Administered 2015-01-28 – 2015-01-29 (×6): 1 via OPHTHALMIC
  Filled 2015-01-27 (×2): qty 3.5

## 2015-01-27 MED ORDER — SODIUM CHLORIDE 0.9 % IV SOLN
25.0000 ug/h | INTRAVENOUS | Status: DC
  Administered 2015-01-28: 150 ug/h via INTRAVENOUS
  Administered 2015-01-28 (×2): 200 ug/h via INTRAVENOUS
  Administered 2015-01-29 (×2): 250 ug/h via INTRAVENOUS
  Filled 2015-01-27 (×4): qty 50

## 2015-01-27 MED ORDER — PROPOFOL 1000 MG/100ML IV EMUL
INTRAVENOUS | Status: AC
  Filled 2015-01-27: qty 100

## 2015-01-27 MED ORDER — FENTANYL BOLUS VIA INFUSION
25.0000 ug | INTRAVENOUS | Status: DC | PRN
Start: 2015-01-27 — End: 2015-01-29
  Filled 2015-01-27: qty 25

## 2015-01-27 MED ORDER — LIDOCAINE HCL (CARDIAC) 20 MG/ML IV SOLN
INTRAVENOUS | Status: AC
  Filled 2015-01-27: qty 5

## 2015-01-27 MED ORDER — CISATRACURIUM BOLUS VIA INFUSION
0.1000 mg/kg | Freq: Once | INTRAVENOUS | Status: DC
  Filled 2015-01-27: qty 9

## 2015-01-27 MED ORDER — ETOMIDATE 2 MG/ML IV SOLN
INTRAVENOUS | Status: AC
  Filled 2015-01-27: qty 20

## 2015-01-27 MED ORDER — SODIUM CHLORIDE 0.9 % IV SOLN: 2000.0000 mL | Freq: Once | INTRAVENOUS | Status: DC

## 2015-01-27 MED ORDER — PROPOFOL 1000 MG/100ML IV EMUL
5.0000 ug/kg/min | INTRAVENOUS | Status: DC
  Administered 2015-01-27: 20 ug/kg/min via INTRAVENOUS
  Administered 2015-01-28: 30 ug/kg/min via INTRAVENOUS
  Filled 2015-01-27: qty 100

## 2015-01-27 MED ORDER — CISATRACURIUM BESYLATE (PF) 200 MG/20ML IV SOLN
1.0000 ug/kg/min | INTRAVENOUS | Status: DC
  Administered 2015-01-28: 1 ug/kg/min via INTRAVENOUS
  Filled 2015-01-27 (×2): qty 20

## 2015-01-27 NOTE — ED Notes (Signed)
80mg  of roc administered

## 2015-01-27 NOTE — ED Notes (Signed)
Per EMS, patient called from home saying he cant breathe. On ems arrival, patient was in PEA, rate of 58. 4 epi's, 1 amp of d50, 2mg  of narcan administered. 300mg  push of amio given for vtac, rate of 150 and drip of 150mg  given prior to arrival. 18g presnt in right forearm, and IO on arrival.Dr. Criss Alvine at the bedside.

## 2015-01-27 NOTE — ED Notes (Signed)
[facility] CONTACTED TO CALL CODE COOL 

## 2015-01-27 NOTE — ED Notes (Signed)
Xray present at the bedsid.e

## 2015-01-27 NOTE — ED Notes (Signed)
20mg  of etomidate given for intubation.

## 2015-01-27 NOTE — ED Notes (Signed)
Notified charge for interogation on [employer] device.

## 2015-01-28 ENCOUNTER — Inpatient Hospital Stay (HOSPITAL_COMMUNITY): Payer: Medicare Other

## 2015-01-28 ENCOUNTER — Encounter (HOSPITAL_COMMUNITY): Payer: Self-pay | Admitting: Anesthesiology

## 2015-01-28 DIAGNOSIS — J9601 Acute respiratory failure with hypoxia: Secondary | ICD-10-CM

## 2015-01-28 DIAGNOSIS — I469 Cardiac arrest, cause unspecified: Principal | ICD-10-CM

## 2015-01-28 LAB — URINALYSIS, ROUTINE W REFLEX MICROSCOPIC
Bilirubin Urine: NEGATIVE
GLUCOSE, UA: 100 mg/dL — AB
Ketones, ur: NEGATIVE mg/dL
Leukocytes, UA: NEGATIVE
NITRITE: NEGATIVE
PH: 6.5 (ref 5.0–8.0)
Protein, ur: >300 mg/dL — AB
Specific Gravity, Urine: 1.011 (ref 1.005–1.030)
Urobilinogen, UA: 0.2 mg/dL (ref 0.0–1.0)

## 2015-01-28 LAB — GLUCOSE, CAPILLARY
GLUCOSE-CAPILLARY: 240 mg/dL — AB (ref 65–99)
GLUCOSE-CAPILLARY: 96 mg/dL (ref 65–99)
GLUCOSE-CAPILLARY: 88 mg/dL (ref 65–99)
GLUCOSE-CAPILLARY: 87 mg/dL (ref 65–99)
GLUCOSE-CAPILLARY: 80 mg/dL (ref 65–99)
Glucose-Capillary: 211 mg/dL — ABNORMAL HIGH (ref 65–99)
Glucose-Capillary: 213 mg/dL — ABNORMAL HIGH (ref 65–99)
Glucose-Capillary: 183 mg/dL — ABNORMAL HIGH (ref 65–99)
Glucose-Capillary: 134 mg/dL — ABNORMAL HIGH (ref 65–99)
Glucose-Capillary: 117 mg/dL — ABNORMAL HIGH (ref 65–99)

## 2015-01-28 LAB — COMPREHENSIVE METABOLIC PANEL
ALBUMIN: 2.8 g/dL — AB (ref 3.5–5.0)
ALT: 17 U/L (ref 17–63)
AST: 30 U/L (ref 15–41)
Alkaline Phosphatase: 58 U/L (ref 38–126)
Anion gap: 10 (ref 5–15)
BUN: 13 mg/dL (ref 6–20)
CO2: 18 mmol/L — AB (ref 22–32)
CREATININE: 2.16 mg/dL — AB (ref 0.61–1.24)
Calcium: 7.7 mg/dL — ABNORMAL LOW (ref 8.9–10.3)
Chloride: 111 mmol/L (ref 101–111)
GFR calc Af Amer: 34 mL/min — ABNORMAL LOW (ref >60)
GFR calc non Af Amer: 29 mL/min — ABNORMAL LOW (ref >60)
GLUCOSE: 205 mg/dL — AB (ref 65–99)
POTASSIUM: 3.2 mmol/L — AB (ref 3.5–5.1)
SODIUM: 139 mmol/L (ref 135–145)
TOTAL PROTEIN: 5.9 g/dL — AB (ref 6.5–8.1)
Total Bilirubin: 1.8 mg/dL — ABNORMAL HIGH (ref 0.3–1.2)

## 2015-01-28 LAB — I-STAT ARTERIAL BLOOD GAS, ED
Bicarbonate: 25.8 mEq/L — ABNORMAL HIGH (ref 20.0–24.0)
O2 Saturation: 98 %
Patient temperature: 98.3
TCO2: 27 mmol/L (ref 0–100)
pCO2 arterial: 46.4 mmHg — ABNORMAL HIGH (ref 35.0–45.0)
pH, Arterial: 7.353 (ref 7.350–7.450)
pO2, Arterial: 120 mmHg — ABNORMAL HIGH (ref 80.0–100.0)

## 2015-01-28 LAB — APTT
aPTT: 31 seconds (ref 24–37)
aPTT: 55 seconds — ABNORMAL HIGH (ref 24–37)

## 2015-01-28 LAB — POCT I-STAT, CHEM 8
BUN: 14 mg/dL (ref 6–20)
BUN: 14 mg/dL (ref 6–20)
BUN: 14 mg/dL (ref 6–20)
BUN: 14 mg/dL (ref 6–20)
BUN: 19 mg/dL (ref 6–20)
CALCIUM ION: 1.05 mmol/L — AB (ref 1.13–1.30)
CALCIUM ION: 1.13 mmol/L (ref 1.13–1.30)
CHLORIDE: 107 mmol/L (ref 101–111)
CHLORIDE: 108 mmol/L (ref 101–111)
CHLORIDE: 115 mmol/L — AB (ref 101–111)
CREATININE: 2.5 mg/dL — AB (ref 0.61–1.24)
CREATININE: 2.3 mg/dL — AB (ref 0.61–1.24)
Calcium, Ion: 1.05 mmol/L — ABNORMAL LOW (ref 1.13–1.30)
Calcium, Ion: 1.1 mmol/L — ABNORMAL LOW (ref 1.13–1.30)
Calcium, Ion: 1.08 mmol/L — ABNORMAL LOW (ref 1.13–1.30)
Chloride: 110 mmol/L (ref 101–111)
Chloride: 109 mmol/L (ref 101–111)
Creatinine, Ser: 2.2 mg/dL — ABNORMAL HIGH (ref 0.61–1.24)
Creatinine, Ser: 2.3 mg/dL — ABNORMAL HIGH (ref 0.61–1.24)
Creatinine, Ser: 2.4 mg/dL — ABNORMAL HIGH (ref 0.61–1.24)
GLUCOSE: 85 mg/dL (ref 65–99)
Glucose, Bld: 245 mg/dL — ABNORMAL HIGH (ref 65–99)
Glucose, Bld: 224 mg/dL — ABNORMAL HIGH (ref 65–99)
Glucose, Bld: 204 mg/dL — ABNORMAL HIGH (ref 65–99)
Glucose, Bld: 154 mg/dL — ABNORMAL HIGH (ref 65–99)
HCT: 41 % (ref 39.0–52.0)
HCT: 44 % (ref 39.0–52.0)
HCT: 42 % (ref 39.0–52.0)
HCT: 40 % (ref 39.0–52.0)
HEMATOCRIT: 41 % (ref 39.0–52.0)
HEMOGLOBIN: 15 g/dL (ref 13.0–17.0)
HEMOGLOBIN: 13.6 g/dL (ref 13.0–17.0)
HEMOGLOBIN: 13.9 g/dL (ref 13.0–17.0)
Hemoglobin: 13.9 g/dL (ref 13.0–17.0)
Hemoglobin: 14.3 g/dL (ref 13.0–17.0)
POTASSIUM: 2.8 mmol/L — AB (ref 3.5–5.1)
Potassium: 3.1 mmol/L — ABNORMAL LOW (ref 3.5–5.1)
Potassium: 3.1 mmol/L — ABNORMAL LOW (ref 3.5–5.1)
Potassium: 2.9 mmol/L — ABNORMAL LOW (ref 3.5–5.1)
Potassium: 3.5 mmol/L (ref 3.5–5.1)
SODIUM: 139 mmol/L (ref 135–145)
Sodium: 138 mmol/L (ref 135–145)
Sodium: 144 mmol/L (ref 135–145)
Sodium: 142 mmol/L (ref 135–145)
Sodium: 145 mmol/L (ref 135–145)
TCO2: 23 mmol/L (ref 0–100)
TCO2: 18 mmol/L (ref 0–100)
TCO2: 18 mmol/L (ref 0–100)
TCO2: 19 mmol/L (ref 0–100)
TCO2: 20 mmol/L (ref 0–100)

## 2015-01-28 LAB — URINE MICROSCOPIC-ADD ON

## 2015-01-28 LAB — CBC
HEMATOCRIT: 38.1 % — AB (ref 39.0–52.0)
HEMOGLOBIN: 12.5 g/dL — AB (ref 13.0–17.0)
MCH: 26.3 pg (ref 26.0–34.0)
MCHC: 32.8 g/dL (ref 30.0–36.0)
MCV: 80.2 fL (ref 78.0–100.0)
Platelets: 182 10*3/uL (ref 150–400)
RBC: 4.75 MIL/uL (ref 4.22–5.81)
RDW: 14.7 % (ref 11.5–15.5)
WBC: 16.7 10*3/uL — ABNORMAL HIGH (ref 4.0–10.5)

## 2015-01-28 LAB — TYPE AND SCREEN
ABO/RH(D): A POS
Antibody Screen: NEGATIVE

## 2015-01-28 LAB — BRAIN NATRIURETIC PEPTIDE: B NATRIURETIC PEPTIDE 5: 2544.6 pg/mL — AB (ref 0.0–100.0)

## 2015-01-28 LAB — MAGNESIUM
MAGNESIUM: 2 mg/dL (ref 1.7–2.4)
Magnesium: 1.8 mg/dL (ref 1.7–2.4)

## 2015-01-28 LAB — RAPID URINE DRUG SCREEN, HOSP PERFORMED
Amphetamines: NOT DETECTED
BENZODIAZEPINES: NOT DETECTED
Barbiturates: NOT DETECTED
Cocaine: POSITIVE — AB
OPIATES: NOT DETECTED
TETRAHYDROCANNABINOL: NOT DETECTED

## 2015-01-28 LAB — BASIC METABOLIC PANEL
Anion gap: 10 (ref 5–15)
BUN: 16 mg/dL (ref 6–20)
CO2: 21 mmol/L — AB (ref 22–32)
Calcium: 8.3 mg/dL — ABNORMAL LOW (ref 8.9–10.3)
Chloride: 110 mmol/L (ref 101–111)
Creatinine, Ser: 2.46 mg/dL — ABNORMAL HIGH (ref 0.61–1.24)
GFR, EST AFRICAN AMERICAN: 29 mL/min — AB (ref >60)
GFR, EST NON AFRICAN AMERICAN: 25 mL/min — AB (ref >60)
GLUCOSE: 82 mg/dL (ref 65–99)
Potassium: 3.2 mmol/L — ABNORMAL LOW (ref 3.5–5.1)
Sodium: 141 mmol/L (ref 135–145)

## 2015-01-28 LAB — ETHANOL

## 2015-01-28 LAB — HEPARIN LEVEL (UNFRACTIONATED): Heparin Unfractionated: 0.3 IU/mL (ref 0.30–0.70)

## 2015-01-28 LAB — PHOSPHORUS
Phosphorus: 3.5 mg/dL (ref 2.5–4.6)
Phosphorus: 1.1 mg/dL — ABNORMAL LOW (ref 2.5–4.6)

## 2015-01-28 LAB — MRSA PCR SCREENING: MRSA by PCR: POSITIVE — AB

## 2015-01-28 LAB — PROCALCITONIN: PROCALCITONIN: 2.21 ng/mL

## 2015-01-28 LAB — PROTIME-INR
INR: 1.41 (ref 0.00–1.49)
Prothrombin Time: 17.3 seconds — ABNORMAL HIGH (ref 11.6–15.2)

## 2015-01-28 MED ORDER — SODIUM CHLORIDE 0.9 % IV SOLN
3.0000 g | Freq: Three times a day (TID) | INTRAVENOUS | Status: DC
  Administered 2015-01-28 – 2015-01-29 (×4): 3 g via INTRAVENOUS
  Filled 2015-01-28 (×5): qty 3

## 2015-01-28 MED ORDER — DEXTROSE 50 % IV SOLN
1.0000 | Freq: Once | INTRAVENOUS | Status: AC
  Administered 2015-01-28: 50 mL via INTRAVENOUS
  Filled 2015-01-28: qty 50

## 2015-01-28 MED ORDER — SODIUM CHLORIDE 0.9 % IV SOLN: 250.0000 mL | INTRAVENOUS | Status: DC | PRN

## 2015-01-28 MED ORDER — HEPARIN BOLUS VIA INFUSION
2500.0000 [IU] | Freq: Once | INTRAVENOUS | Status: AC
  Administered 2015-01-28: 2500 [IU] via INTRAVENOUS
  Filled 2015-01-28: qty 2500

## 2015-01-28 MED ORDER — POTASSIUM CHLORIDE 10 MEQ/50ML IV SOLN
10.0000 meq | INTRAVENOUS | Status: AC
  Administered 2015-01-28 (×4): 10 meq via INTRAVENOUS
  Filled 2015-01-28 (×4): qty 50

## 2015-01-28 MED ORDER — CHLORHEXIDINE GLUCONATE CLOTH 2 % EX PADS
6.0000 | MEDICATED_PAD | Freq: Every day | CUTANEOUS | Status: AC
  Administered 2015-01-29 – 2015-02-01 (×4): 6 via TOPICAL

## 2015-01-28 MED ORDER — POTASSIUM CHLORIDE 10 MEQ/50ML IV SOLN
10.0000 meq | INTRAVENOUS | Status: AC
  Administered 2015-01-28 (×2): 10 meq via INTRAVENOUS
  Filled 2015-01-28 (×2): qty 50

## 2015-01-28 MED ORDER — PANTOPRAZOLE SODIUM 40 MG PO PACK
40.0000 mg | PACK | Freq: Every day | ORAL | Status: DC
  Administered 2015-01-29 – 2015-02-01 (×4): 40 mg
  Filled 2015-01-28 (×5): qty 20

## 2015-01-28 MED ORDER — POTASSIUM CHLORIDE 10 MEQ/50ML IV SOLN: 10.0000 meq | INTRAVENOUS | Status: DC

## 2015-01-28 MED ORDER — ANTISEPTIC ORAL RINSE SOLUTION (CORINZ): 7.0000 mL | Freq: Four times a day (QID) | OROMUCOSAL | Status: DC

## 2015-01-28 MED ORDER — SODIUM CHLORIDE 0.9 % IV SOLN
1.0000 mg/h | INTRAVENOUS | Status: DC
  Administered 2015-01-28: 7 mg/h via INTRAVENOUS
  Administered 2015-01-28: 10 mg/h via INTRAVENOUS
  Administered 2015-01-28: 5 mg/h via INTRAVENOUS
  Administered 2015-01-29 (×4): 10 mg/h via INTRAVENOUS
  Filled 2015-01-28 (×7): qty 10

## 2015-01-28 MED ORDER — AMITRIPTYLINE HCL 100 MG PO TABS
100.0000 mg | ORAL_TABLET | Freq: Every day | ORAL | Status: DC
  Administered 2015-01-28 – 2015-01-29 (×2): 100 mg
  Filled 2015-01-28 (×3): qty 1

## 2015-01-28 MED ORDER — POTASSIUM CHLORIDE CRYS ER 20 MEQ PO TBCR
40.0000 meq | EXTENDED_RELEASE_TABLET | Freq: Once | ORAL | Status: AC
  Administered 2015-01-28: 40 meq via ORAL
  Filled 2015-01-28: qty 2

## 2015-01-28 MED ORDER — ASPIRIN 81 MG PO CHEW
81.0000 mg | CHEWABLE_TABLET | Freq: Every day | ORAL | Status: DC
  Administered 2015-01-28 – 2015-02-01 (×5): 81 mg
  Filled 2015-01-28 (×5): qty 1

## 2015-01-28 MED ORDER — POTASSIUM CHLORIDE 10 MEQ/100ML IV SOLN
10.0000 meq | INTRAVENOUS | Status: AC
  Administered 2015-01-28 (×4): 10 meq via INTRAVENOUS
  Filled 2015-01-28 (×4): qty 100

## 2015-01-28 MED ORDER — ANTISEPTIC ORAL RINSE SOLUTION (CORINZ)
7.0000 mL | OROMUCOSAL | Status: DC
  Administered 2015-01-28 – 2015-02-02 (×57): 7 mL via OROMUCOSAL

## 2015-01-28 MED ORDER — INSULIN ASPART 100 UNIT/ML ~~LOC~~ SOLN
0.0000 [IU] | SUBCUTANEOUS | Status: DC
  Administered 2015-01-28: 2 [IU] via SUBCUTANEOUS
  Administered 2015-01-28 (×2): 5 [IU] via SUBCUTANEOUS
  Administered 2015-01-31 – 2015-02-02 (×6): 2 [IU] via SUBCUTANEOUS

## 2015-01-28 MED ORDER — MUPIROCIN 2 % EX OINT
1.0000 "application " | TOPICAL_OINTMENT | Freq: Two times a day (BID) | CUTANEOUS | Status: DC
  Administered 2015-01-28 – 2015-01-31 (×7): 1 via NASAL
  Filled 2015-01-28: qty 22

## 2015-01-28 MED ORDER — FUROSEMIDE 10 MG/ML IJ SOLN
40.0000 mg | Freq: Once | INTRAMUSCULAR | Status: AC
  Administered 2015-01-28: 40 mg via INTRAVENOUS
  Filled 2015-01-28: qty 4

## 2015-01-28 MED ORDER — HEPARIN (PORCINE) IN NACL 100-0.45 UNIT/ML-% IJ SOLN
1100.0000 [IU]/h | INTRAMUSCULAR | Status: DC
  Administered 2015-01-28: 700 [IU]/h via INTRAVENOUS
  Administered 2015-01-29: 1100 [IU]/h via INTRAVENOUS
  Filled 2015-01-28 (×3): qty 250

## 2015-01-28 MED ORDER — ATORVASTATIN CALCIUM 10 MG PO TABS
10.0000 mg | ORAL_TABLET | Freq: Every day | ORAL | Status: DC
  Administered 2015-01-28 – 2015-01-31 (×4): 10 mg
  Filled 2015-01-28 (×5): qty 1

## 2015-01-28 MED ORDER — CHLORHEXIDINE GLUCONATE 0.12% ORAL RINSE (MEDLINE KIT)
15.0000 mL | Freq: Two times a day (BID) | OROMUCOSAL | Status: DC
  Administered 2015-01-28 – 2015-02-01 (×9): 15 mL via OROMUCOSAL

## 2015-01-28 NOTE — ED Notes (Signed)
Respiratory therapist at the bedside for arterial line

## 2015-01-28 NOTE — Progress Notes (Signed)
Utilization review completed.  

## 2015-01-28 NOTE — Procedures (Signed)
Arterial Catheter Insertion Procedure Note Jonathon Galvan [id number] 02-14-1946  Procedure: Insertion of Arterial Catheter  Indications: Blood pressure monitoring  Procedure Details Consent: Unable to obtain consent because of altered level of consciousness. Time Out: Verified patient identification, verified procedure, site/side was marked, verified correct patient position, special equipment/implants available, medications/allergies/relevent history reviewed, required imaging and test results available.  Performed  Maximum sterile technique was used including antiseptics, cap, gloves, gown, hand hygiene, mask and sheet. Skin prep: Chlorhexidine; local anesthetic administered 20 gauge catheter was inserted into right radial artery using the Seldinger technique.  Evaluation Blood flow good; BP tracing good. Complications: No apparent complications.   Tacy Learn 01/28/2015

## 2015-01-28 NOTE — ED Provider Notes (Signed)
I saw and evaluated the patient, reviewed the resident's note and I agree with the findings and plan.   EKG Interpretation   Date/Time:  Saturday January 27 2015 22:58:00 EDT Ventricular Rate:  57 PR Interval:  161 QRS Duration: 158 QT Interval:  534 QTC Calculation: 520 R Axis:   -81 Text Interpretation:  Sinus rhythm IVCD, consider atypical RBBB LVH with  secondary repolarization abnormality ED PHYSICIAN INTERPRETATION AVAILABLE  IN [facility] Confirmed by TEST, Record ([id number]) on 01/28/2015 8:20:18  AM       Patient presents after a cardiac arrest. Apparently he was calling EMS for shortness of breath and coded while EMS arrived. Patient had return of spontaneous circulation after only a few rounds of CPR, total of 12 minutes. Patient now has adequate blood pressure and is bradycardic. Possible ventricular tachycardia noted by EMS, none now. Patient is flexing to painful stimuli in all 4 extremities and is currently breathing on his own but does not wake up. Due to this rapid sequence intubation was performed. I directly supervised the resident with the intubation. After discussion with ICU the decision has been made to cool the patient. I have discussed with cardiology as he is well known to their service with previous ICD as well as CHF. They will consult patient will be admitted to the MICU.  CRITICAL CARE Performed by: Pricilla Loveless T   Total critical care time: 45 minutes  Critical care time was exclusive of separately billable procedures and treating other patients.  Critical care was necessary to treat or prevent imminent or life-threatening deterioration.  Critical care was time spent personally by me on the following activities: development of treatment plan with patient and/or surrogate as well as nursing, discussions with consultants, evaluation of patient's response to treatment, examination of patient, obtaining history from patient or surrogate, ordering and  performing treatments and interventions, ordering and review of laboratory studies, ordering and review of radiographic studies, pulse oximetry and re-evaluation of patient's condition.   Pricilla Loveless, MD 01/28/15 [phone]

## 2015-01-28 NOTE — Progress Notes (Signed)
ANTIBIOTIC CONSULT NOTE - INITIAL  Pharmacy Consult for Unasyn  Indication: Aspiration PNA  No Known Allergies  Patient Measurements: Height: 6' (182.9 cm) Weight: 183 lb 13.8 oz (83.399 kg) IBW/kg (Calculated) : 77.6  Vital Signs: Temp: 96.6 F (35.9 C) (08/07 0200) Temp Source: Core (Comment) (08/07 0130) BP: 156/89 mmHg (08/07 0200) Pulse Rate: 53 (08/07 0200) Intake/Output from previous day: 08/06 0701 - 08/07 0700 In: 40 [I.V.:40] Out: 150 [Urine:150] Intake/Output from this shift: Total I/O In: 40 [I.V.:40] Out: 150 [Urine:150]  Labs:  Recent Labs  02/04/2015 2258 02/07/2015 2300  WBC 21.6*  --   HGB 12.8* 14.6  PLT 210  --   CREATININE 2.55* 2.20*   Estimated Creatinine Clearance: 34.3 mL/min (by C-G formula based on Cr of 2.2).  Medical History: Past Medical History  Diagnosis Date  . CHF (congestive heart failure)     EF 30-35%, nonischemic  . CAD (coronary artery disease)   . Hyperlipidemia     hypertriglyceridemia on niaspan and lipitor.  . Asthma   . Allergy   . PVD (peripheral vascular disease)     s/p left AKA @ [facility] (MRSA septic arthritis)  . Diabetes mellitus     dx in 2009, DKA, came unresponsive  . Tobacco abuse   . Hepatitis C   . Hypertension   . LBBB (left bundle branch block)   . Anemia of chronic disease     baseline Hgb now 13  . Thrombocytopenia     07/2007, went down to 90,000. likely 2/2 neurontin.  . Cocaine use     s/p cardiopulmonary arrest 12/25/2002 ([facility])  . Cholelithiasis     documented on CT abdomen in 07/2007  . Non-ischemic cardiomyopathy 04/2014    BiV-ICD placed    Assessment: [name] s/p cardiac arrest to start Unasyn for possible aspiration PNA. WBC elevated, noted renal dysfunction. Pt on arctic sun protocol.   Plan:  -Unasyn 3g IV q8h -Trend WBC, temp, renal function   Abran Duke 01/28/2015,2:50 AM

## 2015-01-28 NOTE — Progress Notes (Signed)
eLink Physician-Brief Progress Note Patient Name: ZIDANE ROSHELL DOB: 1946-02-07 MRN: [id number]   Date of Service  01/28/2015  HPI/Events of Note  K=3.2  eICU Interventions  Replaced.      Intervention Category Intermediate Interventions: Electrolyte abnormality - evaluation and management  Lielle Vandervort 01/28/2015, 9:30 PM

## 2015-01-28 NOTE — Progress Notes (Addendum)
ANTICOAGULATION CONSULT NOTE - Follow Up Consult  Pharmacy Consult for heparin Indication: atrial fibrillation, cardiac arrest  No Known Allergies  Patient Measurements: Height: 6' (182.9 cm) Weight: 191 lb 12.8 oz (87 kg) IBW/kg (Calculated) : 77.6  Vital Signs: Temp: 91.4 F (33 C) (08/07 1400) Temp Source: Core (Comment) (08/07 1200) BP: 130/77 mmHg (08/07 1400) Pulse Rate: 50 (08/07 1400)  Labs:  Recent Labs  02/10/2015 2258  01/28/15 0433  01/28/15 0532 01/28/15 0640 01/28/15 0830 01/28/15 0846 01/28/15 0911 01/28/15 1200  HGB 12.8*  < >  --   < > 12.5* 14.3  --   --  13.6  --   HCT 39.9  < >  --   < > 38.1* 42.0  --   --  40.0  --   PLT 210  --   --   --  182  --   --   --   --   --   APTT  --   --  31  --   --   --  55*  --   --   --   LABPROT 16.3*  --   --   --   --   --   --  17.3*  --   --   INR 1.29  --   --   --   --   --   --  1.41  --   --   HEPARINUNFRC  --   --   --   --   --   --   --   --   --  <0.10*  CREATININE 2.55*  < >  --   < > 2.16* 2.20*  --   --  2.30*  --   TROPONINI 0.10*  --   --   --   --   --   --   --   --   --   < > = values in this interval not displayed.  Estimated Creatinine Clearance: 32.8 mL/min (by C-G formula based on Cr of 2.3).   Assessment: 69 yo m admitted s/p cardiac arrest.  Pharmacy is consulted to dose heparin for afib.  HL today is undetectable on 700 units/hr.  No issues per RN. CBC good. Patient is cooled to 33 degrees.  Goal of Therapy:  Heparin level 0.3-0.7 units/ml Monitor platelets by anticoagulation protocol: Yes   Plan:  Heparin bolus 2,500 units x 1 Increase heparin infusion to 1,000 units/hr F/u 8-hr HL @ 2300 Monitor hgb/plts, s/s of bleeding, rewarming process  Cassie L. Roseanne Reno, PharmD Clinical Pharmacy Resident Pager: [phone] 01/28/2015 2:47 PM

## 2015-01-28 NOTE — Procedures (Signed)
Central Venous Catheter Insertion Procedure Note Jonathon Galvan [id number] Mar 16, 1946  Procedure: Insertion of Central Venous Catheter Indications: Assessment of intravascular volume, Drug and/or fluid administration and Frequent blood sampling  Procedure Details Consent: Unable to obtain consent because of altered level of consciousness. Time Out: Verified patient identification, verified procedure, site/side was marked, verified correct patient position, special equipment/implants available, medications/allergies/relevent history reviewed, required imaging and test results available.  Performed Real time [place] was used to ID and cannulate the vessel  Maximum sterile technique was used including antiseptics, cap, gloves, gown, hand hygiene, mask and sheet. Skin prep: Chlorhexidine; local anesthetic administered A antimicrobial bonded/coated triple lumen catheter was placed in the left internal jugular vein using the Seldinger technique.  Evaluation Blood flow good Complications: No apparent complications Patient did tolerate procedure well. Chest X-ray ordered to verify placement.  CXR: pending.  Jonathon Galvan,Jonathon Galvan 01/28/2015, 2:26 AM

## 2015-01-28 NOTE — ED Notes (Signed)
Ice packs applied to groin, axillary. Dr. Criss Alvine, MD advises target of 36 celcius. Notified pharmacy for code cool medications. Currently en route.

## 2015-01-28 NOTE — Progress Notes (Signed)
eLink Physician-Brief Progress Note Patient Name: Jonathon Galvan DOB: March 04, 1946 MRN: [id number]   Date of Service  01/28/2015  HPI/Events of Note  Patient has had a prior cardiac arrest associated with cocaine use.   eICU Interventions  Will order: 1. UDS now.      Intervention Category Minor Interventions: Clinical assessment - ordering diagnostic tests  Fred Franzen Dennard Nip 01/28/2015, 3:40 AM

## 2015-01-28 NOTE — ED Notes (Signed)
intensivist spoke with [employer] in regards to interogation.

## 2015-01-28 NOTE — H&P (Signed)
PULMONARY / CRITICAL CARE MEDICINE   Name: Jonathon Galvan MRN: [id number] DOB: April 25, 1946    ADMISSION DATE:  02/13/2015 CONSULTATION DATE:  8/6  REFERRING MD :  EDP   CHIEF COMPLAINT:  Cardiac arrest   INITIAL PRESENTATION:   69 year old male w/ known CM called EMS emergently on 8/6 w/ CC: dyspnea. At time of EMS arrival was found in PEA. HR in 58s, ACLS initiated. Received 4 rounds of Epinephrine, 1 amp D 50, and 300 amio when HR went into wide complex rhythm. ROSC estimated at least 12 minutes. Arrived to ER unresponsive w/ ambu-bag assisted ventilation, w/ HR 50s Sinus brady. He was intubated by EDP. PCCM asked to admit.   ** [employer] tech reports slow AF prior to arrest. His AICD is set at 200, it did not shock the pt.  STUDIES:  CT head 8/7>>> ECHO 8/7>>>  SIGNIFICANT EVENTS: Cardiac arrest 8/6>>>    HISTORY OF PRESENT ILLNESS:   See above   PAST MEDICAL HISTORY :   has a past medical history of CHF (congestive heart failure); CAD (coronary artery disease); Hyperlipidemia; Asthma; Allergy; PVD (peripheral vascular disease); Diabetes mellitus; Tobacco abuse; Hepatitis C; Hypertension; LBBB (left bundle branch block); Anemia of chronic disease; Thrombocytopenia; Cocaine use; Cholelithiasis; and Non-ischemic cardiomyopathy (04/2014).  has past surgical history that includes left leg MRSA infection s/p AKA; left shoulder cyst removal; BiV-ICD (CRT-D) (05/19/14); left heart catheterization with coronary angiogram (N/A, 05/04/2014); and bi-ventricular implantable cardioverter defibrillator (N/A, 05/19/2014). Prior to Admission medications   Medication Sig Start Date End Date Taking? Authorizing Provider  gabapentin (NEURONTIN) 300 MG capsule Take 300 mg by mouth 3 (three) times daily.   Yes Historical Provider, MD  amitriptyline (ELAVIL) 100 MG tablet Take 100 mg by mouth at bedtime.    Historical Provider, MD  aspirin EC 81 MG tablet Take 1 tablet (81 mg total) by mouth daily.  05/21/14   Nishant Dhungel, MD  atorvastatin (LIPITOR) 10 MG tablet Take 1 tablet (10 mg total) by mouth daily. 10/03/11   Lars Mage, MD  carvedilol (COREG) 12.5 MG tablet Take 2 tablets (25 mg total) by mouth 2 (two) times daily with a meal. 05/21/14   Nishant Dhungel, MD  cetirizine (ZYRTEC) 10 MG tablet Take 1 tablet (10 mg total) by mouth daily. 10/03/11   Lars Mage, MD  enalapril (VASOTEC) 2.5 MG tablet Take 1 tablet (2.5 mg total) by mouth daily. 05/21/14   Nishant Dhungel, MD  furosemide (LASIX) 80 MG tablet Take 0.5 tablets (40 mg total) by mouth 2 (two) times daily. 05/21/14   Nishant Dhungel, MD  glipiZIDE (GLUCOTROL) 10 MG tablet Take 1 tablet by mouth daily before breakfast.  08/11/13   Historical Provider, MD  isosorbide-hydrALAZINE (BIDIL) 20-37.5 MG per tablet Take 2 tablets by mouth 2 (two) times daily. 05/21/14   Nishant Dhungel, MD  metFORMIN (GLUCOPHAGE) 1000 MG tablet Take 1 tablet (1,000 mg total) by mouth 2 (two) times daily with a meal. 05/14/11   Lars Mage, MD  niacin (NIASPAN) 500 MG CR tablet Take 500 mg by mouth at bedtime.  08/11/13   Historical Provider, MD  oxyCODONE-acetaminophen (PERCOCET) 10-325 MG per tablet Take 1 tablet by mouth every 12 (twelve) hours as needed for pain. 11/12/14   Shanker Levora Dredge, MD  venlafaxine (EFFEXOR) 50 MG tablet Take 1 tablet (50 mg total) by mouth at bedtime. 10/03/11   Lars Mage, MD  zolpidem (AMBIEN) 10 MG tablet Take 10 mg by mouth at  bedtime as needed for sleep.    Historical Provider, MD   No Known Allergies  FAMILY HISTORY:  indicated that his mother is deceased. He indicated that his father is deceased.  SOCIAL HISTORY:  reports that he has been smoking.  He does not have any smokeless tobacco history on file. He reports that he does not drink alcohol or use illicit drugs.  REVIEW OF SYSTEMS:  Unable    VITAL SIGNS: Temp:  [96.6 F (35.9 C)-98.3 F (36.8 C)] 96.6 F (35.9 C) (08/07 0200) Pulse Rate:  [53-69] 53 (08/07  0200) Resp:  [16-23] 18 (08/07 0200) BP: (130-160)/(69-98) 156/89 mmHg (08/07 0200) SpO2:  [93 %-100 %] 100 % (08/07 0200) Arterial Line BP: (139-170)/(80-150) 139/134 mmHg (08/07 0200) FiO2 (%):  [100 %] 100 % (08/07 0047) Weight:  [83.399 kg (183 lb 13.8 oz)] 83.399 kg (183 lb 13.8 oz) (08/06 2300) HEMODYNAMICS:   VENTILATOR SETTINGS: Vent Mode:  [-] PRVC FiO2 (%):  [100 %] 100 % Set Rate:  [16 bmp-18 bmp] 18 bmp Vt Set:  [620 mL] 620 mL PEEP:  [5 cmH20] 5 cmH20 Plateau Pressure:  [21 cmH20-22 cmH20] 22 cmH20 INTAKE / OUTPUT:  Intake/Output Summary (Last 24 hours) at 01/28/15 0220 Last data filed at 01/28/15 0200  Gross per 24 hour  Intake     40 ml  Output    150 ml  Net   -110 ml    PHYSICAL EXAMINATION: General:  Well nourished AAM currently on full vent support  Neuro:  sedated HEENT:  Orally intubated  Cardiovascular:  SB  Lungs: diffuse course rhonchi  Abdomen:  Soft, non-tender + bowel sounds  Musculoskeletal:  Left AKA  Skin:  Warm, intact   LABS:  CBC  Recent Labs Lab 01/26/2015 2258 02/04/2015 2300  WBC 21.6*  --   HGB 12.8* 14.6  HCT 39.9 43.0  PLT 210  --    Coag's  Recent Labs Lab 02/13/2015 2258  INR 1.29   BMET  Recent Labs Lab 01/22/2015 2258 02/21/2015 2300  NA 136 143  K 3.1* 3.2*  CL 101 102  CO2 20*  --   BUN 10 13  CREATININE 2.55* 2.20*  GLUCOSE 226* 232*   Electrolytes  Recent Labs Lab 02/21/2015 2258  CALCIUM 7.9*   Sepsis Markers  Recent Labs Lab 01/23/2015 2301  LATICACIDVEN 5.77*   ABG  Recent Labs Lab 02/02/2015 2327 01/28/15 0031  PHART 7.248* 7.353  PCO2ART 53.1* 46.4*  PO2ART 100.0 120.0*   Liver Enzymes  Recent Labs Lab 02/21/2015 2258  AST 34  ALT 15*  ALKPHOS 57  BILITOT 1.1  ALBUMIN 3.0*   Cardiac Enzymes  Recent Labs Lab 02/17/2015 2258  TROPONINI 0.10*   Glucose  Recent Labs Lab 02/08/2015 2300  GLUCAP 201*    Imaging Dg Chest Port 1 View  02/10/2015   CLINICAL DATA:  Status post  cardiac arrest.  Initial encounter.  EXAM: PORTABLE CHEST - 1 VIEW  COMPARISON:  Chest radiograph performed 11/11/2014  FINDINGS: The patient's endotracheal tube is seen ending 3-4 cm above the carina. The enteric tube is noted extending below the diaphragm.  Vascular congestion is noted, with mildly increased interstitial markings, raising question for mild interstitial edema. A small left pleural effusion is suspected. No pneumothorax is seen, though the lung apices are incompletely imaged on this study.  The cardiomediastinal silhouette is mildly enlarged. A pacemaker/AICD is noted overlying the left chest wall, with leads ending overlying the right atrium, right  ventricle and coronary sinus. No acute osseous abnormalities are identified. External pacing pads are seen.  IMPRESSION: 1. Endotracheal tube seen ending 3-4 cm above the carina. 2. Vascular congestion and mild cardiomegaly, with mildly increased interstitial markings, raising question for mild interstitial edema. Suggestion of small left pleural effusion.   Electronically Signed   By: Roanna Raider M.D.   On: 01/28/2015 23:54     ASSESSMENT / PLAN:  PULMONARY OETT 8/6>>> A: Acute hypoxic respiratory failure s/p cardiopulmonary arrest.  Pulmonary infiltrates. Likely edema but need to r/o aspiration  P:   Full vent support  Sputum culture F/u abg See Neuro and ID section   CARDIOVASCULAR CVL  A: PEA arrest H/o cad PAF  Prior cardiac arrest NICM w/ EF 30-35% w/ AICD  P:  Lasix x 1 Heparin for AF and ACS Cont asa Hold bb given bradycardia Cards consulted.  ECHO   RENAL A:  Stage 3 CKD Acute on chronic renal failure  Hypokalemia  Lactic acidosis  P:   Cont IVFs Replace K  Renal dose meds F/u chemistry   GASTROINTESTINAL A:   NO acute  P:   PPI  Start tube feeds in am   HEMATOLOGIC A:   No acute  P:  Trend CBC  [place] heparin   INFECTIOUS A:   Leukocytosis  P:   BCx2 8/7>>> Sputum 8/7>>> unasyn 8/7  (possible aspiration)>>> PCT algo and stop if PCT nml   ENDOCRINE A:   DM type 2 w/ hyperglycemia  P:   ssi   NEUROLOGIC A:   Acute encephalopathy  P:   RASS goal: -4 Hypothermia protocol    FAMILY  - Updates:   - Inter-disciplinary family meet or Palliative Care meeting due by: 8/14    TODAY'S SUMMARY: PEA arrest in pt w/ known NICM. Pulmonary edema on CXR. Mild trop leak. [employer] reports transient AF. Down time at least 12 minutes to ROSC. Will initiate hypothermia protocol, scan head to ensure no neurological event and if negative start IV heparin. Cards called and consult still pending. Will support medically given underlying renal disease.    Simonne Martinet ACNP-BC [facility] Pulmonary/Critical Care Pager # [phone] OR # [phone] if no answer   01/28/2015, 2:20 AM

## 2015-01-28 NOTE — Progress Notes (Signed)
ANTICOAGULATION CONSULT NOTE - Initial Consult  Pharmacy Consult for Heparin  Indication: atrial fibrillation, cardiac arrest  No Known Allergies  Patient Measurements: Height: 6' (182.9 cm) Weight: 183 lb 13.8 oz (83.399 kg) IBW/kg (Calculated) : 77.6  Vital Signs: Temp: 93.9 F (34.4 C) (08/07 0310) Temp Source: Core (Comment) (08/07 0130) BP: 179/97 mmHg (08/07 0303) Pulse Rate: 51 (08/07 0310)  Labs:  Recent Labs  02/07/2015 2258 01/25/2015 2300  HGB 12.8* 14.6  HCT 39.9 43.0  PLT 210  --   LABPROT 16.3*  --   INR 1.29  --   CREATININE 2.55* 2.20*  TROPONINI 0.10*  --     Estimated Creatinine Clearance: 34.3 mL/min (by C-G formula based on Cr of 2.2).   Medical History: Past Medical History  Diagnosis Date  . CHF (congestive heart failure)     EF 30-35%, nonischemic  . CAD (coronary artery disease)   . Hyperlipidemia     hypertriglyceridemia on niaspan and lipitor.  . Asthma   . Allergy   . PVD (peripheral vascular disease)     s/p left AKA @ [facility] (MRSA septic arthritis)  . Diabetes mellitus     dx in 2009, DKA, came unresponsive  . Tobacco abuse   . Hepatitis C   . Hypertension   . LBBB (left bundle branch block)   . Anemia of chronic disease     baseline Hgb now 13  . Thrombocytopenia     07/2007, went down to 90,000. likely 2/2 neurontin.  . Cocaine use     s/p cardiopulmonary arrest 12/25/2002 ([facility])  . Cholelithiasis     documented on CT abdomen in 07/2007  . Non-ischemic cardiomyopathy 04/2014    BiV-ICD placed    Assessment: [name] s/p cardiac arrest with ROSC, starting heparin for afib, CBC good, noted renal dysfunction, baseline INR 1.29, other labs reviewed. Pt is on the 33 degree arctic sun protocol so will use reduced heparin dosing.   Goal of Therapy:  Heparin level 0.3-0.7 units/ml Monitor platelets by anticoagulation protocol: Yes   Plan:  -Heparin 2500 units BOLUS -Start heparin drip at 700 units/hr -1200  HL -Daily CBC/HL -Monitor for bleeding  Jonathon Galvan 01/28/2015,3:36 AM

## 2015-01-28 NOTE — Progress Notes (Signed)
eLink Physician-Brief Progress Note Patient Name: Jonathon Galvan DOB: 1946-01-15 MRN: [id number]   Date of Service  01/28/2015  HPI/Events of Note  Multiple issues: 1. K+ = 3.2 and Creatinine = 2.16 and 2. UDS is positive for Cocaine.   eICU Interventions  Will cautiously replace K+.     Intervention Category Intermediate Interventions: Electrolyte abnormality - evaluation and management;Other:  Sommer,Steven Dennard Nip 01/28/2015, 6:26 AM

## 2015-01-28 NOTE — Progress Notes (Signed)
Patient seen and examined. Chart reviewed. UDS + for cocaine.  Agree with Consult note by Dr. Loney Loh. Continue supportive care. Echo pending.   Jonathon Cheslock,MD 11:04 AM

## 2015-01-28 NOTE — Consult Note (Signed)
Referring Physician: Dr. Craige Cotta  Reason for Consultation: "cardiac arrest PEA s/p hypthermia"   HPI: 69 y/o man with pmh systolic f CHF (congestive heart failure); CAD (coronary artery disease); Hyperlipidemia; Asthma; Allergy; PVD (peripheral vascular disease); Diabetes mellitus; Tobacco abuse; Hepatitis C; Hypertension; LBBB (left bundle branch block); Anemia of chronic disease; Thrombocytopenia; Cocaine use; Cholelithiasis; and Non-ischemic cardiomyopathy (04/2014).  has past surgical history that includes left leg MRSA infection s/p AKA; left shoulder cyst removal; BiV-ICD (CRT-D) (05/19/14); left heart catheterization with coronary angiogram (N/A, 05/04/2014); and bi-ventricular implantable cardioverter defibrillator (N/A, 05/19/2014). Per chart "At time of EMS arrival was found in PEA. HR in 58s, ACLS initiated. Received 4 rounds of Epinephrine, 1 amp D 50, and 300 amio when HR went into wide complex rhythm. ROSC estimated at least 12 minutes. Arrived to ER unresponsive w/ ambu-bag assisted ventilation, w/ HR 50s Sinus brady. He was intubated by EDP"  He was started on  Hypothermia and cardiology consulted Currently in SR, intubated and sedated  ROS unable to obtain  Past Medical History  Diagnosis Date  . CHF (congestive heart failure)     EF 30-35%, nonischemic  . CAD (coronary artery disease)   . Hyperlipidemia     hypertriglyceridemia on niaspan and lipitor.  . Asthma   . Allergy   . PVD (peripheral vascular disease)     s/p left AKA @ [facility] (MRSA septic arthritis)  . Diabetes mellitus     dx in 2009, DKA, came unresponsive  . Tobacco abuse   . Hepatitis C   . Hypertension   . LBBB (left bundle branch block)   . Anemia of chronic disease     baseline Hgb now 13  . Thrombocytopenia     07/2007, went down to 90,000. likely 2/2 neurontin.  . Cocaine use     s/p cardiopulmonary arrest 12/25/2002 ([facility])  . Cholelithiasis     documented on CT abdomen in 07/2007    . Non-ischemic cardiomyopathy 04/2014    BiV-ICD placed    Medications Prior to Admission  Medication Sig Dispense Refill  . gabapentin (NEURONTIN) 300 MG capsule Take 300 mg by mouth 3 (three) times daily.    Marland Kitchen amitriptyline (ELAVIL) 100 MG tablet Take 100 mg by mouth at bedtime.    Marland Kitchen aspirin EC 81 MG tablet Take 1 tablet (81 mg total) by mouth daily. 30 tablet 0  . atorvastatin (LIPITOR) 10 MG tablet Take 1 tablet (10 mg total) by mouth daily. 30 tablet 5  . carvedilol (COREG) 12.5 MG tablet Take 2 tablets (25 mg total) by mouth 2 (two) times daily with a meal. 30 tablet 0  . cetirizine (ZYRTEC) 10 MG tablet Take 1 tablet (10 mg total) by mouth daily. 30 tablet 5  . enalapril (VASOTEC) 2.5 MG tablet Take 1 tablet (2.5 mg total) by mouth daily. 30 tablet 0  . furosemide (LASIX) 80 MG tablet Take 0.5 tablets (40 mg total) by mouth 2 (two) times daily. 60 tablet 11  . glipiZIDE (GLUCOTROL) 10 MG tablet Take 1 tablet by mouth daily before breakfast.     . isosorbide-hydrALAZINE (BIDIL) 20-37.5 MG per tablet Take 2 tablets by mouth 2 (two) times daily. 120 tablet 0  . metFORMIN (GLUCOPHAGE) 1000 MG tablet Take 1 tablet (1,000 mg total) by mouth 2 (two) times daily with a meal. 60 tablet 11  . niacin (NIASPAN) 500 MG CR tablet Take 500 mg by mouth at bedtime.     Marland Kitchen  oxyCODONE-acetaminophen (PERCOCET) 10-325 MG per tablet Take 1 tablet by mouth every 12 (twelve) hours as needed for pain. 30 tablet 0  . venlafaxine (EFFEXOR) 50 MG tablet Take 1 tablet (50 mg total) by mouth at bedtime. 30 tablet 5  . zolpidem (AMBIEN) 10 MG tablet Take 10 mg by mouth at bedtime as needed for sleep.       . sodium chloride  2,000 mL Intravenous Once  . amitriptyline  100 mg Per Tube QHS  . ampicillin-sulbactam (UNASYN) IV  3 g Intravenous Q8H  . artificial tears  1 application Both Eyes 3 times per day  . aspirin  81 mg Per Tube Daily  . atorvastatin  10 mg Per Tube Daily  . Chlorhexidine Gluconate Cloth  6  each Topical Q0600  . cisatracurium  0.1 mg/kg Intravenous Once  . etomidate      . fentaNYL (SUBLIMAZE) injection  50 mcg Intravenous Once  . insulin aspart  0-15 Units Subcutaneous 6 times per day  . lidocaine (cardiac) 100 mg/11ml      . mupirocin ointment  1 application Nasal BID  . potassium chloride  10 mEq Intravenous Q1 Hr x 4  . propofol      . rocuronium      . succinylcholine        Infusions: . cisatracurium (NIMBEX) infusion 1 mcg/kg/min (01/28/15 0130)  . fentaNYL infusion INTRAVENOUS 200 mcg/hr (01/28/15 0300)  . heparin 700 Units/hr (01/28/15 0420)  . norepinephrine (LEVOPHED) Adult infusion    . propofol (DIPRIVAN) infusion 30 mcg/kg/min (01/28/15 0400)    No Known Allergies  History   Social History  . Marital Status: Single    Spouse Name: N/A  . Number of Children: N/A  . Years of Education: N/A   Occupational History  . Not on file.   Social History Main Topics  . Smoking status: Current Some Day Smoker -- 3 years  . Smokeless tobacco: Not on file  . Alcohol Use: No  . Drug Use: No  . Sexual Activity: Not on file   Other Topics Concern  . Not on file   Social History Narrative    Patient was given a prescription of zoster vaccine on 09/16/2010    Family History  Problem Relation Age of Onset  . Coronary artery disease Mother     died in her 81's  . Cirrhosis Father     died from complications  . Coronary artery disease Brother     died of fatal MI    PHYSICAL EXAM: Filed Vitals:   01/28/15 0530  BP:   Pulse:   Temp: 89.6 F (32 C)  Resp:      Intake/Output Summary (Last 24 hours) at 01/28/15 0546 Last data filed at 01/28/15 0500  Gross per 24 hour  Intake   1594 ml  Output   1250 ml  Net    344 ml    General:  intubated HEENT: normal Neck: supple.  Cor: PMI nondisplaced. Regular rate & rhythm. No rubs, gallops or murmurs. Lungs: clear good air entry b/l anteriorly on vent Abdomen: soft, nontender, nondistended. No  hepatosplenomegaly.  Extremities: no cyanosis, clubbing, rash, edema  ECG:  Results for orders placed or performed during the hospital encounter of 2015-02-13 (from the past 24 hour(s))  Comprehensive metabolic panel     Status: Abnormal   Collection Time: February 13, 2015 10:58 PM  Result Value Ref Range   Sodium 136 135 - 145 mmol/L   Potassium 3.1 (  L) 3.5 - 5.1 mmol/L   Chloride 101 101 - 111 mmol/L   CO2 20 (L) 22 - 32 mmol/L   Glucose, Bld 226 (H) 65 - 99 mg/dL   BUN 10 6 - 20 mg/dL   Creatinine, Ser 1.61 (H) 0.61 - 1.24 mg/dL   Calcium 7.9 (L) 8.9 - 10.3 mg/dL   Total Protein 6.4 (L) 6.5 - 8.1 g/dL   Albumin 3.0 (L) 3.5 - 5.0 g/dL   AST 34 15 - 41 U/L   ALT 15 (L) 17 - 63 U/L   Alkaline Phosphatase 57 38 - 126 U/L   Total Bilirubin 1.1 0.3 - 1.2 mg/dL   GFR calc non Af Amer 24 (L) >60 mL/min   GFR calc Af Amer 28 (L) >60 mL/min   Anion gap 15 5 - 15  Ethanol     Status: None   Collection Time: 02/06/2015 10:58 PM  Result Value Ref Range   Alcohol, Ethyl (B) <5 <5 mg/dL  Troponin I     Status: Abnormal   Collection Time: 02/20/2015 10:58 PM  Result Value Ref Range   Troponin I 0.10 (H) <0.031 ng/mL  CBC with Differential     Status: Abnormal   Collection Time: 02/01/2015 10:58 PM  Result Value Ref Range   WBC 21.6 (H) 4.0 - 10.5 K/uL   RBC 4.81 4.22 - 5.81 MIL/uL   Hemoglobin 12.8 (L) 13.0 - 17.0 g/dL   HCT 09.6 04.5 - 40.9 %   MCV 83.0 78.0 - 100.0 fL   MCH 26.6 26.0 - 34.0 pg   MCHC 32.1 30.0 - 36.0 g/dL   RDW 81.1 91.4 - 78.2 %   Platelets 210 150 - 400 K/uL   Neutrophils Relative % 65 43 - 77 %   Neutro Abs 13.8 (H) 1.7 - 7.7 K/uL   Lymphocytes Relative 25 12 - 46 %   Lymphs Abs 5.4 (H) 0.7 - 4.0 K/uL   Monocytes Relative 7 3 - 12 %   Monocytes Absolute 1.6 (H) 0.1 - 1.0 K/uL   Eosinophils Relative 3 0 - 5 %   Eosinophils Absolute 0.7 0.0 - 0.7 K/uL   Basophils Relative 0 0 - 1 %   Basophils Absolute 0.1 0.0 - 0.1 K/uL  Protime-INR     Status: Abnormal   Collection  Time: 02/08/2015 10:58 PM  Result Value Ref Range   Prothrombin Time 16.3 (H) 11.6 - 15.2 seconds   INR 1.29 0.00 - 1.49  Brain natriuretic peptide     Status: Abnormal   Collection Time: 01/23/2015 10:58 PM  Result Value Ref Range   B Natriuretic Peptide 2544.6 (H) 0.0 - 100.0 pg/mL  I-stat troponin, ED     Status: Abnormal   Collection Time: 02/08/2015 10:59 PM  Result Value Ref Range   Troponin i, poc 0.11 (HH) 0.00 - 0.08 ng/mL   Comment NOTIFIED PHYSICIAN    Comment 3          I-stat chem 8, ed     Status: Abnormal   Collection Time: 02/19/2015 11:00 PM  Result Value Ref Range   Sodium 143 135 - 145 mmol/L   Potassium 3.2 (L) 3.5 - 5.1 mmol/L   Chloride 102 101 - 111 mmol/L   BUN 13 6 - 20 mg/dL   Creatinine, Ser 9.56 (H) 0.61 - 1.24 mg/dL   Glucose, Bld 213 (H) 65 - 99 mg/dL   Calcium, Ion 0.86 (L) 1.13 - 1.30 mmol/L   TCO2  22 0 - 100 mmol/L   Hemoglobin 14.6 13.0 - 17.0 g/dL   HCT 31.5 17.6 - 16.0 %  CBG monitoring, ED     Status: Abnormal   Collection Time: 02/07/2015 11:00 PM  Result Value Ref Range   Glucose-Capillary 201 (H) 65 - 99 mg/dL  I-Stat CG4 Lactic Acid, ED     Status: Abnormal   Collection Time: 02/20/2015 11:01 PM  Result Value Ref Range   Lactic Acid, Venous 5.77 (HH) 0.5 - 2.0 mmol/L   Comment NOTIFIED PHYSICIAN   I-Stat arterial blood gas, ED     Status: Abnormal   Collection Time: 02/13/2015 11:27 PM  Result Value Ref Range   pH, Arterial 7.248 (L) 7.350 - 7.450   pCO2 arterial 53.1 (H) 35.0 - 45.0 mmHg   pO2, Arterial 100.0 80.0 - 100.0 mmHg   Bicarbonate 23.2 20.0 - 24.0 mEq/L   TCO2 25 0 - 100 mmol/L   O2 Saturation 97.0 %   Acid-base deficit 5.0 (H) 0.0 - 2.0 mmol/L   Patient temperature 98.3 F    Collection site RADIAL, ALLEN'S TEST ACCEPTABLE    Drawn by Operator    Sample type ARTERIAL   Type and screen     Status: None   Collection Time: 02/17/2015 11:29 PM  Result Value Ref Range   ABO/RH(D) A POS    Antibody Screen NEG    Sample Expiration  01/30/2015   Urinalysis, Routine w reflex microscopic (not at [facility])     Status: Abnormal   Collection Time: 01/28/15 12:00 AM  Result Value Ref Range   Color, Urine YELLOW YELLOW   APPearance CLOUDY (A) CLEAR   Specific Gravity, Urine 1.011 1.005 - 1.030   pH 6.5 5.0 - 8.0   Glucose, UA 100 (A) NEGATIVE mg/dL   Hgb urine dipstick MODERATE (A) NEGATIVE   Bilirubin Urine NEGATIVE NEGATIVE   Ketones, ur NEGATIVE NEGATIVE mg/dL   Protein, ur >737 (A) NEGATIVE mg/dL   Urobilinogen, UA 0.2 0.0 - 1.0 mg/dL   Nitrite NEGATIVE NEGATIVE   Leukocytes, UA NEGATIVE NEGATIVE  Urine microscopic-add on     Status: Abnormal   Collection Time: 01/28/15 12:00 AM  Result Value Ref Range   Squamous Epithelial / LPF RARE RARE   RBC / HPF 11-20 <3 RBC/hpf   Bacteria, UA MANY (A) RARE   Urine-Other AMORPHOUS URATES/PHOSPHATES   I-Stat arterial blood gas, ED     Status: Abnormal   Collection Time: 01/28/15 12:31 AM  Result Value Ref Range   pH, Arterial 7.353 7.350 - 7.450   pCO2 arterial 46.4 (H) 35.0 - 45.0 mmHg   pO2, Arterial 120.0 (H) 80.0 - 100.0 mmHg   Bicarbonate 25.8 (H) 20.0 - 24.0 mEq/L   TCO2 27 0 - 100 mmol/L   O2 Saturation 98.0 %   Patient temperature 98.3 F    Collection site ARTERIAL LINE    Drawn by Operator    Sample type ARTERIAL   MRSA PCR Screening     Status: Abnormal   Collection Time: 01/28/15  1:32 AM  Result Value Ref Range   MRSA by PCR POSITIVE (A) NEGATIVE  Procalcitonin - Baseline     Status: None   Collection Time: 01/28/15  1:40 AM  Result Value Ref Range   Procalcitonin 2.21 ng/mL  Magnesium     Status: None   Collection Time: 01/28/15  1:40 AM  Result Value Ref Range   Magnesium 2.0 1.7 - 2.4 mg/dL  Phosphorus  Status: None   Collection Time: 01/28/15  1:40 AM  Result Value Ref Range   Phosphorus 3.5 2.5 - 4.6 mg/dL  Glucose, capillary     Status: Abnormal   Collection Time: 01/28/15  1:41 AM  Result Value Ref Range   Glucose-Capillary 240 (H) 65 -  99 mg/dL  I-STAT, chem 8     Status: Abnormal   Collection Time: 01/28/15  1:44 AM  Result Value Ref Range   Sodium 138 135 - 145 mmol/L   Potassium 3.1 (L) 3.5 - 5.1 mmol/L   Chloride 110 101 - 111 mmol/L   BUN 14 6 - 20 mg/dL   Creatinine, Ser 1.61 (H) 0.61 - 1.24 mg/dL   Glucose, Bld 096 (H) 65 - 99 mg/dL   Calcium, Ion 0.45 (L) 1.13 - 1.30 mmol/L   TCO2 23 0 - 100 mmol/L   Hemoglobin 13.9 13.0 - 17.0 g/dL   HCT 40.9 81.1 - 91.4 %  Glucose, capillary     Status: Abnormal   Collection Time: 01/28/15  3:07 AM  Result Value Ref Range   Glucose-Capillary 211 (H) 65 - 99 mg/dL  Glucose, capillary     Status: Abnormal   Collection Time: 01/28/15  4:23 AM  Result Value Ref Range   Glucose-Capillary 213 (H) 65 - 99 mg/dL  Urine rapid drug screen (hosp performed)     Status: Abnormal   Collection Time: 01/28/15  4:32 AM  Result Value Ref Range   Opiates NONE DETECTED NONE DETECTED   Cocaine POSITIVE (A) NONE DETECTED   Benzodiazepines NONE DETECTED NONE DETECTED   Amphetamines NONE DETECTED NONE DETECTED   Tetrahydrocannabinol NONE DETECTED NONE DETECTED   Barbiturates NONE DETECTED NONE DETECTED  APTT now and repeat in 8 hours     Status: None   Collection Time: 01/28/15  4:33 AM  Result Value Ref Range   aPTT 31 24 - 37 seconds  I-STAT, chem 8     Status: Abnormal   Collection Time: 01/28/15  4:48 AM  Result Value Ref Range   Sodium 144 135 - 145 mmol/L   Potassium 3.1 (L) 3.5 - 5.1 mmol/L   Chloride 107 101 - 111 mmol/L   BUN 14 6 - 20 mg/dL   Creatinine, Ser 7.82 (H) 0.61 - 1.24 mg/dL   Glucose, Bld 956 (H) 65 - 99 mg/dL   Calcium, Ion 2.13 (L) 1.13 - 1.30 mmol/L   TCO2 18 0 - 100 mmol/L   Hemoglobin 15.0 13.0 - 17.0 g/dL   HCT 08.6 57.8 - 46.9 %   Ct Head Wo Contrast  01/28/2015   CLINICAL DATA:  Unresponsive  EXAM: CT HEAD WITHOUT CONTRAST  TECHNIQUE: Contiguous axial images were obtained from the base of the skull through the vertex without intravenous contrast.   COMPARISON:  05/09/2014  FINDINGS: There is no intracranial hemorrhage, mass or evidence of acute infarction. There is mild generalized atrophy. There is moderate chronic microvascular ischemic change. There is no significant extra-axial fluid collection.  No acute intracranial findings are evident. No bony abnormality is evident.  IMPRESSION: Mild atrophy and moderate generalized white matter hypodensity consistent with small vessel disease. No acute intracranial findings.   Electronically Signed   By: Ellery Plunk M.D.   On: 01/28/2015 03:00   Dg Chest Port 1 View  01/28/2015   CLINICAL DATA:  Central line placement  EXAM: PORTABLE CHEST - 1 VIEW  COMPARISON:  02-26-15  FINDINGS: Endotracheal tube is 1-2 cm above the  carina. Nasogastric tube extends into the stomach. There is a new left jugular central line with tip in the low SVC. There is no pneumothorax. Central and basilar airspace opacities persist, and there is a moderate right pleural effusion which is probably larger.  IMPRESSION: New left jugular central line. No pneumothorax. Enlarging right pleural effusion. Unchanged central low aedema or infiltrate.   Electronically Signed   By: Ellery Plunk M.D.   On: 01/28/2015 03:02   Dg Chest Port 1 View  2015/01/31   CLINICAL DATA:  Status post cardiac arrest.  Initial encounter.  EXAM: PORTABLE CHEST - 1 VIEW  COMPARISON:  Chest radiograph performed 11/11/2014  FINDINGS: The patient's endotracheal tube is seen ending 3-4 cm above the carina. The enteric tube is noted extending below the diaphragm.  Vascular congestion is noted, with mildly increased interstitial markings, raising question for mild interstitial edema. A small left pleural effusion is suspected. No pneumothorax is seen, though the lung apices are incompletely imaged on this study.  The cardiomediastinal silhouette is mildly enlarged. A pacemaker/AICD is noted overlying the left chest wall, with leads ending overlying the right  atrium, right ventricle and coronary sinus. No acute osseous abnormalities are identified. External pacing pads are seen.  IMPRESSION: 1. Endotracheal tube seen ending 3-4 cm above the carina. 2. Vascular congestion and mild cardiomegaly, with mildly increased interstitial markings, raising question for mild interstitial edema. Suggestion of small left pleural effusion.   Electronically Signed   By: Roanna Raider M.D.   On: 2015-01-31 23:54     ASSESSMENT: PEA arrest on hypothermia Nonischemic CM Cocaine positive Acute resp failure   PLAN/DISCUSSION: 1. Supportive care currently  2. Echo in am  3. AICD interrogation medtronic for underlying rhythm 4. Management per critical care Cardiology will follow along with you  Sulaiman Alonna Minium MD

## 2015-01-28 NOTE — ED Provider Notes (Signed)
CSN: [id number]     Arrival date & time 01/25/2015  2252 History   First MD Initiated Contact with Patient 02/15/2015 2303     Chief Complaint  Patient presents with  . Cardiac Arrest     (Consider location/radiation/quality/duration/timing/severity/associated sxs/prior Treatment) The history is provided by the EMS personnel and medical records. The history is limited by the condition of the patient.  62 y AAM PMH CHF, DM, CAD, pacemaker p/w cardiac arrest. Patient reportedly called 911 stating he couldn't breathe. When EMS arrived pt notably PEA with rate of 58. CPR started and underwent 4 rounds of epi, 1 amp D50, 2 mg narcan, got return of pulse. Pt had wide QRS noted following this and then given 300 amio bolus for subsequent ventricular tachycardia and amio drip started. King airway placed and patient ventilating well. BP hypotensive at times but improved with epi drip. 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   Past Medical History  Diagnosis Date  . CHF (congestive heart failure)     EF 30-35%, nonischemic  . CAD (coronary artery disease)   . Hyperlipidemia     hypertriglyceridemia on niaspan and lipitor.  . Asthma   . Allergy   . PVD (peripheral vascular disease)     s/p left AKA @ [facility] (MRSA septic arthritis)  . Diabetes mellitus     dx in 2009, DKA, came unresponsive  . Tobacco abuse   . Hepatitis C   . Hypertension   . LBBB (left bundle branch block)   . Anemia of chronic disease     baseline Hgb now 13  . Thrombocytopenia     07/2007, went down to 90,000. likely 2/2 neurontin.  . Cocaine use     s/p cardiopulmonary arrest 12/25/2002 ([facility])  . Cholelithiasis     documented on CT abdomen in 07/2007  . Non-ischemic cardiomyopathy 04/2014    BiV-ICD placed   Past Surgical History  Procedure Laterality Date  . Left leg mrsa infection s/p aka    . Left shoulder cyst removal    . Biv-icd (crt-d)  05/19/14    Medtronic Quattro  . Left heart catheterization with  coronary angiogram N/A 05/04/2014    Procedure: LEFT HEART CATHETERIZATION WITH CORONARY ANGIOGRAM;  Surgeon: Micheline Chapman, MD;  Location: [facility] CATH LAB;  Service: Cardiovascular;  Laterality: N/A;  . Bi-ventricular implantable cardioverter defibrillator N/A 05/19/2014    Procedure: BI-VENTRICULAR IMPLANTABLE CARDIOVERTER DEFIBRILLATOR  (CRT-D);  Surgeon: Duke Salvia, MD;  Location: [facility] CATH LAB;  Service: Cardiovascular;  Laterality: N/A;   Family History  Problem Relation Age of Onset  . Coronary artery disease Mother     died in her 78's  . Cirrhosis Father     died from complications  . Coronary artery disease Brother     died of fatal MI   History  Substance Use Topics  . Smoking status: Current Some Day Smoker -- 3 years  . Smokeless tobacco: Not on file  . Alcohol Use: No    Review of Systems    Allergies  Review of patient's allergies indicates no known allergies.  Home Medications   Prior to Admission medications   Medication Sig Start Date End Date Taking? Authorizing Provider  amLODipine (NORVASC) 10 MG tablet Take 10 mg by mouth daily. 12/28/14  Yes Historical Provider, MD  aspirin EC 81 MG tablet Take 1 tablet (81 mg total) by mouth daily. 05/21/14  Yes Nishant Dhungel, MD  atorvastatin (LIPITOR)  10 MG tablet Take 1 tablet (10 mg total) by mouth daily. 10/03/11  Yes Lars Mage, MD  carvedilol (COREG) 12.5 MG tablet Take 2 tablets (25 mg total) by mouth 2 (two) times daily with a meal. 05/21/14  Yes Nishant Dhungel, MD  cetirizine (ZYRTEC) 10 MG tablet Take 1 tablet (10 mg total) by mouth daily. 10/03/11  Yes Lars Mage, MD  enalapril (VASOTEC) 2.5 MG tablet Take 1 tablet (2.5 mg total) by mouth daily. 05/21/14  Yes Nishant Dhungel, MD  furosemide (LASIX) 80 MG tablet Take 0.5 tablets (40 mg total) by mouth 2 (two) times daily. 05/21/14  Yes Nishant Dhungel, MD  gabapentin (NEURONTIN) 300 MG capsule Take 300 mg by mouth 3 (three) times daily.   Yes Historical  Provider, MD  glipiZIDE (GLUCOTROL) 10 MG tablet Take 1 tablet by mouth daily before breakfast.  08/11/13  Yes Historical Provider, MD  isosorbide-hydrALAZINE (BIDIL) 20-37.5 MG per tablet Take 2 tablets by mouth 2 (two) times daily. 05/21/14  Yes Nishant Dhungel, MD  metFORMIN (GLUCOPHAGE) 1000 MG tablet Take 1 tablet (1,000 mg total) by mouth 2 (two) times daily with a meal. 05/14/11  Yes Lars Mage, MD  amitriptyline (ELAVIL) 100 MG tablet Take 100 mg by mouth at bedtime.    Historical Provider, MD  niacin (NIASPAN) 500 MG CR tablet Take 500 mg by mouth at bedtime.  08/11/13   Historical Provider, MD  oxyCODONE-acetaminophen (PERCOCET) 10-325 MG per tablet Take 1 tablet by mouth every 12 (twelve) hours as needed for pain. 11/12/14   Shanker Levora Dredge, MD  venlafaxine (EFFEXOR) 50 MG tablet Take 1 tablet (50 mg total) by mouth at bedtime. Patient not taking: Reported on 01/28/2015 10/03/11   Lars Mage, MD  zolpidem (AMBIEN) 10 MG tablet Take 10 mg by mouth at bedtime as needed for sleep.    Historical Provider, MD   BP 91/49 mmHg  Pulse 61  Temp(Src) 96.8 F (36 C) (Core (Comment))  Resp 18  Ht 6' (1.829 m)  Wt 197 lb 5 oz (89.5 kg)  BMI 26.75 kg/m2  SpO2 99% Physical Exam  ED Course  INTUBATION Date/Time: 02/14/2015 10:58 PM Performed by: Madolyn Frieze Authorized by: Pricilla Loveless Consent: The procedure was performed in an emergent situation. Intubation method: video-assisted Patient status: paralyzed (RSI) Preoxygenation: BVM Sedatives: etomidate Paralytic: rocuronium Laryngoscope size: Mac 3 Tube size: 8.0 mm Tube type: cuffed Number of attempts: 2 (Initially attempted direct intubation and then video) Ventilation between attempts: BVM Cords visualized: yes Post-procedure assessment: chest rise and CO2 detector Breath sounds: equal Cuff inflated: yes Tube secured with: ETT holder Chest x-ray findings: endotracheal tube in appropriate position Patient tolerance: Patient  tolerated the procedure well with no immediate complications   (including critical care time) Labs Review Labs Reviewed  MRSA PCR SCREENING - Abnormal; Notable for the following:    MRSA by PCR POSITIVE (*)    All other components within normal limits  COMPREHENSIVE METABOLIC PANEL - Abnormal; Notable for the following:    Potassium 3.1 (*)    CO2 20 (*)    Glucose, Bld 226 (*)    Creatinine, Ser 2.55 (*)    Calcium 7.9 (*)    Total Protein 6.4 (*)    Albumin 3.0 (*)    ALT 15 (*)    GFR calc non Af Amer 24 (*)    GFR calc Af Amer 28 (*)    All other components within normal limits  TROPONIN I - Abnormal; Notable  for the following:    Troponin I 0.10 (*)    All other components within normal limits  CBC WITH DIFFERENTIAL/PLATELET - Abnormal; Notable for the following:    WBC 21.6 (*)    Hemoglobin 12.8 (*)    Neutro Abs 13.8 (*)    Lymphs Abs 5.4 (*)    Monocytes Absolute 1.6 (*)    All other components within normal limits  PROTIME-INR - Abnormal; Notable for the following:    Prothrombin Time 16.3 (*)    All other components within normal limits  URINALYSIS, ROUTINE W REFLEX MICROSCOPIC (NOT AT [facility]) - Abnormal; Notable for the following:    APPearance CLOUDY (*)    Glucose, UA 100 (*)    Hgb urine dipstick MODERATE (*)    Protein, ur >300 (*)    All other components within normal limits  BRAIN NATRIURETIC PEPTIDE - Abnormal; Notable for the following:    B Natriuretic Peptide 2544.6 (*)    All other components within normal limits  APTT - Abnormal; Notable for the following:    aPTT 55 (*)    All other components within normal limits  CBC - Abnormal; Notable for the following:    WBC 16.7 (*)    Hemoglobin 12.5 (*)    HCT 38.1 (*)    All other components within normal limits  COMPREHENSIVE METABOLIC PANEL - Abnormal; Notable for the following:    Potassium 3.2 (*)    CO2 18 (*)    Glucose, Bld 205 (*)    Creatinine, Ser 2.16 (*)    Calcium 7.7 (*)    Total  Protein 5.9 (*)    Albumin 2.8 (*)    Total Bilirubin 1.8 (*)    GFR calc non Af Amer 29 (*)    GFR calc Af Amer 34 (*)    All other components within normal limits  PHOSPHORUS - Abnormal; Notable for the following:    Phosphorus 1.1 (*)    All other components within normal limits  URINE MICROSCOPIC-ADD ON - Abnormal; Notable for the following:    Bacteria, UA MANY (*)    All other components within normal limits  URINE RAPID DRUG SCREEN, HOSP PERFORMED - Abnormal; Notable for the following:    Cocaine POSITIVE (*)    All other components within normal limits  HEPARIN LEVEL (UNFRACTIONATED) - Abnormal; Notable for the following:    Heparin Unfractionated <0.10 (*)    All other components within normal limits  GLUCOSE, CAPILLARY - Abnormal; Notable for the following:    Glucose-Capillary 240 (*)    All other components within normal limits  GLUCOSE, CAPILLARY - Abnormal; Notable for the following:    Glucose-Capillary 211 (*)    All other components within normal limits  GLUCOSE, CAPILLARY - Abnormal; Notable for the following:    Glucose-Capillary 213 (*)    All other components within normal limits  GLUCOSE, CAPILLARY - Abnormal; Notable for the following:    Glucose-Capillary 183 (*)    All other components within normal limits  GLUCOSE, CAPILLARY - Abnormal; Notable for the following:    Glucose-Capillary 134 (*)    All other components within normal limits  PROTIME-INR - Abnormal; Notable for the following:    Prothrombin Time 17.3 (*)    All other components within normal limits  GLUCOSE, CAPILLARY - Abnormal; Notable for the following:    Glucose-Capillary 117 (*)    All other components within normal limits  BASIC METABOLIC PANEL - Abnormal;  Notable for the following:    Potassium 3.2 (*)    CO2 21 (*)    Creatinine, Ser 2.46 (*)    Calcium 8.3 (*)    GFR calc non Af Amer 25 (*)    GFR calc Af Amer 29 (*)    All other components within normal limits  BASIC  METABOLIC PANEL - Abnormal; Notable for the following:    Potassium 3.2 (*)    CO2 21 (*)    Glucose, Bld 170 (*)    Creatinine, Ser 2.61 (*)    Calcium 8.2 (*)    GFR calc non Af Amer 23 (*)    GFR calc Af Amer 27 (*)    All other components within normal limits  BASIC METABOLIC PANEL - Abnormal; Notable for the following:    Potassium 3.4 (*)    CO2 21 (*)    Creatinine, Ser 2.75 (*)    Calcium 8.1 (*)    GFR calc non Af Amer 22 (*)    GFR calc Af Amer 25 (*)    All other components within normal limits  CBC - Abnormal; Notable for the following:    WBC 14.6 (*)    Hemoglobin 11.9 (*)    HCT 35.2 (*)    All other components within normal limits  BASIC METABOLIC PANEL - Abnormal; Notable for the following:    CO2 21 (*)    Glucose, Bld 100 (*)    Creatinine, Ser 2.83 (*)    Calcium 7.9 (*)    GFR calc non Af Amer 21 (*)    GFR calc Af Amer 24 (*)    All other components within normal limits  HEPARIN LEVEL (UNFRACTIONATED) - Abnormal; Notable for the following:    Heparin Unfractionated 0.28 (*)    All other components within normal limits  GLUCOSE, CAPILLARY - Abnormal; Notable for the following:    Glucose-Capillary 163 (*)    All other components within normal limits  I-STAT CHEM 8, ED - Abnormal; Notable for the following:    Potassium 3.2 (*)    Creatinine, Ser 2.20 (*)    Glucose, Bld 232 (*)    Calcium, Ion 1.08 (*)    All other components within normal limits  I-STAT CG4 LACTIC ACID, ED - Abnormal; Notable for the following:    Lactic Acid, Venous 5.77 (*)    All other components within normal limits  I-STAT TROPOININ, ED - Abnormal; Notable for the following:    Troponin i, poc 0.11 (*)    All other components within normal limits  I-STAT ARTERIAL BLOOD GAS, ED - Abnormal; Notable for the following:    pH, Arterial 7.248 (*)    pCO2 arterial 53.1 (*)    Acid-base deficit 5.0 (*)    All other components within normal limits  CBG MONITORING, ED - Abnormal;  Notable for the following:    Glucose-Capillary 201 (*)    All other components within normal limits  I-STAT ARTERIAL BLOOD GAS, ED - Abnormal; Notable for the following:    pCO2 arterial 46.4 (*)    pO2, Arterial 120.0 (*)    Bicarbonate 25.8 (*)    All other components within normal limits  POCT I-STAT, CHEM 8 - Abnormal; Notable for the following:    Potassium 3.1 (*)    Creatinine, Ser 2.50 (*)    Glucose, Bld 245 (*)    Calcium, Ion 1.05 (*)    All other components within normal limits  POCT I-STAT, CHEM 8 - Abnormal; Notable for the following:    Potassium 3.1 (*)    Creatinine, Ser 2.30 (*)    Glucose, Bld 224 (*)    Calcium, Ion 1.10 (*)    All other components within normal limits  POCT I-STAT, CHEM 8 - Abnormal; Notable for the following:    Potassium 2.8 (*)    Creatinine, Ser 2.20 (*)    Glucose, Bld 204 (*)    Calcium, Ion 1.08 (*)    All other components within normal limits  POCT I-STAT, CHEM 8 - Abnormal; Notable for the following:    Potassium 2.9 (*)    Chloride 115 (*)    Creatinine, Ser 2.30 (*)    Glucose, Bld 154 (*)    Calcium, Ion 1.05 (*)    All other components within normal limits  POCT I-STAT, CHEM 8 - Abnormal; Notable for the following:    Creatinine, Ser 2.40 (*)    All other components within normal limits  CULTURE, BLOOD (ROUTINE X 2)  CULTURE, BLOOD (ROUTINE X 2)  URINE CULTURE  CULTURE, RESPIRATORY (NON-EXPECTORATED)  ETHANOL  APTT  PROCALCITONIN  MAGNESIUM  PHOSPHORUS  MAGNESIUM  GLUCOSE, CAPILLARY  GLUCOSE, CAPILLARY  HEPARIN LEVEL (UNFRACTIONATED)  PROCALCITONIN  GLUCOSE, CAPILLARY  GLUCOSE, CAPILLARY  GLUCOSE, CAPILLARY  GLUCOSE, CAPILLARY  GLUCOSE, CAPILLARY  GLUCOSE, CAPILLARY  LACTIC ACID, PLASMA  LACTIC ACID, PLASMA  GLUCOSE, CAPILLARY  HEPARIN LEVEL (UNFRACTIONATED)  I-STAT CHEM 8, ED  I-STAT CG4 LACTIC ACID, ED  TYPE AND SCREEN    Imaging Review Ct Head Wo Contrast  01/28/2015   CLINICAL DATA:  Unresponsive   EXAM: CT HEAD WITHOUT CONTRAST  TECHNIQUE: Contiguous axial images were obtained from the base of the skull through the vertex without intravenous contrast.  COMPARISON:  05/09/2014  FINDINGS: There is no intracranial hemorrhage, mass or evidence of acute infarction. There is mild generalized atrophy. There is moderate chronic microvascular ischemic change. There is no significant extra-axial fluid collection.  No acute intracranial findings are evident. No bony abnormality is evident.  IMPRESSION: Mild atrophy and moderate generalized white matter hypodensity consistent with small vessel disease. No acute intracranial findings.   Electronically Signed   By: Ellery Plunk M.D.   On: 01/28/2015 03:00   Dg Chest Port 1 View  01/28/2015   CLINICAL DATA:  Central line placement  EXAM: PORTABLE CHEST - 1 VIEW  COMPARISON:  02/10/2015  FINDINGS: Endotracheal tube is 1-2 cm above the carina. Nasogastric tube extends into the stomach. There is a new left jugular central line with tip in the low SVC. There is no pneumothorax. Central and basilar airspace opacities persist, and there is a moderate right pleural effusion which is probably larger.  IMPRESSION: New left jugular central line. No pneumothorax. Enlarging right pleural effusion. Unchanged central low aedema or infiltrate.   Electronically Signed   By: Ellery Plunk M.D.   On: 01/28/2015 03:02   Dg Chest Port 1 View  02/19/2015   CLINICAL DATA:  Status post cardiac arrest.  Initial encounter.  EXAM: PORTABLE CHEST - 1 VIEW  COMPARISON:  Chest radiograph performed 11/11/2014  FINDINGS: The patient's endotracheal tube is seen ending 3-4 cm above the carina. The enteric tube is noted extending below the diaphragm.  Vascular congestion is noted, with mildly increased interstitial markings, raising question for mild interstitial edema. A small left pleural effusion is suspected. No pneumothorax is seen, though the lung apices are incompletely imaged on this  study.  The cardiomediastinal silhouette is mildly enlarged. A pacemaker/AICD is noted overlying the left chest wall, with leads ending overlying the right atrium, right ventricle and coronary sinus. No acute osseous abnormalities are identified. External pacing pads are seen.  IMPRESSION: 1. Endotracheal tube seen ending 3-4 cm above the carina. 2. Vascular congestion and mild cardiomegaly, with mildly increased interstitial markings, raising question for mild interstitial edema. Suggestion of small left pleural effusion.   Electronically Signed   By: Roanna Raider M.D.   On: 02/15/2015 23:54     EKG Interpretation   Date/Time:  Saturday January 27 2015 22:58:00 EDT Ventricular Rate:  57 PR Interval:  161 QRS Duration: 158 QT Interval:  534 QTC Calculation: 520 R Axis:   -81 Text Interpretation:  Sinus rhythm IVCD, consider atypical RBBB LVH with  secondary repolarization abnormality ED PHYSICIAN INTERPRETATION AVAILABLE  IN [facility] Confirmed by TEST, Record ([id number]) on 01/28/2015 8:20:18  AM      MDM   Final diagnoses:  Coma  Encounter for central line placement  History of ETT    65 y AAM PMH CHF, DM, CAD, pacemaker p/w cardiac arrest. Patient reportedly called 911 stating he couldn't breathe.  On arrival patient had sinus rhythm with palpable pulses. EKG was obtained as noted above. No signs of post resuscitation STEMI. Patient was taken off of the epi drip and placed on a norepinephrine drip for blood pressure control. Additionally patient was intubated by myself as noted above. No clear etiology of patient's symptoms although CHF exacerbation is very likely given prior history. At this point cannot fully rule out cardiac etiology as well. Discussed patient with critical care medicine who will note the patient for further evaluation and management. Patient was subsequently transferred to the unit for further care.    Madolyn Frieze, MD 01/29/15 1539  Pricilla Loveless,  MD 02/06/15 [phone]

## 2015-01-28 NOTE — Progress Notes (Signed)
eLink Physician-Brief Progress Note Patient Name: Jonathon Galvan DOB: 24-Nov-1945 MRN: [id number]   Date of Service  01/28/2015  HPI/Events of Note  Asked to review CXR for L IJ central venous catheter placement. L IJ central venous catheter tip in distal SVC. No pneumothorax appreciated.   eICU Interventions  OK to use L IJ central venous catheter.      Intervention Category Intermediate Interventions: Diagnostic test evaluation  Sommer,Steven Dennard Nip 01/28/2015, 3:46 AM

## 2015-01-29 ENCOUNTER — Inpatient Hospital Stay (HOSPITAL_COMMUNITY): Payer: Medicare Other

## 2015-01-29 DIAGNOSIS — I469 Cardiac arrest, cause unspecified: Secondary | ICD-10-CM

## 2015-01-29 DIAGNOSIS — R570 Cardiogenic shock: Secondary | ICD-10-CM

## 2015-01-29 LAB — HEPARIN LEVEL (UNFRACTIONATED)
Heparin Unfractionated: 0.28 IU/mL — ABNORMAL LOW (ref 0.30–0.70)
Heparin Unfractionated: 0.17 IU/mL — ABNORMAL LOW (ref 0.30–0.70)

## 2015-01-29 LAB — BASIC METABOLIC PANEL
ANION GAP: 9 (ref 5–15)
Anion gap: 11 (ref 5–15)
Anion gap: 11 (ref 5–15)
BUN: 17 mg/dL (ref 6–20)
BUN: 19 mg/dL (ref 6–20)
BUN: 19 mg/dL (ref 6–20)
CALCIUM: 8.1 mg/dL — AB (ref 8.9–10.3)
CHLORIDE: 111 mmol/L (ref 101–111)
CHLORIDE: 110 mmol/L (ref 101–111)
CO2: 21 mmol/L — ABNORMAL LOW (ref 22–32)
CO2: 21 mmol/L — AB (ref 22–32)
CO2: 21 mmol/L — AB (ref 22–32)
Calcium: 8.2 mg/dL — ABNORMAL LOW (ref 8.9–10.3)
Calcium: 7.9 mg/dL — ABNORMAL LOW (ref 8.9–10.3)
Chloride: 110 mmol/L (ref 101–111)
Creatinine, Ser: 2.61 mg/dL — ABNORMAL HIGH (ref 0.61–1.24)
Creatinine, Ser: 2.75 mg/dL — ABNORMAL HIGH (ref 0.61–1.24)
Creatinine, Ser: 2.83 mg/dL — ABNORMAL HIGH (ref 0.61–1.24)
GFR calc Af Amer: 27 mL/min — ABNORMAL LOW (ref >60)
GFR calc non Af Amer: 23 mL/min — ABNORMAL LOW (ref >60)
GFR calc non Af Amer: 22 mL/min — ABNORMAL LOW (ref >60)
GFR calc non Af Amer: 21 mL/min — ABNORMAL LOW (ref >60)
GFR, EST AFRICAN AMERICAN: 25 mL/min — AB (ref >60)
GFR, EST AFRICAN AMERICAN: 24 mL/min — AB (ref >60)
GLUCOSE: 100 mg/dL — AB (ref 65–99)
Glucose, Bld: 170 mg/dL — ABNORMAL HIGH (ref 65–99)
Glucose, Bld: 98 mg/dL (ref 65–99)
POTASSIUM: 3.2 mmol/L — AB (ref 3.5–5.1)
POTASSIUM: 3.4 mmol/L — AB (ref 3.5–5.1)
Potassium: 3.8 mmol/L (ref 3.5–5.1)
Sodium: 140 mmol/L (ref 135–145)
Sodium: 143 mmol/L (ref 135–145)
Sodium: 142 mmol/L (ref 135–145)

## 2015-01-29 LAB — GLUCOSE, CAPILLARY
GLUCOSE-CAPILLARY: 163 mg/dL — AB (ref 65–99)
GLUCOSE-CAPILLARY: 71 mg/dL (ref 65–99)
GLUCOSE-CAPILLARY: 86 mg/dL (ref 65–99)
Glucose-Capillary: 75 mg/dL (ref 65–99)
Glucose-Capillary: 70 mg/dL (ref 65–99)
Glucose-Capillary: 93 mg/dL (ref 65–99)
Glucose-Capillary: 95 mg/dL (ref 65–99)
Glucose-Capillary: 88 mg/dL (ref 65–99)

## 2015-01-29 LAB — CBC
HEMATOCRIT: 35.2 % — AB (ref 39.0–52.0)
Hemoglobin: 11.9 g/dL — ABNORMAL LOW (ref 13.0–17.0)
MCH: 26.5 pg (ref 26.0–34.0)
MCHC: 33.8 g/dL (ref 30.0–36.0)
MCV: 78.4 fL (ref 78.0–100.0)
PLATELETS: 172 10*3/uL (ref 150–400)
RBC: 4.49 MIL/uL (ref 4.22–5.81)
RDW: 14.7 % (ref 11.5–15.5)
WBC: 14.6 10*3/uL — AB (ref 4.0–10.5)

## 2015-01-29 LAB — LACTIC ACID, PLASMA
Lactic Acid, Venous: 1.4 mmol/L (ref 0.5–2.0)
Lactic Acid, Venous: 1.3 mmol/L (ref 0.5–2.0)

## 2015-01-29 LAB — URINE CULTURE: CULTURE: NO GROWTH

## 2015-01-29 LAB — PROCALCITONIN: Procalcitonin: 9 ng/mL

## 2015-01-29 MED ORDER — DEXTROSE 50 % IV SOLN
1.0000 | Freq: Once | INTRAVENOUS | Status: AC
  Administered 2015-01-29: 50 mL via INTRAVENOUS
  Filled 2015-01-29 (×3): qty 50

## 2015-01-29 MED ORDER — SODIUM CHLORIDE 0.9 % IV SOLN
25.0000 ug/h | INTRAVENOUS | Status: DC
  Administered 2015-01-30: 250 ug/h via INTRAVENOUS
  Filled 2015-01-29: qty 50

## 2015-01-29 MED ORDER — MIDAZOLAM HCL 2 MG/2ML IJ SOLN: 1.0000 mg | INTRAMUSCULAR | Status: DC | PRN

## 2015-01-29 MED ORDER — FENTANYL BOLUS VIA INFUSION
25.0000 ug | INTRAVENOUS | Status: DC | PRN
  Filled 2015-01-29: qty 25

## 2015-01-29 MED ORDER — BISACODYL 10 MG RE SUPP: 10.0000 mg | Freq: Every day | RECTAL | Status: DC | PRN

## 2015-01-29 MED ORDER — DOCUSATE SODIUM 50 MG/5ML PO LIQD: 100.0000 mg | Freq: Two times a day (BID) | ORAL | Status: DC | PRN

## 2015-01-29 MED ORDER — SODIUM CHLORIDE 0.9 % IV SOLN
3.0000 g | Freq: Two times a day (BID) | INTRAVENOUS | Status: DC
  Administered 2015-01-29 – 2015-02-02 (×8): 3 g via INTRAVENOUS
  Filled 2015-01-29 (×9): qty 3

## 2015-01-29 MED ORDER — HEPARIN (PORCINE) IN NACL 100-0.45 UNIT/ML-% IJ SOLN
1550.0000 [IU]/h | INTRAMUSCULAR | Status: DC
  Administered 2015-01-30 (×2): 1400 [IU]/h via INTRAVENOUS
  Filled 2015-01-29 (×4): qty 250

## 2015-01-29 MED ORDER — FENTANYL CITRATE (PF) 100 MCG/2ML IJ SOLN
50.0000 ug | Freq: Once | INTRAMUSCULAR | Status: AC
  Administered 2015-01-29: 50 ug via INTRAVENOUS

## 2015-01-29 NOTE — Progress Notes (Signed)
ANTICOAGULATION CONSULT NOTE - Follow Up Consult  Pharmacy Consult for heparin Indication: atrial fibrillation  No Known Allergies  Patient Measurements: Height: 6' (182.9 cm) Weight: 197 lb 5 oz (89.5 kg) (with pads on ) IBW/kg (Calculated) : 77.6 Heparin Dosing Weight: 77.6  Vital Signs: Temp: 94.5 F (34.7 C) (08/08 1000) Temp Source: Core (Comment) (08/08 1000) BP: 109/59 mmHg (08/08 1000) Pulse Rate: 52 (08/08 1000)  Labs:  Recent Labs  02/18/2015 2258  01/28/15 0433  01/28/15 0532  01/28/15 0830 01/28/15 0846 01/28/15 0911 01/28/15 1200 01/28/15 1554  01/28/15 2230 01/29/15 0030 01/29/15 0455 01/29/15 0822 01/29/15 1021  HGB 12.8*  < >  --   < > 12.5*  < >  --   --  13.6  --  13.9  --   --   --  11.9*  --   --   HCT 39.9  < >  --   < > 38.1*  < >  --   --  40.0  --  41.0  --   --   --  35.2*  --   --   PLT 210  --   --   --  182  --   --   --   --   --   --   --   --   --  172  --   --   APTT  --   --  31  --   --   --  55*  --   --   --   --   --   --   --   --   --   --   LABPROT 16.3*  --   --   --   --   --   --  17.3*  --   --   --   --   --   --   --   --   --   INR 1.29  --   --   --   --   --   --  1.41  --   --   --   --   --   --   --   --   --   HEPARINUNFRC  --   --   --   --   --   --   --   --   --  <0.10*  --   --  0.30  --   --   --  0.28*  CREATININE 2.55*  < >  --   < > 2.16*  < >  --   --  2.30*  --  2.40*  < >  --  2.61* 2.75* 2.83*  --   TROPONINI 0.10*  --   --   --   --   --   --   --   --   --   --   --   --   --   --   --   --   < > = values in this interval not displayed.  Estimated Creatinine Clearance: 26.7 mL/min (by C-G formula based on Cr of 2.83).   Medications:  Prescriptions prior to admission  Medication Sig Dispense Refill Last Dose  . amLODipine (NORVASC) 10 MG tablet Take 10 mg by mouth daily.   02/16/2015 at Unknown time  . aspirin EC 81 MG tablet Take 1 tablet (81 mg total) by mouth daily. 30 tablet  0 01-30-2015 at Unknown  time  . atorvastatin (LIPITOR) 10 MG tablet Take 1 tablet (10 mg total) by mouth daily. 30 tablet 5 2015/01/30 at Unknown time  . carvedilol (COREG) 12.5 MG tablet Take 2 tablets (25 mg total) by mouth 2 (two) times daily with a meal. 30 tablet 0 Jan 30, 2015 at 0930  . cetirizine (ZYRTEC) 10 MG tablet Take 1 tablet (10 mg total) by mouth daily. 30 tablet 5 01/30/2015 at Unknown time  . enalapril (VASOTEC) 2.5 MG tablet Take 1 tablet (2.5 mg total) by mouth daily. 30 tablet 0 01/30/15 at Unknown time  . furosemide (LASIX) 80 MG tablet Take 0.5 tablets (40 mg total) by mouth 2 (two) times daily. 60 tablet 11 01-30-2015 at Unknown time  . gabapentin (NEURONTIN) 300 MG capsule Take 300 mg by mouth 3 (three) times daily.   January 30, 2015 at Unknown time  . glipiZIDE (GLUCOTROL) 10 MG tablet Take 1 tablet by mouth daily before breakfast.    01-30-15 at Unknown time  . isosorbide-hydrALAZINE (BIDIL) 20-37.5 MG per tablet Take 2 tablets by mouth 2 (two) times daily. 120 tablet 0 01-30-2015 at Unknown time  . metFORMIN (GLUCOPHAGE) 1000 MG tablet Take 1 tablet (1,000 mg total) by mouth 2 (two) times daily with a meal. 60 tablet 11 30-Jan-2015 at Unknown time  . amitriptyline (ELAVIL) 100 MG tablet Take 100 mg by mouth at bedtime.   01/26/2015  . niacin (NIASPAN) 500 MG CR tablet Take 500 mg by mouth at bedtime.    01/26/2015  . oxyCODONE-acetaminophen (PERCOCET) 10-325 MG per tablet Take 1 tablet by mouth every 12 (twelve) hours as needed for pain. 30 tablet 0   . venlafaxine (EFFEXOR) 50 MG tablet Take 1 tablet (50 mg total) by mouth at bedtime. (Patient not taking: Reported on 01/28/2015) 30 tablet 5 Not Taking at Unknown time  . zolpidem (AMBIEN) 10 MG tablet Take 10 mg by mouth at bedtime as needed for sleep.   unk    Assessment: 69 yo man admitted admitted 2015-01-30 s/p PEA arrest with ROSC. On heparin for AF. Artic sun hypothermia protocol started, rewarming started at 0430 on 08/08.   Anticoagulation: hep gtt s/p cardiac  arrest, AF HL 0.28 subtherapeutic, Hgb 11.9, plt wnl On heparin 1100 units/hr   Goal of Therapy:  Heparin level 0.3-0.7 units/ml Monitor platelets by anticoagulation protocol: Yes   Plan:  Increase heparin to 1200 units/h HL in 6h Daily HL & CBC Monitor s/sx bleeding   Hillery Aldo, Pharm.D. PGY2 Cardiology Pharmacy Resident Pager: [phone] 01/29/2015,11:21 AM

## 2015-01-29 NOTE — Procedures (Addendum)
EEG report.  Brief clinical history: 69 yo M with known history of HF, CAD and an ICD, found by EMS on 8/6 in PEA after he called them concerned with new dyspnea. They had ROSC after 12 minutes of CPR. Intubated in ED and admitted to PCCM. Patient underwent hypothermia protocol and is currently rewarming at 35.6 degrees.  Technique: this is a 17 channel routine scalp EEG performed at the bedside in the ICU, with bipolar and monopolar montages arranged in accordance to the international 10/20 system of electrode placement. One channel was dedicated to EKG recording.  The patient is sedated with fentanyl, versed, and nimbex, intubated on the vent, and rewarming at 35.6 degrees. No activating procedures performed.  Description: as the study begins and throughout the entire recording, there is lack of electrocortical activity. Nonetheless, it must be stressed that the patient is currently rewarming at 35.6 degrees.  EKG showed sinus rhythm.  Impression: this is an abnormal EEG that due to the lack of electro-cerebral activity. Nonetheless, it must be stressed that the patient is currently rewarming at 35.6 degrees and thus a repeat study after accomplishing normothermia is recommended.Clinical correlation is advised.   Wyatt Portela, MD [facility]

## 2015-01-29 NOTE — Progress Notes (Signed)
Patient Name: Jonathon Galvan Date of Encounter: 01/29/2015     Active Problems:   Cardiac arrest    SUBJECTIVE  Sedated on vent. Rewarming in progress.  Rhythm is sinus bradycardia 55/min  CURRENT MEDS . sodium chloride  2,000 mL Intravenous Once  . amitriptyline  100 mg Per Tube QHS  . ampicillin-sulbactam (UNASYN) IV  3 g Intravenous Q12H  . antiseptic oral rinse  7 mL Mouth Rinse Q2H  . artificial tears  1 application Both Eyes 3 times per day  . aspirin  81 mg Per Tube Daily  . atorvastatin  10 mg Per Tube Daily  . chlorhexidine gluconate  15 mL Mouth Rinse BID  . Chlorhexidine Gluconate Cloth  6 each Topical Q0600  . cisatracurium  0.1 mg/kg Intravenous Once  . fentaNYL (SUBLIMAZE) injection  50 mcg Intravenous Once  . insulin aspart  0-15 Units Subcutaneous 6 times per day  . mupirocin ointment  1 application Nasal BID  . pantoprazole sodium  40 mg Per Tube Q1200    OBJECTIVE  Filed Vitals:   01/29/15 0741 01/29/15 0800 01/29/15 0900 01/29/15 1000  BP:  146/78  109/59  Pulse: 50 50 51 52  Temp:  93.2 F (34 C) 94.1 F (34.5 C) 94.5 F (34.7 C)  TempSrc:  Core (Comment)  Core (Comment)  Resp: 18 18 18 18   Height:      Weight:      SpO2: 100% 99% 99% 100%    Intake/Output Summary (Last 24 hours) at 01/29/15 1039 Last data filed at 01/29/15 1000  Gross per 24 hour  Intake 1860.14 ml  Output    625 ml  Net 1235.14 ml   Filed Weights   02/17/15 2300 01/28/15 0500 01/29/15 0439  Weight: 183 lb 13.8 oz (83.399 kg) 191 lb 12.8 oz (87 kg) 197 lb 5 oz (89.5 kg)    PHYSICAL EXAM  General:Sedated on vent    Neck: Supple without bruits or JVD. Lungs:  Resp regular and unlabored, CTA.Clear anteriorly  Heart: RRR no s3, s4, or murmurs. Abdomen: Soft, non-tender, non-distended, BS + x 4.    Accessory Clinical Findings  CBC  Recent Labs  17-Feb-2015 2258  01/28/15 0532  01/28/15 1554 01/29/15 0455  WBC 21.6*  --  16.7*  --   --  14.6*  NEUTROABS  13.8*  --   --   --   --   --   HGB 12.8*  < > 12.5*  < > 13.9 11.9*  HCT 39.9  < > 38.1*  < > 41.0 35.2*  MCV 83.0  --  80.2  --   --  78.4  PLT 210  --  182  --   --  172  < > = values in this interval not displayed. Basic Metabolic Panel  Recent Labs  01/28/15 0140  01/28/15 0532  01/29/15 0455 01/29/15 0822  NA  --   < > 139  < > 143 142  K  --   < > 3.2*  < > 3.4* 3.8  CL  --   < > 111  < > 111 110  CO2  --   --  18*  < > 21* 21*  GLUCOSE  --   < > 205*  < > 98 100*  BUN  --   < > 13  < > 19 19  CREATININE  --   < > 2.16*  < > 2.75* 2.83*  CALCIUM  --   --  7.7*  < > 8.1* 7.9*  MG 2.0  --  1.8  --   --   --   PHOS 3.5  --  1.1*  --   --   --   < > = values in this interval not displayed. Liver Function Tests  Recent Labs  31-Jan-2015 2258 01/28/15 0532  AST 34 30  ALT 15* 17  ALKPHOS 57 58  BILITOT 1.1 1.8*  PROT 6.4* 5.9*  ALBUMIN 3.0* 2.8*   No results for input(s): LIPASE, AMYLASE in the last 72 hours. Cardiac Enzymes  Recent Labs  01-31-15 2258  TROPONINI 0.10*   BNP Invalid input(s): POCBNP D-Dimer No results for input(s): DDIMER in the last 72 hours. Hemoglobin A1C No results for input(s): HGBA1C in the last 72 hours. Fasting Lipid Panel No results for input(s): CHOL, HDL, LDLCALC, TRIG, CHOLHDL, LDLDIRECT in the last 72 hours. Thyroid Function Tests No results for input(s): TSH, T4TOTAL, T3FREE, THYROIDAB in the last 72 hours.  Invalid input(s): FREET3  TELE  Sinus bradycardia  ECG    Radiology/Studies  Ct Head Wo Contrast  01/28/2015   CLINICAL DATA:  Unresponsive  EXAM: CT HEAD WITHOUT CONTRAST  TECHNIQUE: Contiguous axial images were obtained from the base of the skull through the vertex without intravenous contrast.  COMPARISON:  05/09/2014  FINDINGS: There is no intracranial hemorrhage, mass or evidence of acute infarction. There is mild generalized atrophy. There is moderate chronic microvascular ischemic change. There is no  significant extra-axial fluid collection.  No acute intracranial findings are evident. No bony abnormality is evident.  IMPRESSION: Mild atrophy and moderate generalized white matter hypodensity consistent with small vessel disease. No acute intracranial findings.   Electronically Signed   By: Ellery Plunk M.D.   On: 01/28/2015 03:00   Dg Chest Port 1 View  01/28/2015   CLINICAL DATA:  Central line placement  EXAM: PORTABLE CHEST - 1 VIEW  COMPARISON:  01/31/15  FINDINGS: Endotracheal tube is 1-2 cm above the carina. Nasogastric tube extends into the stomach. There is a new left jugular central line with tip in the low SVC. There is no pneumothorax. Central and basilar airspace opacities persist, and there is a moderate right pleural effusion which is probably larger.  IMPRESSION: New left jugular central line. No pneumothorax. Enlarging right pleural effusion. Unchanged central low aedema or infiltrate.   Electronically Signed   By: Ellery Plunk M.D.   On: 01/28/2015 03:02   Dg Chest Port 1 View  2015-01-31   CLINICAL DATA:  Status post cardiac arrest.  Initial encounter.  EXAM: PORTABLE CHEST - 1 VIEW  COMPARISON:  Chest radiograph performed 11/11/2014  FINDINGS: The patient's endotracheal tube is seen ending 3-4 cm above the carina. The enteric tube is noted extending below the diaphragm.  Vascular congestion is noted, with mildly increased interstitial markings, raising question for mild interstitial edema. A small left pleural effusion is suspected. No pneumothorax is seen, though the lung apices are incompletely imaged on this study.  The cardiomediastinal silhouette is mildly enlarged. A pacemaker/AICD is noted overlying the left chest wall, with leads ending overlying the right atrium, right ventricle and coronary sinus. No acute osseous abnormalities are identified. External pacing pads are seen.  IMPRESSION: 1. Endotracheal tube seen ending 3-4 cm above the carina. 2. Vascular congestion  and mild cardiomegaly, with mildly increased interstitial markings, raising question for mild interstitial edema. Suggestion of small left pleural effusion.   Electronically Signed   By: Leotis Shames  Chang M.D.   On: 02/21/2015 23:54    ASSESSMENT AND PLAN PEA arrest on hypothermia Nonischemic CM Cocaine positive Acute resp failure    Plan: Continue supportive care.  2-D echo pending   Signed, Ronny Flurry MD

## 2015-01-29 NOTE — Progress Notes (Signed)
eLink Physician-Brief Progress Note Patient Name: Jonathon Galvan DOB: March 05, 1946 MRN: [id number]   Date of Service  01/29/2015  HPI/Events of Note  Sedation converted to PAD protocol post-rewarming   eICU Interventions       Intervention Category Minor Interventions: Other:  Maribel Luis S. 01/29/2015, 8:02 PM

## 2015-01-29 NOTE — Progress Notes (Signed)
ANTICOAGULATION CONSULT NOTE - Follow Up Consult  Pharmacy Consult for heparin Indication: atrial fibrillation  No Known Allergies  Patient Measurements: Height: 6' (182.9 cm) Weight: 197 lb 5 oz (89.5 kg) (with pads on ) IBW/kg (Calculated) : 77.6 Heparin Dosing Weight: 77.6  Vital Signs: Temp: 98.1 F (36.7 C) (08/08 1900) Temp Source: Core (Comment) (08/08 1900) BP: 107/48 mmHg (08/08 1800) Pulse Rate: 65 (08/08 1955)  Labs:  Recent Labs  02/19/15 2258  01/28/15 0433  01/28/15 0532  01/28/15 0830 01/28/15 0846 01/28/15 0911  01/28/15 1554  01/28/15 2230 01/29/15 0030 01/29/15 0455 01/29/15 0822 01/29/15 1021 01/29/15 1906  HGB 12.8*  < >  --   < > 12.5*  < >  --   --  13.6  --  13.9  --   --   --  11.9*  --   --   --   HCT 39.9  < >  --   < > 38.1*  < >  --   --  40.0  --  41.0  --   --   --  35.2*  --   --   --   PLT 210  --   --   --  182  --   --   --   --   --   --   --   --   --  172  --   --   --   APTT  --   --  31  --   --   --  55*  --   --   --   --   --   --   --   --   --   --   --   LABPROT 16.3*  --   --   --   --   --   --  17.3*  --   --   --   --   --   --   --   --   --   --   INR 1.29  --   --   --   --   --   --  1.41  --   --   --   --   --   --   --   --   --   --   HEPARINUNFRC  --   --   --   --   --   --   --   --   --   < >  --   --  0.30  --   --   --  0.28* 0.17*  CREATININE 2.55*  < >  --   < > 2.16*  < >  --   --  2.30*  --  2.40*  < >  --  2.61* 2.75* 2.83*  --   --   TROPONINI 0.10*  --   --   --   --   --   --   --   --   --   --   --   --   --   --   --   --   --   < > = values in this interval not displayed.  Estimated Creatinine Clearance: 26.7 mL/min (by C-G formula based on Cr of 2.83).   Medications:  Prescriptions prior to admission  Medication Sig Dispense Refill Last Dose  . amLODipine (NORVASC) 10 MG tablet Take 10  mg by mouth daily.   01/23/2015 at Unknown time  . aspirin EC 81 MG tablet Take 1 tablet (81 mg total) by  mouth daily. 30 tablet 0 01/26/2015 at Unknown time  . atorvastatin (LIPITOR) 10 MG tablet Take 1 tablet (10 mg total) by mouth daily. 30 tablet 5 02/14/2015 at Unknown time  . carvedilol (COREG) 12.5 MG tablet Take 2 tablets (25 mg total) by mouth 2 (two) times daily with a meal. 30 tablet 0 02/14/2015 at 0930  . cetirizine (ZYRTEC) 10 MG tablet Take 1 tablet (10 mg total) by mouth daily. 30 tablet 5 01/24/2015 at Unknown time  . enalapril (VASOTEC) 2.5 MG tablet Take 1 tablet (2.5 mg total) by mouth daily. 30 tablet 0 02/05/2015 at Unknown time  . furosemide (LASIX) 80 MG tablet Take 0.5 tablets (40 mg total) by mouth 2 (two) times daily. 60 tablet 11 02/06/2015 at Unknown time  . gabapentin (NEURONTIN) 300 MG capsule Take 300 mg by mouth 3 (three) times daily.   02/20/2015 at Unknown time  . glipiZIDE (GLUCOTROL) 10 MG tablet Take 1 tablet by mouth daily before breakfast.    01/23/2015 at Unknown time  . isosorbide-hydrALAZINE (BIDIL) 20-37.5 MG per tablet Take 2 tablets by mouth 2 (two) times daily. 120 tablet 0 01/26/2015 at Unknown time  . metFORMIN (GLUCOPHAGE) 1000 MG tablet Take 1 tablet (1,000 mg total) by mouth 2 (two) times daily with a meal. 60 tablet 11 02/04/2015 at Unknown time  . amitriptyline (ELAVIL) 100 MG tablet Take 100 mg by mouth at bedtime.   01/26/2015  . niacin (NIASPAN) 500 MG CR tablet Take 500 mg by mouth at bedtime.    01/26/2015  . oxyCODONE-acetaminophen (PERCOCET) 10-325 MG per tablet Take 1 tablet by mouth every 12 (twelve) hours as needed for pain. 30 tablet 0   . venlafaxine (EFFEXOR) 50 MG tablet Take 1 tablet (50 mg total) by mouth at bedtime. (Patient not taking: Reported on 01/28/2015) 30 tablet 5 Not Taking at Unknown time  . zolpidem (AMBIEN) 10 MG tablet Take 10 mg by mouth at bedtime as needed for sleep.   unk    Assessment: 69 yo man admitted admitted 02/17/2015 s/p PEA arrest with ROSC. On heparin for AF. Artic sun hypothermia protocol started, rewarming started at 0430 on 08/08.    Anticoagulation: hep gtt s/p cardiac arrest, AF HL 0.28 subtherapeutic, Hgb 11.9, plt wnl  Repeat HL remains subtherapeutic at 0.17 on heparin 1200 units/hr. No issues with infusion or bleeding noted.   Goal of Therapy:  Heparin level 0.3-0.7 units/ml Monitor platelets by anticoagulation protocol: Yes   Plan:  Increase heparin to 1400 units/hr Daily HL & CBC Monitor s/sx bleeding   Arlean Hopping. Newman Pies, PharmD Clinical Pharmacist Pager [phone]  01/29/2015,8:02 PM

## 2015-01-29 NOTE — Progress Notes (Signed)
PULMONARY / CRITICAL CARE MEDICINE   Name: Jonathon Galvan MRN: [id number] DOB: 1946/02/28    ADMISSION DATE:  02/08/2015 TODAY'S DATE: 01/29/2015  REFERRING MD :  EDP  CHIEF COMPLAINT:  Cardiac arrest   INITIAL PRESENTATION: 69 yo M with known history of HF, CAD and an ICD, found by EMS on 8/6 in PEA after he called them concerned with new dyspnea. They had ROSC after 12 minutes of CPR. Intubated in ED and admitted to PCCM.  BRIEF PATIENT DESCRIPTOR: 69 year old male w/ known history of nonischemic CM, systolic HF, CAD, ICD placement and left heart cath (2015), DM T2, tobacco abuse, Hep C, HTN, LBBB, and cocaine abuse. On 8/6 he called EMS emergently complaining of dyspnea. At time of EMS arrival was found in PEA. HR in 58s, ACLS initiated. Received 4 rounds of Epinephrine, 1 amp D 50, and 300 amio when HR went into wide complex rhythm. ROSC estimated at least 12 minutes. Arrived to ER unresponsive w/ ambu-bag assisted ventilation, w/ HR 50s Sinus brady. [employer] tech reports slow AF prior to arrest. His AICD is set at 200, it did not shock the pt. Patient had positive UDP for cocaine. He had a prior cardiac arrest associated with cocaine use. He was intubated by EDP. PCCM asked to admit. Patient was started on hypothermia and supportive care.  STUDIES:  8/7 Head CT>> small vessel disease, no acute findings 8/7 Echo >>  SIGNIFICANT EVENTS: 8/6 Cardiac arrest 8/6 intubated and admitted to PCCM  LINES/DRAINS: L IJ CVL 8/7 >>> R Radial Arterial Line 8/7 >>>  VITAL SIGNS: Temp:  [90.7 F (32.6 C)-92.7 F (33.7 C)] 92.7 F (33.7 C) (08/08 0700) Pulse Rate:  [47-52] 50 (08/08 0700) Resp:  [0-18] 14 (08/08 0700) BP: (118-182)/(67-93) 182/93 mmHg (08/08 0600) SpO2:  [98 %-100 %] 100 % (08/08 0700) Arterial Line BP: (95-170)/(53-103) 140/74 mmHg (08/08 0700) FiO2 (%):  [30 %-50 %] 30 % (08/08 0741) Weight:  [197 lb 5 oz (89.5 kg)] 197 lb 5 oz (89.5 kg) (08/08 0439) HEMODYNAMICS: CVP:   [5 mmHg-7 mmHg] 7 mmHg VENTILATOR SETTINGS: Vent Mode:  [-] PRVC FiO2 (%):  [30 %-50 %] 30 % Set Rate:  [18 bmp] 18 bmp Vt Set:  [620 mL] 620 mL PEEP:  [5 cmH20] 5 cmH20 Plateau Pressure:  [12 cmH20-24 cmH20] 23 cmH20 INTAKE / OUTPUT:  Intake/Output Summary (Last 24 hours) at 01/29/15 0754 Last data filed at 01/29/15 0700  Gross per 24 hour  Intake 1838.1 ml  Output   1305 ml  Net  533.1 ml    PHYSICAL EXAMINATION: General:  Well nourished AAM currently on full vent support, hypothermia, sedated  Neuro:  Sedated, paralyzed. No response HEENT:  Orally intubated  Cardiovascular: Huston Foley, distant S1S2, unable to appreciate m/r/g, trace pedal edema Lungs: Diminished BS bibasilar, no wheezes Abdomen:  Soft, non-tender + bowel sounds  Musculoskeletal:  Left AKA  Skin:  Warm, intact   LABS:  CBC  Recent Labs Lab 02/18/2015 2258  01/28/15 0532  01/28/15 0911 01/28/15 1554 01/29/15 0455  WBC 21.6*  --  16.7*  --   --   --  14.6*  HGB 12.8*  < > 12.5*  < > 13.6 13.9 11.9*  HCT 39.9  < > 38.1*  < > 40.0 41.0 35.2*  PLT 210  --  182  --   --   --  172  < > = values in this interval not displayed. Coag's  Recent Labs  Lab 02/07/2015 2258 01/28/15 0433 01/28/15 0830 01/28/15 0846  APTT  --  31 55*  --   INR 1.29  --   --  1.41   BMET  Recent Labs Lab 01/28/15 2000 01/29/15 0030 01/29/15 0455  NA 141 140 143  K 3.2* 3.2* 3.4*  CL 110 110 111  CO2 21* 21* 21*  BUN 16 17 19   CREATININE 2.46* 2.61* 2.75*  GLUCOSE 82 170* 98   Electrolytes  Recent Labs Lab 01/28/15 0140 01/28/15 0532 01/28/15 2000 01/29/15 0030 01/29/15 0455  CALCIUM  --  7.7* 8.3* 8.2* 8.1*  MG 2.0 1.8  --   --   --   PHOS 3.5 1.1*  --   --   --    Sepsis Markers  Recent Labs Lab 02/21/2015 2301 01/28/15 0140 01/29/15 0455  LATICACIDVEN 5.77*  --   --   PROCALCITON  --  2.21 9.00   ABG  Recent Labs Lab 02/08/2015 2327 01/28/15 0031  PHART 7.248* 7.353  PCO2ART 53.1* 46.4*   PO2ART 100.0 120.0*   Liver Enzymes  Recent Labs Lab 02/21/2015 2258 01/28/15 0532  AST 34 30  ALT 15* 17  ALKPHOS 57 58  BILITOT 1.1 1.8*  ALBUMIN 3.0* 2.8*   Cardiac Enzymes  Recent Labs Lab 02/18/2015 2258  TROPONINI 0.10*   Glucose  Recent Labs Lab 01/28/15 1617 01/28/15 1809 01/28/15 1950 01/28/15 2226 01/29/15 0034 01/29/15 0403  GLUCAP 87 80 75 70 163* 93    Imaging No results found.   ASSESSMENT / PLAN:  PULMONARY OETT 8/6 >>> A: Acute hypoxic respiratory failure s/p cardiopulmonary arrest  Pulmonary infiltrates, R >L  - likely edema but cannot r/o aspiration P:   Full vent support  Order ABG, f/u in AM CXR in am  See ID  CARDIOVASCULAR CVL R IJ 8/7 >>> A: PEA arrest with known NICM and cocaine abuse, on hypothermia protocols, warming today Hx of CAD NICM w/ EF 30-35% w/ AICD  Hx of PEA due to cocaine abuse Hx HTN Possible paroxysmal afib, NSR now P:  Cards following Echo pending AICD interrogation needed On heparin drip for afib and ACS precautions, monitoring CBC Hold BB given bradycardia Continue ASA, home Lipitor  RENAL A:  Stage 3 CKD Acute on chronic kidney injury (Cr 2.75, baseline ~1.8) Hypokalemia  Hypocalcemia Elevated lactate in setting of ischemia P: BMP q12 hrs. Replace K as needed. Will reorder lactate with the next blood draw. Renal dose meds. IVF resuscitation. Replace electrolytes as indicated.  GASTROINTESTINAL A:   No acute issues P:   PPI qd Start TF this am  HEMATOLOGIC A:   Leukocytosis - trending down P:  Follow CBC  Heparin, no evidence of bleeding  INFECTIOUS A:   Leukocytosis, infiltrates on CXR, procalcitonin elevation >> consider aspiration PNA P:   BCx2 8/7>>> Sputum 8/7 - moderate WBC, few gram neg and gram positive rods  Abx: Unasyn start day 8/7, day 2/x  ENDOCRINE A:   DM type 2 w/ hyperglycemia  P:   SSI, TF coverage  NEUROLOGIC A:   Acute encephalopathy   Depression Cocaine abuse P:   RASS goal: -4 Nimbex, Fentanyl drip, Versed Head CT negative Hypothermia protocol  Home Elavil continued   FAMILY  - Updates:   - Inter-disciplinary family meet or Palliative Care meeting due by: 8/14  Attending Note:  69 year male cocaine addict, arrested, undergoing hypothermia, now rewarming, will supplement K, continue levophed for MAP of 85 mmHg  then once rewarming is complete will drop target MAP to 65 mmHg.  Continue IVF and monitor renal function.  Will order a head CT and EEG for AM and likely call neuro in AM inorder to to determine brain function prior to family discussion.  The patient is critically ill with multiple organ systems failure and requires high complexity decision making for assessment and support, frequent evaluation and titration of therapies, application of advanced monitoring technologies and extensive interpretation of multiple databases.   Critical Care Time devoted to patient care services described in this note is  35  Minutes. This time reflects time of care of this signee Dr Koren Bound. This critical care time does not reflect procedure time, or teaching time or supervisory time of PA/NP/Med student/Med Resident etc but could involve care discussion time.  Alyson Reedy, M.D. [facility] Pulmonary/Critical Care Medicine. Pager: [phone]. After hours pager: [phone].

## 2015-01-29 NOTE — Progress Notes (Signed)
EEG Completed; Results Pending  

## 2015-01-29 NOTE — Progress Notes (Signed)
ANTICOAGULATION CONSULT NOTE - Initial Consult  Pharmacy Consult for Heparin  Indication: atrial fibrillation, cardiac arrest  No Known Allergies  Patient Measurements: Height: 6' (182.9 cm) Weight: 191 lb 12.8 oz (87 kg) IBW/kg (Calculated) : 77.6  Vital Signs: Temp: 91.6 F (33.1 C) (08/08 0100) Temp Source: Core (Comment) (08/08 0100) BP: 131/75 mmHg (08/08 0000) Pulse Rate: 50 (08/08 0100)  Labs:  Recent Labs  02/11/2015 2258  01/28/15 0433  01/28/15 0532 01/28/15 0640 01/28/15 0830 01/28/15 0846 01/28/15 0911 01/28/15 1200 01/28/15 1554 01/28/15 2000 01/28/15 2230 01/29/15 0030  HGB 12.8*  < >  --   < > 12.5* 14.3  --   --  13.6  --  13.9  --   --   --   HCT 39.9  < >  --   < > 38.1* 42.0  --   --  40.0  --  41.0  --   --   --   PLT 210  --   --   --  182  --   --   --   --   --   --   --   --   --   APTT  --   --  31  --   --   --  55*  --   --   --   --   --   --   --   LABPROT 16.3*  --   --   --   --   --   --  17.3*  --   --   --   --   --   --   INR 1.29  --   --   --   --   --   --  1.41  --   --   --   --   --   --   HEPARINUNFRC  --   --   --   --   --   --   --   --   --  <0.10*  --   --  0.30  --   CREATININE 2.55*  < >  --   < > 2.16* 2.20*  --   --  2.30*  --  2.40* 2.46*  --  2.61*  TROPONINI 0.10*  --   --   --   --   --   --   --   --   --   --   --   --   --   < > = values in this interval not displayed.  Estimated Creatinine Clearance: 28.9 mL/min (by C-G formula based on Cr of 2.61).   Medical History: Past Medical History  Diagnosis Date  . CHF (congestive heart failure)     EF 30-35%, nonischemic  . CAD (coronary artery disease)   . Hyperlipidemia     hypertriglyceridemia on niaspan and lipitor.  . Asthma   . Allergy   . PVD (peripheral vascular disease)     s/p left AKA @ [facility] (MRSA septic arthritis)  . Diabetes mellitus     dx in 2009, DKA, came unresponsive  . Tobacco abuse   . Hepatitis C   . Hypertension   . LBBB  (left bundle branch block)   . Anemia of chronic disease     baseline Hgb now 13  . Thrombocytopenia     07/2007, went down to 90,000. likely 2/2 neurontin.  . Cocaine use  s/p cardiopulmonary arrest 12/25/2002 ([facility])  . Cholelithiasis     documented on CT abdomen in 07/2007  . Non-ischemic cardiomyopathy 04/2014    BiV-ICD placed    Assessment: [name] s/p cardiac arrest with ROSC, on heparin for afib, CBC good, noted renal dysfunction, baseline INR 1.29, other labs reviewed. Pt is on the 33 degree arctic sun protocol and scheduled to start re-warming at 0430 this AM. HL is at low end of therapeutic range.   Goal of Therapy:  Heparin level 0.3-0.7 units/ml Monitor platelets by anticoagulation protocol: Yes   Plan:  -Increase heparin to 1100 units/hr to prevent low level (heparin requirements will likely increase at patient re-warms today) -0900 HL -Daily CBC/HL -Monitor for bleeding  Jonathon Galvan 01/29/2015,1:36 AM

## 2015-01-29 NOTE — Progress Notes (Signed)
Called and spoke with [facility] RN concerning pt's K level of 3.2, infomed she would let the MD know. No new orders at this time. Will continue to monitor.

## 2015-01-30 ENCOUNTER — Inpatient Hospital Stay (HOSPITAL_COMMUNITY): Payer: Medicare Other

## 2015-01-30 DIAGNOSIS — N189 Chronic kidney disease, unspecified: Secondary | ICD-10-CM

## 2015-01-30 DIAGNOSIS — G9340 Encephalopathy, unspecified: Secondary | ICD-10-CM

## 2015-01-30 DIAGNOSIS — N179 Acute kidney failure, unspecified: Secondary | ICD-10-CM

## 2015-01-30 DIAGNOSIS — I469 Cardiac arrest, cause unspecified: Secondary | ICD-10-CM

## 2015-01-30 LAB — HEPARIN LEVEL (UNFRACTIONATED)
HEPARIN UNFRACTIONATED: 0.42 [IU]/mL (ref 0.30–0.70)
HEPARIN UNFRACTIONATED: 0.38 [IU]/mL (ref 0.30–0.70)

## 2015-01-30 LAB — BASIC METABOLIC PANEL
Anion gap: 10 (ref 5–15)
Anion gap: 13 (ref 5–15)
BUN: 22 mg/dL — ABNORMAL HIGH (ref 6–20)
BUN: 25 mg/dL — AB (ref 6–20)
CALCIUM: 7.3 mg/dL — AB (ref 8.9–10.3)
CO2: 21 mmol/L — ABNORMAL LOW (ref 22–32)
CO2: 21 mmol/L — ABNORMAL LOW (ref 22–32)
CREATININE: 3.99 mg/dL — AB (ref 0.61–1.24)
Calcium: 8.1 mg/dL — ABNORMAL LOW (ref 8.9–10.3)
Chloride: 112 mmol/L — ABNORMAL HIGH (ref 101–111)
Chloride: 109 mmol/L (ref 101–111)
Creatinine, Ser: 3.49 mg/dL — ABNORMAL HIGH (ref 0.61–1.24)
GFR calc Af Amer: 16 mL/min — ABNORMAL LOW (ref >60)
GFR calc non Af Amer: 16 mL/min — ABNORMAL LOW (ref >60)
GFR calc non Af Amer: 14 mL/min — ABNORMAL LOW (ref >60)
GFR, EST AFRICAN AMERICAN: 19 mL/min — AB (ref >60)
Glucose, Bld: 106 mg/dL — ABNORMAL HIGH (ref 65–99)
Glucose, Bld: 108 mg/dL — ABNORMAL HIGH (ref 65–99)
POTASSIUM: 3.7 mmol/L (ref 3.5–5.1)
Potassium: 3.6 mmol/L (ref 3.5–5.1)
Sodium: 143 mmol/L (ref 135–145)
Sodium: 143 mmol/L (ref 135–145)

## 2015-01-30 LAB — BLOOD GAS, ARTERIAL
Acid-base deficit: 3.8 mmol/L — ABNORMAL HIGH (ref 0.0–2.0)
Bicarbonate: 20.2 mEq/L (ref 20.0–24.0)
Drawn by: 398981
FIO2: 0.3
MECHVT: 620 mL
O2 Saturation: 94 %
PATIENT TEMPERATURE: 98.6
PCO2 ART: 33.1 mmHg — AB (ref 35.0–45.0)
PEEP: 5 cmH2O
RATE: 18 resp/min
TCO2: 21.2 mmol/L (ref 0–100)
pH, Arterial: 7.402 (ref 7.350–7.450)
pO2, Arterial: 73.8 mmHg — ABNORMAL LOW (ref 80.0–100.0)

## 2015-01-30 LAB — GLUCOSE, CAPILLARY
GLUCOSE-CAPILLARY: 91 mg/dL (ref 65–99)
GLUCOSE-CAPILLARY: 100 mg/dL — AB (ref 65–99)
Glucose-Capillary: 107 mg/dL — ABNORMAL HIGH (ref 65–99)
Glucose-Capillary: 120 mg/dL — ABNORMAL HIGH (ref 65–99)
Glucose-Capillary: 165 mg/dL — ABNORMAL HIGH (ref 65–99)
Glucose-Capillary: 76 mg/dL (ref 65–99)

## 2015-01-30 LAB — CBC
HCT: 37 % — ABNORMAL LOW (ref 39.0–52.0)
HEMOGLOBIN: 12.1 g/dL — AB (ref 13.0–17.0)
MCH: 26.1 pg (ref 26.0–34.0)
MCHC: 32.7 g/dL (ref 30.0–36.0)
MCV: 79.7 fL (ref 78.0–100.0)
Platelets: 164 10*3/uL (ref 150–400)
RBC: 4.64 MIL/uL (ref 4.22–5.81)
RDW: 15.7 % — ABNORMAL HIGH (ref 11.5–15.5)
WBC: 15.3 10*3/uL — ABNORMAL HIGH (ref 4.0–10.5)

## 2015-01-30 LAB — CULTURE, RESPIRATORY W GRAM STAIN

## 2015-01-30 LAB — MAGNESIUM: Magnesium: 1.6 mg/dL — ABNORMAL LOW (ref 1.7–2.4)

## 2015-01-30 LAB — CULTURE, RESPIRATORY: SPECIAL REQUESTS: NORMAL

## 2015-01-30 LAB — PHOSPHORUS: PHOSPHORUS: 3.4 mg/dL (ref 2.5–4.6)

## 2015-01-30 LAB — PROCALCITONIN: Procalcitonin: 8.23 ng/mL

## 2015-01-30 MED ORDER — MAGNESIUM SULFATE IN D5W 10-5 MG/ML-% IV SOLN
1.0000 g | Freq: Once | INTRAVENOUS | Status: AC
  Administered 2015-01-30: 1 g via INTRAVENOUS
  Filled 2015-01-30: qty 100

## 2015-01-30 MED ORDER — MAGNESIUM SULFATE 2 GM/50ML IV SOLN: 2.0000 g | Freq: Once | INTRAVENOUS | Status: DC

## 2015-01-30 MED ORDER — VITAL AF 1.2 CAL PO LIQD
1000.0000 mL | ORAL | Status: DC
  Administered 2015-01-30 – 2015-02-01 (×3): 1000 mL
  Filled 2015-01-30 (×7): qty 1000

## 2015-01-30 MED ORDER — VITAL HIGH PROTEIN PO LIQD: 1000.0000 mL | ORAL | Status: DC

## 2015-01-30 MED ORDER — CHLORHEXIDINE GLUCONATE 0.12 % MT SOLN
OROMUCOSAL | Status: AC
  Filled 2015-01-30: qty 15

## 2015-01-30 MED ORDER — FUROSEMIDE 10 MG/ML IJ SOLN
80.0000 mg | Freq: Once | INTRAMUSCULAR | Status: AC
  Administered 2015-01-30: 80 mg via INTRAVENOUS
  Filled 2015-01-30: qty 8

## 2015-01-30 NOTE — Progress Notes (Signed)
ANTICOAGULATION CONSULT NOTE - Initial Consult  Pharmacy Consult for Heparin  Indication: atrial fibrillation, cardiac arrest  No Known Allergies  Patient Measurements: Height: 6' (182.9 cm) Weight: 200 lb 6.4 oz (90.9 kg) (with pads) IBW/kg (Calculated) : 77.6  Vital Signs: Temp: 98.4 F (36.9 C) (08/09 0500) Temp Source: Core (Comment) (08/09 0500) BP: 90/47 mmHg (08/09 0400) Pulse Rate: 65 (08/09 0500)  Labs:  Recent Labs  02/15/2015 2258  01/28/15 0433  01/28/15 0532  01/28/15 0830 01/28/15 0846  01/28/15 1554  01/29/15 0030 01/29/15 0455 01/29/15 0822 01/29/15 1021 01/29/15 1906 01/30/15 0415  HGB 12.8*  < >  --   < > 12.5*  < >  --   --   < > 13.9  --   --  11.9*  --   --   --  12.1*  HCT 39.9  < >  --   < > 38.1*  < >  --   --   < > 41.0  --   --  35.2*  --   --   --  37.0*  PLT 210  --   --   --  182  --   --   --   --   --   --   --  172  --   --   --  164  APTT  --   --  31  --   --   --  55*  --   --   --   --   --   --   --   --   --   --   LABPROT 16.3*  --   --   --   --   --   --  17.3*  --   --   --   --   --   --   --   --   --   INR 1.29  --   --   --   --   --   --  1.41  --   --   --   --   --   --   --   --   --   HEPARINUNFRC  --   --   --   --   --   --   --   --   < >  --   < >  --   --   --  0.28* 0.17* 0.42  CREATININE 2.55*  < >  --   < > 2.16*  < >  --   --   < > 2.40*  < > 2.61* 2.75* 2.83*  --   --   --   TROPONINI 0.10*  --   --   --   --   --   --   --   --   --   --   --   --   --   --   --   --   < > = values in this interval not displayed.  Estimated Creatinine Clearance: 26.7 mL/min (by C-G formula based on Cr of 2.83).   Medical History: Past Medical History  Diagnosis Date  . CHF (congestive heart failure)     EF 30-35%, nonischemic  . CAD (coronary artery disease)   . Hyperlipidemia     hypertriglyceridemia on niaspan and lipitor.  . Asthma   . Allergy   . PVD (peripheral vascular disease)  s/p left AKA @ [facility]  (MRSA septic arthritis)  . Diabetes mellitus     dx in 2009, DKA, came unresponsive  . Tobacco abuse   . Hepatitis C   . Hypertension   . LBBB (left bundle branch block)   . Anemia of chronic disease     baseline Hgb now 13  . Thrombocytopenia     07/2007, went down to 90,000. likely 2/2 neurontin.  . Cocaine use     s/p cardiopulmonary arrest 12/25/2002 ([facility])  . Cholelithiasis     documented on CT abdomen in 07/2007  . Non-ischemic cardiomyopathy 04/2014    BiV-ICD placed    Assessment: [name] s/p cardiac arrest with ROSC, on heparin for afib, CBC good, noted renal dysfunction, baseline INR 1.29, other labs reviewed. Now s/p arctic sun protocol. HL is therapeutic at 0.42 this AM.   Goal of Therapy:  Heparin level 0.3-0.7 units/ml Monitor platelets by anticoagulation protocol: Yes   Plan:  -Continue heparin at 1400 units/hr -1300 HL -Daily CBC/HL -Monitor for bleeding  Abran Duke 01/30/2015,5:43 AM

## 2015-01-30 NOTE — Progress Notes (Signed)
EEG completed, results pending. 

## 2015-01-30 NOTE — Progress Notes (Signed)
Initial Nutrition Assessment  INTERVENTION:   Initiate Vital AF 1.2 @ 25 ml/hr via OG tube and increase by 10 ml every 4 hours to goal rate of 65 ml/hr.   Tube feeding regimen provides 1872 kcal (96% of needs), 117 grams of protein, and 1265 ml of H2O.    NUTRITION DIAGNOSIS:   Inadequate oral intake related to inability to eat as evidenced by NPO status.   GOAL:   Patient will meet greater than or equal to 90% of their needs   MONITOR:   Vent status, Labs, I & O's, TF tolerance  REASON FOR ASSESSMENT:   Consult Enteral/tube feeding initiation and management  ASSESSMENT:   Pt with history of nonischemic CM, systolic HF, CAD, ICD placement and left heart cath (2015), DM T2, tobacco abuse, Hep C, HTN, LBBB, and cocaine abuse. On 8/6 he called EMS emergently complaining of dyspnea. At time of EMS arrival was found in PEA.  Patient is currently intubated on ventilator support MV: 10.3 L/min Temp (24hrs), Avg:98.4 F (36.9 C), Min:97.7 F (36.5 C), Max:98.8 F (37.1 C)  Labs reviewed: magnesium decreased OG tube, no xray Discussed with RN. Paged MD for consult orders to start TF.  Nutrition-Focused physical exam completed. Findings are no fat depletion, no muscle depletion, and mild edema.  Hx of right AKA  Diet Order:  Diet NPO time specified  Skin:  Reviewed, no issues  Last BM:  unknown  Height:   Ht Readings from Last 1 Encounters:  02/18/2015 6' (1.829 m)    Weight:   Wt Readings from Last 1 Encounters:  01/30/15 200 lb 6.4 oz (90.9 kg)    Ideal Body Weight:  74.4 kg  BMI:  29.5  Estimated Nutritional Needs:   Kcal:  1946  Protein:  110-125 grams  Fluid:  > 2 L/day  EDUCATION NEEDS:   No education needs identified at this time  Kendell Bane RD, LDN, CNSC [phone] Pager [phone] After Hours Pager

## 2015-01-30 NOTE — Progress Notes (Signed)
   01/30/15 1600  Clinical Encounter Type  Visited With Family;Health care provider  Visit Type Initial;Spiritual support;Social support;Critical Care;Patient actively dying  Referral From Nurse  Spiritual Encounters  Spiritual Needs Emotional  Stress Factors  Patient Stress Factors Loss;Loss of control;Major life changes   Chaplain was paged to the patient's room this afternoon. Chaplain was notified that the family had consulted with the patient's attending physician earlier today and received a poor prognosis. When chaplain arrived, patient's sister and one of the patient's daughters were weeping in the restroom. When family came out of the restroom, chaplain asked if they needed anything. They did not identify any needs at this time. Patient's daughter and sister seem more calm. Although, patient's sister began to weep again during a phone call. Family is to have a discussion possibly tomorrow regarding withdrawal of life-prolonging care. Chaplain will encourage another chaplain to follow up with family in the morning.  Cranston Neighbor, Chaplain  4:51 PM

## 2015-01-30 NOTE — Progress Notes (Signed)
Following at a distance. Rhythm remains stable. Today's EEG results noted. Poor neurologic prognosis.

## 2015-01-30 NOTE — Progress Notes (Signed)
ANTICOAGULATION CONSULT NOTE - Follow Up Consult  Pharmacy Consult for heparin Indication: atrial fibrillation  No Known Allergies  Patient Measurements: Height: 6' (182.9 cm) Weight: 200 lb 6.4 oz (90.9 kg) (with pads) IBW/kg (Calculated) : 77.6 Heparin Dosing Weight: 77.6  Vital Signs: Temp: 98.4 F (36.9 C) (08/09 1200) Temp Source: Core (Comment) (08/09 1200) BP: 140/95 mmHg (08/09 1200) Pulse Rate: 72 (08/09 1300)  Labs:  Recent Labs  02/01/2015 2258  01/28/15 0433  01/28/15 0532  01/28/15 0830 01/28/15 0846  01/28/15 1554  01/29/15 0455 01/29/15 0822  01/29/15 1906 01/30/15 0415 01/30/15 1310  HGB 12.8*  < >  --   < > 12.5*  < >  --   --   < > 13.9  --  11.9*  --   --   --  12.1*  --   HCT 39.9  < >  --   < > 38.1*  < >  --   --   < > 41.0  --  35.2*  --   --   --  37.0*  --   PLT 210  --   --   --  182  --   --   --   --   --   --  172  --   --   --  164  --   APTT  --   --  31  --   --   --  55*  --   --   --   --   --   --   --   --   --   --   LABPROT 16.3*  --   --   --   --   --   --  17.3*  --   --   --   --   --   --   --   --   --   INR 1.29  --   --   --   --   --   --  1.41  --   --   --   --   --   --   --   --   --   HEPARINUNFRC  --   --   --   --   --   --   --   --   < >  --   < >  --   --   < > 0.17* 0.42 0.38  CREATININE 2.55*  < >  --   < > 2.16*  < >  --   --   < > 2.40*  < > 2.75* 2.83*  --   --  3.49*  --   TROPONINI 0.10*  --   --   --   --   --   --   --   --   --   --   --   --   --   --   --   --   < > = values in this interval not displayed.  Estimated Creatinine Clearance: 21.6 mL/min (by C-G formula based on Cr of 3.49).   Medications:  Prescriptions prior to admission  Medication Sig Dispense Refill Last Dose  . amLODipine (NORVASC) 10 MG tablet Take 10 mg by mouth daily.   02/11/2015 at Unknown time  . aspirin EC 81 MG tablet Take 1 tablet (81 mg total) by mouth daily. 30 tablet 0 02/03/2015 at Unknown  time  . atorvastatin (LIPITOR)  10 MG tablet Take 1 tablet (10 mg total) by mouth daily. 30 tablet 5 01/29/2015 at Unknown time  . carvedilol (COREG) 12.5 MG tablet Take 2 tablets (25 mg total) by mouth 2 (two) times daily with a meal. 30 tablet 0 01/31/2015 at 0930  . cetirizine (ZYRTEC) 10 MG tablet Take 1 tablet (10 mg total) by mouth daily. 30 tablet 5 01/23/2015 at Unknown time  . enalapril (VASOTEC) 2.5 MG tablet Take 1 tablet (2.5 mg total) by mouth daily. 30 tablet 0 02/15/2015 at Unknown time  . furosemide (LASIX) 80 MG tablet Take 0.5 tablets (40 mg total) by mouth 2 (two) times daily. 60 tablet 11 02/21/2015 at Unknown time  . gabapentin (NEURONTIN) 300 MG capsule Take 300 mg by mouth 3 (three) times daily.   01/22/2015 at Unknown time  . glipiZIDE (GLUCOTROL) 10 MG tablet Take 1 tablet by mouth daily before breakfast.    02/14/2015 at Unknown time  . isosorbide-hydrALAZINE (BIDIL) 20-37.5 MG per tablet Take 2 tablets by mouth 2 (two) times daily. 120 tablet 0 02/04/2015 at Unknown time  . metFORMIN (GLUCOPHAGE) 1000 MG tablet Take 1 tablet (1,000 mg total) by mouth 2 (two) times daily with a meal. 60 tablet 11 02/06/2015 at Unknown time  . amitriptyline (ELAVIL) 100 MG tablet Take 100 mg by mouth at bedtime.   01/26/2015  . niacin (NIASPAN) 500 MG CR tablet Take 500 mg by mouth at bedtime.    01/26/2015  . oxyCODONE-acetaminophen (PERCOCET) 10-325 MG per tablet Take 1 tablet by mouth every 12 (twelve) hours as needed for pain. 30 tablet 0   . venlafaxine (EFFEXOR) 50 MG tablet Take 1 tablet (50 mg total) by mouth at bedtime. (Patient not taking: Reported on 01/28/2015) 30 tablet 5 Not Taking at Unknown time  . zolpidem (AMBIEN) 10 MG tablet Take 10 mg by mouth at bedtime as needed for sleep.   unk    Assessment: 69 yo man admitted admitted 02/04/2015 s/p PEA arrest with ROSC, rewarmed s/p arctic sun protocol.   Pharmacy consulted to dose heparin.  PMH: CHF, CAD, HLD, asthma, PVD, DM, hep c, HTN, LBBB, anemia, thrombocytopenia,  cardiomyopathy, prior cardiac arrest w/ cocaine use  Anticoagulation: hep gtt s/p cardiac arrest, AF HL 0.38 therapeutic (goal 0.3-0.7), CBC stable on heparin 1400 units/h   Goal of Therapy:  Heparin level 0.3-0.7 units/ml Monitor platelets by anticoagulation protocol: Yes   Plan:  Continue heparin 1400 units/hr Daily HL & CBC Monitor s/sx bleeding   Hillery Aldo, Pharm.D. PGY2 Cardiology Pharmacy Resident Pager: [phone] 01/30/2015,1:52 PM

## 2015-01-30 NOTE — Procedures (Signed)
EEG report.  Brief clinical history:  69 yo M with known history of HF, CAD and an ICD, found by EMS on 8/6 in PEA after he called them concerned with new dyspnea. They had ROSC after 12 minutes of CPR. Intubated in ED and admitted to PCCM. Patient underwent hypothermia protocol and is currently rewarmed at 37 degrees.  The patient is sedated with fentanyl, versed, and nimbex, intubated on the vent, and rewarming at 37 degrees. No activating procedures performed.  Description: as the study begins and throughout the entire recording, there is evidence of marked diffuse background suppression intermixed with periods of low amplitude theta activity predominantly 4 Hz that can last up to 5 seconds. No electrographic seizures noted. EKG showed sinus rhythm.  Impression: this is an abnormal EEG performed in the ICU setting in a patient that already completed rewarming. 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 Of note, post anoxic burst suppression pattern usually confers a poor prognosis. Clinical correlation is advised.   Wyatt Portela, MD [facility]

## 2015-01-30 NOTE — Progress Notes (Signed)
PULMONARY / CRITICAL CARE MEDICINE   Name: Jonathon Galvan MRN: [id number] DOB: Nov 07, 1945    ADMISSION DATE:  02/15/2015 TODAY'S DATE: 01/29/2015  REFERRING MD :  EDP  CHIEF COMPLAINT:  Cardiac arrest   INITIAL PRESENTATION: 69 yo M with known history of HF, CAD and an ICD, found by EMS on 8/6 in PEA after he called them concerned with new dyspnea. They had ROSC after 12 minutes of CPR. Intubated in ED and admitted to PCCM.  BRIEF PATIENT DESCRIPTOR: 69 year old male w/ known history of nonischemic CM, systolic HF, CAD, ICD placement and left heart cath (2015), DM T2, tobacco abuse, Hep C, HTN, LBBB, and cocaine abuse. On 8/6 he called EMS emergently complaining of dyspnea. At time of EMS arrival was found in PEA. HR in 58s, ACLS initiated. Received 4 rounds of Epinephrine, 1 amp D 50, and 300 amio when HR went into wide complex rhythm. ROSC estimated at least 12 minutes. Arrived to ER unresponsive w/ ambu-bag assisted ventilation, w/ HR 50s Sinus brady. [employer] tech reports slow AF prior to arrest. His AICD is set at 200, it did not shock the pt. Patient had positive UDP for cocaine. He had a prior cardiac arrest associated with cocaine use. He was intubated by EDP. PCCM asked to admit. Patient was started on hypothermia and supportive care.  STUDIES:  8/7 Head CT>> small vessel disease, no acute findings 8/7 Echo >> 8/7 EEG > abnormal due to lack of electro-cerebral activity (performed on sedation, while cool)  SIGNIFICANT EVENTS: 8/6 Cardiac arrest 8/6 intubated and admitted to [facility] 8/7 rewarmed, sedation back on for vent synchrony  LINES/DRAINS: L IJ CVL 8/7 >>> R Radial Arterial Line 8/7 >>>  VITAL SIGNS: Temp:  [94.1 F (34.5 C)-98.8 F (37.1 C)] 98.6 F (37 C) (08/09 0700) Pulse Rate:  [51-112] 66 (08/09 0700) Resp:  [0-22] 18 (08/09 0700) BP: (84-135)/(47-69) 100/53 mmHg (08/09 0600) SpO2:  [86 %-100 %] 98 % (08/09 0700) Arterial Line BP: (98-160)/(61-99) 124/66 mmHg  (08/09 0700) FiO2 (%):  [30 %-50 %] 30 % (08/09 0331) Weight:  [200 lb 6.4 oz (90.9 kg)] 200 lb 6.4 oz (90.9 kg) (08/09 0423) HEMODYNAMICS: CVP:  [1 mmHg-9 mmHg] 5 mmHg VENTILATOR SETTINGS: Vent Mode:  [-] PRVC FiO2 (%):  [30 %-50 %] 30 % Set Rate:  [18 bmp] 18 bmp Vt Set:  [620 mL] 620 mL PEEP:  [5 cmH20] 5 cmH20 Plateau Pressure:  [21 cmH20-22 cmH20] 21 cmH20 INTAKE / OUTPUT:  Intake/Output Summary (Last 24 hours) at 01/30/15 0803 Last data filed at 01/30/15 0700  Gross per 24 hour  Intake 1424.87 ml  Output    480 ml  Net 944.87 ml    PHYSICAL EXAMINATION:  Gen: asleep on vent HENT: NCAT, EOMi, PERRL (sluggish) PULM: rhonchi bilaterally CV: RRR, no mgr AB:  BS+, soft MSK: s/p L AKA, normal bulk bilat Neuro: no eye opening, corneal intact, cough intact, flex left hand (reach for tube?), withdraws to pain R foot/leg  LABS:  CBC  Recent Labs Lab 01/28/15 0532  01/28/15 1554 01/29/15 0455 01/30/15 0415  WBC 16.7*  --   --  14.6* 15.3*  HGB 12.5*  < > 13.9 11.9* 12.1*  HCT 38.1*  < > 41.0 35.2* 37.0*  PLT 182  --   --  172 164  < > = values in this interval not displayed. Coag's  Recent Labs Lab 02/15/15 2258 01/28/15 0433 01/28/15 0830 01/28/15 0846  APTT  --  31 55*  --   INR 1.29  --   --  1.41   BMET  Recent Labs Lab 01/29/15 0455 01/29/15 0822 01/30/15 0415  NA 143 142 143  K 3.4* 3.8 3.7  CL 111 110 112*  CO2 21* 21* 21*  BUN 19 19 22*  CREATININE 2.75* 2.83* 3.49*  GLUCOSE 98 100* 106*   Electrolytes  Recent Labs Lab 01/28/15 0140 01/28/15 0532  01/29/15 0455 01/29/15 0822 01/30/15 0415  CALCIUM  --  7.7*  < > 8.1* 7.9* 7.3*  MG 2.0 1.8  --   --   --  1.6*  PHOS 3.5 1.1*  --   --   --  3.4  < > = values in this interval not displayed. Sepsis Markers  Recent Labs Lab 02/11/2015 2301 01/28/15 0140 01/29/15 0455 01/29/15 1025 01/29/15 1259 01/30/15 0415  LATICACIDVEN 5.77*  --   --  1.4 1.3  --   PROCALCITON  --  2.21  9.00  --   --  8.23   ABG  Recent Labs Lab February 11, 2015 2327 01/28/15 0031 01/30/15 0402  PHART 7.248* 7.353 7.402  PCO2ART 53.1* 46.4* 33.1*  PO2ART 100.0 120.0* 73.8*   Liver Enzymes  Recent Labs Lab 11-Feb-2015 2258 01/28/15 0532  AST 34 30  ALT 15* 17  ALKPHOS 57 58  BILITOT 1.1 1.8*  ALBUMIN 3.0* 2.8*   Cardiac Enzymes  Recent Labs Lab 02-11-15 2258  TROPONINI 0.10*   Glucose  Recent Labs Lab 01/29/15 1217 01/29/15 1659 01/29/15 1947 01/29/15 2347 01/30/15 0408 01/30/15 0742  GLUCAP 88 86 71 165* 107* 91    Imaging  8/9 CXR > bibasilar hazy opacities, L IJ in place, ICD in place, bilateral effusion, ETT in place  ASSESSMENT / PLAN:  PULMONARY OETT 8/6 >>> A: Acute hypoxic respiratory failure s/p cardiopulmonary arrest  Pulmonary infiltrates, R >L  - likely edema but cannot r/o aspiration P:   Full vent support  Order ABG, f/u in AM Diurese today CXR in am  See ID  CARDIOVASCULAR CVL R IJ 8/7 >>> A: PEA arrest with known NICM and cocaine abuse Hx of CAD NICM w/ EF 30-35% w/ AICD  Hx of PEA due to cocaine abuse Hx HTN Possible paroxysmal afib, NSR now Pulmonary edema P:  Cards following F/U Echo  On heparin drip for afib and ACS precautions, monitoring CBC Continue ASA, home Lipitor Lasix x1 dose today  RENAL A:  Stage 3 CKD Acute on chronic kidney injury (Cr 3.4, baseline ~1.8) Hypokalemia > mild Hypocalcemia > mild P: BMP q12 hrs Replace K as needed Renal dose meds Hold IVF, diurese as volume overloaded Replace electrolytes as indicated.  GASTROINTESTINAL A:   No acute issues P:   PPI qd Continue tube feeding  HEMATOLOGIC A:   No acute issues P:  Follow CBC  Heparin, no evidence of bleeding  INFECTIOUS A:   Likely aspiration PNA P:   BCx2 8/7>>> Sputum 8/7 - moderate WBC, few gram neg and gram positive rods  Abx: Unasyn start day 8/7, day 3/x  ENDOCRINE A:   DM type 2 w/ hyperglycemia  P:   SSI, TF  coverage  NEUROLOGIC A:   Acute encephalopathy > withdrawing to pain, movements not clearly purposeful but only off sedation for 1 hour on my exam; high risk for anoxic injury but not clear at this point Depression Cocaine abuse P:   RASS goal: 0 D/C Nimbex, Versed Hold fentanyl gtt unless severe vent  dyssynchrony Repeat EEG today Maintain normothermia 72 hours D/C home Elavil    FAMILY  - Updates: updated daughters bedside today  - Inter-disciplinary family meet or Palliative Care meeting due by: 8/14    The patient is critically ill with multiple organ systems failure and requires high complexity decision making for assessment and support, frequent evaluation and titration of therapies, application of advanced monitoring technologies and extensive interpretation of multiple databases.   Critical Care Time devoted to patient care services described in this note is  35  Minutes. This time reflects time of care of this Tandy Gaw, MD. This critical care time does not reflect procedure time, or teaching time or supervisory time of PA/NP/Med student/Med Resident etc but could involve care discussion time.  Heber Hermann, MD [facility] Pager: [phone] Cell: [phone] After 3pm or if no response, call [phone]

## 2015-01-30 NOTE — Progress Notes (Signed)
  Echocardiogram 2D Echocardiogram has been performed.  Delcie Roch 01/30/2015, 2:24 PM

## 2015-01-31 ENCOUNTER — Inpatient Hospital Stay (HOSPITAL_COMMUNITY): Payer: Medicare Other

## 2015-01-31 DIAGNOSIS — G931 Anoxic brain damage, not elsewhere classified: Secondary | ICD-10-CM

## 2015-01-31 LAB — BASIC METABOLIC PANEL
ANION GAP: 10 (ref 5–15)
BUN: 27 mg/dL — ABNORMAL HIGH (ref 6–20)
CO2: 22 mmol/L (ref 22–32)
Calcium: 8.2 mg/dL — ABNORMAL LOW (ref 8.9–10.3)
Chloride: 113 mmol/L — ABNORMAL HIGH (ref 101–111)
Creatinine, Ser: 4.09 mg/dL — ABNORMAL HIGH (ref 0.61–1.24)
GFR calc Af Amer: 16 mL/min — ABNORMAL LOW (ref >60)
GFR calc non Af Amer: 14 mL/min — ABNORMAL LOW (ref >60)
Glucose, Bld: 135 mg/dL — ABNORMAL HIGH (ref 65–99)
Potassium: 3.7 mmol/L (ref 3.5–5.1)
SODIUM: 145 mmol/L (ref 135–145)

## 2015-01-31 LAB — GLUCOSE, CAPILLARY
GLUCOSE-CAPILLARY: 101 mg/dL — AB (ref 65–99)
GLUCOSE-CAPILLARY: 108 mg/dL — AB (ref 65–99)
GLUCOSE-CAPILLARY: 118 mg/dL — AB (ref 65–99)
GLUCOSE-CAPILLARY: 123 mg/dL — AB (ref 65–99)
Glucose-Capillary: 118 mg/dL — ABNORMAL HIGH (ref 65–99)
Glucose-Capillary: 119 mg/dL — ABNORMAL HIGH (ref 65–99)
Glucose-Capillary: 104 mg/dL — ABNORMAL HIGH (ref 65–99)

## 2015-01-31 LAB — CBC WITH DIFFERENTIAL/PLATELET
Basophils Absolute: 0 10*3/uL (ref 0.0–0.1)
Basophils Relative: 0 % (ref 0–1)
Eosinophils Absolute: 0.4 10*3/uL (ref 0.0–0.7)
Eosinophils Relative: 3 % (ref 0–5)
HCT: 35.7 % — ABNORMAL LOW (ref 39.0–52.0)
Hemoglobin: 11.8 g/dL — ABNORMAL LOW (ref 13.0–17.0)
Lymphocytes Relative: 24 % (ref 12–46)
Lymphs Abs: 2.7 10*3/uL (ref 0.7–4.0)
MCH: 26.8 pg (ref 26.0–34.0)
MCHC: 33.1 g/dL (ref 30.0–36.0)
MCV: 81 fL (ref 78.0–100.0)
Monocytes Absolute: 1.4 10*3/uL — ABNORMAL HIGH (ref 0.1–1.0)
Monocytes Relative: 12 % (ref 3–12)
Neutro Abs: 6.9 10*3/uL (ref 1.7–7.7)
Neutrophils Relative %: 61 % (ref 43–77)
Platelets: 169 10*3/uL (ref 150–400)
RBC: 4.41 MIL/uL (ref 4.22–5.81)
RDW: 16 % — ABNORMAL HIGH (ref 11.5–15.5)
WBC: 11.4 10*3/uL — ABNORMAL HIGH (ref 4.0–10.5)

## 2015-01-31 LAB — HEPARIN LEVEL (UNFRACTIONATED): Heparin Unfractionated: 0.26 IU/mL — ABNORMAL LOW (ref 0.30–0.70)

## 2015-01-31 MED ORDER — HEPARIN SODIUM (PORCINE) 5000 UNIT/ML IJ SOLN
5000.0000 [IU] | Freq: Three times a day (TID) | INTRAMUSCULAR | Status: DC
  Administered 2015-01-31 – 2015-02-02 (×6): 5000 [IU] via SUBCUTANEOUS
  Filled 2015-01-31 (×9): qty 1

## 2015-01-31 NOTE — Care Management Note (Addendum)
Case Management Note  Patient Details  Name: Jonathon Galvan MRN: [id number] Date of Birth: [birth year]  Subjective/Objective:  69 y.o. M admitted to 2H02 after being intubated secondary to ARF. Remains on Vent with poor prognosis per MD note. Unsure of discharge plan at present. MD Has been clear with family about possibility of Anoxic Encephalopathy.                    Action/Plan:Will continue to follow for disposition.   Expected Discharge Date:                  Expected Discharge Plan:     In-House Referral:  Clinical Social Work (Poor Prognosis per MD note)  Discharge planning Services  CM Consult  Post Acute Care Choice:    Choice offered to:     DME Arranged:    DME Agency:     HH Arranged:    HH Agency:     Status of Service:  In process, will continue to follow  Medicare Important Message Given:   y Date Medicare IM Given:   01/31/2015 Medicare IM give by:   Yvone Neu, RN, BSN Date Additional Medicare IM Given:    Additional Medicare Important Message give by:     If discussed at Long Length of Stay Meetings, dates discussed:    Additional Comments:  Yvone Neu, RN 01/31/2015, 8:22 AM y

## 2015-01-31 NOTE — Progress Notes (Signed)
Fentanyl 150 ml wasted in sink and witnessed by Shanda Bumps, Charity fundraiser. Will cont to monitor closely.

## 2015-01-31 NOTE — Progress Notes (Signed)
PULMONARY / CRITICAL CARE MEDICINE   Name: Jonathon Galvan MRN: [id number] DOB: [birth year]    ADMISSION DATE:  February 22, 2015 TODAY'S DATE: 01/31/2015  REFERRING MD :  EDP  CHIEF COMPLAINT:  Cardiac arrest   INITIAL PRESENTATION: 69 yo M with known history of HF, CAD and an ICD, found by EMS on 8/6 in PEA after he called them concerned with new dyspnea. They had ROSC after 12 minutes of CPR. Intubated in ED and admitted to PCCM.  BRIEF PATIENT DESCRIPTOR: 69 year old male w/ known history of nonischemic CM, systolic HF, CAD, ICD placement and left heart cath (2015), DM T2, tobacco abuse, Hep C, HTN, LBBB, and cocaine abuse. On 8/6 he called EMS emergently complaining of dyspnea. At time of EMS arrival was found in PEA. HR in 58s, ACLS initiated. Received 4 rounds of Epinephrine, 1 amp D 50, and 300 amio when HR went into wide complex rhythm. ROSC estimated at least 12 minutes. Arrived to ER unresponsive w/ ambu-bag assisted ventilation, w/ HR 50s Sinus brady. [employer] tech reports slow AF prior to arrest. His AICD is set at 200, it did not shock the pt. Patient had positive UDP for cocaine. He had a prior cardiac arrest associated with cocaine use. He was intubated by EDP. PCCM asked to admit. Patient was started on hypothermia and supportive care.  STUDIES:  8/7 Head CT > small vessel disease, no acute findings 8/7 EEG > abnormal due to lack of electro-cerebral activity (performed on sedation, while cool) 8/9 Echo > LVEF 20-25% 8/9 EEG > burst suppression indicative of severe encephalopathy, likely anoxic. Poor prognosis. (performed rewarmed)  SIGNIFICANT EVENTS: 8/6 Cardiac arrest 8/6 intubated and admitted to [facility] 8/7 rewarmed, sedation back on for vent synchrony 8/9 EEG showing poor prognosis, family notified, they are discussing withdrawal of care   LINES/DRAINS: L IJ CVL 8/7 >>> R Radial Arterial Line 8/7 >>>  SUBJECTIVE: Nurses reported perioral edema seen last night while bathing  the patient, said it flared up when they rolled him over, but it subsided after Elink was called.   No other acute events reported. No family at bedside this morning.  VITAL SIGNS: Temp:  [98.4 F (36.9 C)-98.8 F (37.1 C)] 98.4 F (36.9 C) (08/10 0800) Pulse Rate:  [57-102] 80 (08/10 0802) Resp:  [10-19] 18 (08/10 0802) BP: (112-184)/(60-108) 171/79 mmHg (08/10 0600) SpO2:  [95 %-100 %] 96 % (08/10 0802) Arterial Line BP: (103-304)/(57-203) 304/203 mmHg (08/10 0445) FiO2 (%):  [30 %] 30 % (08/10 0802) Weight:  [195 lb 12.3 oz (88.8 kg)] 195 lb 12.3 oz (88.8 kg) (08/10 0600) HEMODYNAMICS: CVP:  [3 mmHg-17 mmHg] 5 mmHg VENTILATOR SETTINGS: Vent Mode:  [-] PRVC FiO2 (%):  [30 %] 30 % Set Rate:  [18 bmp] 18 bmp Vt Set:  [620 mL] 620 mL PEEP:  [5 cmH20] 5 cmH20 Plateau Pressure:  [19 cmH20-22 cmH20] 20 cmH20 INTAKE / OUTPUT:  Intake/Output Summary (Last 24 hours) at 01/31/15 0903 Last data filed at 01/31/15 0800  Gross per 24 hour  Intake 1272.97 ml  Output   1600 ml  Net -327.03 ml   PHYSICAL EXAMINATION: Gen: Unresponsive on vent HENT: NCAT, PERRL (sluggish) PULM: Rhonchi bilaterally CV: RRR, no mgr, Nl S1/S2 AB:  BS+, soft, NT, ND, pads on. MSK: s/p L AKA, normal bulk bilat Neuro: No eye opening, no response to voice. No gag, no corneal and no dolls eye, sluggish pupils.  LABS:  CBC  Recent Labs Lab 01/29/15 0455  01/30/15 0415 01/31/15 0412  WBC 14.6* 15.3* 11.4*  HGB 11.9* 12.1* 11.8*  HCT 35.2* 37.0* 35.7*  PLT 172 164 169   Coag's  Recent Labs Lab 02-01-15 2258 01/28/15 0433 01/28/15 0830 01/28/15 0846  APTT  --  31 55*  --   INR 1.29  --   --  1.41   BMET  Recent Labs Lab 01/30/15 0415 01/30/15 1610 01/31/15 0412  NA 143 143 145  K 3.7 3.6 3.7  CL 112* 109 113*  CO2 21* 21* 22  BUN 22* 25* 27*  CREATININE 3.49* 3.99* 4.09*  GLUCOSE 106* 108* 135*   Electrolytes  Recent Labs Lab 01/28/15 0140 01/28/15 0532  01/30/15 0415  01/30/15 1610 01/31/15 0412  CALCIUM  --  7.7*  < > 7.3* 8.1* 8.2*  MG 2.0 1.8  --  1.6*  --   --   PHOS 3.5 1.1*  --  3.4  --   --   < > = values in this interval not displayed. Sepsis Markers  Recent Labs Lab 01-Feb-2015 2301 01/28/15 0140 01/29/15 0455 01/29/15 1025 01/29/15 1259 01/30/15 0415  LATICACIDVEN 5.77*  --   --  1.4 1.3  --   PROCALCITON  --  2.21 9.00  --   --  8.23   ABG  Recent Labs Lab 2015/02/01 2327 01/28/15 0031 01/30/15 0402  PHART 7.248* 7.353 7.402  PCO2ART 53.1* 46.4* 33.1*  PO2ART 100.0 120.0* 73.8*   Liver Enzymes  Recent Labs Lab 2015-02-01 2258 01/28/15 0532  AST 34 30  ALT 15* 17  ALKPHOS 57 58  BILITOT 1.1 1.8*  ALBUMIN 3.0* 2.8*   Cardiac Enzymes  Recent Labs Lab 02/01/15 2258  TROPONINI 0.10*   Glucose  Recent Labs Lab 01/30/15 1137 01/30/15 1620 01/30/15 1959 01/30/15 2349 01/31/15 0349 01/31/15 0814  GLUCAP 76 120* 100* 101* 123* 108*   Imaging  8/10 CXR >> interim slight clearing of b/l pulm edema and pleural effusions  ASSESSMENT / PLAN:  PULMONARY OETT 8/6 >>> A: Acute hypoxic respiratory failure s/p cardiopulmonary arrest  Pulmonary infiltrates, R >L  - likely edema but cannot r/o aspiration - improving P:   Full vent support until final family decision on withdrawal of care vs trach/peg. D/C all sedation. Diuresing, will hold further for now. CXR and ABG in am  See ID  CARDIOVASCULAR CVL R IJ 8/7 >>> Art line R radial 8/7 >>> A: PEA arrest with known NICM and cocaine abuse Hx of CAD NICM w/ EF 20-255% w/ AICD  Hx of PEA due to cocaine abuse Hx HTN Possible paroxysmal afib, NSR now Pulmonary edema P:  Cards following F/u Echo showed decreased LVEF, previously 30-35, now 20-25% On heparin drip for afib and ACS precautions, monitoring CBC Continue ASA, home Lipitor D/C lasix. Art line not reading appropriately - D/C  RENAL A:  Stage 3 CKD Acute on chronic kidney injury (Cr 4.09 and  worsening. Baseline 1.8) Hyperchloremia P: BMP q12 hrs Replace K as needed Renal dose meds Hold IVF, hold further diureses. Replace electrolytes as indicated.  GASTROINTESTINAL A:   No acute issues P:   PPI qd Continue tube feeding  HEMATOLOGIC A:   Leukocytosis - resolving DVT prevention P:  Follow CBC  Heparin, no evidence of bleeding SCDs  INFECTIOUS A:   Cannot r/o aspiration PNA Elevated PCT, normal lactate P:   BCx2 8/7>>> Sputum 8/7 - moderate WBC, few gram neg and gram positive rods  Abx: Unasyn start day  8/7, day 4/x  ENDOCRINE A:   DM type 2 w/ hyperglycemia - well controlled P:   SSI, TF coverage  NEUROLOGIC A:   Acute encephalopathy > withdrawing to pain, movements not clearly purposeful  Likely severe anoxic injury per EEG results (repeated EEG once normothermic 8/9) Depression Cocaine abuse P:   RASS goal: 0 D/C Nimbex, Versed and fentanyl Maintain normothermia 72 hours D/C home Elavil  FAMILY  - Updates: updated two daughter and a sister bedside, after discussion, will make patient a full DNR and the family is to discuss trach/peg vs withdrawal and will let us know.  - Inter-disciplinary family meet or Palliative Care meeting due by: 8/14  The patient is critically ill with multiple organ systems failure and requires high complexity decision making for assessment and support, frequent evaluation and titration of therapies, application of advanced monitoring technologies and extensive interpretation of multiple databases.   Critical Care Time devoted to patient care services described in this note is  35  Minutes. This time reflects time of care of this signee Dr Koren Bound. This critical care time does not reflect procedure time, or teaching time or supervisory time of PA/NP/Med student/Med Resident etc but could involve care discussion time.  Alyson Reedy, M.D. [facility] Pulmonary/Critical Care Medicine. Pager: [phone]. After hours  pager: [phone].

## 2015-01-31 NOTE — Progress Notes (Signed)
ANTICOAGULATION CONSULT NOTE - Follow Up Consult  Pharmacy Consult for heparin Indication: atrial fibrillation  No Known Allergies  Patient Measurements: Height: 6' (182.9 cm) Weight: 200 lb 6.4 oz (90.9 kg) (with pads) IBW/kg (Calculated) : 77.6 Heparin Dosing Weight: 77.6  Vital Signs: Temp: 98.4 F (36.9 C) (08/10 0400) Temp Source: Core (Comment) (08/10 0400) BP: 145/69 mmHg (08/10 0400) Pulse Rate: 91 (08/10 0400)  Labs:  Recent Labs  01/28/15 0830 01/28/15 0846  01/29/15 0455  01/30/15 0415 01/30/15 1310 01/30/15 1610 01/31/15 0412 01/31/15 0413  HGB  --   --   < > 11.9*  --  12.1*  --   --  11.8*  --   HCT  --   --   < > 35.2*  --  37.0*  --   --  35.7*  --   PLT  --   --   --  172  --  164  --   --  169  --   APTT 55*  --   --   --   --   --   --   --   --   --   LABPROT  --  17.3*  --   --   --   --   --   --   --   --   INR  --  1.41  --   --   --   --   --   --   --   --   HEPARINUNFRC  --   --   < >  --   < > 0.42 0.38  --   --  0.26*  CREATININE  --   --   < > 2.75*  < > 3.49*  --  3.99* 4.09*  --   < > = values in this interval not displayed.  Estimated Creatinine Clearance: 18.4 mL/min (by C-G formula based on Cr of 4.09).   Assessment: 69 yo man admitted admitted February 19, 2015 s/p PEA arrest with ROSC, rewarmed s/p arctic sun protocol.   Pharmacy consulted to dose heparin.  PMH: CHF, CAD, HLD, asthma, PVD, DM, hep c, HTN, LBBB, anemia, thrombocytopenia, cardiomyopathy, prior cardiac arrest w/ cocaine use  Anticoagulation: hep gtt s/p cardiac arrest, Afib Heparin level slightly subtherapeutic (0.26) on 1400 units/hr. No bleeding noted or issues with line per RN.   Goal of Therapy:  Heparin level 0.3-0.7 units/ml Monitor platelets by anticoagulation protocol: Yes   Plan:  Increase heparin to 1550 units/hr F/u 8 hr heparin level  Christoper Fabian, PharmD, BCPS Clinical pharmacist, pager [phone] 01/31/2015,5:48 AM

## 2015-01-31 NOTE — Progress Notes (Signed)
eLink Physician-Brief Progress Note Patient Name: Jonathon Galvan DOB: [birth year] MRN: [id number]   Date of Service  01/31/2015  HPI/Events of Note  Called and notified that patient developed acute facial swelling involving lips and upper neck when he was turned on side for bath . Tongue appears to be spared. Unclear whether this is an allergic rxn. Only med that he has received is unasyn at 3:00. ? Whether this was positional as the swelling appears to be quickly subsiding. Will follow, defer steroids or H2 blockade for now but consider if suspicion for allergic rxn increases.   eICU Interventions       Intervention Category Intermediate Interventions: Other:  Jonathon Galvan S. 01/31/2015, 4:51 AM

## 2015-01-31 NOTE — Consult Note (Signed)
NEURO HOSPITALIST CONSULT NOTE   Referring physician: McQuaid   Galvan for Consult: prognostication  HPI:                                                                                                                                          Jonathon Galvan is an 69 y.o. male w/ known CM called EMS emergently on 8/6 w/ dyspnea. At time of EMS arrival was found in PEA. HR in 58s, ACLS initiated. Received 4 rounds of Epinephrine, 1 amp D 50, and 300 amio when HR went into wide complex rhythm. ROSC estimated at least 12 minutes. Arrived to ER unresponsive w/ ambu-bag assisted ventilation, w/ HR 50s Sinus brady. He was intubated by EDP. Patient was started on hypothermic protocol --Patietn was rewarmed 01/29/15 at 1600. Neurology was asked to evaluate for prognostication.   Rewarmed 01/29/2015 --42 hours ago---of sedation since 07:30 01/30/2015 BUN-- 27 Cr-- 4.09 AST--30 ALT--27  CT head on 8/7 showed no acute infarct or edema  EEG on 01-30-15 Impression: this is an abnormal EEG performed in the ICU setting in a patient that already completed rewarming. 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 Of note, post anoxic burst suppression pattern usually confers a poor prognosis. Clinical correlation is advised.   Past Medical History  Diagnosis Date  . CHF (congestive heart failure)     EF 30-35%, nonischemic  . CAD (coronary artery disease)   . Hyperlipidemia     hypertriglyceridemia on niaspan and lipitor.  . Asthma   . Allergy   . PVD (peripheral vascular disease)     s/p left AKA @ [facility] (MRSA septic arthritis)  . Diabetes mellitus     dx in 2009, DKA, came unresponsive  . Tobacco abuse   . Hepatitis C   . Hypertension   . LBBB (left bundle branch block)   . Anemia of chronic disease     baseline Hgb now 13  . Thrombocytopenia      07/2007, went down to 90,000. likely 2/2 neurontin.  . Cocaine use     s/p cardiopulmonary arrest 12/25/2002 ([facility])  . Cholelithiasis     documented on CT abdomen in 07/2007  . Non-ischemic cardiomyopathy 04/2014    BiV-ICD placed    Past Surgical History  Procedure Laterality Date  . Left leg mrsa infection s/p aka    . Left shoulder cyst removal    . Biv-icd (crt-d)  05/19/14    Medtronic Quattro  . Left heart catheterization with coronary angiogram N/A 05/04/2014    Procedure: LEFT HEART CATHETERIZATION WITH CORONARY ANGIOGRAM;  Surgeon: Micheline Chapman, MD;  Location: [facility] CATH LAB;  Service: Cardiovascular;  Laterality: N/A;  . Bi-ventricular implantable cardioverter defibrillator N/A 05/19/2014    Procedure: BI-VENTRICULAR IMPLANTABLE CARDIOVERTER DEFIBRILLATOR  (CRT-D);  Surgeon: Duke Salvia, MD;  Location: [facility] CATH LAB;  Service: Cardiovascular;  Laterality: N/A;    Family History  Problem Relation Age of Onset  . Coronary artery disease Mother     died in her 26's  . Cirrhosis Father     died from complications  . Coronary artery disease Brother     died of fatal MI     Social History:  reports that he has been smoking.  He does not have any smokeless tobacco history on file. He reports that he does not drink alcohol or use illicit drugs.  No Known Allergies  MEDICATIONS:                                                                                                                     Prior to Admission:  Prescriptions prior to admission  Medication Sig Dispense Refill Last Dose  . amLODipine (NORVASC) 10 MG tablet Take 10 mg by mouth daily.   02/15/2015 at Unknown time  . aspirin EC 81 MG tablet Take 1 tablet (81 mg total) by mouth daily. 30 tablet 0 02/20/2015 at Unknown time  . atorvastatin (LIPITOR) 10 MG tablet Take 1 tablet (10 mg total) by mouth daily. 30 tablet 5 01/22/2015 at Unknown time  . carvedilol (COREG) 12.5 MG tablet Take 2 tablets (25 mg total) by  mouth 2 (two) times daily with a meal. 30 tablet 0 02/17/2015 at 0930  . cetirizine (ZYRTEC) 10 MG tablet Take 1 tablet (10 mg total) by mouth daily. 30 tablet 5 01/25/2015 at Unknown time  . enalapril (VASOTEC) 2.5 MG tablet Take 1 tablet (2.5 mg total) by mouth daily. 30 tablet 0 01/25/2015 at Unknown time  . furosemide (LASIX) 80 MG tablet Take 0.5 tablets (40 mg total) by mouth 2 (two) times daily. 60 tablet 11 01/29/2015 at Unknown time  . gabapentin (NEURONTIN) 300 MG capsule Take 300 mg by mouth 3 (three) times daily.   02/04/2015 at Unknown time  . glipiZIDE (GLUCOTROL) 10 MG tablet Take 1 tablet by mouth daily before breakfast.    02/16/2015 at Unknown time  . isosorbide-hydrALAZINE (BIDIL) 20-37.5 MG per tablet Take 2 tablets by mouth 2 (two) times daily. 120 tablet 0 01/25/2015 at Unknown time  . metFORMIN (GLUCOPHAGE) 1000 MG tablet Take 1 tablet (1,000 mg total) by mouth 2 (two) times daily with a meal. 60 tablet 11 02/17/2015 at Unknown time  . amitriptyline (ELAVIL) 100 MG tablet Take 100 mg by mouth at bedtime.   01/26/2015  . niacin (NIASPAN) 500 MG CR tablet Take 500 mg by mouth at bedtime.    01/26/2015  . oxyCODONE-acetaminophen (PERCOCET) 10-325 MG per tablet Take 1 tablet by mouth every 12 (twelve) hours as needed for pain. 30 tablet 0   . venlafaxine (EFFEXOR) 50 MG tablet Take 1 tablet (50 mg total)  by mouth at bedtime. (Patient not taking: Reported on 01/28/2015) 30 tablet 5 Not Taking at Unknown time  . zolpidem (AMBIEN) 10 MG tablet Take 10 mg by mouth at bedtime as needed for sleep.   unk   Scheduled: . ampicillin-sulbactam (UNASYN) IV  3 g Intravenous Q12H  . antiseptic oral rinse  7 mL Mouth Rinse Q2H  . aspirin  81 mg Per Tube Daily  . chlorhexidine gluconate  15 mL Mouth Rinse BID  . Chlorhexidine Gluconate Cloth  6 each Topical Q0600  . heparin subcutaneous  5,000 Units Subcutaneous 3 times per day  . insulin aspart  0-15 Units Subcutaneous 6 times per day  . pantoprazole sodium  40  mg Per Tube Q1200     ROS:                                                                                                                                       History obtained from unobtainable from patient due to mental status   Blood pressure 151/66, pulse 68, temperature 98.4 F (36.9 C), temperature source Core (Comment), resp. rate 12, height 6' (1.829 m), weight 88.8 kg (195 lb 12.3 oz), SpO2 95 %.   Neurologic Examination:                                                                                                      HEENT-  Normocephalic, no lesions, without obvious abnormality.  Normal external eye and conjunctiva.  Normal TM's bilaterally.  Normal auditory canals and external ears. Normal external nose, mucus membranes and septum.  Normal pharynx. Cardiovascular- S1, S2 normal, pulses palpable throughout   Lungs- chest clear, no wheezing, rales, normal symmetric air entry Abdomen- normal findings: bowel sounds normal Extremities- no edema Lymph-no adenopathy palpable Musculoskeletal-no joint tenderness, deformity or swelling Skin-warm and dry, no hyperpigmentation, vitiligo, or suspicious lesions  Neurological Examination Mental Status: Patient does not respond to verbal stimuli.  Does not respond to deep sternal rub.  Does not follow commands.  No verbalizations noted. Intubated and not breathing over vent. On no sedation.  Cranial Nerves: II: patient does not respond confrontation bilaterally, pupils right 2 mm, left 2 mm,and reactive bilaterally III,IV,VI: doll's response present bilaterally. V,VII: corneal reflex present bilaterally  VIII: patient does not respond to verbal stimuli IX,X: gag reflex absent, XI: trapezius strength unable to test bilaterally XII: tongue strength unable to test Motor: Extremities flaccid throughout.  No spontaneous movement noted.  No purposeful movements  noted. Sensory: Does not respond to noxious stimuli in any extremity. Deep  Tendon Reflexes:  Absent throughout. Plantars: absent on the right --left is a AKA Cerebellar: Unable to perform      Lab Results: Basic Metabolic Panel:  Recent Labs Lab 01/28/15 0140  01/28/15 0532  01/29/15 0455 01/29/15 4098 01/30/15 0415 01/30/15 1610 01/31/15 0412  NA  --   < > 139  < > 143 142 143 143 145  K  --   < > 3.2*  < > 3.4* 3.8 3.7 3.6 3.7  CL  --   < > 111  < > 111 110 112* 109 113*  CO2  --   --  18*  < > 21* 21* 21* 21* 22  GLUCOSE  --   < > 205*  < > 98 100* 106* 108* 135*  BUN  --   < > 13  < > 19 19 22* 25* 27*  CREATININE  --   < > 2.16*  < > 2.75* 2.83* 3.49* 3.99* 4.09*  CALCIUM  --   --  7.7*  < > 8.1* 7.9* 7.3* 8.1* 8.2*  MG 2.0  --  1.8  --   --   --  1.6*  --   --   PHOS 3.5  --  1.1*  --   --   --  3.4  --   --   < > = values in this interval not displayed.  Liver Function Tests:  Recent Labs Lab 2015-02-12 2258 01/28/15 0532  AST 34 30  ALT 15* 17  ALKPHOS 57 58  BILITOT 1.1 1.8*  PROT 6.4* 5.9*  ALBUMIN 3.0* 2.8*   No results for input(s): LIPASE, AMYLASE in the last 168 hours. No results for input(s): AMMONIA in the last 168 hours.  CBC:  Recent Labs Lab 02/12/15 2258  01/28/15 0532  01/28/15 0911 01/28/15 1554 01/29/15 0455 01/30/15 0415 01/31/15 0412  WBC 21.6*  --  16.7*  --   --   --  14.6* 15.3* 11.4*  NEUTROABS 13.8*  --   --   --   --   --   --   --  6.9  HGB 12.8*  < > 12.5*  < > 13.6 13.9 11.9* 12.1* 11.8*  HCT 39.9  < > 38.1*  < > 40.0 41.0 35.2* 37.0* 35.7*  MCV 83.0  --  80.2  --   --   --  78.4 79.7 81.0  PLT 210  --  182  --   --   --  172 164 169  < > = values in this interval not displayed.  Cardiac Enzymes:  Recent Labs Lab 2015/02/12 2258  TROPONINI 0.10*    Lipid Panel: No results for input(s): CHOL, TRIG, HDL, CHOLHDL, VLDL, LDLCALC in the last 168 hours.  CBG:  Recent Labs Lab 01/30/15 1620 01/30/15 1959 01/30/15 2349 01/31/15 0349 01/31/15 0814  GLUCAP 120* 100* 101* 123* 108*     Microbiology: Results for orders placed or performed during the hospital encounter of 02-12-2015  Culture, blood (routine x 2)     Status: None (Preliminary result)   Collection Time: 2015/02/12 11:23 PM  Result Value Ref Range Status   Specimen Description BLOOD LEFT ANTECUBITAL  Final   Special Requests BOTTLES DRAWN AEROBIC ONLY 5CC  Final   Culture NO GROWTH 2 DAYS  Final   Report Status PENDING  Incomplete  Culture, blood (routine x 2)  Status: None (Preliminary result)   Collection Time: 02/03/2015 11:27 PM  Result Value Ref Range Status   Specimen Description BLOOD LEFT HAND  Final   Special Requests BOTTLES DRAWN AEROBIC ONLY 8CC  Final   Culture NO GROWTH 2 DAYS  Final   Report Status PENDING  Incomplete  Urine culture     Status: None   Collection Time: 01/28/15 12:00 AM  Result Value Ref Range Status   Specimen Description URINE, CLEAN CATCH  Final   Special Requests NONE  Final   Culture NO GROWTH 1 DAY  Final   Report Status 01/29/2015 FINAL  Final  MRSA PCR Screening     Status: Abnormal   Collection Time: 01/28/15  1:32 AM  Result Value Ref Range Status   MRSA by PCR POSITIVE (A) NEGATIVE Final    Comment:        The GeneXpert MRSA Assay (FDA approved for NASAL specimens only), is one component of a comprehensive MRSA colonization surveillance program. It is not intended to diagnose MRSA infection nor to guide or monitor treatment for MRSA infections. RESULT CALLED TO, READ BACK BY AND VERIFIED WITH: C.BUNGQUE,RN [id number] 01/28/15 M.CAMPBELL   Culture, respiratory (NON-Expectorated)     Status: None   Collection Time: 01/28/15  3:12 AM  Result Value Ref Range Status   Specimen Description TRACHEAL ASPIRATE  Final   Special Requests Normal  Final   Gram Stain   Final    MODERATE WBC PRESENT, PREDOMINANTLY PMN NO SQUAMOUS EPITHELIAL CELLS SEEN FEW GRAM NEGATIVE RODS FEW GRAM POSITIVE RODS Performed at [facility]    Culture   Final     Non-Pathogenic Oropharyngeal-type Flora Isolated. Performed at [facility]    Report Status 01/30/2015 FINAL  Final    Coagulation Studies: No results for input(s): LABPROT, INR in the last 72 hours.  Imaging: Dg Chest Port 1 View  01/31/2015   CLINICAL DATA:  Respiratory failure.  EXAM: PORTABLE CHEST - 1 VIEW  COMPARISON:  01/30/2015.  FINDINGS: Endotracheal tube, NG tube, left IJ line in stable position. Cardiac pacer with lead tips in right atrium right ventricle. Cardiomegaly. Interim slight clearing of bilateral pulmonary edema and pleural effusions. No pneumothorax.  IMPRESSION: 1. Lines and tubes in stable position. 2. Interim slight clearing of bilateral pulmonary edema and pleural effusions.   Electronically Signed   By: Maisie Fus  Register   On: 01/31/2015 07:33   Dg Chest Port 1 View  01/30/2015   CLINICAL DATA:  69 year old male with shortness breath. Subsequent encounter.  EXAM: PORTABLE CHEST - 1 VIEW  COMPARISON:  01/28/2015.  FINDINGS: Endotracheal tube tip 2 cm above the carina.  Nasogastric tube courses below the diaphragm. Tip is not included on the present exam.  AICD/biventricular pacer in place with leads unchanged in position. Left internal jugular catheter tip mid superior vena cava level. No gross pneumothorax.  Cardiomegaly.  Pulmonary edema.  Consolidation lung bases greater on the right may represent pleural fluid with atelectasis although limited for evaluating for underlying infiltrate or mass. Appearance is similar to prior exam.  Calcified aorta.  IMPRESSION: No significant change in appearance of pulmonary edema and right-sided pleural effusion.  Limited evaluation of lung bases particularly on the right given the presence of pleural effusion.  Cardiomegaly with AICD/ biventricular pacer in place.  Endotracheal tube tip 2 cm above the carina.   Electronically Signed   By: Lacy Duverney M.D.   On: 01/30/2015 07:00  Assessment and plan per attending  neurologist  Felicie Morn PA-C [facility] [phone]  01/31/2015, 10:20 AM   Assessment/Plan:  69 YO man with a history of diabetes mellitus, peripheral vascular disease, coronary artery disease, CHF, stroke and cocaine abuse, 4 days status post cardiac arrest with severe diffuse anoxic brain injury, as a result. He has remained unresponsive, including after discontinuing sedating medications for more than 24 hours. EEG study following rewarming on 01/30/2015 showed findings consistent with severe anoxic diffuse brain injury. Prognosis is very poor for recovery to a meaningful functional state, if he were to survive.  No further neurodiagnostic studies are indicated at this point. Recommend no changes in current management, with aggressive treatment only to extent of family's wishes.  This patient is critically ill and at significant risk of neurological worsening or death, and care requires constant monitoring of vital signs, hemodynamics,respiratory and cardiac monitoring, neurological assessment, discussion with family, other specialists and medical decision making of high complexity. Total critical care time was 55 minutes.   I personally participated in this patient's evaluation and management, including clinical examination, as well as relating the above clinical impression and management recommendations.  Venetia Maxon M.D. [facility] [phone]

## 2015-02-01 ENCOUNTER — Inpatient Hospital Stay (HOSPITAL_COMMUNITY): Payer: Medicare Other

## 2015-02-01 DIAGNOSIS — I10 Essential (primary) hypertension: Secondary | ICD-10-CM

## 2015-02-01 LAB — BASIC METABOLIC PANEL
Anion gap: 10 (ref 5–15)
BUN: 31 mg/dL — ABNORMAL HIGH (ref 6–20)
CO2: 24 mmol/L (ref 22–32)
Calcium: 8.4 mg/dL — ABNORMAL LOW (ref 8.9–10.3)
Chloride: 112 mmol/L — ABNORMAL HIGH (ref 101–111)
Creatinine, Ser: 4 mg/dL — ABNORMAL HIGH (ref 0.61–1.24)
GFR, EST AFRICAN AMERICAN: 16 mL/min — AB (ref >60)
GFR, EST NON AFRICAN AMERICAN: 14 mL/min — AB (ref >60)
Glucose, Bld: 131 mg/dL — ABNORMAL HIGH (ref 65–99)
POTASSIUM: 3.6 mmol/L (ref 3.5–5.1)
Sodium: 146 mmol/L — ABNORMAL HIGH (ref 135–145)

## 2015-02-01 LAB — BLOOD GAS, ARTERIAL
ACID-BASE DEFICIT: 2.1 mmol/L — AB (ref 0.0–2.0)
Bicarbonate: 22.9 mEq/L (ref 20.0–24.0)
Drawn by: 31814
FIO2: 0.3
MECHVT: 620 mL
O2 SAT: 97.8 %
PCO2 ART: 44.5 mmHg (ref 35.0–45.0)
PEEP: 5 cmH2O
PO2 ART: 118 mmHg — AB (ref 80.0–100.0)
Patient temperature: 98.6
RATE: 18 resp/min
TCO2: 24.3 mmol/L (ref 0–100)
pH, Arterial: 7.332 — ABNORMAL LOW (ref 7.350–7.450)

## 2015-02-01 LAB — CBC
HEMATOCRIT: 35.9 % — AB (ref 39.0–52.0)
Hemoglobin: 11.4 g/dL — ABNORMAL LOW (ref 13.0–17.0)
MCH: 26 pg (ref 26.0–34.0)
MCHC: 31.8 g/dL (ref 30.0–36.0)
MCV: 82 fL (ref 78.0–100.0)
Platelets: 171 10*3/uL (ref 150–400)
RBC: 4.38 MIL/uL (ref 4.22–5.81)
RDW: 16.2 % — AB (ref 11.5–15.5)
WBC: 10.2 10*3/uL (ref 4.0–10.5)

## 2015-02-01 LAB — GLUCOSE, CAPILLARY
Glucose-Capillary: 129 mg/dL — ABNORMAL HIGH (ref 65–99)
Glucose-Capillary: 128 mg/dL — ABNORMAL HIGH (ref 65–99)
Glucose-Capillary: 123 mg/dL — ABNORMAL HIGH (ref 65–99)
Glucose-Capillary: 117 mg/dL — ABNORMAL HIGH (ref 65–99)
Glucose-Capillary: 124 mg/dL — ABNORMAL HIGH (ref 65–99)

## 2015-02-01 LAB — PHOSPHORUS: Phosphorus: 5.1 mg/dL — ABNORMAL HIGH (ref 2.5–4.6)

## 2015-02-01 LAB — MAGNESIUM: Magnesium: 2.2 mg/dL (ref 1.7–2.4)

## 2015-02-01 MED ORDER — CARVEDILOL 3.125 MG PO TABS
3.1250 mg | ORAL_TABLET | Freq: Two times a day (BID) | ORAL | Status: DC
  Administered 2015-02-01 – 2015-02-02 (×3): 3.125 mg via ORAL
  Filled 2015-02-01 (×6): qty 1

## 2015-02-01 MED ORDER — AMLODIPINE 1 MG/ML ORAL SUSPENSION
5.0000 mg | Freq: Every day | ORAL | Status: DC
  Administered 2015-02-01: 5 mg
  Filled 2015-02-01 (×3): qty 5

## 2015-02-01 MED ORDER — AMLODIPINE 1 MG/ML ORAL SUSPENSION: 10.0000 mg | Freq: Every day | ORAL | Status: DC

## 2015-02-01 NOTE — Care Management Important Message (Signed)
Important Message ° °Patient Details  °Name: Jonathon Galvan °MRN: [id number] °Date of Birth: 05/27/1946 ° ° °Medicare Important Message Given:    ° ° ° °Andra M Robinson °02/01/2015, 1:44 PM °

## 2015-02-01 NOTE — Progress Notes (Signed)
CVP line clotted off. Per PA Storm Frisk, okay to leave CVP line off. Will reassess in the morning. Will continue to monitor.

## 2015-02-01 NOTE — Progress Notes (Signed)
Subjective: Patient remains unresponsive, intubated and on sedating medications. No new overnight adverse events reported.  Objective: Current vital signs: BP 151/67 mmHg  Pulse 73  Temp(Src) 98.7 F (37.1 C) (Oral)  Resp 18  Ht 6' (1.829 m)  Wt 89.6 kg (197 lb 8.5 oz)  BMI 26.78 kg/m2  SpO2 97%  Neurologic Exam: Patient was unresponsive to external stimuli, including noxious stimuli. Right pupil was normal in size and reacted normally to light. Left pupil was irregular in shape and reactivity right was unclear. Extraocular movements were intact with oculocephalic maneuvers, but were minimal. No facial weakness was noted. Muscle tone was flaccid throughout. Patient had no spontaneous movements nor abnormal posturing. Right knee jerk was absent. Right plantar response was mute  Medications: I have reviewed the patient's current medications.  Assessment/Plan: 69 year old man 5 days post cardiac arrest with clinical findings consistent with severe diffuse irreversible anoxic brain injury. Prognosis for recovery is extremely poor.  Recommend no changes in current management, including aggressive therapy only to the family's wishes.  Neurology will sign off on his patient's care at this point, but will remain available for reevaluation or to address any questions patient's family may have.  C.R. Roseanne Reno, MD [facility] [phone]  02/01/2015  8:32 AM

## 2015-02-01 NOTE — Progress Notes (Addendum)
PULMONARY / CRITICAL CARE MEDICINE   Name: Jonathon Galvan MRN: [id number] DOB: 1946/05/26    ADMISSION DATE:  February 10, 2015 TODAY'S DATE: 01/31/2015  REFERRING MD :  EDP  CHIEF COMPLAINT:  Cardiac arrest   INITIAL PRESENTATION: 69 yo M with known history of HF, CAD and an ICD, found by EMS on 8/6 in [facility] after he called them concerned with new dyspnea. They had ROSC after 12 minutes of CPR. Intubated in ED and admitted to PCCM.  BRIEF PATIENT DESCRIPTOR: 69 year old male w/ known history of nonischemic CM, systolic HF, CAD, ICD placement and left heart cath (2015), DM T2, tobacco abuse, Hep C, HTN, LBBB, and cocaine abuse. On 8/6 he called EMS emergently complaining of dyspnea. At time of EMS arrival was found in [facility]. HR in 58s, ACLS initiated. Received 4 rounds of Epinephrine, 1 amp D 50, and 300 amio when HR went into wide complex rhythm. ROSC estimated at least 12 minutes. Arrived to ER unresponsive w/ ambu-bag assisted ventilation, w/ HR 50s Sinus brady. [employer] tech reports slow AF prior to arrest. His AICD is set at 200, it did not shock the pt. Patient had positive UDP for cocaine. He had a prior cardiac arrest associated with cocaine use. He was intubated by EDP. PCCM asked to admit. Patient was started on hypothermia and supportive care.  STUDIES:  8/7 Head CT > small vessel disease, no acute findings 8/7 EEG > abnormal due to lack of electro-cerebral activity (performed on sedation, while cool) 8/9 Echo > LVEF 20-25% 8/9 EEG > burst suppression indicative of severe encephalopathy, likely anoxic. Poor prognosis. (performed rewarmed)  SIGNIFICANT EVENTS: 8/6 Cardiac arrest 8/6 intubated and admitted to [facility] 8/7 rewarmed, sedation back on for vent synchrony 8/9 EEG showing poor prognosis, family notified, they are discussing withdrawal of care   LINES/DRAINS: L IJ CVL 8/7 >>> R Radial Arterial Line 8/7 >>>  SUBJECTIVE: Remains completely unresponsive off sedation.  VITAL  SIGNS: Temp:  [97.9 F (36.6 C)-98.7 F (37.1 C)] 98.7 F (37.1 C) (08/11 0400) Pulse Rate:  [68-83] 77 (08/11 0410) Resp:  [8-26] 18 (08/11 0410) BP: (146-203)/(57-96) 203/76 mmHg (08/11 0410) SpO2:  [94 %-100 %] 97 % (08/11 0410) FiO2 (%):  [30 %] 30 % (08/11 0410) Weight:  [88.8 kg (195 lb 12.3 oz)] 88.8 kg (195 lb 12.3 oz) (08/10 0600) HEMODYNAMICS: CVP:  [2 mmHg-6 mmHg] 2 mmHg VENTILATOR SETTINGS: Vent Mode:  [-] PRVC FiO2 (%):  [30 %] 30 % Set Rate:  [18 bmp] 18 bmp Vt Set:  [620 mL] 620 mL PEEP:  [5 cmH20] 5 cmH20 Plateau Pressure:  [19 cmH20-22 cmH20] 20 cmH20 INTAKE / OUTPUT:  Intake/Output Summary (Last 24 hours) at 02/01/15 0534 Last data filed at 02/01/15 0400  Gross per 24 hour  Intake 1966.05 ml  Output    875 ml  Net 1091.05 ml   PHYSICAL EXAMINATION: Gen: Unresponsive on vent off sedation. HENT: NCAT, PERRL (sluggish). PULM: Rhonchi bilaterally. CV: RRR, no mgr, Nl S1/S2. AB:  BS+, soft, NT, ND, pads on. MSK: s/p L AKA, normal bulk bilat. Neuro: No eye opening, no response to voice. No gag, no corneal and no dolls eye, sluggish pupils.  LABS:  CBC  Recent Labs Lab 01/30/15 0415 01/31/15 0412 02/01/15 0330  WBC 15.3* 11.4* 10.2  HGB 12.1* 11.8* 11.4*  HCT 37.0* 35.7* 35.9*  PLT 164 169 171   Coag's  Recent Labs Lab Feb 10, 2015 2258 01/28/15 0433 01/28/15 0830 01/28/15 4098  APTT  --  31 55*  --   INR 1.29  --   --  1.41   BMET  Recent Labs Lab 01/30/15 1610 01/31/15 0412 02/01/15 0330  NA 143 145 146*  K 3.6 3.7 3.6  CL 109 113* 112*  CO2 21* 22 24  BUN 25* 27* 31*  CREATININE 3.99* 4.09* 4.00*  GLUCOSE 108* 135* 131*   Electrolytes  Recent Labs Lab 01/28/15 0532  01/30/15 0415 01/30/15 1610 01/31/15 0412 02/01/15 0330  CALCIUM 7.7*  < > 7.3* 8.1* 8.2* 8.4*  MG 1.8  --  1.6*  --   --  2.2  PHOS 1.1*  --  3.4  --   --  5.1*  < > = values in this interval not displayed. Sepsis Markers  Recent Labs Lab  02/04/2015 2301 01/28/15 0140 01/29/15 0455 01/29/15 1025 01/29/15 1259 01/30/15 0415  LATICACIDVEN 5.77*  --   --  1.4 1.3  --   PROCALCITON  --  2.21 9.00  --   --  8.23   ABG  Recent Labs Lab 01/28/15 0031 01/30/15 0402 02/01/15 0500  PHART 7.353 7.402 7.332*  PCO2ART 46.4* 33.1* 44.5  PO2ART 120.0* 73.8* 118*   Liver Enzymes  Recent Labs Lab 01/30/2015 2258 01/28/15 0532  AST 34 30  ALT 15* 17  ALKPHOS 57 58  BILITOT 1.1 1.8*  ALBUMIN 3.0* 2.8*   Cardiac Enzymes  Recent Labs Lab 02/08/2015 2258  TROPONINI 0.10*   Glucose  Recent Labs Lab 01/31/15 0814 01/31/15 1235 01/31/15 1557 01/31/15 1954 01/31/15 2328 02/01/15 0335  GLUCAP 108* 118* 119* 104* 118* 129*   Imaging  8/10 CXR >> interim slight clearing of b/l pulm edema and pleural effusions  ASSESSMENT / PLAN:  PULMONARY OETT 8/6 >>> A: Acute hypoxic respiratory failure s/p cardiopulmonary arrest  Pulmonary infiltrates, R >L  - likely edema but cannot r/o aspiration - improving P:   Continue full vent support, terminal extubation on Saturday. D/Ced all sedation. Diuresing, will hold further for now. CXR and ABG in am  See ID  CARDIOVASCULAR CVL R IJ 8/7 >>> Art line R radial 8/7 >>> A: PEA arrest with known NICM and cocaine abuse Hx of CAD NICM w/ EF 20-255% w/ AICD  Hx of PEA due to cocaine abuse Hx HTN Possible paroxysmal afib, NSR now Pulmonary edema P:  Cards following F/u Echo showed decreased LVEF, previously 30-35, now 20-25% D/C heparin drip. Continue ASA, home Lipitor D/C lasix. No further art line Norvasc 10 mg PO, SBP this AM is 150.  RENAL A:  Stage 3 CKD Acute on chronic kidney injury (Cr 4.09 and worsening. Baseline 1.8) Hyperchloremia P: BMET in AM. Replace electrolytes as indicated. Renal dose meds. Hold IVF, hold further diureses. Replace electrolytes as indicated.  GASTROINTESTINAL A:   No acute issues P:   PPI QD. Continue tube  feeding.  HEMATOLOGIC A:   Leukocytosis - resolving DVT prevention P:  Follow CBC. D/C heparin since withdrawing on Saturday. SCDs  INFECTIOUS A:   Cannot r/o aspiration PNA Elevated PCT, normal lactate P:   BCx2 8/7>>> Sputum 8/7 - moderate WBC, few gram neg and gram positive rods  Abx: Unasyn start day 8/7, day 5/x.  ENDOCRINE A:   DM type 2 w/ hyperglycemia - well controlled P:   SSI, TF coverage  NEUROLOGIC A:   Acute encephalopathy > withdrawing to pain, movements not clearly purposeful  Likely severe anoxic injury per EEG results (repeated EEG once  normothermic 8/9) Depression Cocaine abuse P:   RASS goal: 0 D/C Nimbex, Versed and fentanyl Maintain normothermia 72 hours, complete. D/C home Elavil  FAMILY  - Updates: No family bedside today but per conversation with RN staff on 8/10, they wish to wait til Saturday for arrival of additional family members then begin morphine and withdraw.  - Inter-disciplinary family meet or Palliative Care meeting due by: 8/14  The patient is critically ill with multiple organ systems failure and requires high complexity decision making for assessment and support, frequent evaluation and titration of therapies, application of advanced monitoring technologies and extensive interpretation of multiple databases.   Critical Care Time devoted to patient care services described in this note is  35  Minutes. This time reflects time of care of this signee Dr Koren Bound. This critical care time does not reflect procedure time, or teaching time or supervisory time of PA/NP/Med student/Med Resident etc but could involve care discussion time.  Alyson Reedy, M.D. [facility] Pulmonary/Critical Care Medicine. Pager: [phone]. After hours pager: [phone].

## 2015-02-01 NOTE — Care Management Important Message (Signed)
Important Message  Patient Details  Name: Jonathon Galvan MRN: [id number] Date of Birth: [birth year]   Medicare Important Message Given:       [name] 02/01/2015, 1:44 PM

## 2015-02-01 NOTE — Progress Notes (Addendum)
Pt blood pressure in the 200s notified [facility]. No new orders at this time. Will continue to monitor.

## 2015-02-01 NOTE — Progress Notes (Signed)
Pt's vent continuously popping off ET tube. Notified respiratory who tried to place a smaller connecting piece to make the connection tighter but were unsuccessful. Notified [facility] who sent PA Storm Frisk to look at the pt. Raul pushed the connector piece into the ET tube farther and reapplied tape. Connection is holding for now. Will continue to monitor.

## 2015-02-02 ENCOUNTER — Inpatient Hospital Stay (HOSPITAL_COMMUNITY): Payer: Medicare Other

## 2015-02-02 LAB — BLOOD GAS, ARTERIAL
ACID-BASE DEFICIT: 1.6 mmol/L (ref 0.0–2.0)
Bicarbonate: 22.1 mEq/L (ref 20.0–24.0)
Drawn by: 440666
FIO2: 0.3
O2 Saturation: 95.6 %
PATIENT TEMPERATURE: 98.6
PEEP/CPAP: 5 cmH2O
RATE: 18 resp/min
TCO2: 23.2 mmol/L (ref 0–100)
VT: 620 mL
pCO2 arterial: 34.2 mmHg — ABNORMAL LOW (ref 35.0–45.0)
pH, Arterial: 7.427 (ref 7.350–7.450)
pO2, Arterial: 74.6 mmHg — ABNORMAL LOW (ref 80.0–100.0)

## 2015-02-02 LAB — CBC
HCT: 35.6 % — ABNORMAL LOW (ref 39.0–52.0)
HEMOGLOBIN: 11.4 g/dL — AB (ref 13.0–17.0)
MCH: 26.1 pg (ref 26.0–34.0)
MCHC: 32 g/dL (ref 30.0–36.0)
MCV: 81.7 fL (ref 78.0–100.0)
Platelets: 190 10*3/uL (ref 150–400)
RBC: 4.36 MIL/uL (ref 4.22–5.81)
RDW: 16.2 % — ABNORMAL HIGH (ref 11.5–15.5)
WBC: 12.7 10*3/uL — ABNORMAL HIGH (ref 4.0–10.5)

## 2015-02-02 LAB — MAGNESIUM: Magnesium: 2.4 mg/dL (ref 1.7–2.4)

## 2015-02-02 LAB — BASIC METABOLIC PANEL
Anion gap: 11 (ref 5–15)
BUN: 35 mg/dL — AB (ref 6–20)
CO2: 22 mmol/L (ref 22–32)
Calcium: 8.4 mg/dL — ABNORMAL LOW (ref 8.9–10.3)
Chloride: 113 mmol/L — ABNORMAL HIGH (ref 101–111)
Creatinine, Ser: 3.63 mg/dL — ABNORMAL HIGH (ref 0.61–1.24)
GFR, EST AFRICAN AMERICAN: 18 mL/min — AB (ref >60)
GFR, EST NON AFRICAN AMERICAN: 16 mL/min — AB (ref >60)
Glucose, Bld: 128 mg/dL — ABNORMAL HIGH (ref 65–99)
Potassium: 4.2 mmol/L (ref 3.5–5.1)
SODIUM: 146 mmol/L — AB (ref 135–145)

## 2015-02-02 LAB — CULTURE, BLOOD (ROUTINE X 2)
Culture: NO GROWTH
Culture: NO GROWTH

## 2015-02-02 LAB — GLUCOSE, CAPILLARY
GLUCOSE-CAPILLARY: 111 mg/dL — AB (ref 65–99)
GLUCOSE-CAPILLARY: 128 mg/dL — AB (ref 65–99)
Glucose-Capillary: 112 mg/dL — ABNORMAL HIGH (ref 65–99)

## 2015-02-02 LAB — PHOSPHORUS: PHOSPHORUS: 5.7 mg/dL — AB (ref 2.5–4.6)

## 2015-02-02 NOTE — Progress Notes (Signed)
PULMONARY / CRITICAL CARE MEDICINE   Name: Jonathon Galvan MRN: [id number] DOB: 08/08/1945    ADMISSION DATE:  2015-02-12 TODAY'S DATE: 01/31/2015  REFERRING MD :  EDP  CHIEF COMPLAINT:  Cardiac arrest   INITIAL PRESENTATION: 69 yo M with known history of HF, CAD and an ICD, found by EMS on 8/6 in [facility] after he called them concerned with new dyspnea. They had ROSC after 12 minutes of CPR. Intubated in ED and admitted to PCCM.  BRIEF PATIENT DESCRIPTOR: 69 year old male w/ known history of nonischemic CM, systolic HF, CAD, ICD placement and left heart cath (2015), DM T2, tobacco abuse, Hep C, HTN, LBBB, and cocaine abuse. On 8/6 he called EMS emergently complaining of dyspnea. At time of EMS arrival was found in [facility]. HR in 58s, ACLS initiated. Received 4 rounds of Epinephrine, 1 amp D 50, and 300 amio when HR went into wide complex rhythm. ROSC estimated at least 12 minutes. Arrived to ER unresponsive w/ ambu-bag assisted ventilation, w/ HR 50s Sinus brady. [employer] tech reports slow AF prior to arrest. His AICD is set at 200, it did not shock the pt. Patient had positive UDP for cocaine. He had a prior cardiac arrest associated with cocaine use. He was intubated by EDP. PCCM asked to admit. Patient was started on hypothermia and supportive care.  STUDIES:  8/7 Head CT > small vessel disease, no acute findings 8/7 EEG > abnormal due to lack of electro-cerebral activity (performed on sedation, while cool) 8/9 Echo > LVEF 20-25% 8/9 EEG > burst suppression indicative of severe encephalopathy, likely anoxic. Poor prognosis. (performed rewarmed)  SIGNIFICANT EVENTS: 8/6 Cardiac arrest 8/6 [facility] 8/7 rewarmed, sedation back on for vent synchrony 8/9 EEG showing poor prognosis, family notified, they are discussing withdrawal of care   LINES/DRAINS: L IJ CVL 8/7 >>> R Radial Arterial Line 8/7 >>>  SUBJECTIVE: Remains completely unresponsive off sedation.  VITAL  SIGNS: Temp:  [98 F (36.7 C)-99.5 F (37.5 C)] 99.1 F (37.3 C) (08/12 0800) Pulse Rate:  [70-91] 79 (08/12 0826) Resp:  [13-21] 18 (08/12 0826) BP: (142-173)/(58-85) 165/69 mmHg (08/12 0826) SpO2:  [94 %-100 %] 97 % (08/12 0826) FiO2 (%):  [30 %] 30 % (08/12 0826) Weight:  [85.7 kg (188 lb 15 oz)] 85.7 kg (188 lb 15 oz) (08/12 0430) HEMODYNAMICS:   VENTILATOR SETTINGS: Vent Mode:  [-] PRVC FiO2 (%):  [30 %] 30 % Set Rate:  [18 bmp] 18 bmp Vt Set:  [620 mL] 620 mL PEEP:  [5 cmH20] 5 cmH20 Plateau Pressure:  [15 cmH20-23 cmH20] 19 cmH20 INTAKE / OUTPUT:  Intake/Output Summary (Last 24 hours) at 02/02/15 0838 Last data filed at 02/02/15 0800  Gross per 24 hour  Intake   1720 ml  Output   1175 ml  Net    545 ml   PHYSICAL EXAMINATION: Gen: Unresponsive on vent off sedation. HENT: NCAT, PERRL (sluggish). PULM: Rhonchi bilaterally. CV: RRR, no mgr, Nl S1/S2. AB:  BS+, soft, NT, ND, pads on. MSK: s/p L AKA, normal bulk bilat. Neuro: No eye opening, no response to voice. No gag, no corneal and no dolls eye, sluggish pupils.  LABS:  CBC  Recent Labs Lab 01/31/15 0412 02/01/15 0330 02/02/15 0420  WBC 11.4* 10.2 12.7*  HGB 11.8* 11.4* 11.4*  HCT 35.7* 35.9* 35.6*  PLT 169 171 190   Coag's  Recent Labs Lab February 12, 2015 2258 01/28/15 0433 01/28/15 0830 01/28/15 0846  APTT  --  31 55*  --   INR 1.29  --   --  1.41   BMET  Recent Labs Lab 01/31/15 0412 02/01/15 0330 02/02/15 0420  NA 145 146* 146*  K 3.7 3.6 4.2  CL 113* 112* 113*  CO2 BUN 27* 31* 35*  CREATININE 4.09* 4.00* 3.63*  GLUCOSE 135* 131* 128*   Electrolytes  Recent Labs Lab 01/30/15 0415  01/31/15 0412 02/01/15 0330 02/02/15 0420  CALCIUM 7.3*  < > 8.2* 8.4* 8.4*  MG 1.6*  --   --  2.2 2.4  PHOS 3.4  --   --  5.1* 5.7*  < > = values in this interval not displayed. Sepsis Markers  Recent Labs Lab 02/07/2015 2301 01/28/15 0140 01/29/15 0455 01/29/15 1025 01/29/15 1259  01/30/15 0415  LATICACIDVEN 5.77*  --   --  1.4 1.3  --   PROCALCITON  --  2.21 9.00  --   --  8.23   ABG  Recent Labs Lab 01/30/15 0402 02/01/15 0500 02/02/15 0544  PHART 7.402 7.332* 7.427  PCO2ART 33.1* 44.5 34.2*  PO2ART 73.8* 118* 74.6*   Liver Enzymes  Recent Labs Lab 01/23/2015 2258 01/28/15 0532  AST 34 30  ALT 15* 17  ALKPHOS 57 58  BILITOT 1.1 1.8*  ALBUMIN 3.0* 2.8*   Cardiac Enzymes  Recent Labs Lab 02/05/2015 2258  TROPONINI 0.10*   Glucose  Recent Labs Lab 02/01/15 1239 02/01/15 1725 02/01/15 2033 02/02/15 0050 02/02/15 0433 02/02/15 0802  GLUCAP 123* 117* 124* 111* 128* 112*   Imaging  8/10 CXR >> interim slight clearing of b/l pulm edema and pleural effusions  ASSESSMENT / PLAN:  PULMONARY OETT 8/6 >>> A: Acute hypoxic respiratory failure s/p cardiopulmonary arrest  Pulmonary infiltrates, R >L  - likely edema but cannot r/o aspiration - improving P:   - Continue full vent support, terminal extubation in AM. - D/Ced all sedation. - D/C diureses. - Will not order CXR or ABG for AM as we plan on withdrawal.  CARDIOVASCULAR CVL R IJ 8/7 >>> Art line R radial 8/7 >>> A: PEA arrest with known NICM and cocaine abuse Hx of CAD NICM w/ EF 20-255% w/ AICD  Hx of PEA due to cocaine abuse Hx HTN Possible paroxysmal afib, NSR now Pulmonary edema P:  - Cards following - F/u Echo showed decreased LVEF, previously 30-35, now 20-25% - D/C heparin drip. - D/C ASA, home Lipitor - D/C lasix. - No further art line - Continue Norvasc 5 mg PO.  RENAL A:  Stage 3 CKD Acute on chronic kidney injury (Cr 4.09 and worsening. Baseline 1.8) Hyperchloremia P: - D/C further blood draws. - D/C IVF. - D/C lasix.  GASTROINTESTINAL A:   No acute issues P:   - D/C PPI QD. - D/C tube feeding.  HEMATOLOGIC A:   Leukocytosis - resolving DVT prevention P:  - D/C AM CBC. - D/C heparin since withdrawing on Saturday. - SCDs  INFECTIOUS A:    Cannot r/o aspiration PNA Elevated PCT, normal lactate P:   BCx2 8/7>>> Sputum 8/7 - moderate WBC, few gram neg and gram positive rods  Abx: - Unasyn start day 8/7, day 6/6, d/c today.  ENDOCRINE A:   DM type 2 w/ hyperglycemia - well controlled P:   - D/C CBGs.  NEUROLOGIC A:   Acute encephalopathy > withdrawing to pain, movements not clearly purposeful  Likely severe anoxic injury per EEG results (repeated EEG once normothermic 8/9) Depression  Cocaine abuse P:   - Morphine for comfort when family is ready for withdrawal.  FAMILY  - Updates: No family bedside today, plan for terminal extubation in AM per family's wishes.  - Inter-disciplinary family meet or Palliative Care meeting due by: 8/14.  The patient is critically ill with multiple organ systems failure and requires high complexity decision making for assessment and support, frequent evaluation and titration of therapies, application of advanced monitoring technologies and extensive interpretation of multiple databases.   Critical Care Time devoted to patient care services described in this note is  35  Minutes. This time reflects time of care of this signee Dr Koren Bound. This critical care time does not reflect procedure time, or teaching time or supervisory time of PA/NP/Med student/Med Resident etc but could involve care discussion time.  Alyson Reedy, M.D. [facility] Pulmonary/Critical Care Medicine. Pager: [phone]. After hours pager: [phone].

## 2015-02-03 MED ORDER — SODIUM CHLORIDE 0.9 % IJ SOLN
10.0000 mL | INTRAMUSCULAR | Status: DC | PRN
  Administered 2015-02-04 – 2015-02-05 (×2): 20 mL
  Administered 2015-02-05 – 2015-02-07 (×4): 10 mL
  Administered 2015-02-08: 20 mL
  Filled 2015-02-03 (×7): qty 40

## 2015-02-03 MED ORDER — MORPHINE BOLUS VIA INFUSION
5.0000 mg | INTRAVENOUS | Status: DC | PRN
  Administered 2015-02-04 – 2015-02-06 (×7): 10 mg via INTRAVENOUS
  Filled 2015-02-03 (×8): qty 20

## 2015-02-03 MED ORDER — DEXTROSE 5 % IV SOLN
2.0000 mg/h | INTRAVENOUS | Status: DC
  Administered 2015-02-03 – 2015-02-06 (×4): 10 mg/h via INTRAVENOUS
  Filled 2015-02-03 (×4): qty 10

## 2015-02-03 NOTE — Procedures (Signed)
Extubation Procedure Note  Patient Details:   Name: Jonathon Galvan DOB: 11/15/1945 MRN: [id number]   Airway Documentation:  Airway 7.5 mm (Active)  Secured at (cm) 21 cm 02/03/2015  7:20 AM  Measured From Lips 02/03/2015  7:20 AM  Secured Location Left 02/03/2015  7:20 AM  Secured By [employer] 02/03/2015  7:20 AM  Tube Holder Repositioned Yes 02/03/2015  7:20 AM  Cuff Pressure (cm H2O) 26 cm H2O 02/02/2015  8:26 PM  Site Condition Dry 02/03/2015  7:20 AM    Evaluation Pt extubated at this time per MD order, terminal extubation.  Family, RN & chaplin at bedside.   Cherylin Mylar 02/03/2015, 9:36 AM

## 2015-02-03 NOTE — Progress Notes (Signed)
PULMONARY / CRITICAL CARE MEDICINE   Name: Jonathon Galvan MRN: [id number] DOB: 05/18/1946    ADMISSION DATE:  01/29/2015 TODAY'S DATE: 01/31/2015  REFERRING MD :  EDP  CHIEF COMPLAINT:  Cardiac arrest   INITIAL PRESENTATION: 69 yo M with known history of HF, CAD and an ICD, found by EMS on 8/6 in [facility] after he called them concerned with new dyspnea. They had ROSC after 12 minutes of CPR. Intubated in ED and admitted to PCCM.  BRIEF PATIENT DESCRIPTOR: 69 year old male w/ known history of nonischemic CM, systolic HF, CAD, ICD placement and left heart cath (2015), DM T2, tobacco abuse, Hep C, HTN, LBBB, and cocaine abuse. On 8/6 he called EMS emergently complaining of dyspnea. At time of EMS arrival was found in [facility]. HR in 58s, ACLS initiated. Received 4 rounds of Epinephrine, 1 amp D 50, and 300 amio when HR went into wide complex rhythm. ROSC estimated at least 12 minutes. Arrived to ER unresponsive w/ ambu-bag assisted ventilation, w/ HR 50s Sinus brady. [employer] tech reports slow AF prior to arrest. His AICD is set at 200, it did not shock the pt. Patient had positive UDP for cocaine. He had a prior cardiac arrest associated with cocaine use. He was intubated by EDP. PCCM asked to admit. Patient was started on hypothermia and supportive care.  STUDIES:  8/7 Head CT > small vessel disease, no acute findings 8/7 EEG > abnormal due to lack of electro-cerebral activity (performed on sedation, while cool) 8/9 Echo > LVEF 20-25% 8/9 EEG > burst suppression indicative of severe encephalopathy, likely anoxic. Poor prognosis. (performed rewarmed)  SIGNIFICANT EVENTS: 8/6 Cardiac arrest 8/6 intubated and admitted to [facility] 8/7 rewarmed, sedation back on for vent synchrony 8/9 EEG showing poor prognosis, family notified, they are discussing withdrawal of care   LINES/DRAINS: L IJ CVL 8/7 >>> R Radial Arterial Line 8/7 >>>  SUBJECTIVE: Remains completely unresponsive off sedation.  VITAL  SIGNS: Temp:  [99.1 F (37.3 C)-100.6 F (38.1 C)] 100.6 F (38.1 C) (08/13 0819) Pulse Rate:  [75-94] 79 (08/13 0819) Resp:  [18-27] 19 (08/13 0819) BP: (148-209)/(62-123) 182/84 mmHg (08/13 0819) SpO2:  [92 %-98 %] 95 % (08/13 0819) FiO2 (%):  [30 %] 30 % (08/13 0819) HEMODYNAMICS:   VENTILATOR SETTINGS: Vent Mode:  [-] PRVC FiO2 (%):  [30 %] 30 % Set Rate:  [18 bmp] 18 bmp Vt Set:  [620 mL] 620 mL PEEP:  [5 cmH20] 5 cmH20 Plateau Pressure:  [17 cmH20-19 cmH20] 18 cmH20 INTAKE / OUTPUT:  Intake/Output Summary (Last 24 hours) at 02/03/15 7829 Last data filed at 02/03/15 0500  Gross per 24 hour  Intake    220 ml  Output   1150 ml  Net   -930 ml   PHYSICAL EXAMINATION: Gen: Unresponsive on vent off sedation. HENT: NCAT, PERRL (sluggish). PULM: Rhonchi bilaterally. CV: RRR, no mgr, Nl S1/S2. AB:  BS+, soft, NT, ND, pads on. MSK: s/p L AKA, normal bulk bilat. Neuro: No eye opening, no response to voice. No gag, no corneal and no dolls eye, sluggish pupils.  LABS:  CBC  Recent Labs Lab 01/31/15 0412 02/01/15 0330 02/02/15 0420  WBC 11.4* 10.2 12.7*  HGB 11.8* 11.4* 11.4*  HCT 35.7* 35.9* 35.6*  PLT 169 171 190   Coag's  Recent Labs Lab 02/04/2015 2258 01/28/15 0433 01/28/15 0830 01/28/15 0846  APTT  --  31 55*  --   INR 1.29  --   --  1.41   BMET  Recent Labs Lab 01/31/15 0412 02/01/15 0330 02/02/15 0420  NA 145 146* 146*  K 3.7 3.6 4.2  CL 113* 112* 113*  CO2 22 24 22   BUN 27* 31* 35*  CREATININE 4.09* 4.00* 3.63*  GLUCOSE 135* 131* 128*   Electrolytes  Recent Labs Lab 01/30/15 0415  01/31/15 0412 02/01/15 0330 02/02/15 0420  CALCIUM 7.3*  < > 8.2* 8.4* 8.4*  MG 1.6*  --   --  2.2 2.4  PHOS 3.4  --   --  5.1* 5.7*  < > = values in this interval not displayed. Sepsis Markers  Recent Labs Lab 02/02/2015 2301 01/28/15 0140 01/29/15 0455 01/29/15 1025 01/29/15 1259 01/30/15 0415  LATICACIDVEN 5.77*  --   --  1.4 1.3  --    PROCALCITON  --  2.21 9.00  --   --  8.23   ABG  Recent Labs Lab 01/30/15 0402 02/01/15 0500 02/02/15 0544  PHART 7.402 7.332* 7.427  PCO2ART 33.1* 44.5 34.2*  PO2ART 73.8* 118* 74.6*   Liver Enzymes  Recent Labs Lab 02/19/2015 2258 01/28/15 0532  AST 34 30  ALT 15* 17  ALKPHOS 57 58  BILITOT 1.1 1.8*  ALBUMIN 3.0* 2.8*   Cardiac Enzymes  Recent Labs Lab 01/26/2015 2258  TROPONINI 0.10*   Glucose  Recent Labs Lab 02/01/15 1239 02/01/15 1725 02/01/15 2033 02/02/15 0050 02/02/15 0433 02/02/15 0802  GLUCAP 123* 117* 124* 111* 128* 112*   Imaging  8/10 CXR >> interim slight clearing of b/l pulm edema and pleural effusions  ASSESSMENT / PLAN:  PULMONARY OETT 8/6 >>> A: Acute hypoxic respiratory failure s/p cardiopulmonary arrest  Pulmonary infiltrates, R >L  - likely edema but cannot r/o aspiration - improving P:   - Terminal extubation today after starting morphine.  CARDIOVASCULAR CVL R IJ 8/7 >>> Art line R radial 8/7 >>> A: PEA arrest with known NICM and cocaine abuse Hx of CAD NICM w/ EF 20-255% w/ AICD  Hx of PEA due to cocaine abuse Hx HTN Possible paroxysmal afib, NSR now Pulmonary edema P:  - D/C all medications. - D/C tele monitoring.  RENAL A:  Stage 3 CKD Acute on chronic kidney injury (Cr 4.09 and worsening. Baseline 1.8) Hyperchloremia P: - D/C IVF. - No further blood draws.  GASTROINTESTINAL A:   No acute issues P:   - D/C PPI QD. - D/C tube feeding.  HEMATOLOGIC A:   Leukocytosis - resolving DVT prevention P:  - D/C AM CBC. - D/C heparin.  INFECTIOUS A:   Cannot r/o aspiration PNA Elevated PCT, normal lactate P:   BCx2 8/7>>> Sputum 8/7 - moderate WBC, few gram neg and gram positive rods  Abx: - D/C abx.  ENDOCRINE A:   DM type 2 w/ hyperglycemia - well controlled P:   - D/C CBGs.  NEUROLOGIC A:   Acute encephalopathy > withdrawing to pain, movements not clearly purposeful  Likely severe  anoxic injury per EEG results (repeated EEG once normothermic 8/9) Depression Cocaine abuse P:   - Morphine for comfort then withdrawal.  FAMILY  - Updates: Spoke with family extensively today, they are ready for extubation, will start morphine then terminally extubate to comfort.  The patient is critically ill with multiple organ systems failure and requires high complexity decision making for assessment and support, frequent evaluation and titration of therapies, application of advanced monitoring technologies and extensive interpretation of multiple databases.   Critical Care Time devoted to patient  care services described in this note is  35  Minutes. This time reflects time of care of this signee Dr Jennet Maduro. This critical care time does not reflect procedure time, or teaching time or supervisory time of PA/NP/Med student/Med Resident etc but could involve care discussion time.  Rush Farmer, M.D. [facility] Pulmonary/Critical Care Medicine. Pager: [phone]. After hours pager: [phone].

## 2015-02-03 NOTE — Progress Notes (Signed)
Chaplain responded to call for family support following decision to extubate.  Provided prayer, spiritual and emotional support to various family members.  Two daughters, nieces, and other relatives are present.  One son is not present and family does not know where he is (history of family conflict).  Family was appreciative of prayer and was made aware of grief support resources ([employer], [facility]).     02/03/15 0906  Clinical Encounter Type  Visited With Patient and family together  Visit Type (Withdrawal of life support)  Referral From Nurse  Spiritual Encounters  Spiritual Needs Prayer;Ritual;Grief support  Stress Factors  Family Stress Factors Major life changes

## 2015-02-04 MED ORDER — LORAZEPAM 2 MG/ML IJ SOLN
INTRAMUSCULAR | Status: AC
  Filled 2015-02-04: qty 1

## 2015-02-04 MED ORDER — ATROPINE SULFATE 1 % OP SOLN
2.0000 [drp] | Freq: Four times a day (QID) | OPHTHALMIC | Status: DC
  Administered 2015-02-04 – 2015-02-06 (×8): 2 [drp] via SUBLINGUAL
  Filled 2015-02-04: qty 5
  Filled 2015-02-04 (×2): qty 2

## 2015-02-04 MED ORDER — LORAZEPAM 2 MG/ML IJ SOLN
2.0000 mg | INTRAMUSCULAR | Status: DC | PRN
  Administered 2015-02-04 (×2): 2 mg via INTRAVENOUS
  Administered 2015-02-05: 1 mg via INTRAVENOUS
  Administered 2015-02-05 – 2015-02-06 (×4): 2 mg via INTRAVENOUS
  Filled 2015-02-04 (×6): qty 1

## 2015-02-04 NOTE — Progress Notes (Signed)
PULMONARY / CRITICAL CARE MEDICINE   Name: Jonathon Galvan MRN: [id number] DOB: 1945/07/31    ADMISSION DATE:  02/16/2015 TODAY'S DATE: 01/31/2015  REFERRING MD :  EDP  CHIEF COMPLAINT:  Cardiac arrest   INITIAL PRESENTATION: 69 yo M with known history of HF, CAD and an ICD, found by EMS on 8/6 in [facility]A after he called them concerned with new dyspnea. They had ROSC after 12 minutes of CPR. Intubated in ED and admitted to PCCM.  BRIEF PATIENT DESCRIPTOR: 69 year old male w/ known history of nonischemic CM, systolic HF, CAD, ICD placement and left heart cath (2015), DM T2, tobacco abuse, Hep C, HTN, LBBB, and cocaine abuse. On 8/6 he called EMS emergently complaining of dyspnea. At time of EMS arrival was found in PEA. HR in 58s, ACLS initiated. Received 4 rounds of Epinephrine, 1 amp D 50, and 300 amio when HR went into wide complex rhythm. ROSC estimated at least 12 minutes. Arrived to ER unresponsive w/ ambu-bag assisted ventilation, w/ HR 50s Sinus brady. [employer] tech reports slow AF prior to arrest. His AICD is set at 200, it did not shock the pt. Patient had positive UDP for cocaine. He had a prior cardiac arrest associated with cocaine use. He was intubated by EDP. PCCM asked to admit. Patient was started on hypothermia and supportive care.  STUDIES:  8/7 Head CT > small vessel disease, no acute findings 8/7 EEG > abnormal due to lack of electro-cerebral activity (performed on sedation, while cool) 8/9 Echo > LVEF 20-25% 8/9 EEG > burst suppression indicative of severe encephalopathy, likely anoxic. Poor prognosis. (performed rewarmed)  SIGNIFICANT EVENTS: 8/6 Cardiac arrest 8/6 intubated and admitted to [facility] 8/7 rewarmed, sedation back on for vent synchrony 8/9 EEG showing poor prognosis, family notified, they are discussing withdrawal of care   LINES/DRAINS: L IJ CVL 8/7 >>> R Radial Arterial Line 8/7 > out  SUBJECTIVE:  Remains completely unresponsive on MS drip/ breathing  appears a bit erratic but no distress.  VITAL SIGNS: Temp:  [100.4 F (38 C)-100.8 F (38.2 C)] 100.4 F (38 C) (08/13 1300) Pulse Rate:  [82-99] 95 (08/13 1410) Resp:  [19-28] 21 (08/13 1410) BP: (187-188)/(84-99) 188/99 mmHg (08/13 1410) SpO2:  [82 %-95 %] 82 % (08/13 1410) HEMODYNAMICS:   VENTILATOR SETTINGS:   INTAKE / OUTPUT:  Intake/Output Summary (Last 24 hours) at 02/04/15 0848 Last data filed at 02/04/15 0824  Gross per 24 hour  Intake  40.67 ml  Output   2225 ml  Net -2184.33 ml   PHYSICAL EXAMINATION: Gen: Unresponsive on vent off sedation. HENT: NCAT, PERRL (sluggish). PULM: Rhonchi bilaterally. CV: RRR, no mgr, Nl S1/S2. AB:  BS+, soft, NT, ND, pads on. MSK: s/p L AKA, normal bulk bilat. Neuro: No eye opening, no response to voice. No gag, no corneal and no dolls eye, sluggish pupils.  LABS:  CBC  Recent Labs Lab 01/31/15 0412 02/01/15 0330 02/02/15 0420  WBC 11.4* 10.2 12.7*  HGB 11.8* 11.4* 11.4*  HCT 35.7* 35.9* 35.6*  PLT 169 171 190   Coag's No results for input(s): APTT, INR in the last 168 hours. BMET  Recent Labs Lab 01/31/15 0412 02/01/15 0330 02/02/15 0420  NA 145 146* 146*  K 3.7 3.6 4.2  CL 113* 112* 113*  CO2 22 24 22   BUN 27* 31* 35*  CREATININE 4.09* 4.00* 3.63*  GLUCOSE 135* 131* 128*   Electrolytes  Recent Labs Lab 01/30/15 0415  01/31/15 0412 02/01/15  0330 02/02/15 0420  CALCIUM 7.3*  < > 8.2* 8.4* 8.4*  MG 1.6*  --   --  2.2 2.4  PHOS 3.4  --   --  5.1* 5.7*  < > = values in this interval not displayed. Sepsis Markers  Recent Labs Lab 01/29/15 0455 01/29/15 1025 01/29/15 1259 01/30/15 0415  LATICACIDVEN  --  1.4 1.3  --   PROCALCITON 9.00  --   --  8.23   ABG  Recent Labs Lab 01/30/15 0402 02/01/15 0500 02/02/15 0544  PHART 7.402 7.332* 7.427  PCO2ART 33.1* 44.5 34.2*  PO2ART 73.8* 118* 74.6*   Liver Enzymes No results for input(s): AST, ALT, ALKPHOS, BILITOT, ALBUMIN in the last 168  hours. Cardiac Enzymes No results for input(s): TROPONINI, PROBNP in the last 168 hours. Glucose  Recent Labs Lab 02/01/15 1239 02/01/15 1725 02/01/15 2033 02/02/15 0050 02/02/15 0433 02/02/15 0802  GLUCAP 123* 117* 124* 111* 128* 112*   Imaging    ASSESSMENT / PLAN:  PULMONARY OETT 8/6 > 8/13 A: Acute hypoxic respiratory failure s/p cardiopulmonary arrest  Pulmonary infiltrates, R >L  - likely edema but cannot r/o aspiration - improving P:   Terminal care on ms drip    CARDIOVASCULAR CVL R IJ 8/7 >>> Art line R radial 8/7 > out A: PEA arrest with known NICM and cocaine abuse Hx of CAD NICM w/ EF 20-255% w/ AICD  Hx of PEA due to cocaine abuse Hx HTN Possible paroxysmal afib, NSR now Pulmonary edema P:  Terminal care  RENAL A:  Stage 3 CKD Acute on chronic kidney injury (Cr 4.09 and worsening. Baseline 1.8) Hyperchloremia P: - D/Cd IVF. - No further blood draws.  GASTROINTESTINAL A:   No acute issues P:   - D/Cd PPI  - D/Cd tube feeding.  HEMATOLOGIC A:   Leukocytosis - resolving DVT prevention P:  - D/C d AM CBC. - D/C d heparin.  INFECTIOUS A:   Cannot r/o aspiration PNA Elevated PCT, normal lactate P:   BCx2 8/7> neg Sputum 8/7 - moderate WBC, few gram neg and gram positive rods> neg   Abx: - D/Cd abx.  ENDOCRINE A:   DM type 2 w/ hyperglycemia - well controlled P:   - D/Cd CBGs.  NEUROLOGIC A:   Acute encephalopathy > withdrawing to pain, movements not clearly purposeful  Likely severe anoxic injury per EEG results (repeated EEG once normothermic 8/9) Depression Cocaine abuse P:   - Morphine for comfort    FAMILY  - none in room or floor   Comfort goals met on MS drip/ continue palliative rx only   Christinia Gully, MD Pulmonary and [place] [phone] After 5:30 PM or weekends, call [phone]

## 2015-02-05 NOTE — Care Management Important Message (Signed)
Important Message  Patient Details  Name: Jonathon Galvan MRN: [id number] Date of Birth: Nov 01, 1945   Medicare Important Message Given:  Yes-second notification given    Glennon Mac, RN 02/05/2015, 3:29 PM

## 2015-02-05 NOTE — Progress Notes (Signed)
Nutrition Brief Note  Chart reviewed. Pt now transitioning to comfort care.  No further nutrition interventions warranted at this time.  Please re-consult as needed.   Brittain Smithey A. Clothilde Tippetts, RD, LDN, CDE Pager: [phone] After hours Pager: [phone]  

## 2015-02-05 NOTE — Progress Notes (Addendum)
PULMONARY / CRITICAL CARE MEDICINE   Name: Jonathon Galvan MRN: [id number] DOB: 1946-06-23    ADMISSION DATE:  02/07/2015 TODAY'S DATE: 01/31/2015  REFERRING MD :  EDP  CHIEF COMPLAINT:  Cardiac arrest   Brief Patient Description : 69 yo M with known history of HF, CAD and an ICD, found by EMS on 8/6 in [facility] after he called them concerned with new dyspnea. They had ROSC after 12 minutes of CPR. Intubated in ED and admitted to PCCM. Underwent hypothermia, however, after rewarming was found to have suffered severe encephalopathy, likely anoxic in setting of arrest. Poor prognosis. He was terminally extubated 8/13 and transitioned to comfort care.   STUDIES:  8/7 Head CT > small vessel disease, no acute findings 8/7 EEG > abnormal due to lack of electro-cerebral activity (performed on sedation, while cool) 8/9 Echo > LVEF 20-25% 8/9 EEG > burst suppression indicative of severe encephalopathy, likely anoxic. Poor prognosis. (performed rewarmed)  SIGNIFICANT EVENTS: 8/6 Cardiac arrest 8/6 intubated and admitted to [facility] 8/7 rewarmed, sedation back on for vent synchrony 8/9 EEG showing poor prognosis, family notified, they are discussing withdrawal of care  8/13 one way extubation > comfort  LINES/DRAINS: L IJ CVL 8/7 >>> R Radial Arterial Line 8/7 > out  SUBJECTIVE:  Unresponsive, no family at bedside.  VITAL SIGNS: Temp:  [102.9 F (39.4 C)] 102.9 F (39.4 C) (08/14 1400) SpO2:  [62 %] 62 % (08/14 1400)   HEMODYNAMICS:   VENTILATOR SETTINGS:   INTAKE / OUTPUT:  Intake/Output Summary (Last 24 hours) at 02/05/15 1102 Last data filed at 02/05/15 0908  Gross per 24 hour  Intake  173.5 ml  Output   1300 ml  Net -1126.5 ml   PHYSICAL EXAMINATION: Gen: Unresponsive on morphine gtt HENT: NCAT, PERRL (sluggish). PULM: Rhonchi bilaterally. CV: RRR, no mgr, Nl S1/S2. AB:  BS+, soft, NT, ND, pads on. MSK: s/p L AKA, normal bulk bilat. Neuro: No eye opening, no response to  voice. Occasional myoclonus  LABS:  CBC  Recent Labs Lab 01/31/15 0412 02/01/15 0330 02/02/15 0420  WBC 11.4* 10.2 12.7*  HGB 11.8* 11.4* 11.4*  HCT 35.7* 35.9* 35.6*  PLT 169 171 190   Coag's No results for input(s): APTT, INR in the last 168 hours. BMET  Recent Labs Lab 01/31/15 0412 02/01/15 0330 02/02/15 0420  NA 145 146* 146*  K 3.7 3.6 4.2  CL 113* 112* 113*  CO2 22 24 22   BUN 27* 31* 35*  CREATININE 4.09* 4.00* 3.63*  GLUCOSE 135* 131* 128*   Electrolytes  Recent Labs Lab 01/30/15 0415  01/31/15 0412 02/01/15 0330 02/02/15 0420  CALCIUM 7.3*  < > 8.2* 8.4* 8.4*  MG 1.6*  --   --  2.2 2.4  PHOS 3.4  --   --  5.1* 5.7*  < > = values in this interval not displayed. Sepsis Markers  Recent Labs Lab 01/29/15 1259 01/30/15 0415  LATICACIDVEN 1.3  --   PROCALCITON  --  8.23   ABG  Recent Labs Lab 01/30/15 0402 02/01/15 0500 02/02/15 0544  PHART 7.402 7.332* 7.427  PCO2ART 33.1* 44.5 34.2*  PO2ART 73.8* 118* 74.6*   Liver Enzymes No results for input(s): AST, ALT, ALKPHOS, BILITOT, ALBUMIN in the last 168 hours. Cardiac Enzymes No results for input(s): TROPONINI, PROBNP in the last 168 hours. Glucose  Recent Labs Lab 02/01/15 1239 02/01/15 1725 02/01/15 2033 02/02/15 0050 02/02/15 0433 02/02/15 0802  GLUCAP 123* 117* 124* 111* 128*  112*   Imaging    ASSESSMENT / PLAN:  Cardiac arrest (PEA) Anoxic brain injury Acute respiratory failure NICM Cocaine abuse CKD DM2  Dr. Molli Knock had extensive discussion with family 8/13 addressing poor prognosis and goals of care. At that time it was determined that they would pursue terminal wean with extubation to comfort care. Currently, with exception of O2 sat (87% on room air), his vital signs are stable.   Plan: - Morphine infusion for titrate for comfort or dyspnea - Sublingual atropine - No further blood draws - Possibly a candidate for residential hospice facility. Will involve CSW.    [name], AGACNP-BC [place] [facility] Pager [phone] or [phone]  02/05/2015 11:15 AM  Attending Note:  I have examined patient, reviewed labs, studies and notes. I have discussed the case with Henreitta Leber, and I agree with the data and plans as amended above.   Levy Pupa, MD, PhD 02/05/2015, 11:49 AM [place] [facility] [phone] or if no answer [phone]

## 2015-02-05 NOTE — Care Management Important Message (Deleted)
Important Message  Patient Details  Name: Jonathon Galvan MRN: [id number] Date of Birth: [birth year]   Medicare Important Message Given:   1st notification given    Glennon Mac, RN 02/05/2015, 3:23 PM

## 2015-02-05 NOTE — Progress Notes (Deleted)
Important Message  Patient Details  Name: PHAN NAZARYAN MRN: [id number] Date of Birth: 02/21/1946   Medicare Important Message Given:       Glennon Mac, RN 02/05/2015, 3:21 PM

## 2015-02-05 NOTE — Progress Notes (Signed)
Chaplain was referred by nurse during huddle. Chaplain  saw family at table in hall way and let them know Chaplain is available. Chaplain will follow up as needed.   02/05/15 1000  Clinical Encounter Type  Visited With Family  Visit Type Spiritual support  Referral From Nurse  Spiritual Encounters  Spiritual Needs Emotional

## 2015-02-06 ENCOUNTER — Encounter: Payer: Self-pay | Admitting: Internal Medicine

## 2015-02-06 DIAGNOSIS — R0600 Dyspnea, unspecified: Secondary | ICD-10-CM

## 2015-02-06 DIAGNOSIS — G253 Myoclonus: Secondary | ICD-10-CM

## 2015-02-06 DIAGNOSIS — Z515 Encounter for palliative care: Secondary | ICD-10-CM

## 2015-02-06 MED ORDER — GLYCOPYRROLATE 0.2 MG/ML IJ SOLN
0.4000 mg | Freq: Four times a day (QID) | INTRAMUSCULAR | Status: DC
  Administered 2015-02-06: 0.2 mg via INTRAVENOUS
  Administered 2015-02-06 – 2015-02-08 (×9): 0.4 mg via INTRAVENOUS
  Filled 2015-02-06 (×19): qty 2

## 2015-02-06 MED ORDER — ACETAMINOPHEN 650 MG RE SUPP: 650.0000 mg | Freq: Four times a day (QID) | RECTAL | Status: DC | PRN

## 2015-02-06 MED ORDER — LORAZEPAM 2 MG/ML IJ SOLN
1.0000 mg | INTRAMUSCULAR | Status: DC | PRN
  Administered 2015-02-06 – 2015-02-07 (×4): 2 mg via INTRAVENOUS
  Administered 2015-02-07: 4 mg via INTRAVENOUS
  Administered 2015-02-07 – 2015-02-09 (×8): 2 mg via INTRAVENOUS
  Filled 2015-02-06 (×12): qty 1
  Filled 2015-02-06: qty 2

## 2015-02-06 NOTE — Progress Notes (Signed)
Jerking movements have subsided at this time and pt appears to be resting comfortably.  Will continue to monitor. Sherald Barge

## 2015-02-06 NOTE — Progress Notes (Signed)
Will give another morphine bolus while awaiting for MD to call back.  Will also make day shift RN aware of situation. Jonathon Galvan

## 2015-02-06 NOTE — Progress Notes (Signed)
Chaplain responded to Pt actively dying request. Pt's sister was with Pt. And Chaplain prayed and faciltated conversation. Sister begin to talk top Pt. Chaplain allow them time together. Chaplain will return later today.    02/06/15 1100  Clinical Encounter Type  Visited With Patient and family together  Visit Type Spiritual support;Patient actively dying  Referral From Family  Spiritual Encounters  Spiritual Needs Emotional;Grief support;Prayer  Stress Factors  Family Stress Factors Loss

## 2015-02-06 NOTE — Progress Notes (Signed)
Pt having jerking movements, day shift RN said he had had some during her shift.  2mg  ativan given.  Will continue to monitor. Sherald Barge

## 2015-02-06 NOTE — Consult Note (Signed)
Consultation Note Date: 02/06/2015   Patient Name: Jonathon Galvan  DOB: 02-26-1946  MRN: [id number]  Age / Sex: 69 y.o., male   PCP: Quitman Livings, MD Referring Physician: Lupita Leash, MD  Reason for Consultation: Disposition, Establishing goals of care, Non pain symptom management, Pain control, Psychosocial/spiritual support and Terminal care  Palliative Care Assessment and Plan Summary of Established Goals of Care and Medical Treatment Preferences    Palliative Care Discussion Held Today:     This NP Lorinda Creed reviewed medical records, received report from team, assessed the patient and then meet at the patient's bedside along with his sister Benicio Larranaga to discuss diagnosis, prognosis, GOC, EOL wishes disposition and options.   Values and goals of care important to patient and family were attempted to be elicited.  Natural trajectory and expectations at EOL were discussed.  Questions and concerns addressed.  Hard Choices booklet left for review. Family encouraged to call with questions or concerns.  PMT will continue to support holistically.   Contacts/Participants in Discussion: sister at bedside Primary Decision Maker:  Sister Chiquetta and her daughter  Goals of Care/Code Status/Advance Care Planning:  Full comfort approach, enhancing comfort and dignity is main focus of care  Symptom Management:    Pain/ Dyspnea:  Noted myoclonus 2/2 to high doses of opioids              -decreased rate from 12 to 2 mg hr/ consider opioid rotation (f/u at 1500 myoclonus has decreased significantly) will not                 rotate opioid at this time               -Ativan 2 mg every     Terminal secretions: Robinul 0.4 mg IV every 6 hrs     Fever: Tylenol supp 650 mg pr every 6 hrs prn   Psycho-social/Spiritual:   Support System: sister at bedsdie  Desire for further Chaplaincy support:yes  Prognosis/Disposition:  Expect hospital death, likely hours, O2 SATs currently at  60% and temp 103        Chief Complaint:  cardiac arrest  History of Present Illness:  69 yo M with known history of HF, CAD and an ICD, found by EMS on 8/6 in [facility] after he called them concerned with new dyspnea. They had ROSC after 12 minutes of CPR. Intubated in ED and admitted to PCCM. Underwent hypothermia, however, after rewarming was found to have suffered severe encephalopathy, likely anoxic in setting of arrest. Poor prognosis. He was terminally extubated 8/13 and transitioned to comfort care.    Primary Diagnoses  Present on Admission:  . Cardiac arrest . Anoxic brain damage  Palliative Review of Systems:    -unable to illicit, unresponsive, actively dying    I have reviewed the medical record, interviewed the patient and family, and examined the patient. The following aspects are pertinent.  Past Medical History  Diagnosis Date  . CHF (congestive heart failure)     EF 30-35%, nonischemic  . CAD (coronary artery disease)   . Hyperlipidemia     hypertriglyceridemia on niaspan and lipitor.  . Asthma   . Allergy   . PVD (peripheral vascular disease)     s/p left AKA @ [facility] (MRSA septic arthritis)  . Diabetes mellitus     dx in 2009, DKA, came unresponsive  . Tobacco abuse   . Hepatitis C   . Hypertension   .  LBBB (left bundle branch block)   . Anemia of chronic disease     baseline Hgb now 13  . Thrombocytopenia     07/2007, went down to 90,000. likely 2/2 neurontin.  . Cocaine use     s/p cardiopulmonary arrest 12/25/2002 ([facility])  . Cholelithiasis     documented on CT abdomen in 07/2007  . Non-ischemic cardiomyopathy 04/2014    BiV-ICD placed   Social History   Social History  . Marital Status: Single    Spouse Name: N/A  . Number of Children: N/A  . Years of Education: N/A   Social History Main Topics  . Smoking status: Current Some Day Smoker -- 3 years  . Smokeless tobacco: None  . Alcohol Use: No  . Drug Use: No  . Sexual Activity:  Not Asked   Other Topics Concern  . None   Social History Narrative    Patient was given a prescription of zoster vaccine on 09/16/2010   Family History  Problem Relation Age of Onset  . Coronary artery disease Mother     died in her 44's  . Cirrhosis Father     died from complications  . Coronary artery disease Brother     died of fatal MI   Scheduled Meds: . atropine  2 drop Sublingual QID   Continuous Infusions: . morphine 12.5 mg/hr (02/06/15 0750)   PRN Meds:.sodium chloride, LORazepam, morphine, sodium chloride Medications Prior to Admission:  Prior to Admission medications   Medication Sig Start Date End Date Taking? Authorizing Provider  amLODipine (NORVASC) 10 MG tablet Take 10 mg by mouth daily. 12/28/14  Yes Historical Provider, MD  aspirin EC 81 MG tablet Take 1 tablet (81 mg total) by mouth daily. 05/21/14  Yes Nishant Dhungel, MD  atorvastatin (LIPITOR) 10 MG tablet Take 1 tablet (10 mg total) by mouth daily. 10/03/11  Yes Lars Mage, MD  carvedilol (COREG) 12.5 MG tablet Take 2 tablets (25 mg total) by mouth 2 (two) times daily with a meal. 05/21/14  Yes Nishant Dhungel, MD  cetirizine (ZYRTEC) 10 MG tablet Take 1 tablet (10 mg total) by mouth daily. 10/03/11  Yes Lars Mage, MD  enalapril (VASOTEC) 2.5 MG tablet Take 1 tablet (2.5 mg total) by mouth daily. 05/21/14  Yes Nishant Dhungel, MD  furosemide (LASIX) 80 MG tablet Take 0.5 tablets (40 mg total) by mouth 2 (two) times daily. 05/21/14  Yes Nishant Dhungel, MD  gabapentin (NEURONTIN) 300 MG capsule Take 300 mg by mouth 3 (three) times daily.   Yes Historical Provider, MD  glipiZIDE (GLUCOTROL) 10 MG tablet Take 1 tablet by mouth daily before breakfast.  08/11/13  Yes Historical Provider, MD  isosorbide-hydrALAZINE (BIDIL) 20-37.5 MG per tablet Take 2 tablets by mouth 2 (two) times daily. 05/21/14  Yes Nishant Dhungel, MD  metFORMIN (GLUCOPHAGE) 1000 MG tablet Take 1 tablet (1,000 mg total) by mouth 2 (two) times  daily with a meal. 05/14/11  Yes Lars Mage, MD  amitriptyline (ELAVIL) 100 MG tablet Take 100 mg by mouth at bedtime.    Historical Provider, MD  niacin (NIASPAN) 500 MG CR tablet Take 500 mg by mouth at bedtime.  08/11/13   Historical Provider, MD  oxyCODONE-acetaminophen (PERCOCET) 10-325 MG per tablet Take 1 tablet by mouth every 12 (twelve) hours as needed for pain. 11/12/14   Shanker Levora Dredge, MD  venlafaxine (EFFEXOR) 50 MG tablet Take 1 tablet (50 mg total) by mouth at bedtime. Patient not taking: Reported on  01/28/2015 10/03/11   Lars Mage, MD  zolpidem (AMBIEN) 10 MG tablet Take 10 mg by mouth at bedtime as needed for sleep.    Historical Provider, MD   No Known Allergies CBC:    Component Value Date/Time   WBC 12.7* 02/02/2015 0420   HGB 11.4* 02/02/2015 0420   HCT 35.6* 02/02/2015 0420   PLT 190 02/02/2015 0420   MCV 81.7 02/02/2015 0420   NEUTROABS 6.9 01/31/2015 0412   LYMPHSABS 2.7 01/31/2015 0412   MONOABS 1.4* 01/31/2015 0412   EOSABS 0.4 01/31/2015 0412   BASOSABS 0.0 01/31/2015 0412   Comprehensive Metabolic Panel:    Component Value Date/Time   NA 146* 02/02/2015 0420   K 4.2 02/02/2015 0420   CL 113* 02/02/2015 0420   CO2 22 02/02/2015 0420   BUN 35* 02/02/2015 0420   CREATININE 3.63* 02/02/2015 0420   CREATININE 1.13 09/16/2010 1518   GLUCOSE 128* 02/02/2015 0420   CALCIUM 8.4* 02/02/2015 0420   AST 30 01/28/2015 0532   ALT 17 01/28/2015 0532   ALKPHOS 58 01/28/2015 0532   BILITOT 1.8* 01/28/2015 0532   PROT 5.9* 01/28/2015 0532   ALBUMIN 2.8* 01/28/2015 0532    Physical Exam:  Vital Signs: BP 186/171 mmHg  Pulse 107  Temp(Src) 103.1 F (39.5 C) (Axillary)  Resp 18  Ht 6' (1.829 m)  Wt 85.7 kg (188 lb 15 oz)  BMI 25.62 kg/m2  SpO2 84% SpO2: SpO2: (!) 84 % O2 Device: O2 Device: Nasal Cannula O2 Flow Rate:   Intake/output summary:  Intake/Output Summary (Last 24 hours) at 02/06/15 1040 Last data filed at 02/06/15 0654  Gross per 24 hour    Intake     40 ml  Output   1550 ml  Net  -1510 ml   LBM:   Baseline Weight: Weight: 83.399 kg (183 lb 13.8 oz) Most recent weight: Weight: 85.7 kg (188 lb 15 oz)  Exam Findings:   General: unresponsive, actively dying HEENT: moist buccal membranes, right lip droop CVS: tachycardic Skin: +warm, temp 103          Palliative Performance Scale: 10 %                Additional Data Reviewed: No results for input(s): WBC, HGB, PLT, NA, BUN, CREATININE, ALB in the last 72 hours.   Time In: 0945 Time Out: 1100 Time Total: 75 min  Greater than 50%  of this time was spent counseling and coordinating care related to the above assessment and plan.  Discussed with Rhodia Albright and bedside nurse  Signed by: Lorinda Creed, NP  Canary Brim, NP  02/06/2015, 10:40 AM  Please contact Palliative Medicine Team phone at [phone] for questions and concerns.   See AMION for contact information

## 2015-02-06 NOTE — Progress Notes (Addendum)
Wasted Morphine IV 30cc in sink. Witnessed by Jacqualine Code, RN. Malen Gauze, RN  Witnessed waste of Morphine IV 30cc in sink by Malen Gauze, RN   -  Jacqualine Code, RN

## 2015-02-06 NOTE — Progress Notes (Signed)
PULMONARY / CRITICAL CARE MEDICINE   Name: Jonathon Galvan MRN: [id number] DOB: 1945-10-31    ADMISSION DATE:  02-04-15 TODAY'S DATE: 01/31/2015  REFERRING MD :  EDP  CHIEF COMPLAINT:  Cardiac arrest   Brief Patient Description : 69 yo M with known history of HF, CAD and an ICD, found by EMS on 8/6 in [facility] after he called them concerned with new dyspnea. They had ROSC after 12 minutes of CPR. Intubated in ED and admitted to PCCM. Underwent hypothermia, however, after rewarming was found to have suffered severe encephalopathy, likely anoxic in setting of arrest. Poor prognosis. He was terminally extubated 8/13 and transitioned to comfort care.   STUDIES:  8/7 Head CT > small vessel disease, no acute findings 8/7 EEG > abnormal due to lack of electro-cerebral activity (performed on sedation, while cool) 8/9 Echo > LVEF 20-25% 8/9 EEG > burst suppression indicative of severe encephalopathy, likely anoxic. Poor prognosis. (performed rewarmed)  SIGNIFICANT EVENTS: 8/6 Cardiac arrest 8/6 intubated and admitted to [facility] 8/7 rewarmed, sedation back on for vent synchrony 8/9 EEG showing poor prognosis, family notified, they are discussing withdrawal of care  8/13 one way extubation > comfort  LINES/DRAINS: L IJ CVL 8/7 >>> R Radial Arterial Line 8/7 > out  SUBJECTIVE: Patient remains unresponsive on a morphine drip at this time. Nurse reports the patient has been having intermittent jerking movements but nothing purposeful. No family at bedside this morning. Patient remained hypertensive and hypoxic. He is having intermittent fever.  ROS: Patient is nonresponsive therefore review of systems unobtainable.  VITAL SIGNS: Temp:  [103.1 F (39.5 C)] 103.1 F (39.5 C) (08/16 0648) Pulse Rate:  [107] 107 (08/16 0648) Resp:  [18] 18 (08/16 0648) BP: (186)/(171) 186/171 mmHg (08/16 0648) SpO2:  [84 %] 84 % (08/16 0648) FiO2 (%):  [3 %] 3 % (08/16 0648)   HEMODYNAMICS:   VENTILATOR  SETTINGS: Vent Mode:  [-]  FiO2 (%):  [3 %] 3 % INTAKE / OUTPUT:  Intake/Output Summary (Last 24 hours) at 02/06/15 0932 Last data filed at 02/06/15 0654  Gross per 24 hour  Intake     40 ml  Output   1550 ml  Net  -1510 ml   PHYSICAL EXAMINATION: General:  Comatose. Nonresponsive. No distress.  Integument:  Warm & dry. No rash on exposed skin.  HEENT:  Moist mucus membranes. Pupils equal. Cardiovascular:  Regular rate. No appreciable JVD.  Pulmonary:  Symmetric chest wall rise. Normal work of breathing on nasal cannula oxygen. Abdomen: Soft. Nondistended.  Neurological:  Patient with intermittent myoclonic jerking movements. Doesn't follow commands. Mouth agape.   LABS:  CBC  Recent Labs Lab 01/31/15 0412 02/01/15 0330 02/02/15 0420  WBC 11.4* 10.2 12.7*  HGB 11.8* 11.4* 11.4*  HCT 35.7* 35.9* 35.6*  PLT 169 171 190   Coag's No results for input(s): APTT, INR in the last 168 hours. BMET  Recent Labs Lab 01/31/15 0412 02/01/15 0330 02/02/15 0420  NA 145 146* 146*  K 3.7 3.6 4.2  CL 113* 112* 113*  CO2 22 24 22   BUN 27* 31* 35*  CREATININE 4.09* 4.00* 3.63*  GLUCOSE 135* 131* 128*   Electrolytes  Recent Labs Lab 01/31/15 0412 02/01/15 0330 02/02/15 0420  CALCIUM 8.2* 8.4* 8.4*  MG  --  2.2 2.4  PHOS  --  5.1* 5.7*   Sepsis Markers No results for input(s): LATICACIDVEN, PROCALCITON, O2SATVEN in the last 168 hours. ABG  Recent Labs Lab 02/01/15 0500  02/02/15 0544  PHART 7.332* 7.427  PCO2ART 44.5 34.2*  PO2ART 118* 74.6*   Liver Enzymes No results for input(s): AST, ALT, ALKPHOS, BILITOT, ALBUMIN in the last 168 hours. Cardiac Enzymes No results for input(s): TROPONINI, PROBNP in the last 168 hours. Glucose  Recent Labs Lab 02/01/15 1239 02/01/15 1725 02/01/15 2033 02/02/15 0050 02/02/15 0433 02/02/15 0802  GLUCAP 123* 117* 124* 111* 128* 112*   Imaging    ASSESSMENT / PLAN: 69 year old male status post [facility] cardiac arrest  with anoxic brain injury and ongoing myoclonic jerking. Known history of chronic kidney disease, nonischemic cardiomyopathy, & diabetes mellitus type 2. Patient terminally extubated on 8/13 after discussion with family and placed on comfort care. Patient continuing to have intermittent fever and persistent hypoxia. He is requiring more Ativan for control of his myoclonic jerking but his respiratory status appears comfortable at this time. I personally spoke with the palliative medicine team who were coming today to assess the patient.  1. Continuing morphine infusion for dyspnea. Spoke with patient's nurse today who has adjusted the patient's infusion drip rate with the intermittent boluses he has needed. 2. Increasing Ativan to 1-4 milligrams IV every hour when necessary anxiety, seizure, and myoclonic jerking. 3. Sublingual atropine is available for oral secretions but has not been needed. 4. Disposition: If patient continues in this manner he may be a good candidate for a hospice facility. I will defer to the recommendations from the palliative medicine service.  Donna Christen Jamison Neighbor, M.D. [facility] Pulmonary & Critical Care Pager:  [phone] After 3pm or if no response, call [phone]  02/06/2015, 9:32 AM

## 2015-02-06 NOTE — Progress Notes (Signed)
Pt continuing to have jerking movements.  Pt has had multiple morphine blous' and 2 doses of ativan.  Currently is not due for anymore ativan.  Will page MD to make aware. Sherald Barge

## 2015-02-07 DIAGNOSIS — Z515 Encounter for palliative care: Secondary | ICD-10-CM

## 2015-02-07 DIAGNOSIS — J9601 Acute respiratory failure with hypoxia: Secondary | ICD-10-CM | POA: Insufficient documentation

## 2015-02-07 DIAGNOSIS — R0600 Dyspnea, unspecified: Secondary | ICD-10-CM | POA: Insufficient documentation

## 2015-02-07 NOTE — Progress Notes (Signed)
Daily Progress Note   Patient Name: Jonathon Galvan       Date: 02/07/2015 DOB: 03/27/1946  Age: 69 y.o. MRN#: [id number] Attending Physician: Lupita Leash, MD Primary Care Physician: Quitman Livings, MD Admit Date: 02/13/2015  Reason for Consultation/Follow-up: Terminal care, psycho-social support for famiily  Subjective:     -patient is unresponsive, discussion with bedside nurse regarding AICD deactivation, [employer] notified of need  -focus of care is comfort, continue with symptom managment   Length of Stay: 11 days  Current Medications: Scheduled Meds:  . glycopyrrolate  0.4 mg Intravenous QID    Continuous Infusions: . morphine 2 mg/hr (02/06/15 1516)    PRN Meds: sodium chloride, acetaminophen, LORazepam, sodium chloride  Palliative Performance Scale: 10 %, actively dying     Vital Signs: BP 127/60 mmHg  Pulse 96  Temp(Src) 98.2 F (36.8 C) (Axillary)  Resp 18  Ht 6' (1.829 m)  Wt 85.7 kg (188 lb 15 oz)  BMI 25.62 kg/m2  SpO2 64% SpO2: SpO2: (!) 64 % O2 Device: O2 Device: Not Delivered O2 Flow Rate:    Intake/output summary:  Intake/Output Summary (Last 24 hours) at 02/07/15 1544 Last data filed at 02/07/15 1300  Gross per 24 hour  Intake     40 ml  Output      0 ml  Net     40 ml   LBM:   Baseline Weight: Weight: 83.399 kg (183 lb 13.8 oz) Most recent weight: Weight: 85.7 kg (188 lb 15 oz)  Physical Exam:              General: unresponsive, actively dying CVS: tachycardic Resp: decreased in bases, periods of apnea    Additional Data Reviewed: No results for input(s): WBC, HGB, PLT, NA, BUN, CREATININE, ALB in the last 72 hours.   Problem List:  Patient Active Problem List   Diagnosis Date Noted  . Palliative care encounter 02/07/2015  . Acute respiratory failure with hypoxemia   . Dyspnea   . Opiate overdose 11/11/2014  . Anoxic brain damage 05/21/2014  . Cardiac defibrillator in place   . Fever presenting with conditions  classified elsewhere   . Other chest pain   . Acute kidney injury   . Acute delirium 05/16/2014  . Acute respiratory failure with hypoxia 05/16/2014  . Ventricular fibrillation   . HCAP (healthcare-associated pneumonia)   . MRSA pneumonia   . Chronic hepatitis C without hepatic coma   . Aspiration pneumonia   . VF (ventricular fibrillation) 05/06/2014  . Nonischemic cardiomyopathy 05/06/2014  . Hx of AKA (above knee amputation) 05/06/2014  . Cardiac arrest   . Insomnia 09/26/2010  . Chronic leg pain 09/16/2010  . Cellulitis of leg, right 09/03/2010  . CAD 05/20/2010  . CHF 09/23/2007  . THROMBOCYTOPENIA 08/26/2007  . DM2 (diabetes mellitus, type 2) 08/10/2007  . TOBACCO USE 02/23/2007  . Peripheral vascular disease 02/23/2007  . ALLERGIC RHINITIS 05/13/2006  . Chronic hepatitis C 04/22/2006  . DYSLIPIDEMIA 04/22/2006  . Essential hypertension 04/22/2006  . ASTHMA 04/22/2006     Palliative Care Assessment & Plan    Code Status:  DNR  Goals of Care:   Focus of care is comfort    3. Symptom Management:  Pain/ Dyspnea: Noted myoclonus 2/2 to high doses of opioids  - Morphine 2 mg hr/  Appears comfortable  -Ativan 2 mg every 4 hrs prn  Terminal secretions: Robinul 0.4 mg IV every 6 hrs  Fever: Tylenol supp 650  mg pr every 6 hrs prn  4. Prognosis: hours, expect hospital death   Care plan was discussed with Dr Jamison Neighbor and Okey Regal RN  Thank you for allowing the Palliative Medicine Team to assist in the care of this patient.   Time In: 0935 Time Out: 0955 Total Time 20 min Prolonged Time Billed  no    Greater than 50%  of this time was spent counseling and coordinating care related to the above assessment and plan.     Canary Brim, NP  02/07/2015, 3:44 PM  Please contact Palliative Medicine Team phone at [phone] for questions and concerns.

## 2015-02-07 NOTE — Progress Notes (Signed)
Spoke with Jonathon Galvan with Hospice and Palliative services to evaluate for possible GIP candidate.  She states she will review pt clinicals and get back to me later today.    Will follow.   Quintella Baton, RN, BSN  Trauma/Neuro ICU Case Manager [phone]

## 2015-02-07 NOTE — Progress Notes (Signed)
Called [employer] (ICD vendor) this am to verify if pt's ICD has been turned off. Talked to Satira Mccallum and said she didn't get any call to turn it off but will make sure it will be turn off today.

## 2015-02-07 NOTE — Progress Notes (Signed)
PULMONARY / CRITICAL CARE MEDICINE   Name: Jonathon Galvan MRN: [id number] DOB: [birth year]    ADMISSION DATE:  02/16/2015 TODAY'S DATE: 01/31/2015  REFERRING MD :  EDP  CHIEF COMPLAINT:  Cardiac arrest   Brief Patient Description : 69 yo M with known history of HF, CAD and an ICD, found by EMS on 8/6 in [facility] after he called them concerned with new dyspnea. They had ROSC after 12 minutes of CPR. Intubated in ED and admitted to PCCM. Underwent hypothermia, however, after rewarming was found to have suffered severe encephalopathy, likely anoxic in setting of arrest. Poor prognosis. He was terminally extubated 8/13 and transitioned to comfort care.   STUDIES:  8/7 Head CT > small vessel disease, no acute findings 8/7 EEG > abnormal due to lack of electro-cerebral activity (performed on sedation, while cool) 8/9 Echo > LVEF 20-25% 8/9 EEG > burst suppression indicative of severe encephalopathy, likely anoxic. Poor prognosis. (performed rewarmed)  SIGNIFICANT EVENTS: 8/6 Cardiac arrest 8/6 intubated and admitted to [facility] 8/7 rewarmed, sedation back on for vent synchrony 8/9 EEG showing poor prognosis, family notified, they are discussing withdrawal of care  8/13 one way extubation > comfort  LINES/DRAINS: L IJ CVL 8/7 >>> R Radial Arterial Line 8/7 > out  SUBJECTIVE: Palliative medicine has assessed the patient. It's unclear whether or not his defibrillator was deactivated from documentation & this will be confirmed by [employer] today. Continuing to have intermittent myoclonus. Overall appears relatively comfortable.  ROS: Patient is nonresponsive therefore review of systems unobtainable.  VITAL SIGNS: Temp:  [98.2 F (36.8 C)-102.9 F (39.4 C)] 98.2 F (36.8 C) (08/17 0539) Pulse Rate:  [96-104] 96 (08/17 0539) Resp:  [18-24] 18 (08/17 0539) BP: (121-127)/(50-60) 127/60 mmHg (08/17 0539) SpO2:  [62 %-64 %] 64 % (08/17 0539)   HEMODYNAMICS:   VENTILATOR SETTINGS:   INTAKE /  OUTPUT:  Intake/Output Summary (Last 24 hours) at 02/07/15 1104 Last data filed at 02/07/15 0800  Gross per 24 hour  Intake     40 ml  Output      0 ml  Net     40 ml   PHYSICAL EXAMINATION: General:  Comatose. Nonresponsive. No acute distress.  Integument:  Warm & dry. No rash on exposed skin.  HEENT:  Moist mucus membranes. Eyes closed. Cardiovascular:  Regular rate. No appreciable JVD.  Pulmonary:  Symmetric chest wall rise. Normal work of breathing on nasal cannula oxygen. Abdomen: Soft. Nondistended.  Neurological:  Patient with intermittent myoclonic jerking movements. Doesn't follow commands.   LABS:  CBC  Recent Labs Lab 02/01/15 0330 02/02/15 0420  WBC 10.2 12.7*  HGB 11.4* 11.4*  HCT 35.9* 35.6*  PLT 171 190   Coag's No results for input(s): APTT, INR in the last 168 hours. BMET  Recent Labs Lab 02/01/15 0330 02/02/15 0420  NA 146* 146*  K 3.6 4.2  CL 112* 113*  CO2 24 22  BUN 31* 35*  CREATININE 4.00* 3.63*  GLUCOSE 131* 128*   Electrolytes  Recent Labs Lab 02/01/15 0330 02/02/15 0420  CALCIUM 8.4* 8.4*  MG 2.2 2.4  PHOS 5.1* 5.7*   Sepsis Markers No results for input(s): LATICACIDVEN, PROCALCITON, O2SATVEN in the last 168 hours. ABG  Recent Labs Lab 02/01/15 0500 02/02/15 0544  PHART 7.332* 7.427  PCO2ART 44.5 34.2*  PO2ART 118* 74.6*   Liver Enzymes No results for input(s): AST, ALT, ALKPHOS, BILITOT, ALBUMIN in the last 168 hours. Cardiac Enzymes No results for input(s): TROPONINI,  PROBNP in the last 168 hours. Glucose  Recent Labs Lab 02/01/15 1239 02/01/15 1725 02/01/15 2033 02/02/15 0050 02/02/15 0433 02/02/15 0802  GLUCAP 123* 117* 124* 111* 128* 112*   Imaging    ASSESSMENT / PLAN: 69 year old male status post [facility] cardiac arrest with anoxic brain injury and ongoing myoclonic jerking. Known history of chronic kidney disease, nonischemic cardiomyopathy, & diabetes mellitus type 2. Patient terminally extubated on  8/13 after discussion with family and placed on comfort care. Patient continuing to have intermittent fever and persistent hypoxia. Confirming that the patient's defibrillator has been deactivated today. Prognosis remains dismal and patient continuing full comfort care. Appreciate the assistance of the palliative medicine team.  1. Continuing morphine infusion for dyspnea with the intermittent boluses he has needed. 2. Ativan to 1-4 milligrams IV every hour when necessary anxiety, seizure, and myoclonic jerking. 3. Sublingual atropine is available for oral secretions but has not been needed. 4. Disposition: If patient continues in this manner he may be a good candidate for a hospice facility. I will defer to the recommendations from the palliative medicine service.  Donna Christen Jamison Neighbor, M.D. [facility] Pulmonary & Critical Care Pager:  [phone] After 3pm or if no response, call [phone]  02/07/2015, 11:04 AM

## 2015-02-08 NOTE — Progress Notes (Signed)
PULMONARY / CRITICAL CARE MEDICINE   Name: Jonathon Galvan MRN: [id number] DOB: June 26, 1945    ADMISSION DATE:  24-Feb-2015 TODAY'S DATE: 01/31/2015  REFERRING MD :  EDP  CHIEF COMPLAINT:  Cardiac arrest   Brief Patient Description : 69 yo M with known history of HF, CAD and an ICD, found by EMS on 8/6 in [facility] after he called them concerned with new dyspnea. They had ROSC after 12 minutes of CPR. Intubated in ED and admitted to PCCM. Underwent hypothermia, however, after rewarming was found to have suffered severe encephalopathy, likely anoxic in setting of arrest. Poor prognosis. He was terminally extubated 8/13 and transitioned to comfort care.   STUDIES:  8/7 Head CT > small vessel disease, no acute findings 8/7 EEG > abnormal due to lack of electro-cerebral activity (performed on sedation, while cool) 8/9 Echo > LVEF 20-25% 8/9 EEG > burst suppression indicative of severe encephalopathy, likely anoxic. Poor prognosis. (performed rewarmed)  SIGNIFICANT EVENTS: 8/6 Cardiac arrest 8/6 intubated and admitted to [facility] 8/7 rewarmed, sedation back on for vent synchrony 8/9 EEG showing poor prognosis, family notified, they are discussing withdrawal of care  8/13 one way extubation > comfort  LINES/DRAINS: L IJ CVL 8/7 >>> R Radial Arterial Line 8/7 > out  SUBJECTIVE: RR slightly increased this am.  Still with intermittent myoclonus.    ROS: Patient is nonresponsive therefore review of systems unobtainable.  VITAL SIGNS: Temp:  [100.8 F (38.2 C)] 100.8 F (38.2 C) (08/18 0555) Pulse Rate:  [81] 81 (08/18 0555) Resp:  [24] 24 (08/18 0555) BP: (134)/(92) 134/92 mmHg (08/18 0555) SpO2:  [70 %] 70 % (08/18 0555)     INTAKE / OUTPUT:  Intake/Output Summary (Last 24 hours) at 02/08/15 0931 Last data filed at 02/08/15 9924  Gross per 24 hour  Intake      0 ml  Output    425 ml  Net   -425 ml   PHYSICAL EXAMINATION: General:  Comatose. Nonresponsive. No acute distress.   Integument:  Warm & dry. No rash on exposed skin.  HEENT:  Dry mucus membranes. Eyes closed. Cardiovascular:  Regular rate. No appreciable JVD.  Pulmonary:  Symmetric chest wall rise. Normal work of breathing, slightly tachypneic.  Abdomen: Soft. Nondistended.  Neurological:  Patient with intermittent myoclonic jerking movements. Doesn't follow commands.   LABS:  CBC  Recent Labs Lab 02/02/15 0420  WBC 12.7*  HGB 11.4*  HCT 35.6*  PLT 190   Coag's No results for input(s): APTT, INR in the last 168 hours. BMET  Recent Labs Lab 02/02/15 0420  NA 146*  K 4.2  CL 113*  CO2 22  BUN 35*  CREATININE 3.63*  GLUCOSE 128*   Electrolytes  Recent Labs Lab 02/02/15 0420  CALCIUM 8.4*  MG 2.4  PHOS 5.7*   Sepsis Markers No results for input(s): LATICACIDVEN, PROCALCITON, O2SATVEN in the last 168 hours. ABG  Recent Labs Lab 02/02/15 0544  PHART 7.427  PCO2ART 34.2*  PO2ART 74.6*   Liver Enzymes No results for input(s): AST, ALT, ALKPHOS, BILITOT, ALBUMIN in the last 168 hours. Cardiac Enzymes No results for input(s): TROPONINI, PROBNP in the last 168 hours. Glucose  Recent Labs Lab 02/01/15 1239 02/01/15 1725 02/01/15 2033 02/02/15 0050 02/02/15 0433 02/02/15 0802  GLUCAP 123* 117* 124* 111* 128* 112*   Imaging    ASSESSMENT / PLAN: 69 year old male status post [facility] cardiac arrest with anoxic brain injury and ongoing myoclonic jerking. Known history of chronic kidney  disease, nonischemic cardiomyopathy, & diabetes mellitus type 2. Patient terminally extubated on 8/13 after discussion with family and placed on comfort care. Patient continuing to have intermittent fever and persistent hypoxia. Continue full comfort care.  Appreciate palliative care assistance.  PLAN- 1. Continuing morphine infusion for dyspnea - d/w nurse to increase  2. Ativan to 1-4 milligrams IV every hour when necessary anxiety, seizure, and myoclonic jerking. 3. Sublingual atropine  is available for oral secretions but has not been needed. 4. Disposition: anticipate hospital death.   Dirk Dress, NP February 27, 2015  9:31 AM Pager: [phone] or [phone]

## 2015-02-12 ENCOUNTER — Telehealth: Payer: Self-pay

## 2015-02-12 NOTE — Telephone Encounter (Signed)
On 02/12/2015 I received a death certificate from [employer] The death certificate is for cremation. The patient is a patient of [employer]. The death certificate will be taken to the pulmonary unit tomorrow am for signature. On 02-22-15 I received the death certificate from [facility] [place]. I got the death certificate ready and called the funeral home to let them know the death certificate was ready for pickup.

## 2015-02-13 NOTE — Telephone Encounter (Signed)
This note was entered by mistake. °

## 2015-02-19 ENCOUNTER — Telehealth: Payer: Self-pay

## 2015-02-19 NOTE — Telephone Encounter (Signed)
On 02/19/2015 I received a death certificate from [employer]. The death certificate has already been signed but there is a problem regarding the death certificate. The death certificate will be taken to the pulmonary unit for signature this pm for correction. On Mar 18, 2015 I received the death certificate back from [facility] [place]. I got the death certificate ready and called Dennis Bast at the health dept to let her know it was ready for pickup.

## 2015-02-22 NOTE — Progress Notes (Signed)
Upon morning assessment, patient had no pulse and was not breathing.  This was confirmed at bedside by Dr. Jamison Neighbor. Time of death 89.  Patient's sister was notified and instructed RN to also notify patient's niece. This was done. Family wishes to see patient at bedside before he is transported to the morgue. Remaining Morphine drip ( ) was wasted in sink with [name], RN. CDS notified and patient is not suitable for organ donation.  Patient's body prepared and transported to [place].  Patient's niece, Jonathon Galvan is making phone calls regarding funeral home options and will notify me when she makes a decision. I will let bed placement know.

## 2015-02-22 NOTE — Discharge Summary (Signed)
Death Note: For a complete accounting of the patient's presentation on admission refer to the admission H&P.  Patient was admitted on 02/14/2015 after suffering a cardiac arrest with a urine drug screen positive for Cocaine.  Therapeutic hypothermia protocol was initiated.  As his course progressed he was found to have profound anoxic brain injury and was evaluated by Neurology confirming this.  Family discussion was held by the intensivist and the decision was made to perform a terminal extubation and place the patient on full comfort care measures.  He was extubated on 8/13.  Palliative care was consulted for assistance in managing this patient's ongoing palliative issues.  Patient was maintained on a morphine infusion for work of breathing and IV ativan intermittently for myoclonic jerking.  I was at beside to evaluate the patient and found him to be without any pulse or spontaneous respirations.  I pronounced him dead at 8:47am on 02/20/15.  Diagnoses at Death: 1.  Acute Hypoxic Respiratory Failure 2.  Severe Anoxic Brain Injury 3.  Pulseless Electrical Activity Cardiac Arrest 4.  Cocaine Drug Use 5.  Nonischemic Cardiomyopathy 6.  Chronic Kidney Disease 7.  Diabetes Mellitus Type 2 8.  Asthma 9.  Hyperlipidemia 10.  Hypertension 11.  Hepatitis C Viral Infection 12.  Nonobstructive Coronary Artery Disease 13.  Anemia of Chronic Disease

## 2015-02-22 DEATH — deceased
